# Patient Record
Sex: Male | Born: 1960 | Race: White | Hispanic: No | Smoking: Current every day smoker
Health system: Southern US, Community
[De-identification: ages and names within clinical notes are randomized; demographics above are authoritative.]

## PROBLEM LIST (undated history)

## (undated) DIAGNOSIS — G4733 Obstructive sleep apnea (adult) (pediatric): Secondary | ICD-10-CM

## (undated) DIAGNOSIS — M1990 Unspecified osteoarthritis, unspecified site: Secondary | ICD-10-CM

## (undated) DIAGNOSIS — K5730 Diverticulosis of large intestine without perforation or abscess without bleeding: Secondary | ICD-10-CM

## (undated) DIAGNOSIS — N200 Calculus of kidney: Secondary | ICD-10-CM

## (undated) DIAGNOSIS — D509 Iron deficiency anemia, unspecified: Secondary | ICD-10-CM

## (undated) DIAGNOSIS — H16002 Unspecified corneal ulcer, left eye: Secondary | ICD-10-CM

## (undated) DIAGNOSIS — F419 Anxiety disorder, unspecified: Secondary | ICD-10-CM

## (undated) DIAGNOSIS — F319 Bipolar disorder, unspecified: Secondary | ICD-10-CM

## (undated) DIAGNOSIS — M51369 Other intervertebral disc degeneration, lumbar region without mention of lumbar back pain or lower extremity pain: Secondary | ICD-10-CM

## (undated) DIAGNOSIS — K529 Noninfective gastroenteritis and colitis, unspecified: Secondary | ICD-10-CM

## (undated) DIAGNOSIS — D3501 Benign neoplasm of right adrenal gland: Secondary | ICD-10-CM

## (undated) DIAGNOSIS — F329 Major depressive disorder, single episode, unspecified: Secondary | ICD-10-CM

## (undated) DIAGNOSIS — R933 Abnormal findings on diagnostic imaging of other parts of digestive tract: Secondary | ICD-10-CM

## (undated) DIAGNOSIS — M2550 Pain in unspecified joint: Secondary | ICD-10-CM

## (undated) DIAGNOSIS — K219 Gastro-esophageal reflux disease without esophagitis: Secondary | ICD-10-CM

## (undated) DIAGNOSIS — L732 Hidradenitis suppurativa: Secondary | ICD-10-CM

## (undated) DIAGNOSIS — M5136 Other intervertebral disc degeneration, lumbar region: Secondary | ICD-10-CM

## (undated) DIAGNOSIS — I739 Peripheral vascular disease, unspecified: Secondary | ICD-10-CM

## (undated) DIAGNOSIS — I82409 Acute embolism and thrombosis of unspecified deep veins of unspecified lower extremity: Secondary | ICD-10-CM

## (undated) DIAGNOSIS — Z87898 Personal history of other specified conditions: Secondary | ICD-10-CM

## (undated) DIAGNOSIS — F32A Depression, unspecified: Secondary | ICD-10-CM

## (undated) DIAGNOSIS — J984 Other disorders of lung: Secondary | ICD-10-CM

## (undated) DIAGNOSIS — R350 Frequency of micturition: Secondary | ICD-10-CM

## (undated) DIAGNOSIS — R3915 Urgency of urination: Secondary | ICD-10-CM

## (undated) DIAGNOSIS — Z8042 Family history of malignant neoplasm of prostate: Secondary | ICD-10-CM

## (undated) DIAGNOSIS — M62838 Other muscle spasm: Secondary | ICD-10-CM

## (undated) DIAGNOSIS — J189 Pneumonia, unspecified organism: Secondary | ICD-10-CM

## (undated) DIAGNOSIS — N400 Enlarged prostate without lower urinary tract symptoms: Secondary | ICD-10-CM

## (undated) DIAGNOSIS — C801 Malignant (primary) neoplasm, unspecified: Secondary | ICD-10-CM

## (undated) HISTORY — DX: Abnormal findings on diagnostic imaging of other parts of digestive tract: R93.3

## (undated) HISTORY — DX: Bipolar disorder, unspecified: F31.9

## (undated) HISTORY — DX: Personal history of other specified conditions: Z87.898

## (undated) HISTORY — DX: Obstructive sleep apnea (adult) (pediatric): G47.33

## (undated) HISTORY — PX: ANKLE SURGERY: SHX546

## (undated) HISTORY — DX: Family history of malignant neoplasm of prostate: Z80.42

## (undated) HISTORY — DX: Depression, unspecified: F32.A

## (undated) HISTORY — DX: Unspecified corneal ulcer, left eye: H16.002

## (undated) HISTORY — DX: Other disorders of lung: J98.4

## (undated) HISTORY — PX: TONSILLECTOMY: SUR1361

## (undated) HISTORY — PX: ESOPHAGOGASTRODUODENOSCOPY: SHX1529

## (undated) HISTORY — PX: COLONOSCOPY: SHX174

## (undated) HISTORY — DX: Morbid (severe) obesity due to excess calories: E66.01

## (undated) HISTORY — PX: HYDRADENITIS EXCISION: SHX5243

## (undated) HISTORY — PX: JOINT REPLACEMENT: SHX530

## (undated) HISTORY — DX: Benign prostatic hyperplasia without lower urinary tract symptoms: N40.0

## (undated) HISTORY — DX: Noninfective gastroenteritis and colitis, unspecified: K52.9

## (undated) HISTORY — DX: Hidradenitis suppurativa: L73.2

## (undated) HISTORY — DX: Major depressive disorder, single episode, unspecified: F32.9

## (undated) HISTORY — DX: Anxiety disorder, unspecified: F41.9

## (undated) HISTORY — DX: Benign neoplasm of right adrenal gland: D35.01

## (undated) HISTORY — DX: Diverticulosis of large intestine without perforation or abscess without bleeding: K57.30

## (undated) SURGERY — VIDEO BRONCHOSCOPY WITHOUT FLUORO
Anesthesia: General

---
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
## 1986-06-02 HISTORY — PX: ANTRAL WINDOW: SHX5192

## 1995-06-03 HISTORY — PX: KNEE ARTHROSCOPY: SUR90

## 2001-06-02 HISTORY — PX: ROUX-EN-Y GASTRIC BYPASS: SHX1104

## 2002-06-02 HISTORY — PX: INGUINAL HERNIA REPAIR: SUR1180

## 2003-03-30 ENCOUNTER — Observation Stay (HOSPITAL_COMMUNITY): Admission: EM | Admit: 2003-03-30 | Discharge: 2003-03-31 | Payer: Self-pay | Admitting: Emergency Medicine

## 2004-07-22 ENCOUNTER — Emergency Department: Payer: Self-pay | Admitting: Internal Medicine

## 2005-05-30 ENCOUNTER — Emergency Department: Payer: Self-pay | Admitting: Emergency Medicine

## 2005-06-10 ENCOUNTER — Emergency Department: Payer: Self-pay | Admitting: Internal Medicine

## 2005-07-12 ENCOUNTER — Emergency Department (HOSPITAL_COMMUNITY): Admission: EM | Admit: 2005-07-12 | Discharge: 2005-07-12 | Payer: Self-pay | Admitting: Family Medicine

## 2006-03-09 ENCOUNTER — Emergency Department (HOSPITAL_COMMUNITY): Admission: EM | Admit: 2006-03-09 | Discharge: 2006-03-09 | Payer: Self-pay | Admitting: Emergency Medicine

## 2006-08-08 ENCOUNTER — Emergency Department (HOSPITAL_COMMUNITY): Admission: EM | Admit: 2006-08-08 | Discharge: 2006-08-08 | Payer: Self-pay | Admitting: Emergency Medicine

## 2007-08-10 ENCOUNTER — Ambulatory Visit: Payer: Self-pay | Admitting: Family Medicine

## 2008-07-24 ENCOUNTER — Emergency Department (HOSPITAL_COMMUNITY): Admission: EM | Admit: 2008-07-24 | Discharge: 2008-07-24 | Payer: Self-pay | Admitting: Emergency Medicine

## 2008-07-28 ENCOUNTER — Emergency Department (HOSPITAL_COMMUNITY): Admission: EM | Admit: 2008-07-28 | Discharge: 2008-07-28 | Payer: Self-pay | Admitting: Emergency Medicine

## 2008-07-28 ENCOUNTER — Ambulatory Visit: Payer: Self-pay | Admitting: Family Medicine

## 2008-09-26 ENCOUNTER — Emergency Department (HOSPITAL_COMMUNITY): Admission: EM | Admit: 2008-09-26 | Discharge: 2008-09-26 | Payer: Self-pay | Admitting: Emergency Medicine

## 2008-10-18 ENCOUNTER — Emergency Department (HOSPITAL_COMMUNITY): Admission: EM | Admit: 2008-10-18 | Discharge: 2008-10-18 | Payer: Self-pay | Admitting: Family Medicine

## 2010-09-11 LAB — POCT URINALYSIS DIP (DEVICE)
Bilirubin Urine: NEGATIVE
Hgb urine dipstick: NEGATIVE
Ketones, ur: NEGATIVE mg/dL
Protein, ur: NEGATIVE mg/dL
Specific Gravity, Urine: <=1.005 (ref 1.005–1.030)
pH: 6 (ref 5.0–8.0)

## 2010-10-18 ENCOUNTER — Emergency Department (HOSPITAL_COMMUNITY)
Admission: EM | Admit: 2010-10-18 | Discharge: 2010-10-18 | Disposition: A | Discharge status: Home / Self Care | Payer: Managed Care, Other (non HMO) | Attending: Emergency Medicine | Admitting: Emergency Medicine

## 2010-10-18 DIAGNOSIS — Y838 Other surgical procedures as the cause of abnormal reaction of the patient, or of later complication, without mention of misadventure at the time of the procedure: Secondary | ICD-10-CM | POA: Insufficient documentation

## 2010-10-18 DIAGNOSIS — IMO0002 Reserved for concepts with insufficient information to code with codable children: Secondary | ICD-10-CM | POA: Insufficient documentation

## 2010-10-18 LAB — CBC
HCT: 35.1 % — ABNORMAL LOW (ref 39.0–52.0)
Hemoglobin: 11.7 g/dL — ABNORMAL LOW (ref 13.0–17.0)
Hemoglobin: 10.9 g/dL — ABNORMAL LOW (ref 13.0–17.0)
MCH: 30.4 pg (ref 26.0–34.0)
MCHC: 33.3 g/dL (ref 30.0–36.0)
MCHC: 34.1 g/dL (ref 30.0–36.0)
MCV: 90.2 fL (ref 78.0–100.0)
MCV: 89.4 fL (ref 78.0–100.0)
Platelets: 231 10*3/uL (ref 150–400)
RBC: 3.58 MIL/uL — ABNORMAL LOW (ref 4.22–5.81)
RDW: 13.7 % (ref 11.5–15.5)

## 2010-10-18 LAB — DIFFERENTIAL
Basophils Absolute: 0 10*3/uL (ref 0.0–0.1)
Eosinophils Relative: 2 % (ref 0–5)
Lymphocytes Relative: 23 % (ref 12–46)
Lymphs Abs: 1.4 10*3/uL (ref 0.7–4.0)
Monocytes Absolute: 0.5 10*3/uL (ref 0.1–1.0)
Monocytes Relative: 7 % (ref 3–12)
Neutro Abs: 4.1 10*3/uL (ref 1.7–7.7)

## 2010-10-18 NOTE — H&P (Signed)
NAME:  [name], [name]                              ACCOUNT NO.:  [id number]   MEDICAL RECORD NO.:  [id number]                   PATIENT TYPE:  EMS   LOCATION:  ED                                   FACILITY:  [facility]   PHYSICIAN:  Angelia Mould. Derrell Lolling, M.D.             DATE OF BIRTH:  [birth year]   DATE OF ADMISSION:  03/30/2003  DATE OF DISCHARGE:                                HISTORY & PHYSICAL   CHIEF COMPLAINT:  Painful left groin mass.   HISTORY OF PRESENT ILLNESS:  This is a 50 year old white male who has  noticed left groin pain for three months.  He noted a small mass there but  thinks that this has gotten larger.  He has been able to eat and have bowel  movements.  He has not had any evidence of obstruction or nausea.  He was  recently treated for a urinary tract infection.  He saw Dr. Ihor Gully  yesterday in the office, who diagnosed a left groin hernia.  The patient was  scheduled to see Dr. Ginette Pitman in the office this morning.  Dr. Maryagnes Amos saw  the patient and felt that he had a tender incarcerated left inguinal hernia  that needed urgent repair and the patient was sent to [facility]  for me to perform the surgery.   The patient has no prior history of hernia or hernia surgery.  He has been  voiding okay.  He denies any pain in the scrotum or testes.   CURRENT MEDICATIONS:  Multivitamins and Cipro.   ALLERGIES:  SULFA.   PAST MEDICAL HISTORY:  1. Gastric bypass for obesity at [facility] in July of 2003.  He has lost about     200 pounds.  2. He has had hydradenitis surgery of the perineum.  3. He has had fracture above his left ankle and has a plate and a bone graft     there.  4. He has had sinus window surgery.  5. He has had knee arthroscopy.   FAMILY HISTORY:  Father living, age 58, has a pacemaker, is otherwise doing  very well.  Mother died following a long siege with Alzheimer's disease, due  to pneumonia.  One bother and one sister living and well.  No  family history  of cancer.  Myocardial infarction in the grandfather.   SOCIAL HISTORY:  The patient lives in [place], [place].  He is  married.  He has one step-son.  He smokes one pack of cigarettes per day,  drinks alcohol rarely.  He works at [employer] as a Designer, multimedia.   REVIEW OF SYMPTOMS:  All systems reviewed.  They are noncontributory except  as described above.   PHYSICAL EXAMINATION:  GENERAL APPEARANCE:  A pleasant, healthy-appearing  middle-aged man in mild distress.  VITAL SIGNS:  Temperature 96.4, pulse 58, respiratory rate 18, blood  pressure 125/83.  HEENT:  Eyes:  Sclerae are clear.  Extraocular movements intact.  Ears,  nose, mouth and throat:  Nose, lips, tongue and oropharynx without gross  lesions.  NECK:  Supple, nontender, no adenopathy. No jugular venous distension.  No  thyroid mass.  RESPIRATORY:  Lungs clear to auscultation, no chest wall tenderness.  CARDIOVASCULAR:  Heart regular rate and rhythm with no murmur.  Radial,  femoral, and posterior tibial pulses are palpable.  No peripheral edema.  ABDOMEN:  Scaphoid, soft, nontender.  Liver and spleen not enlarged.  Well-  healed trocar sites.  GENITOURINARY:  The patient has about a 5 cm painful nonreducible mass in  the left inguinal area.  This is fairly fixed and does not extend into the  scrotum.  It is tender. There is no overlying skin change or inflammation.  There is no evidence of mass on the right.  Penis, scrotum and testes feel  normal.  EXTREMITIES:  He moves all four extremities well without deformity or pain.  NEUROLOGIC:  No gross motor or sensory deficits.   ASSESSMENT:  1. Incarcerated left inguinal hernia. Urgent exploration is warranted to     rule out and/or prevent strangulation.  2. Bariatric surgery, gastric bypass with good result to date.   PLAN:  The patient will be taken to the operating room for left groin  exploration under general anesthesia  today.  I have discussed the  indications and details of the surgery with the patient and with his wife.  Risks and complications have been outlined, including but not limited to  bleeding, infection, injury to the intestinal tract necessitating resection,  nerve damage with chronic pain, injury to the testicle, recurrence of the  hernia, cardiac, pulmonary, and thromboembolic problems, and other  unforeseen problems.  He seems to understand these issues well.  At this  time, all of his questions are answered.  He would like to go ahead and have  this done as soon as possible.                                                Angelia Mould. Derrell Lolling, M.D.    HMI/MEDQ  D:  03/30/2003  T:  03/30/2003  Job:  [id number]   cc:   Donia Guiles, M.D.  [address] [place]  [place] [address]  Fax: [phone]

## 2010-10-18 NOTE — Op Note (Signed)
NAME:  KARSYN, ROCHIN                              ACCOUNT NO.:  [id number]   MEDICAL RECORD NO.:  [id number]                   PATIENT TYPE:  EMS   LOCATION:  ED                                   FACILITY:  [facility]   PHYSICIAN:  Angelia Mould. Derrell Lolling, M.D.             DATE OF BIRTH:  May 25, 1961   DATE OF PROCEDURE:  03/30/2003  DATE OF DISCHARGE:                                 OPERATIVE REPORT   PREOPERATIVE DIAGNOSIS:  Incarcerated left inguinal hernia.   POSTOPERATIVE DIAGNOSIS:  Incarcerated indirect left inguinal hernia.   OPERATION PERFORMED:  Repair of incarcerated left inguinal hernia with mesh  Armanda Heritage repair).   SURGEON:  Angelia Mould. Derrell Lolling, M.D.   OPERATIVE INDICATION:  This is a 50 year old white man who has a three-month  history of a painful small lump in his left groin.  Over the past 24-48  hours the lump had gotten larger and much more painful.  He has had minimal  nausea but really no vomiting, no change in his bowel habits, and no  abdominal pain.  On exam he has a 4-5 cm tender palpable mass in his left  inguinal area that is not reducible.  There is no overlying skin change.  His abdomen is soft and benign.  He is brought to the operating room  urgently for repair of his presumed incarcerated left inguinal hernia.   OPERATIVE FINDINGS:  The patient had an incarcerated indirect left inguinal  hernia.  The sigmoid colon was in the hernia sac.  When the patient was put  to sleep, the incarcerated hernia reduced for the most part.  There was no  bloody fluid, no foul odor, no signs of any exudate.  The patient had lost a  great deal of weight since his gastric bypass surgery one year ago, making  the tissues very redundant and lax.   OPERATIVE TECHNIQUE:  Following the induction of general endotracheal  anesthesia, the patient's abdomen and genitalia were prepped and draped in a  sterile fashion.  Marcaine 0.5% with epinephrine was used as a local  infiltration  anesthetic.  An oblique incision was made in the left groin  overlying the inguinal canal.  Dissection was carried down through the  subcutaneous tissue.  We identified the external oblique and then dissected  medially and laterally until we clearly identified the external inguinal  ring.  The external oblique aponeurosis was incised in the direction of its  fibers, opening up the external inguinal ring.  The external oblique was  then dissected away from the underlying tissues and self-retaining  retractors were placed.  The ilioinguinal nerve was isolated.  It was  intimately associated with the cord structures.  We dissected it off of the  cord structures all the way back to and above the level of the internal  ring.  I clamped the ilioinguinal nerve and divided it  and ligated the stump  of the ilioinguinal nerve with a 2-0 silk tie.   We mobilized and encircled the cord structures with a Penrose drain.  This  appeared to be a chronic indirect hernia with lots of scar tissue.  We  carefully dissected the indirect sac away from the cord structures and then  opened the indirect sac and inspected it.  We found some omentum and sigmoid  colon at the bottom of this sac.  We pulled this up and took all of the  adhesions down so that it would completely reduce.  The colon looked  perfectly healthy.  There was no sign of bruising or ischemia.  There was no  bloody fluid or other problems.  Once we took all the adhesions down, we  were able to reduce the sigmoid colon and the omentum out of the indirect  sac.  The indirect sac was directed all the way back to the internal ring  and the sac was closed with a pursestring suture of 2-0 silk.  The redundant  sac was excised.   The floor of the inguinal canal was reinforced and repaired with an onlay  graft of polypropylene mesh.  A 4 x 6 inch piece of mesh was brought to the  operative field and then trimmed at the corners to fit the anatomy.   The  mesh was sutured in place with running sutures of 2-0 Prolene.  The mesh was  sutured so as to generously overlap the fascia at the pubic tubercle, along  the inguinal ligament inferiorly, and along the internal oblique and  conjoined tendon superiorly.  I placed a few interrupted sutures of Prolene  as well medially and superiorly.  The mesh was incised laterally so as to  wrap around the cord structures at the internal ring.  The tails of the mesh  were overlapped laterally, and the suture line was completed.  A couple of  extra Prolene sutures were placed to tighten up the mesh around the cord.  This created a very secure repair both medial and lateral to the internal  ring but allowed an adequate opening for the cord structures.  The wounds  were irrigated with saline.  Hemostasis was excellent.  The external oblique  was closed with a running suture of 2-0 Vicryl, placing the cord structures  deep to the external oblique.  Scarpa's fascia was closed with a running  suture of 3-0 Vicryl and the skin closed with skin staples.  Clean bandages  were placed and the patient taken to the recovery room in stable condition.  Estimated blood loss was about 20 mL.  Complications:  None.  Sponge,  needle, and instrument counts were correct.                                               Angelia Mould. Derrell Lolling, M.D.    HMI/MEDQ  D:  03/30/2003  T:  03/30/2003  Job:  [id number]   cc:   Donia Guiles, M.D.  [address] [place]  [place] [address]  Fax: [phone]

## 2010-11-13 NOTE — Consult Note (Signed)
  [name], [name]                    ACCOUNT NO.:  [id number]  MEDICAL RECORD NO.:  [id number]           PATIENT TYPE:  E  LOCATION:  WLED                         FACILITY:  [facility]  PHYSICIAN:  Thornton Park. Daphine Deutscher, MD  DATE OF BIRTH:  June 02, 1961  DATE OF CONSULTATION:  10/18/2010 DATE OF DISCHARGE:                                CONSULTATION   CHIEF COMPLAINT:  Bleeding from medial thigh, hidradenitis, excision from earlier in the day in the [facility] office per Dr. Zachery Dakins.  [name] came to the ED with bleeding coming from his perineum.  He had saturated several dressings.  I was in the operating room, I came over thereafter and exposed the area and injected with some of 1% lidocaine with epinephrine.  I could see an arterial pump medially.  I put figure- of-eight suture in that and that controlled the pumping.  There was another little bleeder, I put 3-0 Vicryl in this and bleeding seem to abate.  We are going to check a CBC on him to see how much his hemoglobin is down, give him some fluids and probably let him go home after he has been shown to not bleed.  IMPRESSION:  Bleeding after office procedure whereby his hidradenitis was excised and wound left open.  PLAN:  Follow up with Dr. Zachery Dakins in the office.     Thornton Park Daphine Deutscher, MD     MBM/MEDQ  D:  10/18/2010  T:  10/18/2010  Job:  [id number]  Electronically Signed by Luretha Murphy MD on 11/13/2010 04:30:41 PM

## 2010-11-21 ENCOUNTER — Encounter (INDEPENDENT_AMBULATORY_CARE_PROVIDER_SITE_OTHER): Payer: Self-pay | Admitting: General Surgery

## 2010-11-21 DIAGNOSIS — N400 Enlarged prostate without lower urinary tract symptoms: Secondary | ICD-10-CM | POA: Insufficient documentation

## 2010-11-21 DIAGNOSIS — M1990 Unspecified osteoarthritis, unspecified site: Secondary | ICD-10-CM | POA: Insufficient documentation

## 2011-01-09 ENCOUNTER — Encounter (INDEPENDENT_AMBULATORY_CARE_PROVIDER_SITE_OTHER): Payer: Self-pay | Admitting: General Surgery

## 2011-01-10 ENCOUNTER — Encounter (INDEPENDENT_AMBULATORY_CARE_PROVIDER_SITE_OTHER): Payer: Self-pay | Admitting: General Surgery

## 2011-01-10 ENCOUNTER — Ambulatory Visit (INDEPENDENT_AMBULATORY_CARE_PROVIDER_SITE_OTHER): Payer: Managed Care, Other (non HMO) | Admitting: General Surgery

## 2011-01-10 VITALS — BP 118/74 | HR 100 | Temp 97.0°F | Ht 75.0 in | Wt 256.6 lb

## 2011-01-10 DIAGNOSIS — L732 Hidradenitis suppurativa: Secondary | ICD-10-CM

## 2011-01-10 NOTE — Progress Notes (Signed)
Subjective:     Patient ID: [name], male   DOB: [birth year], 50 y.o.   MRN: [id number]  HPIThe patient returns now proximally 2 months following excision of a chronic area of hidradenitis that had been operated on previously multiple occasions by Dr. Shon Hale. The area has healed in spite of him having immediate bleed and after the surgery which required an ER visit seen by Dr. Loraine Leriche the other areas of chronic low-grade R. improve but the area on his abdominal wall where his belt rub he would like to have excised the view of the areas in the groin and upper thigh I see no evidence of any active infection today   Review of Systems     Objective:   Physical ExamI would recommend that we plan on excising the other areas that he desires excise in the operating room with general anesthesia as the areas or large and I will not be able to get good stasis would've a minor office procedure the area of question measures approximately 6 x 4 cm and is not actively infected today and sure the area would be better if removed and see high at his belt certainly irritates the area he will return in approximate 6 weeks and we will set him of 4 days surgical procedure as an outpatient at [facility] we'll     Assessment:    Improved but chronic areas of recurrent hidradenitis we'll plan excision of involved area was electively in the future     Plan:     Return visit in approximately 6 weeks for preoperative assessment and schedule an

## 2011-01-10 NOTE — Patient Instructions (Signed)
See me 6 wk and we can schedule OR time for excisions of these areas

## 2011-02-20 ENCOUNTER — Encounter (INDEPENDENT_AMBULATORY_CARE_PROVIDER_SITE_OTHER): Payer: Self-pay | Admitting: General Surgery

## 2011-02-21 ENCOUNTER — Encounter (INDEPENDENT_AMBULATORY_CARE_PROVIDER_SITE_OTHER): Payer: Self-pay | Admitting: General Surgery

## 2011-02-21 ENCOUNTER — Other Ambulatory Visit (INDEPENDENT_AMBULATORY_CARE_PROVIDER_SITE_OTHER): Payer: Self-pay | Admitting: General Surgery

## 2011-02-21 ENCOUNTER — Ambulatory Visit (INDEPENDENT_AMBULATORY_CARE_PROVIDER_SITE_OTHER): Payer: Managed Care, Other (non HMO) | Admitting: General Surgery

## 2011-02-21 ENCOUNTER — Ambulatory Visit
Admission: RE | Admit: 2011-02-21 | Discharge: 2011-02-21 | Disposition: A | Discharge status: Home / Self Care | Payer: Managed Care, Other (non HMO) | Source: Ambulatory Visit | Attending: General Surgery | Admitting: General Surgery

## 2011-02-21 VITALS — BP 142/96 | HR 66 | Temp 97.3°F | Resp 16 | Ht 74.0 in | Wt 260.4 lb

## 2011-02-21 DIAGNOSIS — L732 Hidradenitis suppurativa: Secondary | ICD-10-CM

## 2011-02-21 DIAGNOSIS — Z01811 Encounter for preprocedural respiratory examination: Secondary | ICD-10-CM

## 2011-02-21 LAB — CBC
MCH: 26.8 pg (ref 26.0–34.0)
MCV: 82.9 fL (ref 78.0–100.0)
Platelets: 251 10*3/uL (ref 150–400)
RDW: 17.5 % — ABNORMAL HIGH (ref 11.5–15.5)

## 2011-02-21 LAB — DIFFERENTIAL
Basophils Relative: 0 % (ref 0–1)
Eosinophils Absolute: 0.2 10*3/uL (ref 0.0–0.7)
Eosinophils Relative: 4 % (ref 0–5)
Lymphs Abs: 1.3 10*3/uL (ref 0.7–4.0)
Monocytes Relative: 11 % (ref 3–12)
Neutrophils Relative %: 57 % (ref 43–77)

## 2011-02-21 LAB — BASIC METABOLIC PANEL
Calcium: 9 mg/dL (ref 8.4–10.5)
Creatinine, Ser: 0.77 mg/dL (ref 0.50–1.35)
GFR calc Af Amer: >60 mL/min (ref >60)
GFR calc non Af Amer: >60 mL/min (ref >60)
Sodium: 139 mEq/L (ref 135–145)

## 2011-02-21 NOTE — Progress Notes (Signed)
Subjective:     Patient ID: [name], male   DOB: 1960-06-27, 50 y.o.   MRN: [id number]  HPIIs to returns is now approximately 3 months since I excised a large area in the right. Anal left thigh area of chronic hidradenitis healed nicely but he had problems with postoperative bleed in. He has an area on the right lateral abdominal wall for his belt rubs its approximately 4" x 3" trapezoid area that he would like to have removed and I stated I would do it but I would need to do it in the operating room where general anesthesia. He used in agreement with this he works third shift and ligated done on Friday but it appears that probably don't have any other Friday surgery is available but we couldn't get the surgery schedule for Thursday. He denies any chronic current medical problems and doesn't remember when he had his last EKG and chest x-ray. The patient is married and his wife will need to be with him at the outpatient surgery since he will not be able to drive for the first 24 hour   Review of Systems Current Outpatient Prescriptions  Medication Sig Dispense Refill  . buPROPion (WELLBUTRIN SR) 200 MG 12 hr tablet Take 450 mg by mouth daily. Confirm medication type with patient.       . carbamazepine (EPITOL) 200 MG tablet Take 800 mg by mouth daily.        Marland Kitchen ibuprofen (ADVIL,MOTRIN) 800 MG tablet Take 800 mg by mouth 3 (three) times daily.        Marland Kitchen lamoTRIgine (LAMICTAL) 200 MG tablet Take 400 mg by mouth daily.        . temazepam (RESTORIL) 15 MG capsule 2 (two) times daily.       Marland Kitchen HYDROcodone-acetaminophen (VICODIN ES) 7.5-750 MG per tablet       . HYDROcodone-homatropine (HYCODAN) 5-1.5 MG/5ML syrup       . metaxalone (SKELAXIN) 800 MG tablet       . sulfamethoxazole-trimethoprim (BACTRIM DS) 800-160 MG per tablet        Past Surgical History  Procedure Date  . Hernia repair 2004  . Gastric bypass 2003  . Knee arthroscopy 1997   Allergies  Allergen Reactions  . Sulfa Antibiotics     Ask patient to clarify severity and reaction. Not indicated on history form dated 10/18/10.   Patient is a cigarette smoker and is not interested in trying to stop otherwise he doesn't have any active medical problems that he is aware of.Marland Kitchen He is allergic to sulfur and says Cipro works at best to leave a put him on Keflex or Cipro for approximately a week after surgery.     Objective:   Physical ExamBP 142/96  Pulse 66  Temp 97.3 F (36.3 C)  Resp 16  Ht 6\' 2"  (1.88 m)  Wt 260 lb 6 oz (118.105 kg)  BMI 33.43 kg/m2 Patient returns he looks good the area in the perirectal right thigh area of where I excised previously looks good the area on the abdominal wall radius had this chronic infection like to have removed we took a picture of enhancement 3 x 4" area. Hopefully I can go to get the area closed at the operative time to speed up the healing. He likely Mrs. little work as possible so we'll try to schedule tomorrow Thursday Saturday to have a time to recuperate and is only one therefore  HEENT negative lungs clear cardiac normal sounds  rhythm abdomen soft nontender no organomegaly no hernias noted did not do a rectal exam at the perianal groin area of hidradenitis is markedly improved the past and all     Assessment:       Chronic hidradenitis plan excision as an outpatient with general anesthesia sometime in the future at [facility]. Patient will need a chest x-ray and EKG minimal labs and we'll give him a prescription of Keflex which he'll start probably a couple of days prior to the surgery. Plan:

## 2011-02-21 NOTE — Patient Instructions (Signed)
Scheduled surgery and start the Keflex q.i.d. approximately 2 days prior to the surgery and we'll continue it after surgery for approximately one week

## 2011-02-24 ENCOUNTER — Telehealth (INDEPENDENT_AMBULATORY_CARE_PROVIDER_SITE_OTHER): Payer: Self-pay | Admitting: General Surgery

## 2011-02-24 NOTE — Telephone Encounter (Signed)
Patient scheduled for excision of hidradenitis on 02/27/11. Was informed to stay off ibuprofen prior to surgery. Patient takes Ibuprofen everyday for back pain, states tylenol does not help with this pain. Patient is asking for hydrocodone rx to take for back pain prior to surgery so that he can be off blood thinners. Please advise.

## 2011-02-25 ENCOUNTER — Telehealth (INDEPENDENT_AMBULATORY_CARE_PROVIDER_SITE_OTHER): Payer: Self-pay

## 2011-02-25 DIAGNOSIS — L732 Hidradenitis suppurativa: Secondary | ICD-10-CM

## 2011-02-25 MED ORDER — HYDROCODONE-ACETAMINOPHEN 5-325 MG PO TABS: 1.0000 | ORAL_TABLET | Freq: Four times a day (QID) | ORAL | Status: AC | PRN

## 2011-02-25 NOTE — Telephone Encounter (Signed)
Chart and note to Dr.Weatherly to review 02/24/2011

## 2011-02-25 NOTE — Telephone Encounter (Signed)
Patient scheduled for surgery on 02/27/11, he's currently taking ibuprofen but need's to hold this medication prior to surgery, Dr. Zachery Dakins has prescribed Vicodin 5/325mg , 1 po q 6hrs prn pain, #30, 0 refills.  Called in to [facility]

## 2011-02-27 ENCOUNTER — Ambulatory Visit (HOSPITAL_BASED_OUTPATIENT_CLINIC_OR_DEPARTMENT_OTHER)
Admission: RE | Admit: 2011-02-27 | Discharge: 2011-02-27 | Disposition: A | Discharge status: Home / Self Care | Payer: Managed Care, Other (non HMO) | Source: Ambulatory Visit | Attending: General Surgery | Admitting: General Surgery

## 2011-02-27 ENCOUNTER — Other Ambulatory Visit (INDEPENDENT_AMBULATORY_CARE_PROVIDER_SITE_OTHER): Payer: Self-pay | Admitting: General Surgery

## 2011-02-27 DIAGNOSIS — L732 Hidradenitis suppurativa: Secondary | ICD-10-CM

## 2011-02-27 DIAGNOSIS — L905 Scar conditions and fibrosis of skin: Secondary | ICD-10-CM | POA: Insufficient documentation

## 2011-02-27 DIAGNOSIS — Z9884 Bariatric surgery status: Secondary | ICD-10-CM | POA: Insufficient documentation

## 2011-02-27 DIAGNOSIS — Z79899 Other long term (current) drug therapy: Secondary | ICD-10-CM | POA: Insufficient documentation

## 2011-02-27 HISTORY — PX: UMBILICAL HIDRADENITIS EXCISION: SHX2599

## 2011-02-27 LAB — POCT HEMOGLOBIN-HEMACUE: Hemoglobin: 9.7 g/dL — ABNORMAL LOW (ref 13.0–17.0)

## 2011-03-07 ENCOUNTER — Encounter (INDEPENDENT_AMBULATORY_CARE_PROVIDER_SITE_OTHER): Payer: Self-pay | Admitting: General Surgery

## 2011-03-07 ENCOUNTER — Ambulatory Visit (INDEPENDENT_AMBULATORY_CARE_PROVIDER_SITE_OTHER): Payer: Managed Care, Other (non HMO) | Admitting: General Surgery

## 2011-03-07 VITALS — BP 148/90 | HR 64 | Temp 97.4°F | Resp 16 | Ht 74.0 in | Wt 262.0 lb

## 2011-03-07 DIAGNOSIS — L732 Hidradenitis suppurativa: Secondary | ICD-10-CM

## 2011-03-07 NOTE — Patient Instructions (Signed)
Tried to avoid direct pressure on the incision and keep Steri-Strips on for the next 2 weeks. Sutures to be removed in 10-14 days he may shower at this time

## 2011-03-07 NOTE — Progress Notes (Signed)
Subjective:     Patient ID: [name], male   DOB: [birth year], 50 y.o.   MRN: [id number]  HPIFull returns he is now 8 days following excision of this large area of chronic skin infections and at the right lateral pelvic line area consistent with chronic hidradenitis I did a primary closure were no drains he has been on Keflex and on examination today there is no evidence of any serum reaccumulation or inflammation of the skin he's got sutures plus Steri-Strips are replaced the Steri-Strips it would like to leave the sutures in a least additional we T. since Friday as the best time for his followup visits causing his work schedule and I'm not hear nitrites will plan on seeing him in 2 week urine he will keep the Steri-Strips on the skin lesions and trying to keep his belt "pants are not rubbing in the area   Review of Systems     Objective:   Physical Exam BP 148/90  Pulse 64  Temp(Src) 97.4 F (36.3 C) (Temporal)  Resp 16  Ht 6\' 2"  (1.88 m)  Wt 262 lb (118.842 kg)  BMI 33.64 kg/m2    Incision is healing nicely no erythema or wound problem and he still keepin protective cover dressing Assessment:         Plan:       Suture removed in 10-14 and keep Steri-Strips on the incision

## 2011-03-19 NOTE — Op Note (Signed)
NAMEHUSAM, HOHN NO.:  [id number]  MEDICAL RECORD NO.:  [id number]  LOCATION:                                 FACILITY:  PHYSICIAN:  Anselm Pancoast. Cailan Antonucci, M.D.DATE OF BIRTH:  1960/06/27  DATE OF PROCEDURE:  02/27/2011 DATE OF DISCHARGE:                              OPERATIVE REPORT   PREOPERATIVE DIAGNOSIS:  Chronic hidradenitis, right lateral abdominal wall.  OPERATION:  Excision and primary closure area of hidradenitis, right lateral abdominal wall.  ANESTHESIA:  General anesthesia.  HISTORY:  [name] is a 50 year old male who previously had been a patient of Dr. Jerelene [name] and then had problems with multiple areas of hidradenitis in the groin, inner thigh areas, area of the abdomen that is close to where his belt buckle would rub  and I first met him approximately 6-7 months ago when he had some areas in his groin that were recurrent infection and I recommended that we excise the area and I did this in the office.  He did have some areas of bleeding right afterwards.  The wound had been kind of packed and then he has had an area of the right lateral abdominal wall, an area about 3 x 5 inches in size that has been chronically I and D'd and it is not only painful, intermittently infected and he wanted to have that excised.  I recommended that, that be done in the operating room with general anesthesia because of the size and he is here for the planned procedure. He has not got any obvious active infection now and we will plan on excising the area and we will hopefully not have to place any drains postoperatively.  He is off work until Monday evening next week.  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  The patient was taken to the operative suite, induction of general anesthesia with LMA tube.  The abdomen was prepped with Betadine surgical solution, draped in a sterile manner.  I had marked the right side.  The time-out was  completed appropriately, he was given 2 g of Ancef preoperatively because of the size and then I elected to ellipse out this area and the area being excised was about 3 x 5 inches, but if you do the incision kind of obliquely, I thought we could get the area together without any excessive tension.  He got a little bit of panniculus because of his obesity, but there are not any significant areas of hidradenitis over on the left side.  The area was marked and then I used a 15 blade to kind of score the skin, so I could feel we are doing a mastectomy and then used skin hooks to kind of elevate the skin, but I was very cautious on trying to raise any significant flaps, but completely excised the area including the immediate underlying skin. This was done, several little areas not only required coagulation but a few little subcuticular arterial bleeders required suturing with 4-0 Vicryl and we then after the area was completely excised, marked it with a long stitch laterally.  I think this will all be benign, but  then elevated the full thickness kind of along the Scarpa fascia layer superiorly and inferiorly, so I could advance those areas together to kind of remove any excessive tension on the skin.  This was done with interrupted sutures of 4-0 Vicryl.  I then used a 4-0 Monocryl to do subcuticular closure and then put some interrupted even 2, 3s or 4-0 nylon sutures and then Benzoin and Steri-Strips on the skin.  The patient tolerated the procedure nicely.  The estimated blood loss was minimal and he will be released after a short stay in the recovery room.  I will keep him on Keflex for approximately a good week and will see him back in the office in the later part of next week.  He will continue on all his chronic medications plus the Keflex.     Anselm Pancoast. Zachery Dakins, M.D.     WJW/MEDQ  D:  02/27/2011  T:  02/28/2011  Job:  [id number]  Electronically Signed by Consuello Bossier M.D.  on 03/19/2011 08:51:54 AM

## 2011-03-20 ENCOUNTER — Encounter (INDEPENDENT_AMBULATORY_CARE_PROVIDER_SITE_OTHER): Payer: Self-pay | Admitting: General Surgery

## 2011-03-21 ENCOUNTER — Encounter (INDEPENDENT_AMBULATORY_CARE_PROVIDER_SITE_OTHER): Payer: Self-pay | Admitting: General Surgery

## 2011-03-21 ENCOUNTER — Ambulatory Visit (INDEPENDENT_AMBULATORY_CARE_PROVIDER_SITE_OTHER): Payer: Managed Care, Other (non HMO) | Admitting: General Surgery

## 2011-03-21 VITALS — BP 146/94 | HR 60 | Temp 97.2°F | Resp 20 | Ht 74.0 in | Wt 262.0 lb

## 2011-03-21 DIAGNOSIS — L732 Hidradenitis suppurativa: Secondary | ICD-10-CM

## 2011-03-21 NOTE — Patient Instructions (Signed)
The Steri-Strips on for at least one week and see [place] on a p.r.n. There is no restrictions in your one-year activity at work

## 2011-03-21 NOTE — Progress Notes (Signed)
Subjective:     Patient ID: [name], male   DOB: 12/02/1960, 50 y.o.   MRN: [id number]  HPIMr. [name] returned now approximately 5-6 weeks following excision of a large area head rhinitis on the right lateral abdominal wall the incision appears to have healed nicely I removed approximately 2/3 of the sutures previously and had Steri-Strips in place I removed the remaining sutures today the wound has no evidence of any infection chronic irritation area and he has been back to work on a limited basis. He states he still have a distal little bit of pain intermittently for which she's taken off the Vicodin otherwise he is doing fine he wants to return to work with no restrictions and I think that would be fine.  Review of Systems    no change Objective:   Physical Exam BP 146/94  Pulse 60  Temp 97.2 F (36.2 C)  Resp 20  Ht 6\' 2"  (1.88 m)  Wt 262 lb (118.842 kg)  BMI 33.64 kg/m2    List of 40 incision appears to be healed nicely and is still a few remaining sutures are removed and I placed Steri-Strips and gave him actually another packs or he can reapply him if they come off that for the next 7-10 days. He is given a return to work slipped with no restrictions he has been work a limited basis. He was also given a prescription of Vicodin with no refillsAssessment:    Satisfactory removal of a large area had gotten on his abdominal wall he does not have any active areas of chronic infection at this time in the perineal wall buttocks area. He will see me on a p.r.n. basis and he is aware that on retiring at the end of December    Plan:     See note above

## 2011-04-04 LAB — HM COLONOSCOPY

## 2011-04-14 ENCOUNTER — Telehealth (INDEPENDENT_AMBULATORY_CARE_PROVIDER_SITE_OTHER): Payer: Self-pay

## 2011-04-14 NOTE — Telephone Encounter (Signed)
Patient made appointment with front desk on the Nov 30 to drain a couple spots of hydradenitis. He said they are draining a little and seem to be getting infected. He was wanting to see if some antibiotics could be called in so he can have infection cleared out before his appt.

## 2011-04-15 ENCOUNTER — Telehealth (INDEPENDENT_AMBULATORY_CARE_PROVIDER_SITE_OTHER): Payer: Self-pay

## 2011-04-15 NOTE — Telephone Encounter (Signed)
2 previous sites draining on opposite surgery side.  Ok to call in Keflex 500 mg 1 po qid to [phone] per Dr. Zachery Dakins. Patient advised to call back if condition worsens before appointment date. Message left on [facility] voicemail- patient aware.

## 2011-05-02 ENCOUNTER — Encounter (INDEPENDENT_AMBULATORY_CARE_PROVIDER_SITE_OTHER): Payer: Managed Care, Other (non HMO) | Admitting: General Surgery

## 2011-05-05 ENCOUNTER — Ambulatory Visit (INDEPENDENT_AMBULATORY_CARE_PROVIDER_SITE_OTHER): Payer: Managed Care, Other (non HMO) | Admitting: Surgery

## 2011-05-09 ENCOUNTER — Ambulatory Visit (INDEPENDENT_AMBULATORY_CARE_PROVIDER_SITE_OTHER): Payer: Managed Care, Other (non HMO) | Admitting: Surgery

## 2011-05-09 ENCOUNTER — Encounter (INDEPENDENT_AMBULATORY_CARE_PROVIDER_SITE_OTHER): Payer: Self-pay | Admitting: Surgery

## 2011-05-09 VITALS — BP 148/82 | HR 70 | Temp 97.6°F | Resp 18 | Ht 75.0 in | Wt 267.5 lb

## 2011-05-09 DIAGNOSIS — L732 Hidradenitis suppurativa: Secondary | ICD-10-CM

## 2011-05-09 NOTE — Progress Notes (Signed)
[name] comes in today with hidradenitis of his proximal in her left thigh. He's got some firm areas are not draining he wanted it excised. I offered to do this under general at current a surgery as an outpatient. I felt like it would give him optimal opportunity for an excision and primary closure. He indicated Dr. Kym Groom and Dr. Nunzio Cory and always done this for him under local in the office and that's what he wanted done. He declines my services were therefore there is no charge.

## 2011-08-28 ENCOUNTER — Other Ambulatory Visit: Payer: Self-pay | Admitting: Family Medicine

## 2012-06-02 HISTORY — PX: FOOT SURGERY: SHX648

## 2012-09-29 DIAGNOSIS — M21629 Bunionette of unspecified foot: Secondary | ICD-10-CM | POA: Insufficient documentation

## 2012-09-29 DIAGNOSIS — M2040 Other hammer toe(s) (acquired), unspecified foot: Secondary | ICD-10-CM | POA: Insufficient documentation

## 2012-09-29 DIAGNOSIS — M2010 Hallux valgus (acquired), unspecified foot: Secondary | ICD-10-CM | POA: Insufficient documentation

## 2012-10-19 ENCOUNTER — Encounter: Payer: Self-pay | Admitting: Neurology

## 2012-10-28 ENCOUNTER — Encounter: Payer: Self-pay | Admitting: Neurology

## 2012-10-28 ENCOUNTER — Institutional Professional Consult (permissible substitution): Payer: Self-pay | Admitting: Neurology

## 2012-11-01 ENCOUNTER — Ambulatory Visit: Payer: Self-pay | Admitting: Neurology

## 2012-11-01 ENCOUNTER — Encounter: Payer: Self-pay | Admitting: Neurology

## 2012-11-01 ENCOUNTER — Ambulatory Visit (INDEPENDENT_AMBULATORY_CARE_PROVIDER_SITE_OTHER): Payer: Managed Care, Other (non HMO) | Admitting: Neurology

## 2012-11-01 VITALS — BP 119/80 | HR 70 | Temp 98.7°F | Ht 74.5 in | Wt 286.0 lb

## 2012-11-01 DIAGNOSIS — G4733 Obstructive sleep apnea (adult) (pediatric): Secondary | ICD-10-CM

## 2012-11-01 DIAGNOSIS — G4730 Sleep apnea, unspecified: Secondary | ICD-10-CM

## 2012-11-01 NOTE — Patient Instructions (Signed)
Please continue using the CPAP as currently done. Should you have any surgery or trauma to the upper airway, or weight cahnge  of 30 pounds or more , we would like to see you earlier. Otherwise revisit in one year. For weight loss please review the 2  and 5 diet, also known as intermittent fasting may find more information on the Internet or through a book written by Dr. Leland Johns.  Exercise to Lose Weight Exercise and a healthy diet may help you lose weight. Your doctor may suggest specific exercises. EXERCISE IDEAS AND TIPS  Choose low-cost things you enjoy doing, such as walking, bicycling, or exercising to workout videos.  Take stairs instead of the elevator.  Walk during your lunch break.  Park your car further away from work or school.  Go to a gym or an exercise class.  Start with 5 to 10 minutes of exercise each day. Build up to 30 minutes of exercise 4 to 6 days a week.  Wear shoes with good support and comfortable clothes.  Stretch before and after working out.  Work out until you breathe harder and your heart beats faster.  Drink extra water when you exercise.  Do not do so much that you hurt yourself, feel dizzy, or get very short of breath. Exercises that burn about 150 calories:  Running 1  miles in 15 minutes.  Playing volleyball for 45 to 60 minutes.  Washing and waxing a car for 45 to 60 minutes.  Playing touch football for 45 minutes.  Walking 1  miles in 35 minutes.  Pushing a stroller 1  miles in 30 minutes.  Playing basketball for 30 minutes.  Raking leaves for 30 minutes.  Bicycling 5 miles in 30 minutes.  Walking 2 miles in 30 minutes.  Dancing for 30 minutes.  Shoveling snow for 15 minutes.  Swimming laps for 20 minutes.  Walking up stairs for 15 minutes.  Bicycling 4 miles in 15 minutes.  Gardening for 30 to 45 minutes.  Jumping rope for 15 minutes.  Washing windows or floors for 45 to 60 minutes. Document Released:  06/21/2010 Document Revised: 08/11/2011 Document Reviewed: 06/21/2010 [facility] Patient Information 2014 [place], [place]. Sleep Apnea Sleep apnea is disorder that affects a person's sleep. A person with sleep apnea has abnormal pauses in their breathing when they sleep. It is hard for them to get a good sleep. This makes a person tired during the day. It also can lead to other physical problems. There are three types of sleep apnea. One type is when breathing stops for a short time because your airway is blocked (obstructive sleep apnea). Another type is when the brain sometimes fails to give the normal signal to breathe to the muscles that control your breathing (central sleep apnea). The third type is a combination of the other two types. HOME CARE  Do not sleep on your back. Try to sleep on your side.  Take all medicine as told by your doctor.  Avoid alcohol, calming medicines (sedatives), and depressant drugs.  Try to lose weight if you are overweight. Talk to your doctor about a healthy weight goal. Your doctor may have you use a device that helps to open your airway. It can help you get the air that you need. It is called a positive airway pressure (PAP) device. There are three types of PAP devices:  Continuous positive airway pressure (CPAP) device.  Nasal expiratory positive airway pressure (EPAP) device.  Bilevel positive airway pressure (  BPAP) device. MAKE SURE YOU:  Understand these instructions.  Will watch your condition.  Will get help right away if you are not doing well or get worse. Document Released: 02/26/2008 Document Revised: 05/05/2012 Document Reviewed: 09/20/2011 [facility] Patient Information 2014 [place], [place].

## 2012-11-01 NOTE — Progress Notes (Addendum)
Chief Complaint  Patient presents with  . Follow-up    CPAP, f/u, rm 10                        [facility]  Provider:  Dr Brookelin Felber Referring Provider: Lupita Raider, MD Primary Care Physician:  Alysia Penna, MD  Chief Complaint  Patient presents with  . Follow-up    CPAP, f/u, rm 10    HPI:  [name] is a 52 y.o. male here as a referral from Dr. Clelia Croft for  CPAP follow up.   Mr. Severns was evaluated in about adult split-night polysomnography on 3-5 2014, after he presented there is a history of obesity, depression hypoxemia, anxiety and benign prostate hyperplasia- nocturia.  He  carries a prior diagnosis of obstructive sleep apnea, which was established 2003 , when he was still  morbidly obese. Following the sleep study he underwent gastric bypass surgery with massive weight loss.  He had hoped ,  that his apnea may be cured but the patient presented with the complaints of again witnessed apneas, snoring, excessive daytime sleepiness and daytime sleep attacks alternating with difficulties to sleep at night.  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  He endorsed the Epworth sleepiness score 18 points out of 24 and the [place] inventory at 21 points. At the time of the sleep study his body mass index was 35.7. His primary care physician performed an overnight pulse oximetry first which returned indicating tachycardia bradycardia and prolonged oxygen desaturations. The patient, who has facial hair would like to try a nasal pillow but she has never had before and he likes to have a longer RAMP time. He indicated in his first visit with me on [phone] when he also endorsed a mood disorder for which he was treated with Tegretol. He remained on Lamictal Wellbutrin and Tegretol for depression treatment.  Sleep habits at 9:30 in the morning he initiates sleep, until about 4:30 PM. This is on days when he works his usual nightshift. He  estimates that his daytime sleep around 6-7 hours in duration. On weekends he transitioned to nighttime sleep and daytime wakefulness. He goes  to bed late but is up early in the morning.  Following following the diagnostic part of his sleep study he was diagnosed with a mild-to-moderate degree of apnea at an A H. I of 14.9, and RDI of 19.8.  There was a strong association with supine sleep position. About 45 minutes of the total sleep time had documented oxygen desaturations can he was then titrated to CPAP to a final pressure of 8 cm water with 3 cm EPR. the AHI was now 1.4.  Patient reported that he started with his new CPAP in April 2014, the in office download of his new CPAP on nasal pillows shows a period from 09/24/2012 to the  first of June 2014 with a residual AHI of 1.2, average daily usage of 4 hours and 18 minutes set at 8 cm water with 3 cm EPR . Again the patient is a shift worker and his usage pattern varies greatly from day to day.  100% compliance for 60  days.  Depending on his work requirements. He feels subjectively that he has been helped by using CPAP his wife no longer has mentioned to him about snoring, nor are there but witnessed apneas.  He may have one bathroom break less per day or night  depending on his or time, the Epworth sleepiness score today is 11 points down from 18 prior to CPAP initiation.   The patient's brother has a diagnosis of sleep apnea and has been using a CPAP. Is no other family history known.         Review of Systems: Out of a complete 14 system review, the patient complains of only the following symptoms, and all other reviewed systems are negative. Nocturia and daytime sleepiness .    History   Social History  . Marital Status: Married    Spouse Name: N/A    Number of Children: 1  . Years of Education: Assoc.   Occupational History  . Shift worker     Medical illustrator   Social History Main Topics  . Smoking status: Current Some Day  Smoker -- 0.25 packs/day  . Smokeless tobacco: Never Used     Comment: smokes one pack in three days  . Alcohol Use: No     Comment: quit drinking in 2009  . Drug Use: No  . Sexually Active: Not on file   Other Topics Concern  . Not on file   Social History Narrative   This  patient will need to undergo a shift work adjusted sleep study. SPLIT at AHI 10, get CO2 for the diagnostic part,  supine  sleep to be documented. [place] preferred.  Start at 5 cm water and  advance from there.  3% scoring.    He has witnessed apnea and  only mild snoring, sleep attacks reported.     Epworth 14 -16 and FSS 51.           Family History  Problem Relation Age of Onset  . Other Mother     Alzheimers  . Alzheimer's disease Mother   . Cancer Father     Prostate  . Heart disease Father     Past Medical History  Diagnosis Date  . BPD (bronchopulmonary dysplasia)   . BPH (benign prostatic hyperplasia)   . Bipolar affective   . Depression   . Hidradenitis   . Morbid obesity   . OSA (obstructive sleep apnea)   . Anxiety     Past Surgical History  Procedure Laterality Date  . Hernia repair  2004  . Gastric bypass  2003  . Knee arthroscopy  1997  . Umbilical hidradenitis excision  02/27/11  . Bypass graft  1976    PLATE AND BONE     Current Outpatient Prescriptions  Medication Sig Dispense Refill  . buPROPion (WELLBUTRIN SR) 150 MG 12 hr tablet Take 150 mg by mouth daily. Take 3 once daily      . carbamazepine (EPITOL) 200 MG tablet Take 1,600 mg by mouth daily. Take 4 in the morning, 4 in the evening      . clonazePAM (KLONOPIN) 0.5 MG tablet Ad lib.      Marland Kitchen ibuprofen (ADVIL,MOTRIN) 800 MG tablet Take 800 mg by mouth 2 (two) times daily.       Marland Kitchen lamoTRIgine (LAMICTAL) 200 MG tablet Take 200 mg by mouth daily. Take 2 in the morning and 2 in the evening      . lurasidone (LATUDA) 80 MG TABS Take by mouth daily.      . metaxalone (SKELAXIN) 800 MG tablet       . ALPRAZOLAM XR 2 MG 24  hr tablet Outside order       No current facility-administered medications for this visit.    Allergies  as of 11/01/2012 - Review Complete 11/01/2012  Allergen Reaction Noted  . Sulfa antibiotics  11/21/2010    Vitals: BP 119/80  Pulse 70  Temp(Src) 98.7 F (37.1 C) (Oral)  Ht 6' 2.5" (1.892 m)  Wt 286 lb (129.729 kg)  BMI 36.24 kg/m2 Last Weight:  Wt Readings from Last 1 Encounters:  11/01/12 286 lb (129.729 kg)   Last Height:   Ht Readings from Last 1 Encounters:  11/01/12 6' 2.5" (1.892 m)   Vision Screening:  Physical exam:  General: The patient is awake, alert and appears not in acute distress. The patient is well groomed. Head: Normocephalic, atraumatic. Neck is supple. Mallampati 1 , wide open , midline uvula.  neck circumference: 17 inches . No retrognathia . Can breath through either nasion. Uses chin strap prn.  Cardiovascular:  Regular rate and rhythm , without  murmurs or carotid bruit, and without distended neck veins. Respiratory: Lungs are clear to auscultation. Skin:  Without evidence of edema, or rash Trunk: BMI is elevated and patient  has normal posture.  Neurologic exam : The patient is awake and alert, oriented to place and time.  Memory subjective described as intact. There is a normal attention span & concentration ability. Speech is fluent without  dysarthria, dysphonia or aphasia. Mood and affect are appropriate.  Cranial nerves: Pupils are equal and briskly reactive to light. Funduscopic exam without  evidence of pallor or edema. Extraocular movements  in vertical and horizontal planes intact and without nystagmus. Visual fields by finger perimetry are intact. Hearing to finger rub intact.  Facial sensation intact to fine touch. Facial motor strength is symmetric and tongue and uvula move midline.  Motor exam:   Normal tone and normal muscle bulk and symmetric normal strength in all extremities.  Sensory:  Fine touch, pinprick and vibration were  tested in all extremities. Proprioception is tested in the upper extremities only. This was  normal.  Coordination: Rapid alternating movements in the fingers/hands is tested and normal. Finger-to-nose maneuver tested and normal without evidence of ataxia, dysmetria or tremor.  Gait and station: Patient walks without assistive device and is able and assisted stool climb up to the exam table. Strength within normal limits. Stance is stable and normal. Tandem gait is  unfragmented. Romberg testing is*normal.  Deep tendon reflexes: in the  upper and lower extremities are symmetric and intact. Assessment:  After physical and neurologic examination, review of laboratory studies, imaging, neurophysiology testing and pre-existing records, assessment will be reviewed on the problem list.  Plan:  Treatment plan and additional workup will be reviewed under Problem List.   And was status post gastric bypass surgery in 2003 has not maintained a body mass index of about 35 for the last 2-1/2 years. He does intend to lose further weight but is unsure how. His Epworth sleepiness score which at the sleep study point was aborted 18 points has reduced to 11 this fatigue severity score went from 40 to points 28 points or roll the patient feels better more alert able to multitask and concentrate. It may help with his weight loss goals as well. He is using a nasal pillow mask with good success and continues to use CPAP daily even with the shifting work requirements. Today's blood pressure was in normal range.  Plan is to continue the current CPAP he was for the next 12 month and then to have her revisit yearly compliance and therapeutic data review. The patient is asked to bring his  machine to all sleep clinic  appointments. Within residual AHI of 1.2 at the current setting of 8 cm water there are no changes necessary.. Educational material about OSA provided and the patient's initial visit because a split-night  polysomnography took place in March of this year. Note on  centricity -EMR.  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                  How likely are you to doze in the following situations: 0 = not likely, 1 = slight chance, 2 = moderate chance, 3 = high chance   Total = 11.

## 2012-12-06 ENCOUNTER — Encounter: Payer: Self-pay | Admitting: Neurology

## 2013-01-01 ENCOUNTER — Encounter: Payer: Self-pay | Admitting: Neurology

## 2013-02-24 ENCOUNTER — Encounter: Payer: Self-pay | Admitting: Podiatry

## 2013-02-24 DIAGNOSIS — M2010 Hallux valgus (acquired), unspecified foot: Secondary | ICD-10-CM

## 2013-02-24 DIAGNOSIS — M2040 Other hammer toe(s) (acquired), unspecified foot: Secondary | ICD-10-CM

## 2013-02-24 DIAGNOSIS — M21629 Bunionette of unspecified foot: Secondary | ICD-10-CM

## 2013-03-02 ENCOUNTER — Telehealth: Payer: Self-pay | Admitting: *Deleted

## 2013-03-02 NOTE — Telephone Encounter (Signed)
Pt called complained of foot pain at base R 2, 3 toes with pins. Not taking Oxycotin 2 q 4 to 6 hrs. I encouraged pt to take Oxcotin 2 as directed and Advil 2 qid. Pt asked if could prescibe stronger pain med. NKDA Pt has loosen ace wrap also.

## 2013-03-03 NOTE — Telephone Encounter (Signed)
Dr Charlsie Merles advised of below and advises to wait for further request.

## 2013-03-07 ENCOUNTER — Ambulatory Visit (INDEPENDENT_AMBULATORY_CARE_PROVIDER_SITE_OTHER): Payer: Managed Care, Other (non HMO)

## 2013-03-07 ENCOUNTER — Encounter: Payer: Self-pay | Admitting: Podiatry

## 2013-03-07 ENCOUNTER — Ambulatory Visit (INDEPENDENT_AMBULATORY_CARE_PROVIDER_SITE_OTHER): Payer: Managed Care, Other (non HMO) | Admitting: Podiatry

## 2013-03-07 VITALS — BP 116/75 | HR 63 | Resp 16 | Ht 75.0 in | Wt 280.0 lb

## 2013-03-07 DIAGNOSIS — Z9889 Other specified postprocedural states: Secondary | ICD-10-CM

## 2013-03-07 DIAGNOSIS — M2010 Hallux valgus (acquired), unspecified foot: Secondary | ICD-10-CM

## 2013-03-07 DIAGNOSIS — M2040 Other hammer toe(s) (acquired), unspecified foot: Secondary | ICD-10-CM

## 2013-03-07 DIAGNOSIS — R609 Edema, unspecified: Secondary | ICD-10-CM

## 2013-03-07 MED ORDER — OXYCODONE-ACETAMINOPHEN 10-325 MG PO TABS: 1.0000 | ORAL_TABLET | ORAL | Status: DC | PRN

## 2013-03-07 NOTE — Progress Notes (Signed)
Subjective:     Patient ID: [name], male   DOB: [birth year], 52 y.o.   MRN: [id number]  HPI patient states I am doing well. States he is still having moderate pain but has been nonweightbearing in keeping his foot elevated.   Review of Systems  All other systems reviewed and are negative.       Objective:   Physical Exam  Cardiovascular: Intact distal pulses.    edema still noted in the forefoot right. Pins intact second and third digits right foot. Neurological status intact right foot lower leg. Negative Homans sign noted. Incisions all stitches are intact and incisions are healing well. Moderate edema and swelling bruising consistent with 6 days after extensive foot surgery.     Assessment:     Healing well from extensive forefoot surgery right foot.    Plan:     H&P performed. Reviewed x-rays. Reapplied sterile dressing to the right foot and instructed on continued nonweightbearing boot usage with surgical shoe being dispensed to wear at the house. Instructed to call if any changes. If not we will see him back 2 weeks for stitch removal

## 2013-03-07 NOTE — Patient Instructions (Signed)
Continue elevation, and pain meds as needed

## 2013-03-21 ENCOUNTER — Ambulatory Visit (INDEPENDENT_AMBULATORY_CARE_PROVIDER_SITE_OTHER): Payer: Managed Care, Other (non HMO) | Admitting: Podiatry

## 2013-03-21 ENCOUNTER — Ambulatory Visit (INDEPENDENT_AMBULATORY_CARE_PROVIDER_SITE_OTHER): Payer: Managed Care, Other (non HMO)

## 2013-03-21 ENCOUNTER — Encounter: Payer: Self-pay | Admitting: Podiatry

## 2013-03-21 VITALS — BP 153/98 | HR 72 | Resp 18

## 2013-03-21 DIAGNOSIS — M2010 Hallux valgus (acquired), unspecified foot: Secondary | ICD-10-CM

## 2013-03-21 DIAGNOSIS — M79609 Pain in unspecified limb: Secondary | ICD-10-CM

## 2013-03-21 DIAGNOSIS — R609 Edema, unspecified: Secondary | ICD-10-CM

## 2013-03-21 DIAGNOSIS — M79671 Pain in right foot: Secondary | ICD-10-CM

## 2013-03-21 DIAGNOSIS — Z9889 Other specified postprocedural states: Secondary | ICD-10-CM

## 2013-03-21 MED ORDER — OXYCODONE-ACETAMINOPHEN 10-325 MG PO TABS: 1.0000 | ORAL_TABLET | ORAL | Status: DC | PRN

## 2013-03-21 NOTE — Progress Notes (Signed)
Subjective:     Patient ID: [name], male   DOB: February 01, 1961, 52 y.o.   MRN: [id number]  HPI patient presents walking on his foot in his boot for 3 weeks postoperative check of extensive foot surgery right   Review of Systemsdizziness this am resolved now     Objective:   Physical Exam  Nursing note and vitals reviewed. Constitutional: He appears well-developed.  Cardiovascular: Intact distal pulses.   Musculoskeletal: Normal range of motion.  Skin: Skin is warm.   patient does have quite a bit of edema in the right forefoot and I have quite a few concerns that he is weightbearing on his foot against my instructions. He is with his wife who admits that he has been weightbearing     Assessment:     Stress to the right forefoot secondary to premature ambulation. Incision sites are healing well with moderate edema due to the excessive activity and pins in place second and third toe    Plan:     Discussed with him the importance of complete immobilization and he states he will go back to complete immobilization at this time. Reviewed his x-rays and that there's been most likely elevation of the first metatarsal secondary to his activity levels but the motion of the joint is good and I am hoping will not have to go back in and re align the joint. Reapplied sterile dressing today and began encouraged on the importance of nonweightbearing completely. Reappoint 2 weeks for reevaluation

## 2013-03-21 NOTE — Patient Instructions (Signed)
Complete non wt. Bearing for next 2 weeks

## 2013-03-21 NOTE — Progress Notes (Signed)
°  Subjective:    Patient ID: [name], male    DOB: 1961-02-06, 51 y.o.   MRN: [id number]  HPI foot seems to be ok and hurts right much    Review of Systems  Constitutional: Negative.   HENT: Negative.   Eyes: Negative.   Respiratory: Negative.   Cardiovascular: Negative.   Gastrointestinal: Negative.   Endocrine: Negative.   Genitourinary: Negative.   Musculoskeletal: Positive for back pain.  Skin: Negative.   Allergic/Immunologic: Negative.   Neurological: Positive for dizziness.       Dizzy this am  Hematological: Negative.   Psychiatric/Behavioral: Negative.        Objective:   Physical Exam        Assessment & Plan:

## 2013-04-04 ENCOUNTER — Ambulatory Visit (INDEPENDENT_AMBULATORY_CARE_PROVIDER_SITE_OTHER): Payer: Managed Care, Other (non HMO)

## 2013-04-04 ENCOUNTER — Ambulatory Visit (INDEPENDENT_AMBULATORY_CARE_PROVIDER_SITE_OTHER): Payer: Managed Care, Other (non HMO) | Admitting: Podiatry

## 2013-04-04 ENCOUNTER — Encounter: Payer: Self-pay | Admitting: Podiatry

## 2013-04-04 VITALS — BP 139/68 | HR 65 | Resp 12

## 2013-04-04 DIAGNOSIS — Z9889 Other specified postprocedural states: Secondary | ICD-10-CM

## 2013-04-04 DIAGNOSIS — M2040 Other hammer toe(s) (acquired), unspecified foot: Secondary | ICD-10-CM

## 2013-04-04 DIAGNOSIS — M2010 Hallux valgus (acquired), unspecified foot: Secondary | ICD-10-CM

## 2013-04-04 DIAGNOSIS — R609 Edema, unspecified: Secondary | ICD-10-CM

## 2013-04-04 MED ORDER — OXYCODONE-ACETAMINOPHEN 10-325 MG PO TABS: 1.0000 | ORAL_TABLET | ORAL | Status: DC | PRN

## 2013-04-05 NOTE — Progress Notes (Signed)
Subjective:     Patient ID: [name], male   DOB: [birth year], 52 y.o.   MRN: [id number]  HPI patient states that he has done a good job the last 2 weeks of staying off his right foot and that seems to be feeling better and he needs to get his left foot fixed as soon as possible due to his work situation. Approximate 6 weeks after having extensive forefoot surgery right   Review of Systems  All other systems reviewed and are negative.       Objective:   Physical Exam  Constitutional: He is oriented to person, place, and time.  Cardiovascular: Intact distal pulses.   Musculoskeletal: Normal range of motion.  Neurological: He is oriented to person, place, and time.  Skin: Skin is warm.   patient's right foot appears to be improving the edema has reduced and the pins are in place in the second and third toes. Range of motion of the first MPJ is good with 30 of dorsiflexion and 20 of plantar flexion with no pain or crepitus within the joint surface. Left foot shows severe structural deformity no where near as bad as the right foot with a deviated hallux a rigidly contracted second toe and significant discomfort in the second metatarsal phalangeal joint. The patient does not have pathology in the lesser digits besides the second      Assessment:     Healing right foot that has responded well to a period of immobilization. Structural deviation of the left foot no where near as bad as the right foot with painful bunion second toe and inflamed second metatarsal phalangeal joint    Plan:     Reviewed x-rays of the right foot and discussed the beginning of weightbearing after removal of pins which was accomplished today. Advised on gradual increase over the next week and discuss correction of the left foot which we need to get done as soon as possible. Patient wants surgery understanding and will be a difficult first couple weeks as he transitions into walking on his right foot. Willing to accept  this risk and at this time signed consent form for aggressive [facility] bunionectomy digital fusion digit 2 and shortening osteotomy of the second metatarsal left. I explained we will not get complete correction of the bunion but it is impossible to do another nonweightbearing procedure on him and he needs to do this because of his work and very hopeful this will give him a comfortable result and allow him to return to shoe gear over time but he does understand that this will not be for complete correction of deformity. Signed consent form after extensive review understanding everything as outlined all complications and the fact that total recovery Does take 6 months to one year

## 2013-04-12 ENCOUNTER — Encounter: Payer: Self-pay | Admitting: Podiatry

## 2013-04-12 DIAGNOSIS — M2040 Other hammer toe(s) (acquired), unspecified foot: Secondary | ICD-10-CM

## 2013-04-12 DIAGNOSIS — M21549 Acquired clubfoot, unspecified foot: Secondary | ICD-10-CM

## 2013-04-12 DIAGNOSIS — M2010 Hallux valgus (acquired), unspecified foot: Secondary | ICD-10-CM

## 2013-04-15 ENCOUNTER — Telehealth: Payer: Self-pay | Admitting: *Deleted

## 2013-04-15 NOTE — Telephone Encounter (Signed)
Pt request date and time of appt with DR Regal.

## 2013-04-15 NOTE — Telephone Encounter (Signed)
LMOM OF WHEN APPOINTMENT IS SCHEDULED

## 2013-04-18 ENCOUNTER — Encounter: Payer: Self-pay | Admitting: Podiatry

## 2013-04-18 ENCOUNTER — Ambulatory Visit (INDEPENDENT_AMBULATORY_CARE_PROVIDER_SITE_OTHER): Payer: Managed Care, Other (non HMO) | Admitting: Podiatry

## 2013-04-18 ENCOUNTER — Ambulatory Visit (INDEPENDENT_AMBULATORY_CARE_PROVIDER_SITE_OTHER): Payer: Managed Care, Other (non HMO)

## 2013-04-18 VITALS — BP 140/91 | HR 91 | Resp 20 | Ht 75.0 in | Wt 280.0 lb

## 2013-04-18 DIAGNOSIS — M2040 Other hammer toe(s) (acquired), unspecified foot: Secondary | ICD-10-CM

## 2013-04-18 DIAGNOSIS — Z9889 Other specified postprocedural states: Secondary | ICD-10-CM

## 2013-04-18 DIAGNOSIS — M2010 Hallux valgus (acquired), unspecified foot: Secondary | ICD-10-CM

## 2013-04-18 DIAGNOSIS — R609 Edema, unspecified: Secondary | ICD-10-CM

## 2013-04-18 MED ORDER — OXYCODONE-ACETAMINOPHEN 10-325 MG PO TABS: 1.0000 | ORAL_TABLET | Freq: Four times a day (QID) | ORAL | Status: DC | PRN

## 2013-04-19 NOTE — Progress Notes (Signed)
Subjective:     Patient ID: [name], male   DOB: 1961-01-09, 52 y.o.   MRN: [id number]  HPI patient states IM doing a little bit better but a trough having both feet fixed. One week after correction of left foot an approximate 7 weeks after right foot  Review of Systems  All other systems reviewed and are negative.       Objective:   Physical Exam  Nursing note and vitals reviewed. Constitutional: He is oriented to person, place, and time.  Cardiovascular: Intact distal pulses.   Musculoskeletal: Normal range of motion.  Neurological: He is oriented to person, place, and time.  Skin: Skin is warm.   negative Homans sign was noted and neurovascular status intact bilateral incision sites left are healing well with moderate edema consistent with this time postop. At this time the right foot seems to be healing well with continued moderate edema but very good range of motion first MPJ and no plantar callus tissue noted    Assessment:     Patient appears to be progressing well extensive foot surgery bilateral    Plan:     Reviewed both feet and reviewed x-rays. Advised on continuation of reduced activity elevation and reapplied sterile dressing to the left foot with instructions on continued immobilization reappoint 2 weeks for suture removal and less patient needs to be seen earlier

## 2013-04-25 ENCOUNTER — Telehealth: Payer: Self-pay | Admitting: *Deleted

## 2013-04-25 MED ORDER — MEPERIDINE HCL 50 MG PO TABS: ORAL_TABLET | ORAL | Status: DC

## 2013-04-25 NOTE — Telephone Encounter (Addendum)
Pt complains of severe pain in right arch, can't walk.  Pt request pain medication other then Vicodin or Percocet. Pt called at 248pm requesting pain medication Percocet or Demerol.  Dr Charlsie Merles ordered Demerol 50 mg #30 one tablet q 4 to 6 hours prn  Foot pain and an appt this week.

## 2013-04-27 ENCOUNTER — Ambulatory Visit (INDEPENDENT_AMBULATORY_CARE_PROVIDER_SITE_OTHER): Payer: Managed Care, Other (non HMO)

## 2013-04-27 ENCOUNTER — Encounter: Payer: Self-pay | Admitting: Podiatry

## 2013-04-27 ENCOUNTER — Ambulatory Visit (INDEPENDENT_AMBULATORY_CARE_PROVIDER_SITE_OTHER): Payer: Managed Care, Other (non HMO) | Admitting: Podiatry

## 2013-04-27 VITALS — BP 147/90 | HR 81 | Resp 16

## 2013-04-27 DIAGNOSIS — Z9889 Other specified postprocedural states: Secondary | ICD-10-CM

## 2013-04-27 DIAGNOSIS — M7750 Other enthesopathy of unspecified foot: Secondary | ICD-10-CM

## 2013-04-27 DIAGNOSIS — M2010 Hallux valgus (acquired), unspecified foot: Secondary | ICD-10-CM

## 2013-04-27 DIAGNOSIS — R609 Edema, unspecified: Secondary | ICD-10-CM

## 2013-04-27 MED ORDER — OXYCODONE-ACETAMINOPHEN 10-325 MG PO TABS: 1.0000 | ORAL_TABLET | Freq: Three times a day (TID) | ORAL | Status: DC | PRN

## 2013-04-27 NOTE — Progress Notes (Signed)
Subjective:     Patient ID: [name], male   DOB: 05/29/1961, 52 y.o.   MRN: [id number]  HPI patient states that overall I'm doing fine but I am getting some sharp pains in my right foot. Doesn't seem to be where he operated but more towards might ankle. Overall continuing to improve   Review of Systems     Objective:   Physical Exam    neurovascular status intact with moderate edema still noted in the forefeet bilateral with well-healed incision sites both feet and stitches intact left foot Assessment:     Continued improvement in both feet with discomfort around the posterior tibial insertion right foot    Plan:     Probable tendinitis right with x-rays reviewed today. Discussed continued bone healing and moderate structural recurrence of the bunion deformity right but clinically still functioning well. Advised on ice therapy and continued reduced activity with considerations for careful steroid injection if symptoms do not improve over the next several weeks reappoint her recheck again in 3 weeks for pin removal left and less needed earlier and today stitch removal accomplished left with wound edges well coapted

## 2013-04-29 NOTE — Telephone Encounter (Signed)
We will see patient wednesday

## 2013-05-02 ENCOUNTER — Encounter: Payer: Managed Care, Other (non HMO) | Admitting: Podiatry

## 2013-05-09 ENCOUNTER — Telehealth: Payer: Self-pay | Admitting: *Deleted

## 2013-05-09 DIAGNOSIS — M7750 Other enthesopathy of unspecified foot: Secondary | ICD-10-CM

## 2013-05-09 DIAGNOSIS — R609 Edema, unspecified: Secondary | ICD-10-CM

## 2013-05-09 NOTE — Telephone Encounter (Signed)
Pt request pain medication.  I informed pt that Dr Charlsie Merles would need to advise me on his pain medication regimen.

## 2013-05-10 MED ORDER — OXYCODONE-ACETAMINOPHEN 10-325 MG PO TABS: 1.0000 | ORAL_TABLET | Freq: Three times a day (TID) | ORAL | Status: DC | PRN

## 2013-05-11 NOTE — Telephone Encounter (Signed)
Continue pain medicine due to extensive bilateral foot surgery

## 2013-05-19 ENCOUNTER — Ambulatory Visit (INDEPENDENT_AMBULATORY_CARE_PROVIDER_SITE_OTHER): Payer: Managed Care, Other (non HMO)

## 2013-05-19 ENCOUNTER — Ambulatory Visit (INDEPENDENT_AMBULATORY_CARE_PROVIDER_SITE_OTHER): Payer: Managed Care, Other (non HMO) | Admitting: Podiatry

## 2013-05-19 ENCOUNTER — Encounter: Payer: Self-pay | Admitting: Podiatry

## 2013-05-19 VITALS — BP 140/86 | HR 72 | Resp 12 | Ht 75.0 in | Wt 290.0 lb

## 2013-05-19 DIAGNOSIS — Z9889 Other specified postprocedural states: Secondary | ICD-10-CM

## 2013-05-19 DIAGNOSIS — M2040 Other hammer toe(s) (acquired), unspecified foot: Secondary | ICD-10-CM

## 2013-05-19 DIAGNOSIS — R609 Edema, unspecified: Secondary | ICD-10-CM

## 2013-05-19 DIAGNOSIS — M2010 Hallux valgus (acquired), unspecified foot: Secondary | ICD-10-CM

## 2013-05-19 MED ORDER — IBUPROFEN 600 MG PO TABS: 600.0000 mg | ORAL_TABLET | Freq: Three times a day (TID) | ORAL | Status: DC | PRN

## 2013-05-19 MED ORDER — OXYCODONE-ACETAMINOPHEN 10-325 MG PO TABS: 1.0000 | ORAL_TABLET | Freq: Three times a day (TID) | ORAL | Status: DC | PRN

## 2013-05-19 NOTE — Progress Notes (Signed)
Subjective:     Patient ID: [name], male   DOB: 08-14-1960, 52 y.o.   MRN: [id number]  HPI patient states that he is starting to walk better and is able to make it through most of the day without too much pain. States by the end of the day his feet still swell but he is able to wear a tennis shoe on his right foot and the left foot the pin is still in place  second toe. patient is approximately 10 weeks after correction right and 6 weeks left and has been somewhat noncompliant walking directly on his right foot after surgery even though he was supposed to be nonweightbearing   Review of Systems     Objective:   Physical Exam Neurovascular status intact with negative Homans sign noted. All incision sites are healed well with mild edema in the forefoot of both feet and mild elevation of the second toe right foot with pin in place second toe left with excellent alignment. Range of motion of the first MPJ both feet is good with approximate 35 of dorsi flexion 20 of plantarflexion with no crepitus noted and pain in the plantar foot is much better than it was prior to surgery. Patient has been quite active on his feet but has no excessive edema noted at the current time    Assessment:     Patient is progressing reasonably well given the extensive nature of his surgery but was quite noncompliant in the early stages and did have some stress on the osteotomies of both feet pin remains in place second toe left with good alignment    Plan:     X-rays reviewed with patient and pin removed from second toe left with sterile dressing applied discussed that there still is quite a bit of healing to occur with excessive secondary motion around the second metatarsal left but multiple signs that gradual healing will occur patient is walking relatively well and is encouraged to continue wearing supportive shoes and I did explain to him and his wife. that there still is quite a bit of bone healing to occur with  his feet. He will not be able to return to work in the next several weeks but I am hopeful in the next 4-6 weeks that he will be able to return to work and he will get me forms to extend his FMLA. The right foot there is been some elevation of the first metatarsal segment secondary to his early ambulation but the screws are in place and I do believe that gradually the first metatarsal cuneiform joint will fuse he will be seen back in 4 weeks earlier if necessary and is given prescription for Percocet for nighttime usage and Motrin to be taken through the day at this time

## 2013-05-23 ENCOUNTER — Telehealth: Payer: Self-pay | Admitting: *Deleted

## 2013-05-23 ENCOUNTER — Encounter: Payer: Self-pay | Admitting: *Deleted

## 2013-05-23 NOTE — Telephone Encounter (Signed)
Pt states DR Charlsie Merles released him to work with these restrictions:  Full hours, with 5 - 10 minutes/hour of seated duties to alleviate swelling and in athletic shoes with steel toe caps.  I composed the letter for the pt to pick up today, for work duties to begin 05/24/2013.

## 2013-05-30 ENCOUNTER — Encounter: Payer: Self-pay | Admitting: Podiatry

## 2013-05-30 ENCOUNTER — Ambulatory Visit (INDEPENDENT_AMBULATORY_CARE_PROVIDER_SITE_OTHER): Payer: Managed Care, Other (non HMO) | Admitting: Podiatry

## 2013-05-30 VITALS — BP 143/85 | HR 71 | Resp 19 | Ht 75.0 in | Wt 285.0 lb

## 2013-05-30 DIAGNOSIS — R609 Edema, unspecified: Secondary | ICD-10-CM

## 2013-05-30 DIAGNOSIS — M2010 Hallux valgus (acquired), unspecified foot: Secondary | ICD-10-CM

## 2013-05-30 DIAGNOSIS — M2040 Other hammer toe(s) (acquired), unspecified foot: Secondary | ICD-10-CM

## 2013-05-30 MED ORDER — OXYCODONE-ACETAMINOPHEN 10-325 MG PO TABS: 1.0000 | ORAL_TABLET | Freq: Three times a day (TID) | ORAL | Status: DC | PRN

## 2013-05-30 NOTE — Progress Notes (Signed)
Subjective:     Patient ID: [name], male   DOB: 03/12/1961, 52 y.o.   MRN: [id number]  HPI patient states that been doing pretty well and my swelling is going down him walking more distances but I wanted to get the second toe right checked as he gets sore in shoe gear   Review of Systems     Objective:   Physical Exam Neurovascular status unchanged with continued reduction in edema forefoot right over left secondary to extensive foot surgery and varicosities which did not allow for good blood flow return    Assessment:     Second toe right it's mildly lifted but more irritated from utilizing splint    Plan:     Explained the patient at this time applied silicone padding to the area which she stating makes it feel much better in shoe gear and patient will continue to work in elevation compression and I did write him for more pain medicine as he does get aching this as the day goes on and I want him to stay on a small dose of the Percocet. Reappoint 3 weeks to recheck and hopeful return to work

## 2013-05-30 NOTE — Progress Notes (Signed)
Pt states, "The 2nd toe on the right foot rides up and it rubs, he says there might be something he could do here in the office with it."  Pt states work has been able to comply with the restrictions given for him to go back to work.

## 2013-06-06 ENCOUNTER — Encounter: Payer: Managed Care, Other (non HMO) | Admitting: Podiatry

## 2013-06-13 NOTE — Progress Notes (Signed)
1) Austin bunionectomy with pin fixation left foot 2) Metatarsal osteotomy with screw fixation 2nd metatarsal left foot  3) Hammer toe repair with pin fixation 2nd toe left

## 2013-06-16 ENCOUNTER — Telehealth: Payer: Self-pay | Admitting: *Deleted

## 2013-06-16 DIAGNOSIS — R52 Pain, unspecified: Secondary | ICD-10-CM

## 2013-06-16 MED ORDER — OXYCODONE-ACETAMINOPHEN 10-325 MG PO TABS: 1.0000 | ORAL_TABLET | Freq: Three times a day (TID) | ORAL | Status: DC | PRN

## 2013-06-16 NOTE — Telephone Encounter (Addendum)
Patient called requesting a prescription refill for Percocet.  He said he would pick it up on tomorrow because he'll be off work.  Call him back at [phone] or [phone].  I called and informed the patient that Dr. Josephina Shih the refill he can come by to pick it up.

## 2013-06-22 ENCOUNTER — Ambulatory Visit (INDEPENDENT_AMBULATORY_CARE_PROVIDER_SITE_OTHER): Payer: Managed Care, Other (non HMO)

## 2013-06-22 ENCOUNTER — Ambulatory Visit (INDEPENDENT_AMBULATORY_CARE_PROVIDER_SITE_OTHER): Payer: Managed Care, Other (non HMO) | Admitting: Podiatry

## 2013-06-22 ENCOUNTER — Encounter: Payer: Self-pay | Admitting: Podiatry

## 2013-06-22 VITALS — BP 129/89 | HR 88 | Resp 16

## 2013-06-22 DIAGNOSIS — Z9889 Other specified postprocedural states: Secondary | ICD-10-CM

## 2013-06-22 DIAGNOSIS — M216X9 Other acquired deformities of unspecified foot: Secondary | ICD-10-CM

## 2013-06-22 DIAGNOSIS — M2040 Other hammer toe(s) (acquired), unspecified foot: Secondary | ICD-10-CM

## 2013-06-22 DIAGNOSIS — M2010 Hallux valgus (acquired), unspecified foot: Secondary | ICD-10-CM

## 2013-06-22 DIAGNOSIS — M779 Enthesopathy, unspecified: Secondary | ICD-10-CM

## 2013-06-22 DIAGNOSIS — R609 Edema, unspecified: Secondary | ICD-10-CM

## 2013-06-22 MED ORDER — OXYCODONE-ACETAMINOPHEN 10-325 MG PO TABS: 1.0000 | ORAL_TABLET | Freq: Three times a day (TID) | ORAL | Status: DC | PRN

## 2013-06-22 NOTE — Progress Notes (Signed)
Subjective:     Patient ID: [name], male   DOB: Aug 05, 1960, 53 y.o.   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 Patient states the pain is generalized and the swelling continues to reduce in both feet and his feet feel more comfortable in his boots than he did prior to surgery   Review of Systems     Objective:   Physical Exam Neurovascular status intact with continued lower extremity varicosities and edema with surgical sites which are healing reasonably well on both feet clinically with diminished edema and good range of motion of the first MPJ both feet with no crepitus or restriction noted second toes are moderately lifted but they're no longer hurting and plantar keratotic lesions have resolved from prior to surgery    Assessment:     Patient is doing reasonably well clinically and still has quite a bit of healing to occur x-ray wise but is able to work 5 days a week and is able to ambulate with discomfort which occurs at the end of this shift which is to be expected with the amount of surgery he had    Plan:     Reviewed x-rays with him and discussed that there is still a significant amount of bone healing to occur but since he is doing well clinically I do think we have a lecturer he of allowing consolidation of bone to occur as it has. I did explain that the x-rays show multiple areas were bone stress is occurring and there is secondary bone healing occurring and he is completely where this but again from a clinical perspective is satisfied with his progression. He does get discomfort at the end of the shift and we will continue to use Percocet on occasional basis to keep his discomfort down and I have also recommended long-term orthotics to reduce stress but it is too early to make them at this time. He'll be reevaluated in 6 weeks earlier if any issues should occur

## 2013-06-27 ENCOUNTER — Encounter: Payer: Managed Care, Other (non HMO) | Admitting: Podiatry

## 2013-08-04 ENCOUNTER — Ambulatory Visit (INDEPENDENT_AMBULATORY_CARE_PROVIDER_SITE_OTHER): Payer: Managed Care, Other (non HMO)

## 2013-08-04 ENCOUNTER — Ambulatory Visit (INDEPENDENT_AMBULATORY_CARE_PROVIDER_SITE_OTHER): Payer: Managed Care, Other (non HMO) | Admitting: Podiatry

## 2013-08-04 DIAGNOSIS — M2040 Other hammer toe(s) (acquired), unspecified foot: Secondary | ICD-10-CM

## 2013-08-04 DIAGNOSIS — Z9889 Other specified postprocedural states: Secondary | ICD-10-CM

## 2013-08-04 DIAGNOSIS — M2010 Hallux valgus (acquired), unspecified foot: Secondary | ICD-10-CM

## 2013-08-04 DIAGNOSIS — R609 Edema, unspecified: Secondary | ICD-10-CM

## 2013-08-04 MED ORDER — OXYCODONE-ACETAMINOPHEN 10-325 MG PO TABS: 1.0000 | ORAL_TABLET | Freq: Three times a day (TID) | ORAL | Status: DC | PRN

## 2013-08-04 NOTE — Progress Notes (Signed)
Subjective:     Patient ID: [name], male   DOB: June 15, 1960, 53 y.o.   MRN: [id number]  HPI patient continues to improve from foot surgery of both feet stating at the end of the day he can get sharp pains that he is now working full 12 hour days and gets through the days well but has pain in his feet at night but much less than previously and he is quite confident that he is getting better. States the improvement is already better than he was prior to surgery   Review of Systems     Objective:   Physical Exam Neurovascular status intact with continued diminishment of edema of the forefoot both feet with reasonable clinical structural alignment mild elevation of the second toe on the right foot with no redness of the joint itself and no keratotic tissue noted plantar on both feet. Range of motion of the first MPJ is approximate 35 bilateral with no crepitus the joint noted    Assessment:     Improving from foot surgery bilateral with patient been relatively noncompliant during the initial phase is causing some bone movement gradual consolidation continues to occur and clinically he is progressing well from extensive surgery    Plan:     X-rays reviewed with patient and discussed the gradual continued improvement and the fact that clinically he is doing very well with x-ray showing that there still is healing to go especially the right first metatarsocuneiform joint but clinically it does not hurt. Patient is satisfied with how things are and wants to continue working at his pace that he is now and I did go ahead and write him for Percocet 50 pills which she will take approximately 1 every 2 days in the evening for pain if he over does it on his feet. Reappoint 3 months or earlier if any issues should occur

## 2013-09-28 ENCOUNTER — Telehealth: Payer: Self-pay | Admitting: *Deleted

## 2013-09-28 MED ORDER — OXYCODONE-ACETAMINOPHEN 10-325 MG PO TABS: 1.0000 | ORAL_TABLET | Freq: Three times a day (TID) | ORAL | Status: DC | PRN

## 2013-09-28 NOTE — Telephone Encounter (Addendum)
Patient presents requesting a refill of Oxycodone.  Dr. Josephina Shih the refill, quantity of 40.  I made patient aware per Dr. Paulla Dolly that we are going to cut back on the pain medication.

## 2013-11-10 ENCOUNTER — Encounter: Payer: Self-pay | Admitting: Podiatry

## 2013-11-10 ENCOUNTER — Ambulatory Visit (INDEPENDENT_AMBULATORY_CARE_PROVIDER_SITE_OTHER): Payer: Managed Care, Other (non HMO)

## 2013-11-10 ENCOUNTER — Ambulatory Visit (INDEPENDENT_AMBULATORY_CARE_PROVIDER_SITE_OTHER): Payer: Managed Care, Other (non HMO) | Admitting: Podiatry

## 2013-11-10 VITALS — BP 138/74 | HR 80 | Resp 16

## 2013-11-10 DIAGNOSIS — M2010 Hallux valgus (acquired), unspecified foot: Secondary | ICD-10-CM

## 2013-11-10 DIAGNOSIS — M2040 Other hammer toe(s) (acquired), unspecified foot: Secondary | ICD-10-CM

## 2013-11-10 NOTE — Progress Notes (Signed)
Subjective:     Patient ID: [name], male   DOB: 1961/01/26, 53 y.o.   MRN: [id number]  HPI Pt states " Everything is progressing well" He denies pain from surgical areas. C/O pain in right foot/ callus on bottom of foot.   Review of Systems     Objective:   Physical Exam     Assessment:       Plan:

## 2013-11-10 NOTE — Progress Notes (Signed)
Subjective:     Patient ID: [name], male   DOB: 1961-02-04, 53 y.o.   MRN: [id number]  HPI patient states I'm doing much better. Working full time with minimal swelling and minimal discomfort   Review of Systems     Objective:   Physical Exam Neurovascular status intact with surgical sites on both feet that healed well with excellent range of motion first MPJ and good alignment noted    Assessment:     Doing well from extensive forefoot surgery of both feet    Plan:     Reviewed final x-rays and allow patient to return to all normal activities with consideration for orthotics in future

## 2013-12-08 ENCOUNTER — Ambulatory Visit: Payer: Self-pay

## 2013-12-08 ENCOUNTER — Ambulatory Visit (INDEPENDENT_AMBULATORY_CARE_PROVIDER_SITE_OTHER): Payer: Managed Care, Other (non HMO)

## 2013-12-08 VITALS — BP 162/86 | HR 86 | Resp 12

## 2013-12-08 DIAGNOSIS — L03039 Cellulitis of unspecified toe: Secondary | ICD-10-CM

## 2013-12-08 DIAGNOSIS — M2040 Other hammer toe(s) (acquired), unspecified foot: Secondary | ICD-10-CM

## 2013-12-08 DIAGNOSIS — S90426A Blister (nonthermal), unspecified lesser toe(s), initial encounter: Secondary | ICD-10-CM

## 2013-12-08 DIAGNOSIS — L02619 Cutaneous abscess of unspecified foot: Secondary | ICD-10-CM

## 2013-12-08 DIAGNOSIS — R52 Pain, unspecified: Secondary | ICD-10-CM

## 2013-12-08 DIAGNOSIS — S90829A Blister (nonthermal), unspecified foot, initial encounter: Secondary | ICD-10-CM

## 2013-12-08 DIAGNOSIS — L089 Local infection of the skin and subcutaneous tissue, unspecified: Secondary | ICD-10-CM

## 2013-12-08 MED ORDER — CEPHALEXIN 500 MG PO CAPS: 500.0000 mg | ORAL_CAPSULE | Freq: Three times a day (TID) | ORAL | Status: DC

## 2013-12-08 MED ORDER — OXYCODONE-ACETAMINOPHEN 10-325 MG PO TABS: 1.0000 | ORAL_TABLET | Freq: Three times a day (TID) | ORAL | Status: DC | PRN

## 2013-12-08 NOTE — Progress Notes (Signed)
   Subjective:    Patient ID: [name], male    DOB: Oct 05, 1960, 53 y.o.   MRN: [id number]  HPI  PT STATED RT FOOT 4TH TOE THINK IS BROKEN AND ITS BEEN HURTING 1 MONTH. THE TOE IS GETTING WORSE. THE TOE GET AGGRAVATED BY PRESSURE AND WEARING SHOES. TRIED TO PUT COTTON BETWEEN THE TOES AND IT MAKE WORSE.    Review of Systems  All other systems reviewed and are negative.      Objective:   Physical Exam 53 year old white male well-developed well-nourished returns to presents this time with painful lesion macerated keratotic lesion medial surface fourth digit right foot. Patient had multiple procedures done on both feet Kling hammertoe repairs 23 and 4 there appears to be rectus second third toe however the fourth toe is deviated laterally and unstable at the IP joint. As not have successful fusion of the fourth toe and has slight dislocation or subluxation with a flail type fourth digit. The proximal phalanx head is causing irritation against the adjacent third digit with resultant keratosis and blister which is macerated and inflamed at this time following debridement of macerated keratotic tissue blister there is a pink base down to dermal level. Vascular status otherwise intact pedal pulses palpable DP +2/4 for PT plus one over 4 bilateral. Epicritic sensations intact normal plantar response DTRs not listed there is localized edema and erythema the digits cellulitis no proximal extension cellulitis or lymph edema noted no lymphangitis noted patient has been afebrile x-rays reveal a subluxation at the IP joint fourth digit right foot also fractured screw fixation first metatarsal base is identified with a nonunion of the first making form fusion and dislocation subluxation second and third MTP area as well.       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 Patient has blister was also seen cellulitis or infection  medial surface fourth digit at this time the area is debrided of any necrotic and friable skin tree with Betadine and 80 bandage dressing also dispensed and tubercle padding to keep the toes separated a cushioned recommend cleansing with soap and water or saline and water daily applied dressings and reappointed in 2 weeks with either Dr. Jeris Penta or myself for possible consult may be candidate for redo hammertoe procedure with possibly screw placement for fusion we'll reevaluate I consent in 2-3 weeks in the interim patient is placed on a regimen of antibiotics cephalexin 500 mg 3 times a day x10 days. Also requested medication for pain and a refill of oxycodone and acetaminophen 5/325 is dispensed 30 tablets one every 8 hours.  Harriet Masson DPM

## 2014-02-21 ENCOUNTER — Other Ambulatory Visit: Payer: Self-pay | Admitting: Gastroenterology

## 2014-02-21 LAB — HM COLONOSCOPY

## 2014-03-06 ENCOUNTER — Encounter: Payer: Self-pay | Admitting: Podiatry

## 2014-03-06 ENCOUNTER — Ambulatory Visit (INDEPENDENT_AMBULATORY_CARE_PROVIDER_SITE_OTHER): Payer: Managed Care, Other (non HMO) | Admitting: Podiatry

## 2014-03-06 ENCOUNTER — Ambulatory Visit (INDEPENDENT_AMBULATORY_CARE_PROVIDER_SITE_OTHER): Payer: Managed Care, Other (non HMO)

## 2014-03-06 VITALS — BP 126/82 | HR 79 | Resp 16

## 2014-03-06 DIAGNOSIS — M79674 Pain in right toe(s): Secondary | ICD-10-CM

## 2014-03-06 DIAGNOSIS — M779 Enthesopathy, unspecified: Secondary | ICD-10-CM

## 2014-03-06 NOTE — Progress Notes (Signed)
Subjective:     Patient ID: [name], male   DOB: August 15, 1960, 53 y.o.   MRN: [id number]  HPI overall I'm doing pretty well but this fourth toe on my right foot is really bothering me. All other areas of them extremely well and my foot is much better than before surgery   Review of Systems     Objective:   Physical Exam Neurovascular status intact with clinical good alignment of the lesser digits with moderate deformity between the third and fourth toe right with a distal medial keratotic lesion on the fourth toe right it's very painful when pressed. 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 He wants as little amount as possible does he cannot. Work so I do think and exostectomy on the offending area with getting very good chance of correction without requiring more extensive procedure. Understands ultimately he may require more extensive procedure he wants to proceed this way but needs to wait for the holidays. Today I debrided the lesion and applied padding which made him feel much better

## 2014-03-15 ENCOUNTER — Telehealth: Payer: Self-pay | Admitting: *Deleted

## 2014-03-15 NOTE — Telephone Encounter (Signed)
I called the patient and told him Monday will be fine for the office surgery.  I asked if he could be here at 7:45am.  He stated, "I can't be here that early.  I don't get off work until 7am and that will be pushing it.  Can we do it a little later?"  I told him we can do it around lunch time because Dr. Paulla Dolly will be seeing patients.  I offered him a 11:30am arrival time for a 12N surgery.  He stated that is fine.  (procedure is a Exostectomy 4th right)

## 2014-03-15 NOTE — Telephone Encounter (Signed)
I see Dr. Paulla Dolly.  He and I discussed a procedure to do there in the office a couple of weeks ago here.  He told me to call back to schedule it. He said I would need four or five days to recoup.  I'm calling to make the appointment for Monday, this upcoming Monday.  I'll have several days afterwards to recoup.  If you would please give me a call back, I'd appreciate it.  Thank you very much.

## 2014-03-16 NOTE — Telephone Encounter (Signed)
I scheduled the office surgery on Dr. Mellody Drown schedule as well as for the procedure room. I also scheduled his first office surgery post-op visit for two weeks out with the nurse on Monday April 03, 2014 @ 9:30 am.

## 2014-03-20 ENCOUNTER — Encounter: Payer: Self-pay | Admitting: Podiatry

## 2014-03-20 ENCOUNTER — Ambulatory Visit (INDEPENDENT_AMBULATORY_CARE_PROVIDER_SITE_OTHER): Payer: Managed Care, Other (non HMO) | Admitting: Podiatry

## 2014-03-20 VITALS — BP 141/86 | HR 75 | Resp 16

## 2014-03-20 DIAGNOSIS — Q786 Multiple congenital exostoses: Secondary | ICD-10-CM

## 2014-03-20 DIAGNOSIS — M898X0 Other specified disorders of bone, multiple sites: Secondary | ICD-10-CM

## 2014-03-20 MED ORDER — HYDROCODONE-ACETAMINOPHEN 10-325 MG PO TABS
1.0000 | ORAL_TABLET | Freq: Three times a day (TID) | ORAL | Status: DC | PRN
Start: 2014-03-20 — End: 2015-06-08

## 2014-03-20 MED ORDER — OXYCODONE-ACETAMINOPHEN 10-325 MG PO TABS
1.0000 | ORAL_TABLET | Freq: Three times a day (TID) | ORAL | Status: DC | PRN
Start: 2014-03-20 — End: 2014-03-20

## 2014-03-20 NOTE — Progress Notes (Signed)
Subjective:     Patient ID: [name], male   DOB: 10-17-1960, 53 y.o.   MRN: [id number]  HPI patient presents with a painful fourth toe right foot for correction of bone spur   Review of Systems     Objective:   Physical Exam Neurovascular status intact with spur formation right fourth toe medial side with keratotic and painful    Assessment:     Exostosis right fourth toe    Plan:     Patient is brought to the OR and placed in the supine position. He is injected with 5 cc of Xylocaine Marcaine mixture and the right foot was then prepped and draped in standard anesthetic fashion. The foot was exsanguinated and the tourniquet was inflated to 250 mm of mercury. The area was prepped and utilizing a 15 blade I did a semi-elliptical incision around the offending keratotic lesion on the fourth toe. I took this down to bone exposed a spur on the bone and utilizing a rotating bur I removed the spur and toe toe I removed bone paste flushed the area and found it to be satisfactorily removed and sutured the skin with 5-0 nylon. Applied sterile dressing and released tourniquet with capillary fill noted to be immediate to all digits on the right foot. Wrote prescription for hydrocodone 03/324 and reappoint in 1 week

## 2014-03-20 NOTE — Patient Instructions (Signed)
After Surgery Instructions   1) If you are recuperating from surgery anywhere other than home, please be sure to leave us the number where you can be reached.  2) Go directly home and rest.  3) Keep the operated foot(feet) elevated six inches above the hip when sitting or lying down. This will help control swelling and pain.  4) Support the elevated foot and leg with pillows. DO NOT PLACE PILLOWS UNDER THE KNEE.  5) DO NOT REMOVE or get your bandages WET, unless you were given different instructions by your doctor to do so. This increases the risk of infection.  6) Wear your surgical shoe or surgical boot at all times when you are up on your feet.  7) A limited amount of pain and swelling may occur. The skin may take on a bruised appearance. DO NOT BE ALARMED, THIS IS NORMAL.  8) For slight pain and swelling, apply an ice pack directly over the bandages for 15 minutes only out of each hour of the day. Continue until seen in the office for your first post op visit. DON NOT     APPLY ANY FORM OF HEAT TO THE AREA.  9) Have prescriptions filled immediately and take as directed.  10) Drink lots of liquids, water and juice to stay hydrated.  11) CALL IMMEDIATELY IF:  *Bleeding continues until the following day of surgery  *Pain increases and/or does not respond to medication  *Bandages or cast appears to tight  *If your bandage gets wet  *Trip, fall or stump your surgical foot  *If your temperature goes above 101  *If you have ANY questions at all  YOU NOW CONTROL THE EFFORT OF YOUR RECOVERY. ADHERING TO THESE INSTRUCTIONS WILL OFFER YOU THE MOST COMPLETE RESULTS  

## 2014-03-27 ENCOUNTER — Ambulatory Visit (INDEPENDENT_AMBULATORY_CARE_PROVIDER_SITE_OTHER): Payer: Managed Care, Other (non HMO)

## 2014-03-27 ENCOUNTER — Encounter: Payer: Self-pay | Admitting: Podiatry

## 2014-03-27 ENCOUNTER — Ambulatory Visit (INDEPENDENT_AMBULATORY_CARE_PROVIDER_SITE_OTHER): Payer: Managed Care, Other (non HMO) | Admitting: Podiatry

## 2014-03-27 VITALS — BP 108/79 | HR 101 | Resp 16

## 2014-03-27 DIAGNOSIS — M898X9 Other specified disorders of bone, unspecified site: Secondary | ICD-10-CM

## 2014-03-27 MED ORDER — CEPHALEXIN 500 MG PO CAPS: 500.0000 mg | ORAL_CAPSULE | Freq: Three times a day (TID) | ORAL | Status: DC

## 2014-03-27 NOTE — Progress Notes (Signed)
Subjective:     Patient ID: [name], male   DOB: 11/21/1960, 53 y.o.   MRN: [id number]  HPI patient states that he has not been feeling that well and he just wanted to make sure that his toe was healing okay   Review of Systems     Objective:   Physical Exam  Neurovascular status intact with muscle strength adequate and the fourth toe right shows wound edges that are well coapted with no drainage or proximal edema erythema is noted    Assessment:     Well-healing surgical site right fourth toe with possibility for localized infective process versus systemic condition. Patient states he's had no increase in systemic temperature    Plan:     Reviewed that the toe looks good and applied sterile dressing today. At this time I did go ahead as a precautionary measure and placed him on cephalexin 500 mg 3 times a day and gave him strict instructions if he should see any change in his toe drainage redness swelling or increased pain he is to let us know immediately. Reappoint one week for stitch removal or earlier if any issues should occur

## 2014-04-03 ENCOUNTER — Ambulatory Visit: Payer: Self-pay

## 2014-04-03 ENCOUNTER — Ambulatory Visit (INDEPENDENT_AMBULATORY_CARE_PROVIDER_SITE_OTHER): Payer: Managed Care, Other (non HMO) | Admitting: Podiatry

## 2014-04-03 ENCOUNTER — Ambulatory Visit: Payer: Managed Care, Other (non HMO)

## 2014-04-03 VITALS — BP 112/78 | HR 74 | Resp 17

## 2014-04-03 DIAGNOSIS — Z9889 Other specified postprocedural states: Secondary | ICD-10-CM

## 2014-04-04 NOTE — Progress Notes (Signed)
Subjective:     Patient ID: [name], male   DOB: [birth year], 53 y.o.   MRN: [id number]  HPIpatient presents stating my toe feels good   Review of Systems     Objective:   Physical Exam Neurovascular status intact with stitches in place fourth toe right with wound edges coapted well and no drainage or erythema noted    Assessment:     Healing well from exostectomy fourth right    Plan:     Stitches removed and dressing applied and instructed on continued open toed shoes for the next couple weeks and then returning to saw shoe gear. Reappoint in 4 weeks earlier if any issues should occur

## 2014-04-16 NOTE — Progress Notes (Signed)
Dr Paulla Dolly performed a exostectomy of right 4th met on 03/20/14

## 2014-07-18 ENCOUNTER — Other Ambulatory Visit: Payer: Self-pay | Admitting: Internal Medicine

## 2014-07-18 DIAGNOSIS — Z139 Encounter for screening, unspecified: Secondary | ICD-10-CM

## 2014-08-28 ENCOUNTER — Other Ambulatory Visit: Payer: Self-pay | Admitting: Internal Medicine

## 2014-08-28 ENCOUNTER — Inpatient Hospital Stay: Admission: RE | Admit: 2014-08-28 | Payer: Managed Care, Other (non HMO) | Source: Ambulatory Visit

## 2014-08-28 DIAGNOSIS — E669 Obesity, unspecified: Secondary | ICD-10-CM

## 2014-09-04 ENCOUNTER — Inpatient Hospital Stay: Admission: RE | Admit: 2014-09-04 | Payer: Managed Care, Other (non HMO) | Source: Ambulatory Visit

## 2015-05-21 ENCOUNTER — Other Ambulatory Visit: Payer: Self-pay | Admitting: Orthopedic Surgery

## 2015-05-31 NOTE — Pre-Procedure Instructions (Signed)
JOHNIE FRANCZYK  05/31/2015      [id number] - [place], [place] - [address] [place] [address] Phone: [phone] Fax: [phone]  [facility] [address] [place] [id number] [place] [address] Phone: [phone] Fax: [phone]    Your procedure is scheduled on Monday, June 11, 2015  Report to [facility] Admitting at 8:45 A.M.  Call this number if you have problems the morning of surgery:  [phone]   Remember:  Do not eat food or drink liquids after midnight Sunday, June 10, 2015  Take these medicines the morning of surgery with A SIP OF WATER : if needed:HYDROcodone-acetaminophen ([place]) for pain, DEXILANT for stomach, Xanax if needed, Embeda   Stop taking Aspirin, vitamins, fish oil, and herbal medications. Do not take any NSAIDs ie: Ibuprofen, Advil, Naproxen or any medication containing Aspirin such as diclofenac (VOLTAREN) ; stop Tuesday, June 05, 2015   Do not wear jewelry, make-up or nail polish.  Do not wear lotions, powders, or perfumes.  You may not wear deodorant.  Do not shave 48 hours prior to surgery.  Men may shave face and neck.  Do not bring valuables to the hospital.  [facility] is not responsible for any belongings or valuables.  Contacts, dentures or bridgework may not be worn into surgery.  Leave your suitcase in the car.  After surgery it may be brought to your room.  For patients admitted to the hospital, discharge time will be determined by your treatment team.  Patients discharged the day of surgery will not be allowed to drive home.   Name and phone number of your driver:  Special instructions:  [place] - Preparing for Surgery  Before surgery, you can play an important role.  Because skin is not sterile, your skin needs to be as free of germs as possible.  You can reduce the number of germs on you skin by washing with CHG (chlorahexidine  gluconate) soap before surgery.  CHG is an antiseptic cleaner which kills germs and bonds with the skin to continue killing germs even after washing.  Please DO NOT use if you have an allergy to CHG or antibacterial soaps.  If your skin becomes reddened/irritated stop using the CHG and inform your nurse when you arrive at Short Stay.  Do not shave (including legs and underarms) for at least 48 hours prior to the first CHG shower.  You may shave your face.  Please follow these instructions carefully:   1.  Shower with CHG Soap the night before surgery and the morning of Surgery.  2.  If you choose to wash your hair, wash your hair first as usual with your normal shampoo.  3.  After you shampoo, rinse your hair and body thoroughly to remove the Shampoo.  4.  Use CHG as you would any other liquid soap.  You can apply chg directly  to the skin and wash gently with scrungie or a clean washcloth.  5.  Apply the CHG Soap to your body ONLY FROM THE NECK DOWN.  Do not use on open wounds or open sores.  Avoid contact with your eyes, ears, mouth and genitals (private parts).  Wash genitals (private parts) with your normal soap.  6.  Wash thoroughly, paying special attention to the area where your surgery will be performed.  7.  Thoroughly rinse your body with warm water from the neck down.  8.  DO NOT shower/wash with your normal soap after using and rinsing off the CHG Soap.  9.  Pat yourself dry with a clean towel.            10.  Wear clean pajamas.            11.  Place clean sheets on your bed the night of your first shower and do not sleep with pets.  Day of Surgery  Do not apply any lotions/deodorants the morning of surgery.  Please wear clean clothes to the hospital/surgery center.  Please read over the following fact sheets that you were given. Pain Booklet, Coughing and Deep Breathing, Blood Transfusion Information, Total Joint Packet, MRSA Information and Surgical Site Infection  Prevention

## 2015-06-01 ENCOUNTER — Other Ambulatory Visit: Payer: Self-pay | Admitting: Orthopedic Surgery

## 2015-06-01 ENCOUNTER — Encounter (HOSPITAL_COMMUNITY): Payer: Self-pay

## 2015-06-01 ENCOUNTER — Ambulatory Visit (HOSPITAL_COMMUNITY)
Admission: RE | Admit: 2015-06-01 | Discharge: 2015-06-01 | Disposition: A | Discharge status: Home / Self Care | Payer: BLUE CROSS/BLUE SHIELD | Source: Ambulatory Visit | Attending: Orthopedic Surgery | Admitting: Orthopedic Surgery

## 2015-06-01 ENCOUNTER — Encounter (HOSPITAL_COMMUNITY)
Admission: RE | Admit: 2015-06-01 | Discharge: 2015-06-01 | Disposition: A | Discharge status: Home / Self Care | Payer: BLUE CROSS/BLUE SHIELD | Source: Ambulatory Visit | Attending: Orthopedic Surgery | Admitting: Orthopedic Surgery

## 2015-06-01 ENCOUNTER — Other Ambulatory Visit (HOSPITAL_COMMUNITY): Payer: Managed Care, Other (non HMO)

## 2015-06-01 DIAGNOSIS — Z01818 Encounter for other preprocedural examination: Secondary | ICD-10-CM | POA: Insufficient documentation

## 2015-06-01 DIAGNOSIS — R05 Cough: Secondary | ICD-10-CM | POA: Diagnosis not present

## 2015-06-01 DIAGNOSIS — Z01810 Encounter for preprocedural cardiovascular examination: Secondary | ICD-10-CM | POA: Diagnosis not present

## 2015-06-01 DIAGNOSIS — Z01812 Encounter for preprocedural laboratory examination: Secondary | ICD-10-CM | POA: Diagnosis not present

## 2015-06-01 DIAGNOSIS — R918 Other nonspecific abnormal finding of lung field: Secondary | ICD-10-CM

## 2015-06-01 HISTORY — DX: Calculus of kidney: N20.0

## 2015-06-01 HISTORY — DX: Gastro-esophageal reflux disease without esophagitis: K21.9

## 2015-06-01 HISTORY — DX: Unspecified osteoarthritis, unspecified site: M19.90

## 2015-06-01 LAB — CBC WITH DIFFERENTIAL/PLATELET
Basophils Absolute: 0 10*3/uL (ref 0.0–0.1)
Basophils Relative: 0 %
EOS ABS: 0.1 10*3/uL (ref 0.0–0.7)
EOS PCT: 1 %
HCT: 36 % — ABNORMAL LOW (ref 39.0–52.0)
Hemoglobin: 11.6 g/dL — ABNORMAL LOW (ref 13.0–17.0)
LYMPHS ABS: 1 10*3/uL (ref 0.7–4.0)
Lymphocytes Relative: 8 %
MCH: 27.8 pg (ref 26.0–34.0)
MCHC: 32.2 g/dL (ref 30.0–36.0)
MCV: 86.3 fL (ref 78.0–100.0)
Monocytes Absolute: 0.4 10*3/uL (ref 0.1–1.0)
Monocytes Relative: 3 %
Neutro Abs: 11.5 10*3/uL — ABNORMAL HIGH (ref 1.7–7.7)
Neutrophils Relative %: 88 %
PLATELETS: 419 10*3/uL — AB (ref 150–400)
RBC: 4.17 MIL/uL — AB (ref 4.22–5.81)
RDW: 15.9 % — ABNORMAL HIGH (ref 11.5–15.5)
WBC: 13 10*3/uL — AB (ref 4.0–10.5)

## 2015-06-01 LAB — URINALYSIS, ROUTINE W REFLEX MICROSCOPIC
BILIRUBIN URINE: NEGATIVE
Glucose, UA: NEGATIVE mg/dL
Hgb urine dipstick: NEGATIVE
Ketones, ur: NEGATIVE mg/dL
LEUKOCYTES UA: NEGATIVE
NITRITE: NEGATIVE
PH: 6 (ref 5.0–8.0)
Protein, ur: NEGATIVE mg/dL
SPECIFIC GRAVITY, URINE: 1.008 (ref 1.005–1.030)

## 2015-06-01 LAB — SURGICAL PCR SCREEN
MRSA, PCR: NEGATIVE
STAPHYLOCOCCUS AUREUS: NEGATIVE

## 2015-06-01 LAB — PROTIME-INR
INR: 1.2 (ref 0.00–1.49)
PROTHROMBIN TIME: 15.3 s — AB (ref 11.6–15.2)

## 2015-06-01 LAB — BASIC METABOLIC PANEL
Anion gap: 9 (ref 5–15)
BUN: 9 mg/dL (ref 6–20)
CHLORIDE: 102 mmol/L (ref 101–111)
CO2: 27 mmol/L (ref 22–32)
CREATININE: 0.74 mg/dL (ref 0.61–1.24)
Calcium: 9.2 mg/dL (ref 8.9–10.3)
GFR calc Af Amer: >60 mL/min (ref >60)
Glucose, Bld: 135 mg/dL — ABNORMAL HIGH (ref 65–99)
Potassium: 4.5 mmol/L (ref 3.5–5.1)
SODIUM: 138 mmol/L (ref 135–145)

## 2015-06-01 LAB — TYPE AND SCREEN
ABO/RH(D): B POS
ANTIBODY SCREEN: NEGATIVE

## 2015-06-01 LAB — ABO/RH: ABO/RH(D): B POS

## 2015-06-01 LAB — APTT: APTT: 29 s (ref 24–37)

## 2015-06-01 NOTE — [place] Notes (Signed)
PCP is Dr Velna Hatchet Pt denies seeing a cardiologist. Denies ever having a card cath, stress test, or echo.

## 2015-06-03 DIAGNOSIS — J189 Pneumonia, unspecified organism: Secondary | ICD-10-CM

## 2015-06-03 HISTORY — DX: Pneumonia, unspecified organism: J18.9

## 2015-06-05 ENCOUNTER — Ambulatory Visit
Admission: RE | Admit: 2015-06-05 | Discharge: 2015-06-05 | Disposition: A | Discharge status: Home / Self Care | Payer: BLUE CROSS/BLUE SHIELD | Source: Ambulatory Visit | Attending: Orthopedic Surgery | Admitting: Orthopedic Surgery

## 2015-06-05 ENCOUNTER — Encounter (HOSPITAL_COMMUNITY): Payer: Self-pay | Admitting: Emergency Medicine

## 2015-06-05 DIAGNOSIS — R918 Other nonspecific abnormal finding of lung field: Secondary | ICD-10-CM

## 2015-06-05 MED ORDER — IOPAMIDOL (ISOVUE-300) INJECTION 61%
75.0000 mL | Freq: Once | INTRAVENOUS | Status: AC | PRN
Start: 2015-06-05 — End: 2015-06-05
  Administered 2015-06-05: 75 mL via INTRAVENOUS

## 2015-06-05 NOTE — [place] Notes (Signed)
Anesthesia Chart Review:  Pt is 55 year old male scheduled for L total knee arthroplasty on 06/11/2015 with Dr. Mayer Camel.   PMH includes:  OSA, anemia, bronchopulmonary dysplasia, bipolar affective disorder (pt denies), GERD. Former smoker. BMI 30.  Medications include: embeda, seroquel.   Preoperative labs reviewed.    CT chest 06/05/15:  1. Extensive multilobar consolidative process in the right mid lung, with an irregular central 5.8 x 4.1 cm focus of consolidation in the right middle/right upper lobes that appears to violate the minor fissure and demonstrates no internal air bronchograms. At the periphery of the consolidative process are small areas of cavitation and air bronchograms. Differential includes aggressive cavitary infection (consider atypical infectious agents such as tuberculosis, actinomycosis, mucormycosis or blastomycosis) or lung neoplasm (either bronchogenic carcinoma or pulmonary lymphoma). Pulmonology consultation is advised. 2. Nonspecific mild right hilar lymphadenopathy. 3. Mild pericardial fluid/thickening. 4. Incidental calcified 2.4 cm right thyroid lobe nodule, for which correlation with thyroid ultrasound is advised.  5. Right adrenal adenoma.  EKG 06/01/15: NSR.   I notified Juliann Pulse in Dr. Damita Dunnings office that pt will need to see pulmonology prior to surgery.   Willeen Cass, FNP-BC [facility]/Anesthesiology Phone: [phone] 06/05/2015 3:42 PM

## 2015-06-08 ENCOUNTER — Encounter: Payer: Self-pay | Admitting: Pulmonary Disease

## 2015-06-08 ENCOUNTER — Ambulatory Visit (INDEPENDENT_AMBULATORY_CARE_PROVIDER_SITE_OTHER): Payer: BLUE CROSS/BLUE SHIELD | Admitting: Pulmonary Disease

## 2015-06-08 VITALS — BP 148/90 | HR 106 | Temp 99.5°F | Ht 75.0 in | Wt 253.0 lb

## 2015-06-08 DIAGNOSIS — R918 Other nonspecific abnormal finding of lung field: Secondary | ICD-10-CM

## 2015-06-08 DIAGNOSIS — Z87891 Personal history of nicotine dependence: Secondary | ICD-10-CM | POA: Diagnosis not present

## 2015-06-08 DIAGNOSIS — G4733 Obstructive sleep apnea (adult) (pediatric): Secondary | ICD-10-CM

## 2015-06-08 DIAGNOSIS — G894 Chronic pain syndrome: Secondary | ICD-10-CM | POA: Diagnosis not present

## 2015-06-08 DIAGNOSIS — G4730 Sleep apnea, unspecified: Secondary | ICD-10-CM

## 2015-06-08 DIAGNOSIS — M179 Osteoarthritis of knee, unspecified: Secondary | ICD-10-CM

## 2015-06-08 DIAGNOSIS — M1712 Unilateral primary osteoarthritis, left knee: Secondary | ICD-10-CM

## 2015-06-08 DIAGNOSIS — F411 Generalized anxiety disorder: Secondary | ICD-10-CM | POA: Diagnosis not present
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
## 2015-06-08 NOTE — Patient Instructions (Signed)
[name], it was nice meeting you today...  We discussed your recent symptoms and the CXR/ CT scan abnormality...  The next step in the evaluation is to obtain a PET Scan...    We will set this up for you ASAP & call you as soon as we see the results...  The following step will likely be proceeding w/ a biopsy to get a tissue diagnosis...    This is needed so that we can get you the best poss therapy at this point in time...  Call for any questions or if we can be of service in any way.Marland KitchenMarland Kitchen

## 2015-06-11 ENCOUNTER — Inpatient Hospital Stay (HOSPITAL_COMMUNITY)
Admission: RE | Admit: 2015-06-11 | Payer: BLUE CROSS/BLUE SHIELD | Source: Ambulatory Visit | Admitting: Orthopedic Surgery

## 2015-06-11 ENCOUNTER — Ambulatory Visit (HOSPITAL_COMMUNITY)
Admission: RE | Admit: 2015-06-11 | Discharge: 2015-06-11 | Disposition: A | Discharge status: Home / Self Care | Payer: BLUE CROSS/BLUE SHIELD | Source: Ambulatory Visit | Attending: Pulmonary Disease | Admitting: Pulmonary Disease

## 2015-06-11 ENCOUNTER — Encounter (HOSPITAL_COMMUNITY): Admission: RE | Payer: Self-pay | Source: Ambulatory Visit

## 2015-06-11 DIAGNOSIS — Z87891 Personal history of nicotine dependence: Secondary | ICD-10-CM | POA: Insufficient documentation

## 2015-06-11 DIAGNOSIS — R918 Other nonspecific abnormal finding of lung field: Secondary | ICD-10-CM | POA: Diagnosis not present

## 2015-06-11 DIAGNOSIS — E279 Disorder of adrenal gland, unspecified: Secondary | ICD-10-CM | POA: Insufficient documentation

## 2015-06-11 DIAGNOSIS — E041 Nontoxic single thyroid nodule: Secondary | ICD-10-CM | POA: Insufficient documentation

## 2015-06-11 DIAGNOSIS — R590 Localized enlarged lymph nodes: Secondary | ICD-10-CM | POA: Diagnosis not present

## 2015-06-11 LAB — GLUCOSE, CAPILLARY: GLUCOSE-CAPILLARY: 80 mg/dL (ref 65–99)

## 2015-06-11 SURGERY — ARTHROPLASTY, KNEE, TOTAL
Anesthesia: Spinal | Laterality: Left

## 2015-06-11 MED ORDER — FLUDEOXYGLUCOSE F - 18 (FDG) INJECTION
12.6500 | Freq: Once | INTRAVENOUS | Status: AC | PRN
  Administered 2015-06-11: 12.65 via INTRAVENOUS

## 2015-06-12 ENCOUNTER — Telehealth: Payer: Self-pay

## 2015-06-12 NOTE — [place] Notes (Signed)
Quick Note:  Bronch scheduled for 06/15/15 at [facility] for 2pm. Pt is aware per the 06/12/15 phone note ______

## 2015-06-12 NOTE — Telephone Encounter (Signed)
Spoke with BQ on the phone, who states that pt needs a bronchoscopy per SN.  SN has already spoken with pt to make him aware that this needed to be scheduled.  Per BQ this needs to be scheduled Friday afternoon at [facility].   Called Tara at Respiratory, Charleen Kirks is scheduled at [facility] at 2:00.   lmtcb X1 on both pt's home and mobile # listed.  wcb to make aware.

## 2015-06-12 NOTE — Telephone Encounter (Signed)
Pt returned call Spoke with patient and discussed bronch date/time/etc Pt voiced his understanding and denied any questions/concerns at this time Nothing further needed; will sign off

## 2015-06-15 ENCOUNTER — Encounter (HOSPITAL_COMMUNITY)
Admission: RE | Disposition: A | Discharge status: Home / Self Care | Payer: Self-pay | Source: Ambulatory Visit | Attending: Pulmonary Disease

## 2015-06-15 ENCOUNTER — Ambulatory Visit (HOSPITAL_COMMUNITY)
Admission: RE | Admit: 2015-06-15 | Discharge: 2015-06-15 | Disposition: A | Discharge status: Home / Self Care | Payer: BLUE CROSS/BLUE SHIELD | Source: Ambulatory Visit | Attending: Pulmonary Disease | Admitting: Pulmonary Disease

## 2015-06-15 ENCOUNTER — Encounter (HOSPITAL_COMMUNITY): Payer: Self-pay | Admitting: Certified Registered"

## 2015-06-15 ENCOUNTER — Ambulatory Visit (HOSPITAL_COMMUNITY): Payer: BLUE CROSS/BLUE SHIELD | Admitting: Certified Registered"

## 2015-06-15 ENCOUNTER — Inpatient Hospital Stay (HOSPITAL_COMMUNITY)
Admission: RE | Admit: 2015-06-15 | Discharge: 2015-06-15 | Disposition: A | Discharge status: Home / Self Care | Payer: BLUE CROSS/BLUE SHIELD | Source: Ambulatory Visit

## 2015-06-15 ENCOUNTER — Telehealth: Payer: Self-pay | Admitting: Pulmonary Disease

## 2015-06-15 DIAGNOSIS — Z6831 Body mass index (BMI) 31.0-31.9, adult: Secondary | ICD-10-CM | POA: Diagnosis not present

## 2015-06-15 DIAGNOSIS — R590 Localized enlarged lymph nodes: Secondary | ICD-10-CM | POA: Insufficient documentation

## 2015-06-15 DIAGNOSIS — R918 Other nonspecific abnormal finding of lung field: Secondary | ICD-10-CM

## 2015-06-15 DIAGNOSIS — R222 Localized swelling, mass and lump, trunk: Secondary | ICD-10-CM | POA: Diagnosis present

## 2015-06-15 DIAGNOSIS — Z87891 Personal history of nicotine dependence: Secondary | ICD-10-CM | POA: Insufficient documentation

## 2015-06-15 DIAGNOSIS — R599 Enlarged lymph nodes, unspecified: Secondary | ICD-10-CM | POA: Diagnosis not present

## 2015-06-15 DIAGNOSIS — J8410 Pulmonary fibrosis, unspecified: Secondary | ICD-10-CM | POA: Insufficient documentation

## 2015-06-15 DIAGNOSIS — G4733 Obstructive sleep apnea (adult) (pediatric): Secondary | ICD-10-CM | POA: Diagnosis not present

## 2015-06-15 HISTORY — PX: ENDOBRONCHIAL ULTRASOUND: SHX5096

## 2015-06-15 HISTORY — PX: VIDEO BRONCHOSCOPY: SHX5072

## 2015-06-15 SURGERY — ENDOBRONCHIAL ULTRASOUND (EBUS)
Anesthesia: General

## 2015-06-15 SURGERY — BRONCHOSCOPY, WITH FLUOROSCOPY
Anesthesia: Moderate Sedation | Laterality: Bilateral

## 2015-06-15 MED ORDER — LACTATED RINGERS IV SOLN
INTRAVENOUS | Status: DC
  Administered 2015-06-15 (×2): via INTRAVENOUS

## 2015-06-15 MED ORDER — 0.9 % SODIUM CHLORIDE (POUR BTL) OPTIME
TOPICAL | Status: DC | PRN
  Administered 2015-06-15: 1000 mL

## 2015-06-15 MED ORDER — SUGAMMADEX SODIUM 500 MG/5ML IV SOLN
INTRAVENOUS | Status: DC | PRN
  Administered 2015-06-15: 200 mg via INTRAVENOUS

## 2015-06-15 MED ORDER — PROPOFOL 10 MG/ML IV BOLUS
INTRAVENOUS | Status: DC | PRN
  Administered 2015-06-15: 200 mg via INTRAVENOUS

## 2015-06-15 MED ORDER — MIDAZOLAM HCL 2 MG/2ML IJ SOLN
INTRAMUSCULAR | Status: AC
  Filled 2015-06-15: qty 2

## 2015-06-15 MED ORDER — PHENYLEPHRINE HCL 10 MG/ML IJ SOLN
INTRAMUSCULAR | Status: DC | PRN
  Administered 2015-06-15 (×2): 40 ug via INTRAVENOUS
  Administered 2015-06-15: 80 ug via INTRAVENOUS
  Administered 2015-06-15 (×2): 40 ug via INTRAVENOUS
  Administered 2015-06-15: 80 ug via INTRAVENOUS
  Administered 2015-06-15: 40 ug via INTRAVENOUS

## 2015-06-15 MED ORDER — HYDROMORPHONE HCL 1 MG/ML IJ SOLN
INTRAMUSCULAR | Status: AC
  Filled 2015-06-15: qty 1

## 2015-06-15 MED ORDER — SUFENTANIL CITRATE 50 MCG/ML IV SOLN
INTRAVENOUS | Status: DC | PRN
  Administered 2015-06-15: 20 ug via INTRAVENOUS

## 2015-06-15 MED ORDER — SUFENTANIL CITRATE 50 MCG/ML IV SOLN
INTRAVENOUS | Status: AC
  Filled 2015-06-15: qty 1

## 2015-06-15 MED ORDER — EPINEPHRINE HCL 1 MG/ML IJ SOLN
INTRAMUSCULAR | Status: AC
  Filled 2015-06-15: qty 1

## 2015-06-15 MED ORDER — CEFAZOLIN SODIUM-DEXTROSE 2-3 GM-% IV SOLR: 2.0000 g | INTRAVENOUS | Status: DC

## 2015-06-15 MED ORDER — HYDROMORPHONE HCL 1 MG/ML IJ SOLN
0.2500 mg | INTRAMUSCULAR | Status: DC | PRN
  Administered 2015-06-15: 0.5 mg via INTRAVENOUS

## 2015-06-15 MED ORDER — PHENYLEPHRINE 40 MCG/ML (10ML) SYRINGE FOR IV PUSH (FOR BLOOD PRESSURE SUPPORT)
PREFILLED_SYRINGE | INTRAVENOUS | Status: AC
  Filled 2015-06-15: qty 20

## 2015-06-15 MED ORDER — PROMETHAZINE HCL 25 MG/ML IJ SOLN: 6.2500 mg | INTRAMUSCULAR | Status: DC | PRN

## 2015-06-15 MED ORDER — LIDOCAINE HCL (CARDIAC) 20 MG/ML IV SOLN
INTRAVENOUS | Status: DC | PRN
  Administered 2015-06-15: 100 mg via INTRAVENOUS

## 2015-06-15 MED ORDER — SUGAMMADEX SODIUM 500 MG/5ML IV SOLN
INTRAVENOUS | Status: AC
  Filled 2015-06-15: qty 5

## 2015-06-15 MED ORDER — ROCURONIUM BROMIDE 50 MG/5ML IV SOLN
INTRAVENOUS | Status: AC
  Filled 2015-06-15: qty 1

## 2015-06-15 MED ORDER — SODIUM CHLORIDE 0.9 % IV SOLN
0.0125 ug/kg/min | INTRAVENOUS | Status: AC
  Administered 2015-06-15: .2 ug/kg/min via INTRAVENOUS
  Administered 2015-06-15: .1 ug/kg/min via INTRAVENOUS
  Filled 2015-06-15: qty 2000

## 2015-06-15 MED ORDER — MIDAZOLAM HCL 5 MG/5ML IJ SOLN
INTRAMUSCULAR | Status: DC | PRN
  Administered 2015-06-15: 2 mg via INTRAVENOUS

## 2015-06-15 MED ORDER — ONDANSETRON HCL 4 MG/2ML IJ SOLN
INTRAMUSCULAR | Status: DC | PRN
  Administered 2015-06-15: 4 mg via INTRAVENOUS

## 2015-06-15 MED ORDER — CHLORHEXIDINE GLUCONATE 4 % EX LIQD
60.0000 mL | Freq: Once | CUTANEOUS | Status: DC
  Filled 2015-06-15: qty 60

## 2015-06-15 MED ORDER — PROPOFOL 10 MG/ML IV BOLUS
INTRAVENOUS | Status: AC
  Filled 2015-06-15: qty 20

## 2015-06-15 MED ORDER — ROCURONIUM BROMIDE 100 MG/10ML IV SOLN
INTRAVENOUS | Status: DC | PRN
  Administered 2015-06-15 (×2): 20 mg via INTRAVENOUS
  Administered 2015-06-15: 50 mg via INTRAVENOUS
  Administered 2015-06-15: 10 mg via INTRAVENOUS

## 2015-06-15 MED ORDER — DEXTROSE-NACL 5-0.45 % IV SOLN: INTRAVENOUS | Status: DC

## 2015-06-15 MED ORDER — ONDANSETRON HCL 4 MG/2ML IJ SOLN
INTRAMUSCULAR | Status: AC
  Filled 2015-06-15: qty 2

## 2015-06-15 SURGICAL SUPPLY — 28 items
BALL CTTN LRG ABS STRL LF (GAUZE/BANDAGES/DRESSINGS)
BRUSH CYTOL CELLEBRITY 1.5X140 (MISCELLANEOUS) ×1 IMPLANT
CANISTER SUCTION 2500CC (MISCELLANEOUS) ×2 IMPLANT
CONT SPEC 4OZ CLIKSEAL STRL BL (MISCELLANEOUS) ×3 IMPLANT
COTTONBALL LRG STERILE PKG (GAUZE/BANDAGES/DRESSINGS) IMPLANT
COVER DOME SNAP 22 D (MISCELLANEOUS) ×2 IMPLANT
COVER TABLE BACK 60X90 (DRAPES) ×2 IMPLANT
FORCEPS BIOP RJ4 1.8 (CUTTING FORCEPS) IMPLANT
GAUZE SPONGE 4X4 12PLY STRL (GAUZE/BANDAGES/DRESSINGS) ×2 IMPLANT
GLOVE BIO SURGEON STRL SZ 6.5 (GLOVE) ×1 IMPLANT
GLOVE BIO SURGEON STRL SZ7.5 (GLOVE) ×4 IMPLANT
GOWN STRL REUS W/TWL LRG LVL3 (GOWN DISPOSABLE) ×2 IMPLANT
KIT CLEAN ENDO COMPLIANCE (KITS) ×5 IMPLANT
KIT ROOM TURNOVER OR (KITS) ×2 IMPLANT
MARKER SKIN DUAL TIP RULER LAB (MISCELLANEOUS) ×2 IMPLANT
NDL BIOPSY TRANSBRONCH 21G (NEEDLE) IMPLANT
NEEDLE 22X1 1/2 (OR ONLY) (NEEDLE) IMPLANT
NEEDLE BIOPSY TRANSBRONCH 21G (NEEDLE) IMPLANT
NEEDLE SONO TIP II EBUS (NEEDLE) ×2 IMPLANT
NS IRRIG 1000ML POUR BTL (IV SOLUTION) ×2 IMPLANT
OIL SILICONE PENTAX (PARTS (SERVICE/REPAIRS)) ×2 IMPLANT
PAD ARMBOARD 7.5X6 YLW CONV (MISCELLANEOUS) ×4 IMPLANT
SYR 20CC LL (SYRINGE) ×2 IMPLANT
SYR 20ML ECCENTRIC (SYRINGE) ×3 IMPLANT
SYR 5ML LUER SLIP (SYRINGE) ×2 IMPLANT
TOWEL OR 17X24 6PK STRL BLUE (TOWEL DISPOSABLE) ×2 IMPLANT
TRAP SPECIMEN MUCOUS 40CC (MISCELLANEOUS) ×2 IMPLANT
TUBE CONNECTING 20X1/4 (TUBING) ×3 IMPLANT

## 2015-06-15 NOTE — Transfer of Care (Signed)
Immediate Anesthesia Transfer of Care Note  Patient: [name]  Procedure(s) Performed: Procedure(s): ENDOBRONCHIAL ULTRASOUND (N/A) VIDEO BRONCHOSCOPY WITHOUT FLUORO (N/A)  Patient Location: PACU  Anesthesia Type:General  Level of Consciousness: awake, alert  and patient cooperative  Airway & Oxygen Therapy: Patient Spontanous Breathing and Patient connected to nasal cannula oxygen  Post-op Assessment: Report given to RN, Post -op Vital signs reviewed and stable and Patient moving all extremities X 4  Post vital signs: Reviewed and stable  Last Vitals:  Filed Vitals:   06/15/15 1329  BP: 157/95  Pulse: 87  Temp: 37.1 C  Resp: 20    Complications: No apparent anesthesia complications

## 2015-06-15 NOTE — Telephone Encounter (Signed)
Pt currently admitted for ebus.  Will close encounter.

## 2015-06-15 NOTE — Anesthesia Procedure Notes (Signed)
Procedure Name: Intubation Date/Time: 06/15/2015 2:40 PM Performed by: Melina Copa, Ardis Lawley R Pre-anesthesia Checklist: Patient identified, Emergency Drugs available, Suction available, Patient being monitored and Timeout performed Patient Re-evaluated:Patient Re-evaluated prior to inductionOxygen Delivery Method: Circle system utilized Preoxygenation: Pre-oxygenation with 100% oxygen Intubation Type: IV induction Ventilation: Mask ventilation without difficulty Laryngoscope Size: Mac and 4 Grade View: Grade I Tube type: Oral Tube size (mm): 9.5. Number of attempts: 2 (cords not open yet) Placement Confirmation: ETT inserted through vocal cords under direct vision,  positive ETCO2 and CO2 detector Secured at: 21 cm Tube secured with: Tape Dental Injury: Teeth and Oropharynx as per pre-operative assessment

## 2015-06-15 NOTE — H&P (Signed)
  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  He is here for bronchoscopy.  Past Medical History  Diagnosis Date  . BPH (benign prostatic hyperplasia)   . Depression   . Hidradenitis   . Morbid obesity ([place])   . OSA (obstructive sleep apnea)   . Anxiety   . Bipolar affective ([place])     no was miss dx  . Kidney stones   . GERD (gastroesophageal reflux disease)   . Arthritis   . Anemia      Family History  Problem Relation Age of Onset  . Other Mother     Alzheimers  . Alzheimer's disease Mother   . Cancer Father     Prostate  . Heart disease Father      Social History   Social History  . Marital Status: Married    Spouse Name: N/A  . Number of Children: 1  . Years of Education: Assoc.   Occupational History  . Shift worker     Building services engineer   Social History Main Topics  . Smoking status: Former Smoker -- 1.00 packs/day for 20 years    Types: Cigarettes    Quit date: 06/02/2010  . Smokeless tobacco: Never Used  . Alcohol Use: No     Comment: quit drinking in 2009  . Drug Use: No  . Sexual Activity: Not on file   Other Topics Concern  . Not on file   Social History Narrative   This  patient will need to undergo a shift work adjusted sleep study. SPLIT at [facility], get CO2 for the diagnostic part,  supine  sleep to be documented. [place] preferred.  Start at 5 cm water and  advance from there.  3% scoring.    He has witnessed apnea and  only mild snoring, sleep attacks reported.     Epworth 14 -16 and FSS 51.            Allergies  Allergen Reactions  . Sulfa Antibiotics Itching and Rash    Ask patient to clarify severity and reaction. Not indicated on history form dated 10/18/10.     @encmedstart @   There were no vitals filed for this visit. Gen: well appearing HENT: OP clear, TM's clear, neck supple PULM: CTA B, normal  percussion CV: RRR, no mgr, trace edema GI: BS+, soft, nontender Derm: no cyanosis or rash Psyche: normal mood and affect   CT images personally reviewed > RUL mass with mediastinal lymphadenopathy on PET  Impression/Plan; Lung mass with mediastinal lymphadenopathy> plan EBUS today with endobronchial biopsy  Roselie Awkward, MD [facility] Pager: [phone] Cell: [phone] After 3pm or if no response, call [phone]

## 2015-06-15 NOTE — Telephone Encounter (Signed)
Spoke with Baxter Flattery at [facility], states that another employee was notified that this pt's bronch is being changed to an Ebus today.  They have not received any written order for the change, and were wanting to verify that this is correct.  If so, the ebus is done in the OR (not where the bronch would be performed), and Baxter Flattery needs to arrange for appropriate staff.    Spoke with BQ, confirmed this has been changed from a bronch to an Ebus.    Called Tara at [facility] respiratory, aware that this has in fact been changed to an Ebus.  Baxter Flattery has asked me to contact pt to ensure that OR has contacted him.  If not, he can contact the OR.  lmtcb X1 for pt to make aware.

## 2015-06-15 NOTE — Anesthesia Preprocedure Evaluation (Signed)
Anesthesia Evaluation  Patient identified by MRN, date of birth, ID band Patient awake    Reviewed: Allergy & Precautions, NPO status , Patient's Chart, lab work & pertinent test results  Airway Mallampati: II  TM Distance: >3 FB Neck ROM: Full    Dental no notable dental hx.    Pulmonary sleep apnea , former smoker,  Lung mass   Pulmonary exam normal breath sounds clear to auscultation       Cardiovascular negative cardio ROS Normal cardiovascular exam Rhythm:Regular Rate:Normal     Neuro/Psych negative neurological ROS  negative psych ROS   GI/Hepatic negative GI ROS, Neg liver ROS,   Endo/Other  obesity  Renal/GU negative Renal ROS  negative genitourinary   Musculoskeletal negative musculoskeletal ROS (+)   Abdominal   Peds negative pediatric ROS (+)  Hematology   Anesthesia Other Findings   Reproductive/Obstetrics negative OB ROS                             Anesthesia Physical Anesthesia Plan  ASA: III  Anesthesia Plan: General   Post-op Pain Management:    Induction: Intravenous  Airway Management Planned: Oral ETT  Additional Equipment:   Intra-op Plan:   Post-operative Plan: Extubation in OR  Informed Consent: I have reviewed the patients History and Physical, chart, labs and discussed the procedure including the risks, benefits and alternatives for the proposed anesthesia with the patient or authorized representative who has indicated his/her understanding and acceptance.   Dental advisory given  Plan Discussed with: CRNA and Surgeon  Anesthesia Plan Comments:         Anesthesia Quick Evaluation

## 2015-06-15 NOTE — Anesthesia Postprocedure Evaluation (Signed)
Anesthesia Post Note  Patient: [name]  Procedure(s) Performed: Procedure(s) (LRB): ENDOBRONCHIAL ULTRASOUND (N/A) VIDEO BRONCHOSCOPY WITHOUT FLUORO (N/A)  Patient location during evaluation: PACU Anesthesia Type: General Level of consciousness: awake and alert Pain management: pain level controlled Vital Signs Assessment: post-procedure vital signs reviewed and stable Respiratory status: spontaneous breathing, nonlabored ventilation, respiratory function stable and patient connected to nasal cannula oxygen Cardiovascular status: blood pressure returned to baseline and stable Postop Assessment: no signs of nausea or vomiting Anesthetic complications: no    Last Vitals:  Filed Vitals:   06/15/15 1712 06/15/15 1713  BP:  125/79  Pulse: 80 84  Temp:    Resp: 7 20    Last Pain:  Filed Vitals:   06/15/15 1714  PainSc: 2                  Obera Stauch,W. EDMOND

## 2015-06-15 NOTE — Op Note (Signed)
Video Bronchoscopy with Endobronchial Ultrasound Procedure Note  Date of Operation: 06/15/2015  Pre-op Diagnosis: Lung mass, mediastinal lymph nodes enlargement  Post-op Diagnosis: Same  Surgeon: Roselie Awkward  Assistants: none  Anesthesia: General endotracheal anesthesia  Operation: Flexible video fiberoptic bronchoscopy with endobronchial ultrasound and biopsies.  Estimated Blood Loss: less than 50   Complications: none immediate  Indications and History: [name] is a 55 y.o. male with a smoking history and presented with recurrent pneumonia and was eventually found to have a large R sided lung mass.  The risks, benefits, complications, treatment options and expected outcomes were discussed with the patient.  The possibilities of pneumothorax, pneumonia, reaction to medication, pulmonary aspiration, perforation of a viscus, bleeding, failure to diagnose a condition and creating a complication requiring transfusion or operation were discussed with the patient who freely signed the consent.    Description of Procedure: The patient was examined in the preoperative area and history and data from the preprocedure consultation were reviewed. It was deemed appropriate to proceed.  The patient was taken to [facility], identified as [name] and the procedure verified as Flexible Video Fiberoptic Bronchoscopy.  A Time Out was held and the above information confirmed. After being taken to the operating room general anesthesia was initiated and the patient  was orally intubated. The video fiberoptic bronchoscope was introduced via the endotracheal tube and a general inspection was performed which showed infiltrated airway in the right middle lobe nearly occluding the airway . The standard scope was then withdrawn and the endobronchial ultrasound was used to identify and characterize the peritracheal, hilar and bronchial lymph nodes. Inspection showed enlarged #7 lymph node. Using real-time  ultrasound guidance Wang needle biopsies were take from Station 7 and 4R nodes and were sent for cytology.  Afterwards the standard bronchoscope was re-introduced into the airway and the brushings were performed in the right middle lobe, then a bal from the right middle lobe, then endobronchial biopsies from the right middle lobe.  Then a BAL was performed in the right upper lobe as well.  The patient tolerated the procedure well without apparent complications. There was minor bleeding which resolved spontaneously. The bronchoscope was withdrawn. Anesthesia was reversed and the patient was taken to the PACU for recovery.   Samples: 1. Wang needle biopsies from 7 node > 5 passes 2. Wang needle biopsies from 4R node > 3 Passes 3. Endobronchial brushings from the right middle lobe 4. BAL from the right middle lobe 5. Endobronchial biopsy from the right middle lobe > 8 biopsies 6. BAL from the right middle lobe  Plans:  The patient will be discharged from the PACU to home when recovered from anesthesia. We will review the cytology, pathology and microbiology results with the patient when they become available. Outpatient followup will be with Dr. Lenna Gilford.    Roselie Awkward, MD [facility] Pager: [phone] Cell: [phone] After 3pm or if no response, call [phone]

## 2015-06-18 ENCOUNTER — Encounter (HOSPITAL_COMMUNITY): Payer: Self-pay | Admitting: Pulmonary Disease

## 2015-06-18 ENCOUNTER — Telehealth: Payer: Self-pay | Admitting: Pulmonary Disease

## 2015-06-18 NOTE — Telephone Encounter (Signed)
Spoke with pt, requesting pathology results from ebus on Friday. BQ is the provider who performed the ebus on Friday. BQ please advise on results.  Thanks!

## 2015-06-18 NOTE — Telephone Encounter (Signed)
Patient called asking about results again.  I advised him it had only been 15 min since he called and the message was put back.  Advised he will be contacted with the results when they are available and I would put a note back and ask that he be called just as soon as they are available.

## 2015-06-19 NOTE — Telephone Encounter (Signed)
Spoke with pt, calling again about ebus results.   BQ is working nights at the hospital this week. SN please advise if you've seen any results on this, as you were the ordering provider.  Thanks!

## 2015-06-19 NOTE — Telephone Encounter (Signed)
Per SN- SN has already discussed results with pt, and called in abx for pt.  Pt coming in to see SN on 07/06/15 at 9:00.  SN ok with this visit, pt confirmed he can make this appt with SN.  With Chan's help appt has been scheduled.  Nothing further needed.

## 2015-06-19 NOTE — Telephone Encounter (Signed)
Pt calling again about results please advise.[name]

## 2015-06-27 ENCOUNTER — Institutional Professional Consult (permissible substitution): Payer: BLUE CROSS/BLUE SHIELD | Admitting: Pulmonary Disease

## 2015-07-06 ENCOUNTER — Ambulatory Visit (INDEPENDENT_AMBULATORY_CARE_PROVIDER_SITE_OTHER)
Admission: RE | Admit: 2015-07-06 | Discharge: 2015-07-06 | Disposition: A | Discharge status: Home / Self Care | Payer: BLUE CROSS/BLUE SHIELD | Source: Ambulatory Visit | Attending: Pulmonary Disease | Admitting: Pulmonary Disease

## 2015-07-06 ENCOUNTER — Ambulatory Visit (INDEPENDENT_AMBULATORY_CARE_PROVIDER_SITE_OTHER): Payer: BLUE CROSS/BLUE SHIELD | Admitting: Pulmonary Disease

## 2015-07-06 ENCOUNTER — Encounter: Payer: Self-pay | Admitting: Pulmonary Disease

## 2015-07-06 VITALS — BP 136/90 | HR 73 | Temp 98.1°F | Resp 16 | Ht 75.0 in | Wt 255.0 lb

## 2015-07-06 DIAGNOSIS — R918 Other nonspecific abnormal finding of lung field: Secondary | ICD-10-CM | POA: Insufficient documentation

## 2015-07-06 DIAGNOSIS — M1712 Unilateral primary osteoarthritis, left knee: Secondary | ICD-10-CM | POA: Diagnosis not present

## 2015-07-06 DIAGNOSIS — Z87891 Personal history of nicotine dependence: Secondary | ICD-10-CM

## 2015-07-06 DIAGNOSIS — G4730 Sleep apnea, unspecified: Secondary | ICD-10-CM

## 2015-07-06 DIAGNOSIS — G4733 Obstructive sleep apnea (adult) (pediatric): Secondary | ICD-10-CM | POA: Diagnosis not present
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
## 2015-07-06 NOTE — Patient Instructions (Signed)
Today we updated your med list in our EPIC system...  Your follow up CXR today is remarkably better w/ the RUL "mass" resolved...    There is some residual scarring in this area...  REC that you decrease the Prednisone to 1/2 tab every other day til gone...  Let's plan a return visit & CXR again in 3 months...  Call sooner if needed for questions...  OK to proceed w/ knee surgery from [place].[name] KitchenMarland [name]

## 2015-07-09 NOTE — Progress Notes (Signed)
Wow, great, thanks for letting me know

## 2015-07-11 ENCOUNTER — Other Ambulatory Visit: Payer: Self-pay | Admitting: Orthopedic Surgery

## 2015-07-16 ENCOUNTER — Telehealth: Payer: Self-pay | Admitting: Pulmonary Disease

## 2015-07-16 NOTE — Telephone Encounter (Signed)
Opened msg in error.  Closing encounter. °

## 2015-07-17 ENCOUNTER — Telehealth: Payer: Self-pay | Admitting: Pulmonary Disease

## 2015-07-17 ENCOUNTER — Encounter (HOSPITAL_COMMUNITY): Payer: Self-pay

## 2015-07-17 ENCOUNTER — Encounter (HOSPITAL_COMMUNITY)
Admission: RE | Admit: 2015-07-17 | Discharge: 2015-07-17 | Disposition: A | Discharge status: Home / Self Care | Payer: BLUE CROSS/BLUE SHIELD | Source: Ambulatory Visit | Attending: Orthopedic Surgery | Admitting: Orthopedic Surgery

## 2015-07-17 DIAGNOSIS — Z0183 Encounter for blood typing: Secondary | ICD-10-CM | POA: Diagnosis not present

## 2015-07-17 DIAGNOSIS — G4733 Obstructive sleep apnea (adult) (pediatric): Secondary | ICD-10-CM | POA: Insufficient documentation

## 2015-07-17 DIAGNOSIS — Z9884 Bariatric surgery status: Secondary | ICD-10-CM | POA: Insufficient documentation

## 2015-07-17 DIAGNOSIS — M1712 Unilateral primary osteoarthritis, left knee: Secondary | ICD-10-CM | POA: Insufficient documentation

## 2015-07-17 DIAGNOSIS — F319 Bipolar disorder, unspecified: Secondary | ICD-10-CM | POA: Diagnosis not present

## 2015-07-17 DIAGNOSIS — Z01812 Encounter for preprocedural laboratory examination: Secondary | ICD-10-CM | POA: Insufficient documentation

## 2015-07-17 DIAGNOSIS — K219 Gastro-esophageal reflux disease without esophagitis: Secondary | ICD-10-CM | POA: Insufficient documentation

## 2015-07-17 DIAGNOSIS — Z79899 Other long term (current) drug therapy: Secondary | ICD-10-CM | POA: Insufficient documentation

## 2015-07-17 DIAGNOSIS — Z01818 Encounter for other preprocedural examination: Secondary | ICD-10-CM | POA: Insufficient documentation

## 2015-07-17 DIAGNOSIS — Z87891 Personal history of nicotine dependence: Secondary | ICD-10-CM | POA: Diagnosis not present

## 2015-07-17 HISTORY — DX: Urgency of urination: R39.15

## 2015-07-17 HISTORY — DX: Pain in unspecified joint: M25.50

## 2015-07-17 HISTORY — DX: Frequency of micturition: R35.0

## 2015-07-17 HISTORY — DX: Pneumonia, unspecified organism: J18.9

## 2015-07-17 HISTORY — DX: Other muscle spasm: M62.838

## 2015-07-17 LAB — URINALYSIS, ROUTINE W REFLEX MICROSCOPIC
Bilirubin Urine: NEGATIVE
GLUCOSE, UA: NEGATIVE mg/dL
Hgb urine dipstick: NEGATIVE
KETONES UR: NEGATIVE mg/dL
LEUKOCYTES UA: NEGATIVE
Nitrite: NEGATIVE
PH: 5 (ref 5.0–8.0)
Protein, ur: NEGATIVE mg/dL
Specific Gravity, Urine: 1.007 (ref 1.005–1.030)

## 2015-07-17 LAB — CBC WITH DIFFERENTIAL/PLATELET
Basophils Absolute: 0 10*3/uL (ref 0.0–0.1)
Basophils Relative: 0 %
Eosinophils Absolute: 0.3 10*3/uL (ref 0.0–0.7)
Eosinophils Relative: 4 %
HEMATOCRIT: 37.8 % — AB (ref 39.0–52.0)
HEMOGLOBIN: 12.4 g/dL — AB (ref 13.0–17.0)
LYMPHS ABS: 1.7 10*3/uL (ref 0.7–4.0)
LYMPHS PCT: 22 %
MCH: 28.8 pg (ref 26.0–34.0)
MCHC: 32.8 g/dL (ref 30.0–36.0)
MCV: 87.9 fL (ref 78.0–100.0)
MONO ABS: 0.4 10*3/uL (ref 0.1–1.0)
MONOS PCT: 6 %
NEUTROS ABS: 5.2 10*3/uL (ref 1.7–7.7)
NEUTROS PCT: 68 %
Platelets: 289 10*3/uL (ref 150–400)
RBC: 4.3 MIL/uL (ref 4.22–5.81)
RDW: 17.7 % — ABNORMAL HIGH (ref 11.5–15.5)
WBC: 7.7 10*3/uL (ref 4.0–10.5)

## 2015-07-17 LAB — BASIC METABOLIC PANEL
ANION GAP: 12 (ref 5–15)
BUN: 6 mg/dL (ref 6–20)
CHLORIDE: 104 mmol/L (ref 101–111)
CO2: 25 mmol/L (ref 22–32)
Calcium: 9.6 mg/dL (ref 8.9–10.3)
Creatinine, Ser: 0.75 mg/dL (ref 0.61–1.24)
GFR calc non Af Amer: >60 mL/min (ref >60)
GLUCOSE: 143 mg/dL — AB (ref 65–99)
Potassium: 4.4 mmol/L (ref 3.5–5.1)
Sodium: 141 mmol/L (ref 135–145)

## 2015-07-17 LAB — APTT: aPTT: 29 seconds (ref 24–37)

## 2015-07-17 LAB — TYPE AND SCREEN
ABO/RH(D): B POS
ANTIBODY SCREEN: NEGATIVE

## 2015-07-17 LAB — PROTIME-INR
INR: 1.08 (ref 0.00–1.49)
PROTHROMBIN TIME: 14.2 s (ref 11.6–15.2)

## 2015-07-17 NOTE — Telephone Encounter (Signed)
Called [employer] and spoke with [employer]. She is not able to see any of Kristi's notes and she is gone for the day. Kristi VM is currently not in use and asked that we call back tomorrow to speak with Kristi. WCB

## 2015-07-17 NOTE — Pre-Procedure Instructions (Signed)
ZAHI CONTEH  07/17/2015      [id number] - [place], [place] - [address] [place]  [address] Phone: [phone] Fax: [phone]  [facility] [address] [name] [address] Phone: [phone] Fax: [phone]    Your procedure is scheduled on Fri, Feb 24 @ 7:30 AM  Report to [facility] Admitting at 5:30 AM  Call this number if you have problems the morning of surgery:  [phone]   Remember:  Do not eat food or drink liquids after midnight.  Take these medicines the morning of surgery with A SIP OF WATER Xanax(Alprazolam),Pain Pill(if needed),Baclofen(Lioresal),and Prednisone(Deltasone)               Stop taking your Diclofenac,Fish Oil,any Vitamins or Herbal Medications. No Goody's,BC's,Aleve,Ibuprofen,Motrin,or Advil.                 Do not wear jewelry.  Do not wear lotions, powders, or colognes.You may wear deoderant.             Men may shave their face and neck.  Do not bring valuables to the hospital.  [facility] is not responsible for any belongings or valuables.  Contacts, dentures or bridgework may not be worn into surgery.  Leave your suitcase in the car.  After surgery it may be brought to your room.  For patients admitted to the hospital, discharge time will be determined by your treatment team.  Patients discharged the day of surgery will not be allowed to drive home.    Special instructions:  [place] - Preparing for Surgery  Before surgery, you can play an important role.  Because skin is not sterile, your skin needs to be as free of germs as possible.  You can reduce the number of germs on you skin by washing with CHG (chlorahexidine gluconate) soap before surgery.  CHG is an antiseptic cleaner which kills germs and bonds with the skin to continue killing germs even after washing.  Please DO NOT use if you have an allergy to CHG or  antibacterial soaps.  If your skin becomes reddened/irritated stop using the CHG and inform your nurse when you arrive at Short Stay.  Do not shave (including legs and underarms) for at least 48 hours prior to the first CHG shower.  You may shave your face.  Please follow these instructions carefully:   1.  Shower with CHG Soap the night before surgery and the                                morning of Surgery.  2.  If you choose to wash your hair, wash your hair first as usual with your       normal shampoo.  3.  After you shampoo, rinse your hair and body thoroughly to remove the                      Shampoo.  4.  Use CHG as you would any other liquid soap.  You can apply chg directly       to the skin and wash gently with scrungie or a clean washcloth.  5.  Apply the CHG Soap to your body ONLY FROM THE NECK DOWN.        Do not use on open wounds or open sores.  Avoid contact with your eyes,       ears, mouth and genitals (private parts).  Wash genitals (private parts)       with your normal soap.  6.  Wash thoroughly, paying special attention to the area where your surgery        will be performed.  7.  Thoroughly rinse your body with warm water from the neck down.  8.  DO NOT shower/wash with your normal soap after using and rinsing off       the CHG Soap.  9.  Pat yourself dry with a clean towel.            10.  Wear clean pajamas.            11.  Place clean sheets on your bed the night of your first shower and do not        sleep with pets.  Day of Surgery  Do not apply any lotions/deoderants the morning of surgery.  Please wear clean clothes to the hospital/surgery center.    Please read over the following fact sheets that you were given. Pain Booklet, Coughing and Deep Breathing, Blood Transfusion Information, MRSA Information and Surgical Site Infection Prevention

## 2015-07-17 NOTE — Progress Notes (Signed)
Cardiologist denies  Dr.Nadel is Pulmonologist with last visit 06/05/15,bronch done and released   Echo denies  Stress test denies  Heart cath denies  EKG in epic from 06-01-15  CXR in epic from 07-06-15

## 2015-07-18 LAB — SURGICAL PCR SCREEN
MRSA, PCR: NEGATIVE
Staphylococcus aureus: NEGATIVE

## 2015-07-18 NOTE — Telephone Encounter (Signed)
LMTCB for [employer]

## 2015-07-18 NOTE — Progress Notes (Signed)
Anesthesia Chart Review:  Pt is a 55 year old male scheduled for L total knee arthroplasty on 07/27/2015 with Dr. Mayer Camel.   PMH includes: OSA, anemia, bipolar affective disorder, GERD. Former smoker. BMI 32. S/p gastric bypass 2003.   Medications include: iron, seroquel.   Preoperative labs reviewed.   Chest x-ray 07/06/15 reviewed. Resolution of mass-like opacity in the right lung with some minimal residual scarring remaining.  EKG 06/01/15: NSR.   Pt was originally scheduled for TKA on 06/11/15 but it was postponed when a large lung mass was identified on CXR. Pt underwent bronch with biopsy/washings- all path negative. Tx with augmentin and prednisone. Mass on CXR largely resolved, only minor scarring remains. Dr. Teressa Lower with pulmonology has cleared pt to proceed with TKA at this time.   If no changes, I anticipate pt can proceed with surgery as scheduled.   Willeen Cass, FNP-BC [facility]/Anesthesiology Phone: [phone] 07/18/2015 10:38 AM

## 2015-07-19 NOTE — Telephone Encounter (Signed)
I do not see any forms in "media" where health port has scanned anything in on pt I called [employer] to speak with Steffanie Dunn but was on hold x 10 min and never received anyone. WCB

## 2015-07-20 NOTE — Telephone Encounter (Signed)
atc Kristi, was put on hold for over 10 minutes.  Wcb.

## 2015-07-21 DIAGNOSIS — M1712 Unilateral primary osteoarthritis, left knee: Secondary | ICD-10-CM | POA: Diagnosis present

## 2015-07-26 MED ORDER — TRANEXAMIC ACID 1000 MG/10ML IV SOLN
1000.0000 mg | INTRAVENOUS | Status: AC
  Administered 2015-07-27: 1000 mg via INTRAVENOUS
  Filled 2015-07-26: qty 10

## 2015-07-26 MED ORDER — DEXTROSE-NACL 5-0.45 % IV SOLN: INTRAVENOUS | Status: DC

## 2015-07-26 MED ORDER — BUPIVACAINE LIPOSOME 1.3 % IJ SUSP
20.0000 mL | INTRAMUSCULAR | Status: AC
  Administered 2015-07-27: 20 mL
  Filled 2015-07-26: qty 20

## 2015-07-26 MED ORDER — CEFAZOLIN SODIUM-DEXTROSE 2-3 GM-% IV SOLR
2.0000 g | INTRAVENOUS | Status: AC
  Administered 2015-07-27: 2 g via INTRAVENOUS
  Filled 2015-07-26: qty 50

## 2015-07-26 MED ORDER — CHLORHEXIDINE GLUCONATE 4 % EX LIQD: 60.0000 mL | Freq: Once | CUTANEOUS | Status: DC

## 2015-07-26 NOTE — H&P (Signed)
TOTAL KNEE ADMISSION H&P  Patient is being admitted for left total knee arthroplasty.  Subjective:  Chief Complaint:left knee pain.  HPI: [name], 55 y.o. male, has a history of pain and functional disability in the left knee due to arthritis and has failed non-surgical conservative treatments for greater than 12 weeks to includeNSAID's and/or analgesics, flexibility and strengthening excercises, use of assistive devices, weight reduction as appropriate and activity modification.  Onset of symptoms was gradual, starting 2 years ago with gradually worsening course since that time. The patient noted no past surgery on the left knee(s).  Patient currently rates pain in the left knee(s) at 10 out of 10 with activity. Patient has night pain, worsening of pain with activity and weight bearing, pain that interferes with activities of daily living, pain with passive range of motion, crepitus and joint swelling.  Patient has evidence of subchondral sclerosis, periarticular osteophytes and joint space narrowing by imaging studies. There is no active infection.  Patient Active Problem List   Diagnosis Date Noted  . Primary osteoarthritis of left knee 07/21/2015  . Abnormal findings on diagnostic imaging of lung 07/06/2015  . Mediastinal lymphadenopathy   . Lung mass 06/08/2015  . Ex-cigarette smoker 06/08/2015  . Osteoarthritis of left knee 06/08/2015  . Chronic pain syndrome 06/08/2015  . Anxiety state 06/08/2015  . Sleep apnea with use of continuous positive airway pressure (CPAP) 11/01/2012  . 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 Tailor's bunion BILATERAL 09/29/2012  . 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 Hidradenitis of left thigh 05/09/2011  . BPH (benign prostatic hyperplasia) 11/21/2010  . Arthritis/joint pain 11/21/2010   Past Medical History  Diagnosis Date  . BPH (benign prostatic hyperplasia)   . Depression   . Hidradenitis   . Morbid obesity ([place])    . OSA (obstructive sleep apnea)   . Anxiety   . Kidney stones   . GERD (gastroesophageal reflux disease)   . Arthritis   . Pneumonia     put on Prednisone every other day  . Muscle spasm of both lower legs     takes Baclofen daily as needed;notices in hands as well  . Joint pain   . Back pain     DDD  . Urinary frequency   . Urinary urgency   . Anemia     takes Ferrous Sulfate every other day  . Bipolar affective ([place])     takes Seroquel nightly    Past Surgical History  Procedure Laterality Date  . Gastric bypass  2003  . Knee arthroscopy Bilateral 1997  . Umbilical hidradenitis excision  02/27/11  . Bypass graft  1976    PLATE AND BONE   . Tonsillectomy    . Foot surgery  2014    BIL  . Endobronchial ultrasound N/A 06/15/2015    Procedure: ENDOBRONCHIAL ULTRASOUND;  Surgeon: Juanito Doom, MD;  Location: [facility];  Service: Cardiopulmonary;  Laterality: N/A;  . Video bronchoscopy N/A 06/15/2015    Procedure: VIDEO BRONCHOSCOPY WITHOUT FLUORO;  Surgeon: Juanito Doom, MD;  Location: [facility];  Service: Cardiopulmonary;  Laterality: N/A;  . Nasal window    . Colonoscopy    . Esophagogastroduodenoscopy    . Hernia repair  2004    No prescriptions prior to admission   Allergies  Allergen Reactions  . Sulfa Antibiotics Itching and Rash    More severe reaction 3 years ago, caused pain.  Had taken it prior and  not as bad.    Social History  Substance Use Topics  . Smoking status: Former Smoker -- 1.00 packs/day for 20 years    Types: Cigarettes    Quit date: 06/02/2010  . Smokeless tobacco: Never Used  . Alcohol Use: 0.0 oz/week    0 Standard drinks or equivalent per week     Comment: rarely    Family History  Problem Relation Age of Onset  . Other Mother     Alzheimers  . Alzheimer's disease Mother   . Cancer Father     Prostate  . Heart disease Father      Review of Systems  Constitutional: Negative.   HENT: Negative.   Eyes: Negative.   Respiratory:  Negative.   Cardiovascular: Negative.   Gastrointestinal: Negative.   Genitourinary:       Enlarged prostate and kidney stones  Musculoskeletal: Positive for joint pain.  Skin: Negative.   Neurological: Negative.   Endo/Heme/Allergies: Negative.   Psychiatric/Behavioral: Positive for depression. The patient is nervous/anxious.     Objective:  Physical Exam  Constitutional: He is oriented to person, place, and time. He appears well-developed and well-nourished.  HENT:  Head: Normocephalic and atraumatic.  Eyes: Pupils are equal, round, and reactive to light.  Neck: Normal range of motion. Neck supple.  Respiratory: Effort normal.  Musculoskeletal: He exhibits tenderness.  Both knees have 5 flexion contractures and flex 125.  Tender along the medial joint lines, left greater than right.  Neurological: He is alert and oriented to person, place, and time.  Skin: Skin is warm and dry.  Psychiatric: He has a normal mood and affect. His behavior is normal. Judgment and thought content normal.    Vital signs in last 24 hours:    Labs:   Estimated body mass index is 31.87 kg/(m^2) as calculated from the following:   Height as of 07/06/15: 6\' 3"  (1.905 m).   Weight as of 07/06/15: 115.667 kg (255 lb).   Imaging Review Plain radiographs demonstrate  standing AP and Rosenberg x-rays of both knees continue to show moderate to severe arthritis that is symmetric with peripheral osteophytes and medial compartment sclerosis.  Assessment/Plan:  End stage arthritis, left knee   The patient history, physical examination, clinical judgment of the provider and imaging studies are consistent with end stage degenerative joint disease of the left knee(s) and total knee arthroplasty is deemed medically necessary. The treatment options including medical management, injection therapy arthroscopy and arthroplasty were discussed at length. The risks and benefits of total knee arthroplasty were  presented and reviewed. The risks due to aseptic loosening, infection, stiffness, patella tracking problems, thromboembolic complications and other imponderables were discussed. The patient acknowledged the explanation, agreed to proceed with the plan and consent was signed. Patient is being admitted for inpatient treatment for surgery, pain control, PT, OT, prophylactic antibiotics, VTE prophylaxis, progressive ambulation and ADL's and discharge planning. The patient is planning to be discharged home with home health services

## 2015-07-26 NOTE — Anesthesia Preprocedure Evaluation (Addendum)
Anesthesia Evaluation  Patient identified by MRN, date of birth, ID band Patient awake    Reviewed: Allergy & Precautions, NPO status , Patient's Chart, lab work & pertinent test results  History of Anesthesia Complications Negative for: history of anesthetic complications  Airway Mallampati: II  TM Distance: >3 FB Neck ROM: Full    Dental  (+) Teeth Intact, Dental Advisory Given   Pulmonary sleep apnea , pneumonia, resolved, former smoker,    breath sounds clear to auscultation       Cardiovascular negative cardio ROS   Rhythm:Regular Rate:Normal     Neuro/Psych PSYCHIATRIC DISORDERS Anxiety Depression Bipolar Disorder negative neurological ROS     GI/Hepatic Neg liver ROS, GERD  ,  Endo/Other  negative endocrine ROS  Renal/GU Renal disease  negative genitourinary   Musculoskeletal  (+) Arthritis , Osteoarthritis,    Abdominal   Peds negative pediatric ROS (+)  Hematology   Anesthesia Other Findings   Reproductive/Obstetrics negative OB ROS                          Lab Results  Component Value Date   WBC 7.7 07/17/2015   HGB 12.4* 07/17/2015   HCT 37.8* 07/17/2015   MCV 87.9 07/17/2015   PLT 289 07/17/2015   Lab Results  Component Value Date   INR 1.08 07/17/2015   INR 1.20 06/01/2015   05/2015 EKG: NSR  Anesthesia Physical Anesthesia Plan  ASA: III  Anesthesia Plan: Spinal   Post-op Pain Management:    Induction: Intravenous  Airway Management Planned: Natural Airway and Simple Face Mask  Additional Equipment:   Intra-op Plan:   Post-operative Plan:   Informed Consent: I have reviewed the patients History and Physical, chart, labs and discussed the procedure including the risks, benefits and alternatives for the proposed anesthesia with the patient or authorized representative who has indicated his/her understanding and acceptance.   Dental advisory  given  Plan Discussed with: CRNA  Anesthesia Plan Comments:         Anesthesia Quick Evaluation

## 2015-07-27 ENCOUNTER — Inpatient Hospital Stay (HOSPITAL_COMMUNITY): Payer: BLUE CROSS/BLUE SHIELD | Admitting: Anesthesiology

## 2015-07-27 ENCOUNTER — Encounter (HOSPITAL_COMMUNITY): Payer: Self-pay | Admitting: *Deleted

## 2015-07-27 ENCOUNTER — Inpatient Hospital Stay (HOSPITAL_COMMUNITY): Payer: BLUE CROSS/BLUE SHIELD | Admitting: Emergency Medicine

## 2015-07-27 ENCOUNTER — Encounter (HOSPITAL_COMMUNITY)
Admission: RE | Disposition: A | Discharge status: Home Health | Payer: Self-pay | Source: Ambulatory Visit | Attending: Orthopedic Surgery

## 2015-07-27 ENCOUNTER — Inpatient Hospital Stay (HOSPITAL_COMMUNITY)
Admission: RE | Admit: 2015-07-27 | Discharge: 2015-07-28 | DRG: 470 | Disposition: A | Discharge status: Home Health | Payer: BLUE CROSS/BLUE SHIELD | Source: Ambulatory Visit | Attending: Orthopedic Surgery | Admitting: Orthopedic Surgery

## 2015-07-27 DIAGNOSIS — F419 Anxiety disorder, unspecified: Secondary | ICD-10-CM | POA: Diagnosis present

## 2015-07-27 DIAGNOSIS — G4733 Obstructive sleep apnea (adult) (pediatric): Secondary | ICD-10-CM | POA: Diagnosis present

## 2015-07-27 DIAGNOSIS — Z9884 Bariatric surgery status: Secondary | ICD-10-CM

## 2015-07-27 DIAGNOSIS — G894 Chronic pain syndrome: Secondary | ICD-10-CM | POA: Diagnosis present

## 2015-07-27 DIAGNOSIS — M1712 Unilateral primary osteoarthritis, left knee: Principal | ICD-10-CM | POA: Diagnosis present

## 2015-07-27 DIAGNOSIS — Z87891 Personal history of nicotine dependence: Secondary | ICD-10-CM | POA: Diagnosis not present

## 2015-07-27 DIAGNOSIS — K219 Gastro-esophageal reflux disease without esophagitis: Secondary | ICD-10-CM | POA: Diagnosis present

## 2015-07-27 DIAGNOSIS — N400 Enlarged prostate without lower urinary tract symptoms: Secondary | ICD-10-CM | POA: Diagnosis present

## 2015-07-27 DIAGNOSIS — F319 Bipolar disorder, unspecified: Secondary | ICD-10-CM | POA: Diagnosis present

## 2015-07-27 DIAGNOSIS — Z79899 Other long term (current) drug therapy: Secondary | ICD-10-CM | POA: Diagnosis not present

## 2015-07-27 DIAGNOSIS — M25562 Pain in left knee: Secondary | ICD-10-CM | POA: Diagnosis present

## 2015-07-27 HISTORY — PX: TOTAL KNEE ARTHROPLASTY: SHX125

## 2015-07-27 SURGERY — ARTHROPLASTY, KNEE, TOTAL
Anesthesia: Spinal | Laterality: Left

## 2015-07-27 MED ORDER — OXYCODONE-ACETAMINOPHEN 5-325 MG PO TABS: 1.0000 | ORAL_TABLET | ORAL | Status: DC | PRN

## 2015-07-27 MED ORDER — DIPHENHYDRAMINE HCL 12.5 MG/5ML PO ELIX: 12.5000 mg | ORAL_SOLUTION | ORAL | Status: DC | PRN

## 2015-07-27 MED ORDER — METOCLOPRAMIDE HCL 5 MG/ML IJ SOLN: 5.0000 mg | Freq: Three times a day (TID) | INTRAMUSCULAR | Status: DC | PRN

## 2015-07-27 MED ORDER — METHOCARBAMOL 500 MG PO TABS: 500.0000 mg | ORAL_TABLET | Freq: Two times a day (BID) | ORAL | Status: DC

## 2015-07-27 MED ORDER — MEPERIDINE HCL 25 MG/ML IJ SOLN: 6.2500 mg | INTRAMUSCULAR | Status: DC | PRN

## 2015-07-27 MED ORDER — HYDROCODONE-ACETAMINOPHEN 7.5-325 MG/15ML PO SOLN
10.0000 mL | Freq: Three times a day (TID) | ORAL | Status: DC
  Administered 2015-07-27 – 2015-07-28 (×4): 10 mL via ORAL
  Filled 2015-07-27 (×4): qty 15

## 2015-07-27 MED ORDER — ACETAMINOPHEN 650 MG RE SUPP: 650.0000 mg | Freq: Four times a day (QID) | RECTAL | Status: DC | PRN

## 2015-07-27 MED ORDER — BUPIVACAINE HCL 0.5 % IJ SOLN
INTRAMUSCULAR | Status: DC | PRN
  Administered 2015-07-27: 20 mL

## 2015-07-27 MED ORDER — FENTANYL CITRATE (PF) 250 MCG/5ML IJ SOLN
INTRAMUSCULAR | Status: AC
  Filled 2015-07-27: qty 5

## 2015-07-27 MED ORDER — DEXAMETHASONE SODIUM PHOSPHATE 4 MG/ML IJ SOLN
INTRAMUSCULAR | Status: AC
  Filled 2015-07-27: qty 2

## 2015-07-27 MED ORDER — METHOCARBAMOL 1000 MG/10ML IJ SOLN
500.0000 mg | Freq: Four times a day (QID) | INTRAVENOUS | Status: DC | PRN
  Filled 2015-07-27: qty 5

## 2015-07-27 MED ORDER — METHOCARBAMOL 500 MG PO TABS
500.0000 mg | ORAL_TABLET | Freq: Four times a day (QID) | ORAL | Status: DC | PRN
  Administered 2015-07-28 (×3): 500 mg via ORAL
  Filled 2015-07-27 (×3): qty 1

## 2015-07-27 MED ORDER — BUPIVACAINE IN DEXTROSE 0.75-8.25 % IT SOLN
INTRATHECAL | Status: DC | PRN
  Administered 2015-07-27: 2 mL via INTRATHECAL

## 2015-07-27 MED ORDER — MIDAZOLAM HCL 5 MG/5ML IJ SOLN
INTRAMUSCULAR | Status: DC | PRN
  Administered 2015-07-27: 2 mg via INTRAVENOUS

## 2015-07-27 MED ORDER — HYDROMORPHONE HCL 1 MG/ML IJ SOLN
1.0000 mg | INTRAMUSCULAR | Status: DC | PRN
  Administered 2015-07-27 – 2015-07-28 (×8): 1 mg via INTRAVENOUS
  Filled 2015-07-27 (×8): qty 1

## 2015-07-27 MED ORDER — PROMETHAZINE HCL 25 MG/ML IJ SOLN: 6.2500 mg | INTRAMUSCULAR | Status: DC | PRN

## 2015-07-27 MED ORDER — FLEET ENEMA 7-19 GM/118ML RE ENEM: 1.0000 | ENEMA | Freq: Once | RECTAL | Status: DC | PRN

## 2015-07-27 MED ORDER — OXYCODONE HCL 5 MG PO TABS
5.0000 mg | ORAL_TABLET | ORAL | Status: DC | PRN
  Administered 2015-07-27 – 2015-07-28 (×5): 10 mg via ORAL
  Filled 2015-07-27 (×5): qty 2

## 2015-07-27 MED ORDER — PREDNISONE 10 MG PO TABS
10.0000 mg | ORAL_TABLET | ORAL | Status: DC
  Administered 2015-07-27: 10 mg via ORAL
  Filled 2015-07-27: qty 1

## 2015-07-27 MED ORDER — HYDROMORPHONE HCL 1 MG/ML IJ SOLN
INTRAMUSCULAR | Status: AC
  Filled 2015-07-27: qty 1

## 2015-07-27 MED ORDER — PROPOFOL 10 MG/ML IV BOLUS
INTRAVENOUS | Status: DC | PRN
  Administered 2015-07-27 (×4): 10 mg via INTRAVENOUS
  Administered 2015-07-27: 20 mg via INTRAVENOUS

## 2015-07-27 MED ORDER — FENTANYL CITRATE (PF) 100 MCG/2ML IJ SOLN
INTRAMUSCULAR | Status: DC | PRN
  Administered 2015-07-27: 25 ug via INTRAVENOUS
  Administered 2015-07-27 (×2): 50 ug via INTRAVENOUS
  Administered 2015-07-27: 25 ug via INTRAVENOUS
  Administered 2015-07-27: 50 ug via INTRAVENOUS
  Administered 2015-07-27 (×2): 25 ug via INTRAVENOUS

## 2015-07-27 MED ORDER — LIDOCAINE HCL (CARDIAC) 20 MG/ML IV SOLN
INTRAVENOUS | Status: DC | PRN
  Administered 2015-07-27: 30 mg via INTRATRACHEAL

## 2015-07-27 MED ORDER — PHENOL 1.4 % MT LIQD: 1.0000 | OROMUCOSAL | Status: DC | PRN

## 2015-07-27 MED ORDER — TRANEXAMIC ACID 1000 MG/10ML IV SOLN
2000.0000 mg | INTRAVENOUS | Status: DC
Start: 2015-07-27 — End: 2015-07-27
  Filled 2015-07-27: qty 20

## 2015-07-27 MED ORDER — LIDOCAINE HCL (CARDIAC) 20 MG/ML IV SOLN
INTRAVENOUS | Status: AC
  Filled 2015-07-27: qty 5

## 2015-07-27 MED ORDER — METOCLOPRAMIDE HCL 5 MG PO TABS: 5.0000 mg | ORAL_TABLET | Freq: Three times a day (TID) | ORAL | Status: DC | PRN

## 2015-07-27 MED ORDER — ALPRAZOLAM 1 MG PO TABS
1.0000 mg | ORAL_TABLET | Freq: Three times a day (TID) | ORAL | Status: DC | PRN
  Administered 2015-07-27: 1 mg via ORAL
  Filled 2015-07-27: qty 1
  Filled 2015-07-27: qty 2

## 2015-07-27 MED ORDER — PROPOFOL 500 MG/50ML IV EMUL
INTRAVENOUS | Status: DC | PRN
  Administered 2015-07-27: 75 ug/kg/min via INTRAVENOUS

## 2015-07-27 MED ORDER — BUPIVACAINE-EPINEPHRINE (PF) 0.5% -1:200000 IJ SOLN
INTRAMUSCULAR | Status: DC | PRN
  Administered 2015-07-27: 30 mL via PERINEURAL

## 2015-07-27 MED ORDER — ONDANSETRON HCL 4 MG PO TABS: 4.0000 mg | ORAL_TABLET | Freq: Four times a day (QID) | ORAL | Status: DC | PRN

## 2015-07-27 MED ORDER — ACETAMINOPHEN 325 MG PO TABS
650.0000 mg | ORAL_TABLET | Freq: Four times a day (QID) | ORAL | Status: DC | PRN
  Administered 2015-07-28: 650 mg via ORAL
  Filled 2015-07-27: qty 2

## 2015-07-27 MED ORDER — TRANEXAMIC ACID 1000 MG/10ML IV SOLN
1732.5000 mg | INTRAVENOUS | Status: DC | PRN
  Administered 2015-07-27: 1732.5 mg via INTRAVENOUS

## 2015-07-27 MED ORDER — SODIUM CHLORIDE 0.9 % IR SOLN
Status: DC | PRN
  Administered 2015-07-27: 1000 mL
  Administered 2015-07-27: 3000 mL

## 2015-07-27 MED ORDER — MENTHOL 3 MG MT LOZG: 1.0000 | LOZENGE | OROMUCOSAL | Status: DC | PRN

## 2015-07-27 MED ORDER — LORAZEPAM 2 MG/ML IJ SOLN
1.0000 mg | Freq: Once | INTRAMUSCULAR | Status: AC
  Administered 2015-07-27: 1 mg via INTRAVENOUS

## 2015-07-27 MED ORDER — ONDANSETRON HCL 4 MG/2ML IJ SOLN: 4.0000 mg | Freq: Four times a day (QID) | INTRAMUSCULAR | Status: DC | PRN

## 2015-07-27 MED ORDER — BUPIVACAINE HCL (PF) 0.5 % IJ SOLN
INTRAMUSCULAR | Status: AC
  Filled 2015-07-27: qty 30

## 2015-07-27 MED ORDER — KCL IN DEXTROSE-NACL 20-5-0.45 MEQ/L-%-% IV SOLN
INTRAVENOUS | Status: DC
  Administered 2015-07-27: 14:00:00 via INTRAVENOUS
  Filled 2015-07-27 (×2): qty 1000

## 2015-07-27 MED ORDER — TRANEXAMIC ACID 650 MG PO TABS
1300.0000 mg | ORAL_TABLET | Freq: Once | ORAL | Status: AC
  Administered 2015-07-27: 1300 mg via ORAL
  Filled 2015-07-27: qty 2

## 2015-07-27 MED ORDER — MIDAZOLAM HCL 2 MG/2ML IJ SOLN
INTRAMUSCULAR | Status: AC
  Filled 2015-07-27: qty 2

## 2015-07-27 MED ORDER — SODIUM CHLORIDE 0.9 % IJ SOLN
INTRAMUSCULAR | Status: DC | PRN
  Administered 2015-07-27: 20 mL via INTRAVENOUS

## 2015-07-27 MED ORDER — HYDROCODONE BITARTRATE ER 30 MG PO T24A: 1.0000 | EXTENDED_RELEASE_TABLET | Freq: Every day | ORAL | Status: DC

## 2015-07-27 MED ORDER — ONDANSETRON HCL 4 MG/2ML IJ SOLN
INTRAMUSCULAR | Status: AC
  Filled 2015-07-27: qty 2

## 2015-07-27 MED ORDER — SENNOSIDES-DOCUSATE SODIUM 8.6-50 MG PO TABS: 1.0000 | ORAL_TABLET | Freq: Every evening | ORAL | Status: DC | PRN

## 2015-07-27 MED ORDER — LACTATED RINGERS IV SOLN: INTRAVENOUS | Status: DC

## 2015-07-27 MED ORDER — ASPIRIN EC 325 MG PO TBEC: 325.0000 mg | DELAYED_RELEASE_TABLET | Freq: Two times a day (BID) | ORAL | Status: DC

## 2015-07-27 MED ORDER — ALUM & MAG HYDROXIDE-SIMETH 200-200-20 MG/5ML PO SUSP
30.0000 mL | ORAL | Status: DC | PRN
  Administered 2015-07-27: 30 mL via ORAL
  Filled 2015-07-27 (×2): qty 30

## 2015-07-27 MED ORDER — LACTATED RINGERS IV SOLN
INTRAVENOUS | Status: DC | PRN
  Administered 2015-07-27: 07:00:00 via INTRAVENOUS

## 2015-07-27 MED ORDER — DOCUSATE SODIUM 100 MG PO CAPS
100.0000 mg | ORAL_CAPSULE | Freq: Two times a day (BID) | ORAL | Status: DC
  Administered 2015-07-27 – 2015-07-28 (×2): 100 mg via ORAL
  Filled 2015-07-27 (×3): qty 1

## 2015-07-27 MED ORDER — PROPOFOL 10 MG/ML IV BOLUS
INTRAVENOUS | Status: AC
  Filled 2015-07-27: qty 20

## 2015-07-27 MED ORDER — QUETIAPINE FUMARATE 300 MG PO TABS
300.0000 mg | ORAL_TABLET | Freq: Every day | ORAL | Status: DC
  Administered 2015-07-27: 300 mg via ORAL
  Filled 2015-07-27 (×2): qty 2

## 2015-07-27 MED ORDER — HYDROMORPHONE HCL 1 MG/ML IJ SOLN
0.2500 mg | INTRAMUSCULAR | Status: DC | PRN
  Administered 2015-07-27 (×2): 0.5 mg via INTRAVENOUS

## 2015-07-27 MED ORDER — LORAZEPAM 2 MG/ML IJ SOLN
INTRAMUSCULAR | Status: AC
  Filled 2015-07-27: qty 1

## 2015-07-27 MED ORDER — ASPIRIN EC 325 MG PO TBEC
325.0000 mg | DELAYED_RELEASE_TABLET | Freq: Every day | ORAL | Status: DC
  Administered 2015-07-28: 325 mg via ORAL
  Filled 2015-07-27: qty 1

## 2015-07-27 MED ORDER — BISACODYL 5 MG PO TBEC: 5.0000 mg | DELAYED_RELEASE_TABLET | Freq: Every day | ORAL | Status: DC | PRN

## 2015-07-27 SURGICAL SUPPLY — 58 items
BANDAGE ESMARK 6X9 LF (GAUZE/BANDAGES/DRESSINGS) ×1 IMPLANT
BLADE SAG 18X100X1.27 (BLADE) ×2 IMPLANT
BLADE SAW SGTL 13X75X1.27 (BLADE) ×2 IMPLANT
BLADE SURG ROTATE 9660 (MISCELLANEOUS) IMPLANT
BNDG CMPR 9X6 STRL LF SNTH (GAUZE/BANDAGES/DRESSINGS) ×1
BNDG CMPR MED 10X6 ELC LF (GAUZE/BANDAGES/DRESSINGS) ×1
BNDG ELASTIC 6X10 VLCR STRL LF (GAUZE/BANDAGES/DRESSINGS) ×2 IMPLANT
BNDG ESMARK 6X9 LF (GAUZE/BANDAGES/DRESSINGS) ×2
BOWL SMART MIX CTS (DISPOSABLE) ×2 IMPLANT
CAPT KNEE TOTAL 3 ATTUNE ×1 IMPLANT
CEMENT HV SMART SET (Cement) ×4 IMPLANT
COVER SURGICAL LIGHT HANDLE (MISCELLANEOUS) ×2 IMPLANT
CUFF TOURNIQUET SINGLE 34IN LL (TOURNIQUET CUFF) ×2 IMPLANT
CUFF TOURNIQUET SINGLE 44IN (TOURNIQUET CUFF) IMPLANT
DRAPE EXTREMITY T 121X128X90 (DRAPE) ×2 IMPLANT
DRAPE U-SHAPE 47X51 STRL (DRAPES) ×2 IMPLANT
DRESSING AQUACEL AG SP 3.5X10 (GAUZE/BANDAGES/DRESSINGS) IMPLANT
DRSG AQUACEL AG ADV 3.5X10 (GAUZE/BANDAGES/DRESSINGS) ×2 IMPLANT
DRSG AQUACEL AG SP 3.5X10 (GAUZE/BANDAGES/DRESSINGS) ×2
DURAPREP 26ML APPLICATOR (WOUND CARE) ×4 IMPLANT
ELECT REM PT RETURN 9FT ADLT (ELECTROSURGICAL) ×2
ELECTRODE REM PT RTRN 9FT ADLT (ELECTROSURGICAL) ×1 IMPLANT
EVACUATOR 1/8 PVC DRAIN (DRAIN) IMPLANT
GLOVE BIO SURGEON STRL SZ7.5 (GLOVE) ×3 IMPLANT
GLOVE BIO SURGEON STRL SZ8.5 (GLOVE) ×2 IMPLANT
GLOVE BIOGEL PI IND STRL 8 (GLOVE) ×1 IMPLANT
GLOVE BIOGEL PI IND STRL 9 (GLOVE) ×1 IMPLANT
GLOVE BIOGEL PI INDICATOR 8 (GLOVE) ×2
GLOVE BIOGEL PI INDICATOR 9 (GLOVE) ×1
GOWN STRL REUS W/ TWL LRG LVL3 (GOWN DISPOSABLE) ×1 IMPLANT
GOWN STRL REUS W/ TWL XL LVL3 (GOWN DISPOSABLE) ×2 IMPLANT
GOWN STRL REUS W/TWL LRG LVL3 (GOWN DISPOSABLE) ×2
GOWN STRL REUS W/TWL XL LVL3 (GOWN DISPOSABLE) ×4
HANDPIECE INTERPULSE COAX TIP (DISPOSABLE) ×2
HOOD PEEL AWAY FACE SHEILD DIS (HOOD) ×5 IMPLANT
IV NS IRRIG 3000ML ARTHROMATIC (IV SOLUTION) ×1 IMPLANT
KIT BASIN OR (CUSTOM PROCEDURE TRAY) ×2 IMPLANT
KIT ROOM TURNOVER OR (KITS) ×2 IMPLANT
MANIFOLD NEPTUNE II (INSTRUMENTS) ×2 IMPLANT
NDL SPNL 18GX3.5 QUINCKE PK (NEEDLE) IMPLANT
NEEDLE SPNL 18GX3.5 QUINCKE PK (NEEDLE) IMPLANT
NS IRRIG 1000ML POUR BTL (IV SOLUTION) ×2 IMPLANT
PACK TOTAL JOINT (CUSTOM PROCEDURE TRAY) ×2 IMPLANT
PAD ARMBOARD 7.5X6 YLW CONV (MISCELLANEOUS) ×4 IMPLANT
SET HNDPC FAN SPRY TIP SCT (DISPOSABLE) ×1 IMPLANT
SUT VIC AB 0 CT1 27 (SUTURE) ×2
SUT VIC AB 0 CT1 27XBRD ANBCTR (SUTURE) ×1 IMPLANT
SUT VIC AB 1 CTX 36 (SUTURE) ×2
SUT VIC AB 1 CTX36XBRD ANBCTR (SUTURE) ×1 IMPLANT
SUT VIC AB 2-0 CT1 27 (SUTURE)
SUT VIC AB 2-0 CT1 TAPERPNT 27 (SUTURE) IMPLANT
SUT VIC AB 3-0 CT1 27 (SUTURE) ×2
SUT VIC AB 3-0 CT1 TAPERPNT 27 (SUTURE) ×1 IMPLANT
SYR 50ML LL SCALE MARK (SYRINGE) ×2 IMPLANT
TOWEL OR 17X24 6PK STRL BLUE (TOWEL DISPOSABLE) ×2 IMPLANT
TOWEL OR 17X26 10 PK STRL BLUE (TOWEL DISPOSABLE) ×2 IMPLANT
TRAY CATH 16FR W/PLASTIC CATH (SET/KITS/TRAYS/PACK) IMPLANT
WATER STERILE IRR 1000ML POUR (IV SOLUTION) ×6 IMPLANT

## 2015-07-27 NOTE — Discharge Instructions (Signed)

## 2015-07-27 NOTE — Op Note (Signed)
PATIENT ID:      [name]  MRN:     [id number] DOB/AGE:    1960-10-03 / 55 y.o.       OPERATIVE REPORT    DATE OF PROCEDURE:  07/27/2015       PREOPERATIVE DIAGNOSIS:   LEFT KNEE OSTEOARTHRITIS      Estimated body mass index is 31.87 kg/(m^2) as calculated from the following:   Height as of 07/17/15: 6\' 3"  (1.905 m).   Weight as of 07/06/15: 115.667 kg (255 lb).                                                        POSTOPERATIVE DIAGNOSIS:   LEFT KNEE OSTEOARTHRITIS                                                                      PROCEDURE:  Procedure(s): TOTAL KNEE ARTHROPLASTY Using DepuyAttune RP implants #8L Femur, #10Tibia, 5 mm Attune RP bearing, 41 Patella     SURGEON: Akelia Husted J    ASSISTANT:   [employer]   (Present and scrubbed throughout the case, critical for assistance with exposure, retraction, instrumentation, and closure.)         ANESTHESIA: Spinal, 20cc Exparel, 20cc 0.5% Marcaine  EBL: 400  FLUID REPLACEMENT: 1700 crystalloid  TOURNIQUET TIME: 65min  Drains: None  Tranexamic Acid: 1gm iv 2gm topical   COMPLICATIONS:  None         INDICATIONS FOR PROCEDURE: The patient has  LEFT KNEE OSTEOARTHRITIS, Val deformities, XR shows bone on bone arthritis, lateral subluxation of tibia. Patient has failed all conservative measures including anti-inflammatory medicines, narcotics, attempts at  exercise and weight loss, cortisone injections and viscosupplementation.  Risks and benefits of surgery have been discussed, questions answered.   DESCRIPTION OF PROCEDURE: The patient identified by armband, received  IV antibiotics, in the holding area at [name]. Patient taken to the operating room, appropriate anesthetic  monitors were attached, and Spinal anesthesia was  induced. Tourniquet  applied high to the operative thigh. Lateral post and foot positioner  applied to the table, the lower extremity was then prepped and draped  in usual sterile  fashion from the toes to the tourniquet. Time-out procedure was performed. We began the operation, with the knee flexed 120 degrees, by making the anterior midline incision starting at handbreadth above the patella going over the patella 1 cm medial to and 4 cm distal to the tibial tubercle. Small bleeders in the skin and the  subcutaneous tissue identified and cauterized. Transverse retinaculum was incised and reflected medially and a medial parapatellar arthrotomy was accomplished. the patella was everted and theprepatellar fat pad resected. The superficial medial collateral  ligament was then elevated from anterior to posterior along the proximal  flare of the tibia and anterior half of the menisci resected. The knee was hyperflexed exposing bone on bone arthritis. Peripheral and notch osteophytes as well as the cruciate ligaments were then resected. We continued to  work our way around posteriorly along the proximal tibia, and  externally  rotated the tibia subluxing it out from underneath the femur. A McHale  retractor was placed through the notch and a lateral Hohmann retractor  placed, and we then drilled through the proximal tibia in line with the  axis of the tibia followed by an intramedullary guide rod and 2-degree  posterior slope cutting guide. The tibial cutting guide, 3 degree posterior sloped, was pinned into place allowing resection of 3 mm of bone medially and 4 mm of bone laterally. Satisfied with the tibial resection, we then  entered the distal femur 2 mm anterior to the PCL origin with the  intramedullary guide rod and applied the distal femoral cutting guide  set at 9 mm, with 5 degrees of valgus. This was pinned along the  epicondylar axis. At this point, the distal femoral cut was accomplished without difficulty. We then sized for a #8L femoral component and pinned the guide in 0 degrees of external rotation. The chamfer cutting guide was pinned into place. The anterior,  posterior, and chamfer cuts were accomplished without difficulty followed by  the Attune RP box cutting guide and the box cut. We also removed posterior osteophytes from the posterior femoral condyles. At this  time, the knee was brought into full extension. We checked our  extension and flexion gaps and found them symmetric for a 6 mm bearing. Distracting in extension with a lamina spreader, the posterior horns of the menisci were removed, and Exparel, diluted to 60 cc, with 20cc NS, and 20cc 0.5% Marcaine,was injected into the capsule and synovium of the knee. The posterior patella cut was accomplished with the 9.5 mm Attune cutting guide, sized for a 31mm dome, and the fixation pegs drilled.The knee  was then once again hyperflexed exposing the proximal tibia. We sized for a # 10 tibial base plate, applied the smokestack and the conical reamer followed by the the Delta fin keel punch. We then hammered into place the Attune RP trial femoral component, drilled the lugs, inserted a  6 mm trial bearing, trial patellar button, and took the knee through range of motion from 0-130 degrees. No thumb pressure was required for patellar Tracking. At this point, the limb was wrapped with an Esmarch bandage and the tourniquet inflated to 350 mmHg. All trial components were removed, mating surfaces irrigated with pulse lavage, and dried with suction and sponges. A double batch of DePuy HV cement with 1500 mg of Zinacef was mixed and applied to all bony metallic mating surfaces except for the posterior condyles of the femur itself. In order, we  hammered into place the tibial tray and removed excess cement, the femoral component and removed excess cement. The final Attune RP bearing  was inserted, and the knee brought to full extension with compression.  The patellar button was clamped into place, and excess cement  removed. While the cement cured the wound was irrigated out with normal saline solution pulse lavage.  Ligament stability and patellar tracking were checked and found to be excellent. The parapatellar arthrotomy was closed with  running #1 Vicryl suture. The subcutaneous tissue with 0 and 2-0 undyed  Vicryl suture, and the skin with running 3-0 SQ vicryl. A dressing of Xeroform,  4 x 4, dressing sponges, Webril, and Ace wrap applied. The patient  awakened, and taken to recovery room without difficulty.   Nima Bamburg J 07/27/2015, 9:14 AM

## 2015-07-27 NOTE — Evaluation (Signed)
Physical Therapy Evaluation Patient Details Name: [name] MRN: [id number] DOB: [birth year] Today's Date: 07/27/2015   History of Present Illness  55 yo admitted for L TKA. PMHx: bipolar, lung mass, OA, anxiety, chronic pain, DDD  Clinical Impression  Pt moving exceptionally well for POD#0. Pt with decreased strength, ROM, transfers and gait who will benefit from acute therapy to maximize mobility, function and independence for return home. Pt educated for knee precautions, CPM use, bone foam (in use end of session), HEP with handout, transfers and gait.     Follow Up Recommendations Home health PT    Equipment Recommendations  None recommended by PT    Recommendations for Other Services OT consult     Precautions / Restrictions Precautions Precautions: Knee Restrictions Weight Bearing Restrictions: Yes LLE Weight Bearing: Weight bearing as tolerated      Mobility  Bed Mobility Overal bed mobility: Modified Independent             General bed mobility comments: pt using bil UE to move LLE  Transfers Overall transfer level: Needs assistance   Transfers: Sit to/from Stand Sit to Stand: Min guard         General transfer comment: cues for hand placement , sequence and safety  Ambulation/Gait Ambulation/Gait assistance: Min guard Ambulation Distance (Feet): 75 Feet Assistive device: Rolling walker (2 wheeled) Gait Pattern/deviations: Step-through pattern;Decreased stride length   Gait velocity interpretation: Below normal speed for age/gender General Gait Details: cues for posture, sequence, safety and increased dorsiflexion LLE  Stairs            Wheelchair Mobility    Modified Rankin (Stroke Patients Only)       Balance                                             Pertinent Vitals/Pain Pain Assessment: 0-10 Pain Score: 5  Pain Location: left knee Pain Descriptors / Indicators: Aching Pain Intervention(s): Limited  activity within patient's tolerance;Monitored during session;Premedicated before session;Repositioned    Home Living Family/patient expects to be discharged to:: Private residence Living Arrangements: Spouse/significant other Available Help at Discharge: Family;Available 24 hours/day Type of Home: House Home Access: Stairs to enter   [employer] of Steps: 1 Home Layout: One level Home Equipment: Walker - 2 wheels;Bedside commode;Cane - single point;Shower seat      Prior Function Level of Independence: Independent               Hand Dominance        Extremity/Trunk Assessment   Upper Extremity Assessment: Overall WFL for tasks assessed           Lower Extremity Assessment: LLE deficits/detail   LLE Deficits / Details: decreased ROM and strength as expected post op  Cervical / Trunk Assessment: Normal  Communication   Communication: No difficulties  Cognition Arousal/Alertness: Awake/alert Behavior During Therapy: WFL for tasks assessed/performed Overall Cognitive Status: Within Functional Limits for tasks assessed                      General Comments      Exercises Total Joint Exercises Heel Slides: AAROM;Left;5 reps;Supine Straight Leg Raises: AAROM;Left;5 reps;Supine      Assessment/Plan    PT Assessment Patient needs continued PT services  PT Diagnosis Difficulty walking;Acute pain   PT Problem List Decreased strength;Decreased range of  motion;Decreased activity tolerance;Decreased mobility;Pain;Decreased knowledge of use of DME  PT Treatment Interventions DME instruction;Gait training;Stair training;Functional mobility training;Therapeutic activities;Therapeutic exercise;Patient/family education   PT Goals (Current goals can be found in the Care Plan section) Acute Rehab PT Goals 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 PT Goal Formulation: With patient Time For Goal Achievement: 08/03/15 Potential to Achieve  Goals: Good    Frequency 7X/week   Barriers to discharge        Co-evaluation               End of Session Equipment Utilized During Treatment: Gait belt Activity Tolerance: Patient tolerated treatment well Patient left: in chair;with call bell/phone within reach;with chair alarm set;with nursing/sitter in room Nurse Communication: Mobility status         Time: 1354-1420 PT Time Calculation (min) (ACUTE ONLY): 26 min   Charges:   PT Evaluation $PT Eval Moderate Complexity: 1 Procedure PT Treatments $Gait Training: 8-22 mins   PT G CodesMelford Aase 07/27/2015, 2:26 PM Elwyn Reach, [place]

## 2015-07-27 NOTE — Anesthesia Postprocedure Evaluation (Signed)
Anesthesia Post Note  Patient: [name]  Procedure(s) Performed: Procedure(s) (LRB): TOTAL KNEE ARTHROPLASTY (Left)  Patient location during evaluation: PACU Anesthesia Type: Spinal Level of consciousness: oriented and awake and alert Pain management: pain level controlled Vital Signs Assessment: post-procedure vital signs reviewed and stable Respiratory status: spontaneous breathing, respiratory function stable and patient connected to nasal cannula oxygen Cardiovascular status: blood pressure returned to baseline and stable Postop Assessment: no headache, no backache and spinal receding Anesthetic complications: no    Last Vitals:  Filed Vitals:   07/27/15 1030 07/27/15 1045  BP: 119/84 128/82  Pulse: 66 61  Temp:    Resp: 13 11    Last Pain:  Filed Vitals:   07/27/15 1059  PainSc: [place] Osei Anger

## 2015-07-27 NOTE — Transfer of Care (Signed)
Immediate Anesthesia Transfer of Care Note  Patient: [name]  Procedure(s) Performed: Procedure(s): TOTAL KNEE ARTHROPLASTY (Left)  Patient Location: PACU  Anesthesia Type:General  Level of Consciousness: awake, alert , oriented and patient cooperative  Airway & Oxygen Therapy: Patient Spontanous Breathing and Patient connected to nasal cannula oxygen  Post-op Assessment: Report given to RN and Post -op Vital signs reviewed and stable  Post vital signs: Reviewed and stable  Last Vitals:  Filed Vitals:   07/27/15 0703 07/27/15 0704  BP:    Pulse: 66 71  Temp:    Resp: 12 11    Complications: No apparent anesthesia complications

## 2015-07-27 NOTE — Progress Notes (Signed)
Utilization review completed. Petar Mucci, RN, [name]. 

## 2015-07-27 NOTE — Anesthesia Procedure Notes (Addendum)
Anesthesia Regional Block:  Adductor canal block  Pre-Anesthetic Checklist: ,, timeout performed, Correct Patient, Correct Site, Correct Laterality, Correct Procedure, Correct Position, site marked, Risks and benefits discussed,  Surgical consent,  Pre-op evaluation,  At surgeon's request and post-op pain management  Laterality: Left  Prep: chloraprep       Needles:  Injection technique: Single-shot  Needle Type: Echogenic Needle     Needle Length: 9cm 9 cm Needle Gauge: 21 and 21 G    Additional Needles:  Procedures: ultrasound guided (picture in chart) Adductor canal block Narrative:  Start time: 07/27/2015 7:10 AM End time: 07/27/2015 7:13 AM Injection made incrementally with aspirations every 5 mL.  Performed by: Personally  Anesthesiologist: Suella Broad D  Additional Notes: No immediate complications noted.    Spinal Patient location during procedure: OR Start time: 07/27/2015 7:30 AM End time: 07/27/2015 7:33 AM Staffing Anesthesiologist: Suella Broad D Performed by: anesthesiologist  Preanesthetic Checklist Completed: patient identified, site marked, surgical consent, pre-op evaluation, timeout performed, IV checked, risks and benefits discussed and monitors and equipment checked Spinal Block Patient position: sitting Prep: Betadine Patient monitoring: heart rate, continuous pulse ox, blood pressure and cardiac monitor Approach: midline Location: L4-5 Injection technique: single-shot Needle Needle type: Whitacre and Introducer  Needle gauge: 24 G Needle length: 9 cm Additional Notes Negative paresthesia. Negative blood return. Positive free-flowing CSF. Expiration date of kit checked and confirmed. Patient tolerated procedure well, without complications.

## 2015-07-27 NOTE — Progress Notes (Signed)
Orthopedic Tech Progress Note Patient Details:  [name] Jul 24, 1960 [id number]  CPM Left Knee CPM Left Knee: On Left Knee Flexion (Degrees): 40 Left Knee Extension (Degrees): 0 Additional Comments: trapeze bar patient helper Viewed order from doctor's order list  Parson, [place] 07/27/2015, 10:49 AM

## 2015-07-27 NOTE — Care Management Note (Signed)
Case Management Note  Patient Details  Name: LESEAN KENIMER MRN: [id number] Date of Birth: 09-18-1960  Subjective/Objective:   55 yr old male s/p left total knee arthroplasty.                 Action/Plan: Case manager spoke with patient and his wife concerning home health and DME needs at discharge. Patient states he is not sure who he was setup with for home health. CM has placed call to Christa See with [employer] because patient says he thinks they are the company. CM will continue to monitor. Patient states he has a rolling walker, 3in1 , shower chair, cane and CPM.    Expected Discharge Date:                  Expected Discharge Plan:  [place]  In-House Referral:     Discharge planning Services  CM Consult  Post Acute Care Choice:    Choice offered to:  Patient, Spouse  DME Arranged:   NA DME Agency:     HH Arranged:  PT HH Agency:     Status of Service:  In process, will continue to follow  Medicare Important Message Given:    Date Medicare IM Given:    Medicare IM give by:    Date Additional Medicare IM Given:    Additional Medicare Important Message give by:     If discussed at [place] of Stay Meetings, dates discussed:    Additional Comments:  Ninfa Meeker, RN 07/27/2015, 4:27 PM

## 2015-07-27 NOTE — Interval H&P Note (Signed)
History and Physical Interval Note:  07/27/2015 7:14 AM  [name]  has presented today for surgery, with the diagnosis of LEFT KNEE OSTEOARTHRITIS  The various methods of treatment have been discussed with the patient and family. After consideration of risks, benefits and other options for treatment, the patient has consented to  Procedure(s): TOTAL KNEE ARTHROPLASTY (Left) as a surgical intervention .  The patient's history has been reviewed, patient examined, no change in status, stable for surgery.  I have reviewed the patient's chart and labs.  Questions were answered to the patient's satisfaction.     Kerin Salen

## 2015-07-27 NOTE — OR Nursing (Signed)
Pt found to have >850 ml in bladder, urinary catheter placed and 1200 ml Uop out within 2 min.  Catheter clamped from release of 1200 until 1300. 300 ml observed in output after release.

## 2015-07-28 LAB — BASIC METABOLIC PANEL
ANION GAP: 6 (ref 5–15)
BUN: 9 mg/dL (ref 6–20)
CALCIUM: 8.6 mg/dL — AB (ref 8.9–10.3)
CO2: 28 mmol/L (ref 22–32)
Chloride: 104 mmol/L (ref 101–111)
Creatinine, Ser: 0.74 mg/dL (ref 0.61–1.24)
Glucose, Bld: 130 mg/dL — ABNORMAL HIGH (ref 65–99)
POTASSIUM: 4.1 mmol/L (ref 3.5–5.1)
Sodium: 138 mmol/L (ref 135–145)

## 2015-07-28 LAB — CBC
HCT: 34 % — ABNORMAL LOW (ref 39.0–52.0)
HEMOGLOBIN: 10.8 g/dL — AB (ref 13.0–17.0)
MCH: 27.6 pg (ref 26.0–34.0)
MCHC: 31.8 g/dL (ref 30.0–36.0)
MCV: 87 fL (ref 78.0–100.0)
Platelets: 250 10*3/uL (ref 150–400)
RBC: 3.91 MIL/uL — ABNORMAL LOW (ref 4.22–5.81)
RDW: 16.5 % — AB (ref 11.5–15.5)
WBC: 8.7 10*3/uL (ref 4.0–10.5)

## 2015-07-28 MED ORDER — OXYCODONE HCL 5 MG PO TABS
5.0000 mg | ORAL_TABLET | ORAL | Status: DC | PRN
  Administered 2015-07-28 (×3): 15 mg via ORAL
  Filled 2015-07-28 (×3): qty 3

## 2015-07-28 MED ORDER — MORPHINE SULFATE ER 15 MG PO TBCR
15.0000 mg | EXTENDED_RELEASE_TABLET | Freq: Two times a day (BID) | ORAL | Status: DC
  Administered 2015-07-28: 15 mg via ORAL
  Filled 2015-07-28: qty 1

## 2015-07-28 NOTE — Progress Notes (Addendum)
Subjective: 2-24: L TKA Sitting in chair, observed to be in pain with face grimacing and some writhing (I was in hallway, patient did not see me) Tol PO, attempting not to have any more IV pain med PT difficult due to pain   Objective: Vital signs in last 24 hours: Temp:  [97.7 F (36.5 C)-98.5 F (36.9 C)] 98.4 F (36.9 C) (02/25 0544) Pulse Rate:  [61-101] 81 (02/25 0544) Resp:  [9-21] 18 (02/25 0544) BP: (115-149)/(73-107) 134/82 mmHg (02/25 0544) SpO2:  [95 %-100 %] 98 % (02/25 0544)  Intake/Output from previous day: 02/24 0701 - 02/25 0700 In: 2962.9 [P.O.:580; I.V.:2272.9; IV Piggyback:110] Out: 8020 [Urine:7800; Blood:220] Intake/Output this shift: Total I/O In: -  Out: 300 [Urine:300]   Recent Labs  07/28/15 0339  HGB 10.8*    Recent Labs  07/28/15 0339  WBC 8.7  RBC 3.91*  HCT 34.0*  PLT 250    Recent Labs  07/28/15 0339  NA 138  K 4.1  CL 104  CO2 28  BUN 9  CREATININE 0.74  GLUCOSE 130*  CALCIUM 8.6*   No results for input(s): LABPT, INR in the last 72 hours.  Dressing clean externally, flex/ext toes/ankle, intact LT sens P/D/1Web  Assessment/Plan: VTE: ASA and SCD Normally takes Hysingla at home as part of pain regimen, in addition to fill-in norco. Will add MS Contin today as a replacement, with standing Hycet as well and PRN Oxycodone to try to approximate home regimen with some added Oxycodone for breakthru PT D/C home once clears PT   Torina Ey A. 07/28/2015, 10:01 AM

## 2015-07-28 NOTE — Progress Notes (Signed)
Patient and wife provided with prescriptions and discharge instructions.  No questions at time of discharge.  IV removed.  Escorted via wheelchair to vehicle.  Disposition is to home.

## 2015-07-28 NOTE — Progress Notes (Signed)
Physical Therapy Progress Note Pt with continued progression with gait, transfers and able to complete HEP this session. Pt educated for CPM, bone foam and HEp with all questions answered for safe D/C. Pt safe for return home.    07/28/15 1227  PT Visit Information  Last PT Received On 07/28/15  Assistance Needed +1  History of Present Illness 55 yo admitted for L TKA. PMHx: bipolar, lung mass, OA, anxiety, chronic pain, DDD  PT Time Calculation  PT Start Time (ACUTE ONLY) 1149  PT Stop Time (ACUTE ONLY) 1220  PT Time Calculation (min) (ACUTE ONLY) 31 min  Precautions  Precautions Knee  Restrictions  LLE Weight Bearing WBAT  Pain Assessment  Pain Score 6  Pain Location left knee  Pain Descriptors / Indicators Aching;Burning  Pain Intervention(s) Limited activity within patient's tolerance;Monitored during session;Premedicated before session;Repositioned;Ice applied  Cognition  Arousal/Alertness Awake/alert  Behavior During Therapy WFL for tasks assessed/performed  Overall Cognitive Status Within Functional Limits for tasks assessed  Bed Mobility  Overal bed mobility Modified Independent  Transfers  Overall transfer level Needs assistance  Sit to Stand Supervision  General transfer comment cues for hand placement   Ambulation/Gait  Ambulation/Gait assistance Supervision  Ambulation Distance (Feet) 150 Feet  Assistive device Rolling walker (2 wheeled)  Gait Pattern/deviations Step-to pattern;Decreased stance time - left;Decreased dorsiflexion - left  General Gait Details cues for posture, sequence, safety and increased dorsiflexion LLE  Gait velocity interpretation Below normal speed for age/gender  Exercises  Exercises Total Joint  Total Joint Exercises  Heel Slides AAROM;Left;10 reps;Supine  Hip ABduction/ADduction AAROM;Left;10 reps;Supine  Straight Leg Raises AROM;Left;10 reps;Supine  Short Arc Quad AROM;Left;10 reps;Seated  Goniometric ROM 8-60  PT - End of Session   Activity Tolerance Patient tolerated treatment well  Patient left in bed;with call bell/phone within reach  Nurse Communication Mobility status  PT - Assessment/Plan  PT Plan Current plan remains appropriate  Follow Up Recommendations Home health PT  PT Goal Progression  Progress towards PT goals Progressing toward goals  PT General Charges  $$ ACUTE PT VISIT 1 Procedure  PT Treatments  $Gait Training 8-22 mins  $Therapeutic Exercise 8-22 mins  Elwyn Reach, [place]

## 2015-07-28 NOTE — Progress Notes (Signed)
Physical Therapy Treatment Patient Details Name: [name] MRN: [id number] DOB: 1961-02-19 Today's Date: 2015/08/27    History of Present Illness 55 yo admitted for L TKA. PMHx: bipolar, lung mass, OA, anxiety, chronic pain, DDD    PT Comments    Pt with limited activity tolerance due to pain this Am. Able to perform gait and stairs with HEP unable to perform flexion. Educated for precautions, increase in function and HEP. Will continue to follow.   Follow Up Recommendations  Home health PT     Equipment Recommendations       Recommendations for Other Services       Precautions / Restrictions Precautions Precautions: Knee Restrictions LLE Weight Bearing: Weight bearing as tolerated    Mobility  Bed Mobility Overal bed mobility: Needs Assistance Bed Mobility: Supine to Sit     Supine to sit: Min assist     General bed mobility comments: min assist of LLE today due to pain  Transfers       Sit to Stand: Supervision         General transfer comment: cues for hand placement   Ambulation/Gait Ambulation/Gait assistance: Min guard Ambulation Distance (Feet): 150 Feet Assistive device: Rolling walker (2 wheeled) Gait Pattern/deviations: Step-through pattern;Decreased stride length;Decreased dorsiflexion - left   Gait velocity interpretation: Below normal speed for age/gender General Gait Details: cues for posture, sequence, safety and increased dorsiflexion LLE   Stairs Stairs: Yes Stairs assistance: Supervision Stair Management: Backwards;With walker Number of Stairs: 1 General stair comments: cues for sequence with pt able to return demonstrate  Wheelchair Mobility    Modified Rankin (Stroke Patients Only)       Balance                                    Cognition Arousal/Alertness: Awake/alert Behavior During Therapy: WFL for tasks assessed/performed Overall Cognitive Status: Within Functional Limits for tasks assessed                       Exercises Total Joint Exercises Quad Sets: AROM;Left;10 reps;Supine Hip ABduction/ADduction: AAROM;Left;10 reps;Supine    General Comments        Pertinent Vitals/Pain Pain Score: 7  Pain Location: left knee Pain Descriptors / Indicators: Aching Pain Intervention(s): Limited activity within patient's tolerance;Monitored during session;Repositioned;RN gave pain meds during session    Home Living                      Prior Function            PT Goals (current goals can now be found in the care plan section) Progress towards PT goals: Progressing toward goals    Frequency       PT Plan Current plan remains appropriate    Co-evaluation             End of Session Equipment Utilized During Treatment: Gait belt Activity Tolerance: Patient tolerated treatment well Patient left: in chair;with call bell/phone within reach     Time: [id number] PT Time Calculation (min) (ACUTE ONLY): 28 min  Charges:  $Gait Training: 8-22 mins $Therapeutic Exercise: 8-22 mins                    G Codes:      Melford Aase Aug 27, 2015, 9:31 AM Elwyn Reach, [place]

## 2015-07-28 NOTE — Progress Notes (Signed)
Occupational Therapy Evaluation Patient Details Name: [name] MRN: [id number] DOB: 1961/01/01 Today's Date: 07/28/2015    History of Present Illness 55 yo admitted for L TKA. PMHx: bipolar, lung mass, OA, anxiety, chronic pain, DDD   Clinical Impression   Completed education regarding compensatory techniques for ADL and functional mobility with use of DME and AE. Pt safe to D/C home. OT signing off.     Follow Up Recommendations  No OT follow up;Supervision/Assistance - 24 hour    Equipment Recommendations  None recommended by OT    Recommendations for Other Services       Precautions / Restrictions Precautions Precautions: Knee Restrictions LLE Weight Bearing: Weight bearing as tolerated      Mobility Bed Mobility Overal bed mobility: Modified Independent                Transfers Overall transfer level: Needs assistance     Sit to Stand: Supervision         General transfer comment: cues for hand placement     Balance                                            ADL Overall ADL's : Needs assistance/impaired                                     Functional mobility during ADLs: Supervision/safety General ADL Comments: educated pt on compensatory techniques for ADL. Pt states he has a Secondary school teacher at home. Educated pt on use of reacher to assist with LB dressing. Educated pt on safe walk in shower techniques. Pt verbalized understanding.     Vision     Perception     Praxis      Pertinent Vitals/Pain Pain Assessment: 0-10 Pain Score: 6  Pain Location: L knee Pain Descriptors / Indicators: Throbbing;Squeezing;Jabbing Pain Intervention(s): Limited activity within patient's tolerance;Ice applied     Hand Dominance     Extremity/Trunk Assessment Upper Extremity Assessment Upper Extremity Assessment: Overall WFL for tasks assessed   Lower Extremity Assessment Lower Extremity Assessment: Defer to PT  evaluation   Cervical / Trunk Assessment Cervical / Trunk Assessment: Normal   Communication Communication Communication: No difficulties   Cognition Arousal/Alertness: Awake/alert Behavior During Therapy: WFL for tasks assessed/performed Overall Cognitive Status: Within Functional Limits for tasks assessed                     General Comments       Exercises Exercises: Total Joint     Shoulder Instructions      Home Living Family/patient expects to be discharged to:: Private residence Living Arrangements: Spouse/significant other Available Help at Discharge: Family;Available 24 hours/day Type of Home: House Home Access: Stairs to enter [employer] of Steps: 1   Home Layout: One level     Bathroom Shower/Tub: Occupational psychologist: Standard     Home Equipment: Environmental consultant - 2 wheels;Bedside commode;Cane - single point;Shower seat          Prior Functioning/Environment Level of Independence: Independent             OT Diagnosis: Generalized weakness;Acute pain   OT Problem List: Decreased strength;Decreased range of motion;Decreased knowledge of use of DME or AE;Pain   OT Treatment/Interventions:  OT Goals(Current goals can be found in the care plan section) Acute Rehab OT Goals Patient Stated Goal: to not be in pain OT Goal Formulation: All assessment and education complete, DC therapy  OT Frequency:     Barriers to D/C:            Co-evaluation              End of Session CPM Left Knee CPM Left Knee: Off Nurse Communication: Mobility status  Activity Tolerance: Patient limited by pain Patient left: in bed;with call bell/phone within reach   Time: 1335-1346 OT Time Calculation (min): 11 min Charges:  OT General Charges $OT Visit: 1 Procedure OT Evaluation $OT Eval Low Complexity: 1 Procedure G-Codes:    [name],[name] 19-Aug-2015, 3:10 PM   [facility], OTR/L  [phone] 2015-08-19

## 2015-07-28 NOTE — Progress Notes (Signed)
Patient states that his pain is a 10/10. Upon entering the room to give 15mg  of oxycodone, patient states "My pain was a 6/10 30 minutes ago when I called". This nurse Probation officer stated "I just received a call about ten minutes ago that you were in pain". Patient states "well, he said he told you and these pills just aren't going to cut it".  This nurse Probation officer stated "Ok, I will bring you some dilaudid". I went to the nurse station and asked to see the patient call log. Room 29 called for pain medicine at 1444. Oxycodone was given at 1453. This Economist administered 1mg  dilaudid via IV. Patient states "you cut back on that didn't you?'  Nurse writer states "no, that was 1mg  of dilaudid".  Patient states "Is that what it has been? I didn't feel it go in". This nurse writed replied "yes".  Patient currently resting in bed. Ice pack on knee. Nursing will continue to monitor.

## 2015-07-28 NOTE — Progress Notes (Signed)
Orthopedic Tech Progress Note Patient Details:  [name] 01/22/1961 [id number]  CPM Left Knee CPM Left Knee: Off Left Knee Flexion (Degrees): 40 Left Knee Extension (Degrees): 10 Additional Comments: trapeze bar patient helper   [name] 07/28/2015, 3:00 PM

## 2015-07-28 NOTE — Progress Notes (Signed)
Received call from Unit RN and pt decided on [facility]. [place] Go for [facility] for scheduled dc today, will arrange [place] with agency on 07/29/2015. Jonnie Finner RN CCM Case Mgmt phone [phone]

## 2015-07-29 NOTE — Care Management Note (Signed)
Case Management Note  Patient Details  Name: [name] MRN: [id number] Date of Birth: 27-May-1961  Subjective/Objective:       L TKA             Action/Plan: NCM spoke to [place] rep and they did accept referral for [facility]. Please see previous NCM notes.   Expected Discharge Date:  07/28/2015               Expected Discharge Plan:  [place]  In-House Referral:     Discharge planning Services  CM Consult  Post Acute Care Choice:  Home Health Choice offered to:  Patient, Spouse  DME Arranged:  N/A DME Agency:  NA  HH Arranged:  PT HH Agency:  [place]  Status of Service:  Completed, signed off  Medicare Important Message Given:    Date Medicare IM Given:    Medicare IM give by:    Date Additional Medicare IM Given:    Additional Medicare Important Message give by:     If discussed at [place] of Stay Meetings, dates discussed:    Additional Comments:  Erenest Rasher, RN 07/29/2015, 1:32 PM

## 2015-07-30 ENCOUNTER — Encounter (HOSPITAL_COMMUNITY): Payer: Self-pay | Admitting: Orthopedic Surgery

## 2015-08-03 NOTE — Discharge Summary (Signed)
Patient ID: [name] MRN: [id number] DOB/AGE: 1961-01-07 55 y.o.  Admit date: 07/27/2015 Discharge date: 08/03/2015  Admission Diagnoses:  Principal Problem:   Primary osteoarthritis of left knee Active Problems:   Degenerative arthritis of left knee   Discharge Diagnoses:  Same  Past Medical History  Diagnosis Date  . BPH (benign prostatic hyperplasia)   . Depression   . Hidradenitis   . Morbid obesity ([place])   . OSA (obstructive sleep apnea)   . Anxiety   . Kidney stones   . GERD (gastroesophageal reflux disease)   . Arthritis   . Pneumonia     put on Prednisone every other day  . Muscle spasm of both lower legs     takes Baclofen daily as needed;notices in hands as well  . Joint pain   . Back pain     DDD  . Urinary frequency   . Urinary urgency   . Anemia     takes Ferrous Sulfate every other day  . Bipolar affective ([place])     takes Seroquel nightly    Surgeries: Procedure(s): TOTAL KNEE ARTHROPLASTY on 07/27/2015   Consultants:    Discharged Condition: Improved  Hospital Course: [name] is an 55 y.o. male who was admitted 07/27/2015 for operative treatment ofPrimary osteoarthritis of left knee. Patient has severe unremitting pain that affects sleep, daily activities, and work/hobbies. After pre-op clearance the patient was taken to the operating room on 07/27/2015 and underwent  Procedure(s): TOTAL KNEE ARTHROPLASTY.    Patient was given perioperative antibiotics:  Anti-infectives    Start     Dose/Rate Route Frequency Ordered Stop   07/27/15 0600  ceFAZolin (ANCEF) IVPB 2 g/50 mL premix     2 g 100 mL/hr over 30 Minutes Intravenous To [facility] 07/26/15 1048 07/27/15 [id number]       Patient was given sequential compression devices, early ambulation, and chemoprophylaxis to prevent DVT.  Patient benefited maximally from hospital stay and there were no complications.    Recent vital signs: No data found.    Recent laboratory studies: No  results for input(s): WBC, HGB, HCT, PLT, NA, K, CL, CO2, BUN, CREATININE, GLUCOSE, INR, CALCIUM in the last 72 hours.  Invalid input(s): PT, 2   Discharge Medications:     Medication List    STOP taking these medications        baclofen 10 MG tablet  Commonly known as:  LIORESAL     diclofenac 50 MG EC tablet  Commonly known as:  VOLTAREN      TAKE these medications        ALPRAZolam 1 MG tablet  Commonly known as:  XANAX  Take 1 mg by mouth 3 (three) times daily as needed for anxiety.     aspirin EC 325 MG tablet  Take 1 tablet (325 mg total) by mouth 2 (two) times daily.     calcium citrate 950 MG tablet  Commonly known as:  CALCITRATE - dosed in mg elemental calcium  Take 200 mg of elemental calcium by mouth daily.     ferrous sulfate 325 (65 FE) MG tablet  Take 325 mg by mouth every other day.     Fish Oil 1000 MG Caps  Take 2,000 mg by mouth daily.     HYDROcodone-acetaminophen 7.5-325 MG tablet  Commonly known as:  NORCO  Take 1 tablet by mouth every 6 (six) hours as needed for severe pain.     HYSINGLA ER 30 MG  T24a  Generic drug:  HYDROcodone Bitartrate ER  Take 1 tablet by mouth daily.     methocarbamol 500 MG tablet  Commonly known as:  ROBAXIN  Take 1 tablet (500 mg total) by mouth 2 (two) times daily with a meal.     multivitamin tablet  Take 1 tablet by mouth daily.     oxyCODONE-acetaminophen 5-325 MG tablet  Commonly known as:  ROXICET  Take 1 tablet by mouth every 4 (four) hours as needed.     predniSONE 20 MG tablet  Commonly known as:  DELTASONE  Take 10 mg by mouth every other day.     QUEtiapine 300 MG tablet  Commonly known as:  SEROQUEL  Take 300-600 mg by mouth at bedtime.        Diagnostic Studies: Dg Chest 2 View  07/06/2015  CLINICAL DATA:  Right lung mass. Negative bronchoscopic cytology and biopsy. EXAM: CHEST - 2 VIEW COMPARISON:  Previous chest x-ray on 06/01/2015 as well CT on 06/05/2015 and PET scan on 06/11/2015.  FINDINGS: The right upper lobe mass-like opacity has essentially resolved with minimal residual scarring remaining. No new mass lesions identified. No evidence of edema, pneumothorax or pleural fluid. Stable chronic lung disease. The heart size and mediastinal contours are normal. IMPRESSION: Resolution of mass-like opacity in the right lung with some minimal residual scarring remaining. Electronically Signed   By: Aletta Edouard M.D.   On: 07/06/2015 10:04    Disposition: 06-Home-Health Care Svc        Follow-up Information    Follow up with Kerin Salen, MD In 2 weeks.   Specialty:  Orthopedic Surgery   Contact information:   [place] [address] [phone]       Follow [facility] with [facility].   Why:  Home Health Physical Therapy   Contact information:   [place] [address] [phone]        Signed: Hardin Negus, Mubarak Bevens R 08/03/2015, 1:06 PM

## 2015-08-07 ENCOUNTER — Telehealth: Payer: Self-pay | Admitting: Pulmonary Disease

## 2015-08-07 NOTE — Telephone Encounter (Signed)
Forms have been completed and sent to [facility] in Medical records. Nothing further needed.

## 2015-08-07 NOTE — Telephone Encounter (Signed)
Called and spoke with the pt. Pt is calling to check on status of Short term disability forms. I discussed this with SN. SN requested the call be transferred to his office. Call was transferred. Nothing further needed. Will route message to [facility] for follow up.

## 2015-09-04 ENCOUNTER — Other Ambulatory Visit: Payer: Self-pay | Admitting: Orthopedic Surgery

## 2015-09-06 NOTE — Pre-Procedure Instructions (Addendum)
    DERRYN APA  09/06/2015      [id number] - [place], [place] - [address] [place] [address] Phone: [phone] Fax: [phone]  [facility] [address] [place] [id number] [place] [address] Phone: [phone] Fax: [phone]    Your procedure is scheduled on Wednesday, April 19.   Report to [facility] Admitting at 12:45 P.M.                 Your surgery or procedure is scheduled for  2:45 PM   Call this number if you have problems the morning of surgery:[phone]                 For any other questions, please call [phone], Monday - Friday 8 AM - 4 PM.   Remember:  Do not eat food or drink liquids after midnight Tuesday, April 18.   Take these medicines the morning of surgery with A SIP OF WATER : May take Acetaminophen if needed.                 Wednesday, April 12, STOP taking Vitamins, Diclofenac, Fish Oil.  Do not take any Aspirin, Aspirin Products, Ibuprofen (Advil), Naproxen (Aleve).     Do not wear jewelry,  Do not wear lotions, powders, or perfumes.     Men may shave face and neck.    Do not bring valuables to the hospital   [facility] is not responsible for any belongings or valuables.  Contacts, dentures or bridgework may not be worn into surgery.  Leave your suitcase in the car.  After surgery it may be brought to your room.  For patients admitted to the hospital, discharge time will be determined by your treatment team.  Patients discharged the day of surgery will not be allowed to drive home.    Special instructions:  Review  [place] - Preparing For Surgery.  Please read over the following fact sheets that you were given. Pain Booklet, Coughing and Deep Breathing, Blood Transfusion Information, MRSA Information and Surgical Site Infection Prevention

## 2015-09-07 ENCOUNTER — Encounter (HOSPITAL_COMMUNITY)
Admission: RE | Admit: 2015-09-07 | Discharge: 2015-09-07 | Disposition: A | Discharge status: Home / Self Care | Payer: BLUE CROSS/BLUE SHIELD | Source: Ambulatory Visit | Attending: Orthopedic Surgery | Admitting: Orthopedic Surgery

## 2015-09-07 ENCOUNTER — Encounter (HOSPITAL_COMMUNITY): Payer: Self-pay

## 2015-09-07 DIAGNOSIS — Z01812 Encounter for preprocedural laboratory examination: Secondary | ICD-10-CM | POA: Insufficient documentation

## 2015-09-07 DIAGNOSIS — M1711 Unilateral primary osteoarthritis, right knee: Secondary | ICD-10-CM | POA: Diagnosis not present

## 2015-09-07 DIAGNOSIS — Z0183 Encounter for blood typing: Secondary | ICD-10-CM | POA: Diagnosis not present

## 2015-09-07 LAB — BASIC METABOLIC PANEL
ANION GAP: 11 (ref 5–15)
BUN: 13 mg/dL (ref 6–20)
CALCIUM: 9.4 mg/dL (ref 8.9–10.3)
CO2: 23 mmol/L (ref 22–32)
CREATININE: 0.84 mg/dL (ref 0.61–1.24)
Chloride: 108 mmol/L (ref 101–111)
Glucose, Bld: 95 mg/dL (ref 65–99)
Potassium: 3.8 mmol/L (ref 3.5–5.1)
Sodium: 142 mmol/L (ref 135–145)

## 2015-09-07 LAB — CBC WITH DIFFERENTIAL/PLATELET
BASOS ABS: 0 10*3/uL (ref 0.0–0.1)
BASOS PCT: 0 %
EOS ABS: 0.2 10*3/uL (ref 0.0–0.7)
Eosinophils Relative: 3 %
HEMATOCRIT: 40.1 % (ref 39.0–52.0)
HEMOGLOBIN: 12.4 g/dL — AB (ref 13.0–17.0)
Lymphocytes Relative: 22 %
Lymphs Abs: 1.2 10*3/uL (ref 0.7–4.0)
MCH: 27 pg (ref 26.0–34.0)
MCHC: 30.9 g/dL (ref 30.0–36.0)
MCV: 87.4 fL (ref 78.0–100.0)
MONO ABS: 0.3 10*3/uL (ref 0.1–1.0)
MONOS PCT: 5 %
NEUTROS ABS: 4.1 10*3/uL (ref 1.7–7.7)
NEUTROS PCT: 70 %
Platelets: 327 10*3/uL (ref 150–400)
RBC: 4.59 MIL/uL (ref 4.22–5.81)
RDW: 15.5 % (ref 11.5–15.5)
WBC: 5.8 10*3/uL (ref 4.0–10.5)

## 2015-09-07 LAB — URINALYSIS, ROUTINE W REFLEX MICROSCOPIC
Bilirubin Urine: NEGATIVE
GLUCOSE, UA: 250 mg/dL — AB
HGB URINE DIPSTICK: NEGATIVE
Ketones, ur: NEGATIVE mg/dL
LEUKOCYTES UA: NEGATIVE
Nitrite: NEGATIVE
PH: 5.5 (ref 5.0–8.0)
Protein, ur: NEGATIVE mg/dL
SPECIFIC GRAVITY, URINE: 1.018 (ref 1.005–1.030)

## 2015-09-07 LAB — TYPE AND SCREEN
ABO/RH(D): B POS
Antibody Screen: NEGATIVE

## 2015-09-07 LAB — SURGICAL PCR SCREEN
MRSA, PCR: NEGATIVE
STAPHYLOCOCCUS AUREUS: NEGATIVE

## 2015-09-07 LAB — PROTIME-INR
INR: 1.04 (ref 0.00–1.49)
PROTHROMBIN TIME: 13.8 s (ref 11.6–15.2)

## 2015-09-07 LAB — APTT: aPTT: 28 seconds (ref 24–37)

## 2015-09-18 MED ORDER — DEXTROSE 5 % IV SOLN
3.0000 g | INTRAVENOUS | Status: DC
  Filled 2015-09-18: qty 3000

## 2015-09-18 MED ORDER — DEXTROSE-NACL 5-0.45 % IV SOLN: INTRAVENOUS | Status: DC

## 2015-09-18 MED ORDER — CHLORHEXIDINE GLUCONATE 4 % EX LIQD: 60.0000 mL | Freq: Once | CUTANEOUS | Status: DC

## 2015-09-18 NOTE — H&P (Signed)
TOTAL KNEE ADMISSION H&P  Patient is being admitted for right total knee arthroplasty.  Subjective:  Chief Complaint:right knee pain.  HPI: [name], 55 y.o. male, has a history of pain and functional disability in the right knee due to arthritis and has failed non-surgical conservative treatments for greater than 12 weeks to includeNSAID's and/or analgesics, corticosteriod injections, flexibility and strengthening excercises, use of assistive devices, weight reduction as appropriate and activity modification.  Onset of symptoms was gradual, starting 2 years ago with gradually worsening course since that time. The patient noted no past surgery on the right knee(s).  Patient currently rates pain in the right knee(s) at 10 out of 10 with activity. Patient has night pain, worsening of pain with activity and weight bearing, pain that interferes with activities of daily living, pain with passive range of motion, crepitus and joint swelling.  Patient has evidence of subchondral sclerosis, periarticular osteophytes and joint space narrowing by imaging studies.   There is no active infection.  Patient Active Problem List   Diagnosis Date Noted  . Degenerative arthritis of left knee 07/27/2015  . Primary osteoarthritis of left knee 07/21/2015  . Abnormal findings on diagnostic imaging of lung 07/06/2015  . Mediastinal lymphadenopathy   . Lung mass 06/08/2015  . Ex-cigarette smoker 06/08/2015  . Osteoarthritis of left knee 06/08/2015  . Chronic pain syndrome 06/08/2015  . Anxiety state 06/08/2015  . Sleep apnea with use of continuous positive airway pressure (CPAP) 11/01/2012  . 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 Tailor's bunion BILATERAL 09/29/2012  . 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 Hidradenitis of left thigh 05/09/2011  . BPH (benign prostatic hyperplasia) 11/21/2010  . Arthritis/joint pain 11/21/2010   Past Medical History  Diagnosis Date  . BPH (benign  prostatic hyperplasia)   . Depression   . Hidradenitis   . Morbid obesity ([place])   . Anxiety   . Kidney stones   . GERD (gastroesophageal reflux disease)   . Arthritis   . Pneumonia     put on Prednisone every other day  . Muscle spasm of both lower legs     takes Baclofen daily as needed;notices in hands as well  . Joint pain   . Back pain     DDD  . Urinary frequency   . Urinary urgency   . Anemia     takes Ferrous Sulfate every other day  . Bipolar affective ([place])     takes Seroquel nightly  . OSA (obstructive sleep apnea)     does not use CPAP    Past Surgical History  Procedure Laterality Date  . Gastric bypass  2003  . Knee arthroscopy Bilateral 1997  . Umbilical hidradenitis excision  02/27/11  . Bypass graft  1976    PLATE AND BONE   . Tonsillectomy    . Foot surgery  2014    BIL  . Endobronchial ultrasound N/A 06/15/2015    Procedure: ENDOBRONCHIAL ULTRASOUND;  Surgeon: Juanito Doom, MD;  Location: [place];  Service: Cardiopulmonary;  Laterality: N/A;  . Video bronchoscopy N/A 06/15/2015    Procedure: VIDEO BRONCHOSCOPY WITHOUT FLUORO;  Surgeon: Juanito Doom, MD;  Location: [place];  Service: Cardiopulmonary;  Laterality: N/A;  . Nasal window    . Colonoscopy    . Esophagogastroduodenoscopy    . Hernia repair  2004  . Total knee arthroplasty Left 07/27/2015    Procedure: TOTAL KNEE ARTHROPLASTY;  Surgeon: Frederik Pear, MD;  Location: [place];  Service: Orthopedics;  Laterality: Left;  . Joint replacement      No prescriptions prior to admission   Allergies  Allergen Reactions  . Sulfa Antibiotics Itching and Rash    More severe reaction 3 years ago, caused pain.  Had taken it prior and not as bad.    Social History  Substance Use Topics  . Smoking status: Former Smoker -- 1.00 packs/day for 20 years    Types: Cigarettes    Quit date: 06/02/2010  . Smokeless tobacco: Never Used  . Alcohol Use: 0.0 oz/week    0 Standard drinks or equivalent per week      Comment: rarely    Family History  Problem Relation Age of Onset  . Other Mother     Alzheimers  . Alzheimer's disease Mother   . Cancer Father     Prostate  . Heart disease Father      Review of Systems  Constitutional: Negative.   HENT: Negative.   Eyes: Negative.   Respiratory: Negative.   Cardiovascular: Negative.   Gastrointestinal: Negative.   Genitourinary: Negative.   Musculoskeletal: Positive for joint pain.  Skin: Negative.   Neurological: Negative.   Endo/Heme/Allergies: Negative.   Psychiatric/Behavioral: Positive for depression. The patient is nervous/anxious.     Objective:  Physical Exam  Constitutional: He is oriented to person, place, and time. He appears well-developed and well-nourished.  HENT:  Head: Normocephalic and atraumatic.  Eyes: Pupils are equal, round, and reactive to light.  Neck: Normal range of motion. Neck supple.  Cardiovascular: Intact distal pulses.   Respiratory: Effort normal.  Musculoskeletal: He exhibits tenderness.  Patient's left knee shows appropriate healing.  The incision shows no signs of drainage.  No erythema or warmth.  No effusion.  He has a range from 0-125.  His calves are soft and nontender.  TracE edema bilaterally in his lower legs.  He is neurovascularly intact distally.  Patient's right knee has a range from 5 to 125.  Continued tenderness over the medial joint line.  No instability.  Neurological: He is alert and oriented to person, place, and time.  Skin: Skin is warm and dry.  Psychiatric: He has a normal mood and affect. His behavior is normal. Judgment and thought content normal.    Vital signs in last 24 hours:    Labs:   Estimated body mass index is 31.82 kg/(m^2) as calculated from the following:   Height as of 07/17/15: 6\' 3"  (1.905 m).   Weight as of 07/17/15: 115.486 kg (254 lb 9.6 oz).   Imaging Review Plain radiographs demonstrate standing AP and Rosenberg x-rays of both knees continue to  show moderate to severe arthritis that is symmetric with peripheral osteophytes and medial compartment sclerosis.   Assessment/Plan:  End stage arthritis, right knee   The patient history, physical examination, clinical judgment of the provider and imaging studies are consistent with end stage degenerative joint disease of the right knee(s) and total knee arthroplasty is deemed medically necessary. The treatment options including medical management, injection therapy arthroscopy and arthroplasty were discussed at length. The risks and benefits of total knee arthroplasty were presented and reviewed. The risks due to aseptic loosening, infection, stiffness, patella tracking problems, thromboembolic complications and other imponderables were discussed. The patient acknowledged the explanation, agreed to proceed with the plan and consent was signed. Patient is being admitted for inpatient treatment for surgery, pain control, PT, OT, prophylactic antibiotics, VTE prophylaxis, progressive ambulation and ADL's  and discharge planning. The patient is planning to be discharged home with home health services

## 2015-09-19 ENCOUNTER — Inpatient Hospital Stay (HOSPITAL_COMMUNITY)
Admission: RE | Admit: 2015-09-19 | Discharge: 2015-09-20 | DRG: 470 | Disposition: A | Discharge status: Home Health | Payer: BLUE CROSS/BLUE SHIELD | Source: Ambulatory Visit | Attending: Orthopedic Surgery | Admitting: Orthopedic Surgery

## 2015-09-19 ENCOUNTER — Encounter (HOSPITAL_COMMUNITY)
Admission: RE | Disposition: A | Discharge status: Home Health | Payer: Self-pay | Source: Ambulatory Visit | Attending: Orthopedic Surgery

## 2015-09-19 ENCOUNTER — Inpatient Hospital Stay (HOSPITAL_COMMUNITY): Payer: BLUE CROSS/BLUE SHIELD | Admitting: Anesthesiology

## 2015-09-19 ENCOUNTER — Encounter (HOSPITAL_COMMUNITY): Payer: Self-pay | Admitting: *Deleted

## 2015-09-19 DIAGNOSIS — Z96652 Presence of left artificial knee joint: Secondary | ICD-10-CM | POA: Diagnosis present

## 2015-09-19 DIAGNOSIS — G4733 Obstructive sleep apnea (adult) (pediatric): Secondary | ICD-10-CM | POA: Diagnosis present

## 2015-09-19 DIAGNOSIS — M1711 Unilateral primary osteoarthritis, right knee: Secondary | ICD-10-CM | POA: Diagnosis present

## 2015-09-19 DIAGNOSIS — G894 Chronic pain syndrome: Secondary | ICD-10-CM | POA: Diagnosis present

## 2015-09-19 DIAGNOSIS — D62 Acute posthemorrhagic anemia: Secondary | ICD-10-CM | POA: Diagnosis not present

## 2015-09-19 DIAGNOSIS — Z87891 Personal history of nicotine dependence: Secondary | ICD-10-CM

## 2015-09-19 DIAGNOSIS — Z9884 Bariatric surgery status: Secondary | ICD-10-CM

## 2015-09-19 DIAGNOSIS — Z8042 Family history of malignant neoplasm of prostate: Secondary | ICD-10-CM

## 2015-09-19 DIAGNOSIS — F319 Bipolar disorder, unspecified: Secondary | ICD-10-CM | POA: Diagnosis present

## 2015-09-19 HISTORY — DX: Iron deficiency anemia, unspecified: D50.9

## 2015-09-19 HISTORY — PX: TOTAL KNEE ARTHROPLASTY: SHX125

## 2015-09-19 HISTORY — DX: Other intervertebral disc degeneration, lumbar region without mention of lumbar back pain or lower extremity pain: M51.369

## 2015-09-19 HISTORY — DX: Other intervertebral disc degeneration, lumbar region: M51.36

## 2015-09-19 SURGERY — ARTHROPLASTY, KNEE, TOTAL
Anesthesia: Monitor Anesthesia Care | Site: Knee | Laterality: Right

## 2015-09-19 MED ORDER — HYDROMORPHONE HCL 1 MG/ML IJ SOLN
0.2500 mg | INTRAMUSCULAR | Status: DC | PRN
  Administered 2015-09-19 (×2): 0.5 mg via INTRAVENOUS

## 2015-09-19 MED ORDER — ACETAMINOPHEN 325 MG PO TABS
650.0000 mg | ORAL_TABLET | Freq: Four times a day (QID) | ORAL | Status: DC | PRN
  Administered 2015-09-19 – 2015-09-20 (×4): 650 mg via ORAL
  Filled 2015-09-19 (×4): qty 2

## 2015-09-19 MED ORDER — BUPIVACAINE HCL (PF) 0.5 % IJ SOLN
INTRAMUSCULAR | Status: DC | PRN
  Administered 2015-09-19: 3 mL via INTRATHECAL

## 2015-09-19 MED ORDER — AMOXICILLIN-POT CLAVULANATE 875-125 MG PO TABS
1.0000 | ORAL_TABLET | Freq: Two times a day (BID) | ORAL | Status: DC
  Administered 2015-09-19 – 2015-09-20 (×2): 1 via ORAL
  Filled 2015-09-19 (×2): qty 1

## 2015-09-19 MED ORDER — ACETAMINOPHEN 650 MG RE SUPP: 650.0000 mg | Freq: Four times a day (QID) | RECTAL | Status: DC | PRN

## 2015-09-19 MED ORDER — ALPRAZOLAM 0.5 MG PO TABS
1.0000 mg | ORAL_TABLET | Freq: Three times a day (TID) | ORAL | Status: DC | PRN
  Administered 2015-09-19 – 2015-09-20 (×3): 1 mg via ORAL
  Filled 2015-09-19 (×2): qty 2

## 2015-09-19 MED ORDER — ASPIRIN EC 325 MG PO TBEC
325.0000 mg | DELAYED_RELEASE_TABLET | Freq: Every day | ORAL | Status: DC
Start: 2015-09-20 — End: 2015-09-20
  Administered 2015-09-20: 325 mg via ORAL
  Filled 2015-09-19: qty 1

## 2015-09-19 MED ORDER — METHOCARBAMOL 500 MG PO TABS
500.0000 mg | ORAL_TABLET | Freq: Four times a day (QID) | ORAL | Status: DC | PRN
  Administered 2015-09-19 – 2015-09-20 (×5): 500 mg via ORAL
  Filled 2015-09-19 (×5): qty 1

## 2015-09-19 MED ORDER — BUPIVACAINE HCL 0.5 % IJ SOLN
INTRAMUSCULAR | Status: DC | PRN
  Administered 2015-09-19: 20 mL

## 2015-09-19 MED ORDER — LIDOCAINE HCL (CARDIAC) 20 MG/ML IV SOLN
INTRAVENOUS | Status: AC
  Filled 2015-09-19: qty 5

## 2015-09-19 MED ORDER — PHENOL 1.4 % MT LIQD: 1.0000 | OROMUCOSAL | Status: DC | PRN

## 2015-09-19 MED ORDER — BUPIVACAINE LIPOSOME 1.3 % IJ SUSP
20.0000 mL | Freq: Once | INTRAMUSCULAR | Status: AC
  Administered 2015-09-19: 20 mL
  Filled 2015-09-19: qty 20

## 2015-09-19 MED ORDER — MIDAZOLAM HCL 5 MG/5ML IJ SOLN
INTRAMUSCULAR | Status: DC | PRN
  Administered 2015-09-19: 2 mg via INTRAVENOUS

## 2015-09-19 MED ORDER — MIDAZOLAM HCL 2 MG/2ML IJ SOLN
INTRAMUSCULAR | Status: AC
  Filled 2015-09-19: qty 2

## 2015-09-19 MED ORDER — METHOCARBAMOL 500 MG PO TABS
ORAL_TABLET | ORAL | Status: AC
  Filled 2015-09-19: qty 1

## 2015-09-19 MED ORDER — ALPRAZOLAM 0.25 MG PO TABS
ORAL_TABLET | ORAL | Status: AC
  Filled 2015-09-19: qty 4

## 2015-09-19 MED ORDER — OXYCODONE HCL 5 MG PO TABS
5.0000 mg | ORAL_TABLET | Freq: Once | ORAL | Status: AC | PRN
  Administered 2015-09-19: 5 mg via ORAL

## 2015-09-19 MED ORDER — SENNOSIDES-DOCUSATE SODIUM 8.6-50 MG PO TABS: 1.0000 | ORAL_TABLET | Freq: Every evening | ORAL | Status: DC | PRN

## 2015-09-19 MED ORDER — ASPIRIN EC 325 MG PO TBEC: 325.0000 mg | DELAYED_RELEASE_TABLET | Freq: Two times a day (BID) | ORAL | Status: DC

## 2015-09-19 MED ORDER — TRANEXAMIC ACID 1000 MG/10ML IV SOLN
2000.0000 mg | Freq: Once | INTRAVENOUS | Status: AC
  Administered 2015-09-19: 2000 mg via TOPICAL
  Filled 2015-09-19: qty 20

## 2015-09-19 MED ORDER — FENTANYL CITRATE (PF) 250 MCG/5ML IJ SOLN
INTRAMUSCULAR | Status: AC
  Filled 2015-09-19: qty 5

## 2015-09-19 MED ORDER — FENTANYL CITRATE (PF) 100 MCG/2ML IJ SOLN
INTRAMUSCULAR | Status: DC | PRN
  Administered 2015-09-19 (×2): 50 ug via INTRAVENOUS
  Administered 2015-09-19: 100 ug via INTRAVENOUS
  Administered 2015-09-19: 50 ug via INTRAVENOUS

## 2015-09-19 MED ORDER — OXYCODONE-ACETAMINOPHEN 5-325 MG PO TABS: 1.0000 | ORAL_TABLET | ORAL | Status: DC | PRN

## 2015-09-19 MED ORDER — MENTHOL 3 MG MT LOZG: 1.0000 | LOZENGE | OROMUCOSAL | Status: DC | PRN

## 2015-09-19 MED ORDER — ONDANSETRON HCL 4 MG PO TABS: 4.0000 mg | ORAL_TABLET | Freq: Four times a day (QID) | ORAL | Status: DC | PRN

## 2015-09-19 MED ORDER — FLUTICASONE PROPIONATE 50 MCG/ACT NA SUSP
2.0000 | Freq: Every day | NASAL | Status: DC
  Administered 2015-09-20: 2 via NASAL
  Filled 2015-09-19: qty 16

## 2015-09-19 MED ORDER — BUPIVACAINE HCL (PF) 0.5 % IJ SOLN
INTRAMUSCULAR | Status: AC
Start: 2015-09-19 — End: 2015-09-19
  Filled 2015-09-19: qty 20

## 2015-09-19 MED ORDER — FLEET ENEMA 7-19 GM/118ML RE ENEM: 1.0000 | ENEMA | Freq: Once | RECTAL | Status: DC | PRN

## 2015-09-19 MED ORDER — KCL IN DEXTROSE-NACL 20-5-0.45 MEQ/L-%-% IV SOLN
INTRAVENOUS | Status: DC
  Administered 2015-09-20: 05:00:00 via INTRAVENOUS
  Filled 2015-09-19: qty 1000

## 2015-09-19 MED ORDER — PROPOFOL 500 MG/50ML IV EMUL
INTRAVENOUS | Status: DC | PRN
  Administered 2015-09-19: 125 ug/kg/min via INTRAVENOUS

## 2015-09-19 MED ORDER — OXYCODONE HCL 5 MG PO TABS
5.0000 mg | ORAL_TABLET | ORAL | Status: DC | PRN
  Administered 2015-09-19 – 2015-09-20 (×7): 10 mg via ORAL
  Filled 2015-09-19 (×7): qty 2

## 2015-09-19 MED ORDER — FENTANYL CITRATE (PF) 100 MCG/2ML IJ SOLN
INTRAMUSCULAR | Status: AC
Start: 2015-09-19 — End: 2015-09-20
  Filled 2015-09-19: qty 2

## 2015-09-19 MED ORDER — BACLOFEN 10 MG PO TABS
10.0000 mg | ORAL_TABLET | Freq: Three times a day (TID) | ORAL | Status: DC
  Administered 2015-09-19 – 2015-09-20 (×3): 10 mg via ORAL
  Filled 2015-09-19 (×3): qty 1

## 2015-09-19 MED ORDER — HYDROMORPHONE HCL 1 MG/ML IJ SOLN
1.0000 mg | INTRAMUSCULAR | Status: DC | PRN
  Administered 2015-09-19 – 2015-09-20 (×10): 1 mg via INTRAVENOUS
  Filled 2015-09-19 (×10): qty 1

## 2015-09-19 MED ORDER — SODIUM CHLORIDE 0.9 % IR SOLN
Status: DC | PRN
  Administered 2015-09-19: 1000 mL

## 2015-09-19 MED ORDER — LACTATED RINGERS IV SOLN
INTRAVENOUS | Status: DC
  Administered 2015-09-19 (×3): via INTRAVENOUS

## 2015-09-19 MED ORDER — HYDROMORPHONE HCL 1 MG/ML IJ SOLN
INTRAMUSCULAR | Status: AC
  Filled 2015-09-19: qty 1

## 2015-09-19 MED ORDER — CEFUROXIME SODIUM 1.5 G IJ SOLR
INTRAMUSCULAR | Status: DC | PRN
  Administered 2015-09-19: 1.5 g

## 2015-09-19 MED ORDER — ALUM & MAG HYDROXIDE-SIMETH 200-200-20 MG/5ML PO SUSP
30.0000 mL | ORAL | Status: DC | PRN
  Administered 2015-09-20: 30 mL via ORAL
  Filled 2015-09-19: qty 30

## 2015-09-19 MED ORDER — METHOCARBAMOL 1000 MG/10ML IJ SOLN
500.0000 mg | Freq: Four times a day (QID) | INTRAVENOUS | Status: DC | PRN
  Filled 2015-09-19: qty 5

## 2015-09-19 MED ORDER — DOCUSATE SODIUM 100 MG PO CAPS
100.0000 mg | ORAL_CAPSULE | Freq: Two times a day (BID) | ORAL | Status: DC
  Administered 2015-09-19 – 2015-09-20 (×2): 100 mg via ORAL
  Filled 2015-09-19 (×2): qty 1

## 2015-09-19 MED ORDER — PROPOFOL 10 MG/ML IV BOLUS
INTRAVENOUS | Status: AC
  Filled 2015-09-19: qty 20

## 2015-09-19 MED ORDER — PROPOFOL 10 MG/ML IV BOLUS
INTRAVENOUS | Status: DC | PRN
  Administered 2015-09-19: 30 mg via INTRAVENOUS

## 2015-09-19 MED ORDER — METOCLOPRAMIDE HCL 5 MG PO TABS: 5.0000 mg | ORAL_TABLET | Freq: Three times a day (TID) | ORAL | Status: DC | PRN

## 2015-09-19 MED ORDER — DIPHENHYDRAMINE HCL 12.5 MG/5ML PO ELIX: 12.5000 mg | ORAL_SOLUTION | ORAL | Status: DC | PRN

## 2015-09-19 MED ORDER — ONDANSETRON HCL 4 MG/2ML IJ SOLN: 4.0000 mg | Freq: Once | INTRAMUSCULAR | Status: DC | PRN

## 2015-09-19 MED ORDER — FENTANYL CITRATE (PF) 100 MCG/2ML IJ SOLN
100.0000 ug | Freq: Once | INTRAMUSCULAR | Status: AC
  Administered 2015-09-19: 100 ug via INTRAVENOUS
  Filled 2015-09-19: qty 2

## 2015-09-19 MED ORDER — ONDANSETRON HCL 4 MG/2ML IJ SOLN: 4.0000 mg | Freq: Four times a day (QID) | INTRAMUSCULAR | Status: DC | PRN

## 2015-09-19 MED ORDER — OXYCODONE HCL 5 MG PO TABS
ORAL_TABLET | ORAL | Status: AC
  Filled 2015-09-19: qty 1

## 2015-09-19 MED ORDER — CEFUROXIME SODIUM 1.5 G IJ SOLR
INTRAMUSCULAR | Status: AC
  Filled 2015-09-19: qty 1.5

## 2015-09-19 MED ORDER — OXYCODONE HCL 5 MG/5ML PO SOLN: 5.0000 mg | Freq: Once | ORAL | Status: AC | PRN

## 2015-09-19 MED ORDER — TRANEXAMIC ACID 1000 MG/10ML IV SOLN
1000.0000 mg | INTRAVENOUS | Status: AC
  Administered 2015-09-19: 1000 mg via INTRAVENOUS
  Filled 2015-09-19: qty 10

## 2015-09-19 MED ORDER — MIDAZOLAM HCL 2 MG/2ML IJ SOLN
2.0000 mg | Freq: Once | INTRAMUSCULAR | Status: AC
  Administered 2015-09-19: 2 mg via INTRAVENOUS
  Filled 2015-09-19: qty 2

## 2015-09-19 MED ORDER — QUETIAPINE FUMARATE 300 MG PO TABS
300.0000 mg | ORAL_TABLET | Freq: Every day | ORAL | Status: DC
  Administered 2015-09-19: 600 mg via ORAL
  Filled 2015-09-19 (×2): qty 2

## 2015-09-19 MED ORDER — METOCLOPRAMIDE HCL 5 MG/ML IJ SOLN: 5.0000 mg | Freq: Three times a day (TID) | INTRAMUSCULAR | Status: DC | PRN

## 2015-09-19 MED ORDER — BISACODYL 5 MG PO TBEC
5.0000 mg | DELAYED_RELEASE_TABLET | Freq: Every day | ORAL | Status: DC | PRN
  Filled 2015-09-19: qty 1

## 2015-09-19 SURGICAL SUPPLY — 58 items
BANDAGE ACE 6X5 VEL STRL LF (GAUZE/BANDAGES/DRESSINGS) ×2 IMPLANT
BANDAGE ESMARK 6X9 LF (GAUZE/BANDAGES/DRESSINGS) ×1 IMPLANT
BLADE AVERAGE 25MMX9MM (BLADE) ×1
BLADE AVERAGE 25X9 (BLADE) ×1 IMPLANT
BLADE SAG 18X100X1.27 (BLADE) ×3 IMPLANT
BLADE SAW SGTL 13X75X1.27 (BLADE) ×3 IMPLANT
BLADE SURG ROTATE 9660 (MISCELLANEOUS) IMPLANT
BNDG CMPR 9X6 STRL LF SNTH (GAUZE/BANDAGES/DRESSINGS) ×1
BNDG CMPR MED 10X6 ELC LF (GAUZE/BANDAGES/DRESSINGS) ×1
BNDG ELASTIC 6X10 VLCR STRL LF (GAUZE/BANDAGES/DRESSINGS) ×3 IMPLANT
BNDG ESMARK 6X9 LF (GAUZE/BANDAGES/DRESSINGS) ×3
BOWL SMART MIX CTS (DISPOSABLE) ×3 IMPLANT
CAPT KNEE TOTAL 3 ATTUNE ×2 IMPLANT
CEMENT HV SMART SET (Cement) ×6 IMPLANT
COVER SURGICAL LIGHT HANDLE (MISCELLANEOUS) ×3 IMPLANT
CUFF TOURNIQUET SINGLE 34IN LL (TOURNIQUET CUFF) ×3 IMPLANT
CUFF TOURNIQUET SINGLE 44IN (TOURNIQUET CUFF) IMPLANT
DRAPE EXTREMITY T 121X128X90 (DRAPE) ×3 IMPLANT
DRAPE U-SHAPE 47X51 STRL (DRAPES) ×3 IMPLANT
DRSG AQUACEL AG ADV 3.5X10 (GAUZE/BANDAGES/DRESSINGS) ×3 IMPLANT
DURAPREP 26ML APPLICATOR (WOUND CARE) ×6 IMPLANT
ELECT REM PT RETURN 9FT ADLT (ELECTROSURGICAL) ×3
ELECTRODE REM PT RTRN 9FT ADLT (ELECTROSURGICAL) ×1 IMPLANT
EVACUATOR 1/8 PVC DRAIN (DRAIN) IMPLANT
GLOVE BIO SURGEON STRL SZ7.5 (GLOVE) ×3 IMPLANT
GLOVE BIO SURGEON STRL SZ8.5 (GLOVE) ×3 IMPLANT
GLOVE BIOGEL PI IND STRL 8 (GLOVE) ×1 IMPLANT
GLOVE BIOGEL PI IND STRL 9 (GLOVE) ×1 IMPLANT
GLOVE BIOGEL PI INDICATOR 8 (GLOVE) ×2
GLOVE BIOGEL PI INDICATOR 9 (GLOVE) ×2
GOWN STRL REUS W/ TWL LRG LVL3 (GOWN DISPOSABLE) ×1 IMPLANT
GOWN STRL REUS W/ TWL XL LVL3 (GOWN DISPOSABLE) ×2 IMPLANT
GOWN STRL REUS W/TWL LRG LVL3 (GOWN DISPOSABLE) ×3
GOWN STRL REUS W/TWL XL LVL3 (GOWN DISPOSABLE) ×6
HANDPIECE INTERPULSE COAX TIP (DISPOSABLE) ×3
HOOD PEEL AWAY FACE SHEILD DIS (HOOD) ×6 IMPLANT
KIT BASIN OR (CUSTOM PROCEDURE TRAY) ×3 IMPLANT
KIT ROOM TURNOVER OR (KITS) ×3 IMPLANT
MANIFOLD NEPTUNE II (INSTRUMENTS) ×3 IMPLANT
NDL SPNL 18GX3.5 QUINCKE PK (NEEDLE) IMPLANT
NEEDLE SPNL 18GX3.5 QUINCKE PK (NEEDLE) IMPLANT
NS IRRIG 1000ML POUR BTL (IV SOLUTION) ×3 IMPLANT
PACK TOTAL JOINT (CUSTOM PROCEDURE TRAY) ×3 IMPLANT
PAD ARMBOARD 7.5X6 YLW CONV (MISCELLANEOUS) ×6 IMPLANT
SET HNDPC FAN SPRY TIP SCT (DISPOSABLE) ×1 IMPLANT
SUT VIC AB 0 CT1 27 (SUTURE) ×3
SUT VIC AB 0 CT1 27XBRD ANBCTR (SUTURE) ×1 IMPLANT
SUT VIC AB 1 CTX 36 (SUTURE) ×3
SUT VIC AB 1 CTX36XBRD ANBCTR (SUTURE) ×1 IMPLANT
SUT VIC AB 2-0 CT1 27 (SUTURE)
SUT VIC AB 2-0 CT1 TAPERPNT 27 (SUTURE) IMPLANT
SUT VIC AB 3-0 CT1 27 (SUTURE) ×3
SUT VIC AB 3-0 CT1 TAPERPNT 27 (SUTURE) ×1 IMPLANT
SYR 50ML LL SCALE MARK (SYRINGE) ×3 IMPLANT
TOWEL OR 17X24 6PK STRL BLUE (TOWEL DISPOSABLE) ×3 IMPLANT
TOWEL OR 17X26 10 PK STRL BLUE (TOWEL DISPOSABLE) ×3 IMPLANT
TRAY CATH 16FR W/PLASTIC CATH (SET/KITS/TRAYS/PACK) IMPLANT
WATER STERILE IRR 1000ML POUR (IV SOLUTION) ×9 IMPLANT

## 2015-09-19 NOTE — Anesthesia Preprocedure Evaluation (Signed)
Anesthesia Evaluation  Patient identified by MRN, date of birth, ID band Patient awake    Reviewed: Allergy & Precautions, NPO status , Patient's Chart, lab work & pertinent test results  Airway Mallampati: II  TM Distance: >3 FB Neck ROM: Full    Dental  (+) Teeth Intact, Dental Advisory Given   Pulmonary former smoker,    breath sounds clear to auscultation       Cardiovascular  Rhythm:Regular Rate:Normal     Neuro/Psych    GI/Hepatic   Endo/Other    Renal/GU      Musculoskeletal   Abdominal (+) + obese,   Peds  Hematology   Anesthesia Other Findings   Reproductive/Obstetrics                             Anesthesia Physical Anesthesia Plan  ASA: III  Anesthesia Plan: MAC and Spinal   Post-op Pain Management:    Induction: Intravenous  Airway Management Planned: Simple Face Mask and Natural Airway  Additional Equipment:   Intra-op Plan:   Post-operative Plan:   Informed Consent: I have reviewed the patients History and Physical, chart, labs and discussed the procedure including the risks, benefits and alternatives for the proposed anesthesia with the patient or authorized representative who has indicated his/her understanding and acceptance.   Dental advisory given  Plan Discussed with: CRNA and Anesthesiologist  Anesthesia Plan Comments:         Anesthesia Quick Evaluation

## 2015-09-19 NOTE — Anesthesia Procedure Notes (Addendum)
Anesthesia Regional Block:  Adductor canal block  Pre-Anesthetic Checklist: ,, timeout performed, Correct Patient, Correct Site, Correct Laterality, Correct Procedure, Correct Position, site marked, Risks and benefits discussed,  Surgical consent,  Pre-op evaluation,  At surgeon's request and post-op pain management  Laterality: Right  Prep: chloraprep       Needles:  Injection technique: Single-shot     Needle Length: 9cm 9 cm Needle Gauge: 21 and 21 G    Additional Needles: Adductor canal block Narrative:  Start time: 09/19/2015 1:30 PM End time: 09/19/2015 1:35 PM Injection made incrementally with aspirations every 5 mL.  Performed by: Personally   Additional Notes: 30 cc 0.5% Bupivacaine injected easily   Procedure Name: MAC Date/Time: 09/19/2015 3:15 PM Performed by: Trixie Deis A Pre-anesthesia Checklist: Patient identified, Emergency Drugs available, Suction available, Patient being monitored and Timeout performed Oxygen Delivery Method: Nasal cannula Placement Confirmation: positive ETCO2 Dental Injury: Teeth and Oropharynx as per pre-operative assessment     Spinal Patient location during procedure: OR Start time: 09/19/2015 3:14 PM End time: 09/19/2015 3:15 PM Staffing Anesthesiologist: Catalina Gravel Performed by: anesthesiologist  Preanesthetic Checklist Completed: patient identified, surgical consent, pre-op evaluation, timeout performed, IV checked, risks and benefits discussed and monitors and equipment checked Spinal Block Patient position: sitting Prep: site prepped and draped and DuraPrep Patient monitoring: continuous pulse ox and blood pressure Approach: midline Location: L3-4 Needle Needle type: Pencan  Needle gauge: 24 G Assessment Sensory level: T6 Additional Notes Functioning IV was confirmed and monitors were applied. Sterile prep and drape, including hand hygiene, mask and sterile gloves were used. The patient was positioned  and the spine was prepped. The skin was anesthetized with lidocaine.  Free flow of clear CSF was obtained prior to injecting local anesthetic into the CSF.  The spinal needle aspirated freely following injection.  The needle was carefully withdrawn.  The patient tolerated the procedure well. Consent was obtained prior to procedure with all questions answered and concerns addressed. Risks including but not limited to bleeding, infection, nerve damage, paralysis, failed block, inadequate analgesia, allergic reaction, high spinal, itching and headache were discussed and the patient wished to proceed.   [name], MD

## 2015-09-19 NOTE — Interval H&P Note (Signed)
History and Physical Interval Note:  09/19/2015 12:36 PM  [name]  has presented today for surgery, with the diagnosis of RIGHT KNEE OSTEOARTHRITIS  The various methods of treatment have been discussed with the patient and family. After consideration of risks, benefits and other options for treatment, the patient has consented to  Procedure(s): TOTAL KNEE ARTHROPLASTY (Right) as a surgical intervention .  The patient's history has been reviewed, patient examined, no change in status, stable for surgery.  I have reviewed the patient's chart and labs.  Questions were answered to the patient's satisfaction.     Kerin Salen

## 2015-09-19 NOTE — Transfer of Care (Signed)
Immediate Anesthesia Transfer of Care Note  Patient: [name]  Procedure(s) Performed: Procedure(s): TOTAL KNEE ARTHROPLASTY (Right)  Patient Location: PACU  Anesthesia Type:MAC  Level of Consciousness: awake, alert  and oriented  Airway & Oxygen Therapy: Patient Spontanous Breathing  Post-op Assessment: Report given to RN, Post -op Vital signs reviewed and stable and Patient moving all extremities  Post vital signs: Reviewed and stable  Last Vitals:  Filed Vitals:   09/19/15 1402 09/19/15 1405  BP: 132/83 135/75  Pulse: 94 93  Temp:    Resp: 16 15    Complications: No apparent anesthesia complications

## 2015-09-19 NOTE — Progress Notes (Signed)
Orthopedic Tech Progress Note Patient Details:  [name] 1960/09/05 [id number] Applied CPM to RLE.  Applied OHF with trapeze to pt.'s bed.  Left Bone Foam with pt.'s nurse. CPM Right Knee CPM Right Knee: On Right Knee Flexion (Degrees): 40 Right Knee Extension (Degrees): 10   Darrol Poke 09/19/2015, 5:59 PM

## 2015-09-19 NOTE — Discharge Instructions (Signed)

## 2015-09-19 NOTE — Op Note (Signed)
PATIENT ID:      [name]  MRN:     [id number] DOB/AGE:    1961-04-28 / 55 y.o.       OPERATIVE REPORT    DATE OF PROCEDURE:  09/19/2015       PREOPERATIVE DIAGNOSIS:   RIGHT KNEE OSTEOARTHRITIS      Estimated body mass index is 33.75 kg/(m^2) as calculated from the following:   Height as of this encounter: 6\' 3"  (1.905 m).   Weight as of this encounter: 122.471 kg (270 lb).                                                        POSTOPERATIVE DIAGNOSIS:   RIGHT KNEE OSTEOARTHRITIS                                                                      PROCEDURE:  Procedure(s): TOTAL KNEE ARTHROPLASTY Using DepuyAttune RP implants #8R Femur, #10Tibia, 6 mm Attune RP bearing, 41 Patella     SURGEON: Caymen Dubray J    ASSISTANT:   Eric K[employer]   (Present and scrubbed throughout the case, critical for assistance with exposure, retraction, instrumentation, and closure.)         ANESTHESIA: Spinal, 20cc Exparel, 20cc 0.5% Marcaine  EBL: 300  FLUID REPLACEMENT: 1500 crystalloid  TOURNIQUET TIME: 21min  Drains: None  Tranexamic Acid: 1gm iv, 2gm topical   COMPLICATIONS:  None         INDICATIONS FOR PROCEDURE: The patient has  RIGHT KNEE OSTEOARTHRITIS, Var deformities, XR shows bone on bone arthritis, lateral subluxation of tibia. Patient has failed all conservative measures including anti-inflammatory medicines, narcotics, attempts at  exercise and weight loss, cortisone injections and viscosupplementation.  Risks and benefits of surgery have been discussed, questions answered.   DESCRIPTION OF PROCEDURE: The patient identified by armband, received  IV antibiotics, in the holding area at [facility]. Patient taken to the operating room, appropriate anesthetic  monitors were attached, and Spinal anesthesia was  induced. Tourniquet  applied high to the operative thigh. Lateral post and foot positioner  applied to the table, the lower extremity was then prepped and draped  in  usual sterile fashion from the toes to the tourniquet. Time-out procedure was performed. We began the operation, with the knee flexed 120 degrees, by making the anterior midline incision starting at handbreadth above the patella going over the patella 1 cm medial to and 4 cm distal to the tibial tubercle. Small bleeders in the skin and the  subcutaneous tissue identified and cauterized. Transverse retinaculum was incised and reflected medially and a medial parapatellar arthrotomy was accomplished. the patella was everted and theprepatellar fat pad resected. The superficial medial collateral  ligament was then elevated from anterior to posterior along the proximal  flare of the tibia and anterior half of the menisci resected. The knee was hyperflexed exposing bone on bone arthritis. Peripheral and notch osteophytes as well as the cruciate ligaments were then resected. We continued to  work our way around posteriorly along the proximal  tibia, and externally  rotated the tibia subluxing it out from underneath the femur. A McHale  retractor was placed through the notch and a lateral Hohmann retractor  placed, and we then drilled through the proximal tibia in line with the  axis of the tibia followed by an intramedullary guide rod and 2-degree  posterior slope cutting guide. The tibial cutting guide, 3 degree posterior sloped, was pinned into place allowing resection of 3 mm of bone medially and 11 mm of bone laterally. Satisfied with the tibial resection, we then  entered the distal femur 2 mm anterior to the PCL origin with the  intramedullary guide rod and applied the distal femoral cutting guide  set at 9 mm, with 5 degrees of valgus. This was pinned along the  epicondylar axis. At this point, the distal femoral cut was accomplished without difficulty. We then sized for a #8R femoral component and pinned the guide in 3 degrees of external rotation. The chamfer cutting guide was pinned into place. The  anterior, posterior, and chamfer cuts were accomplished without difficulty followed by  the Attune RP box cutting guide and the box cut. We also removed posterior osteophytes from the posterior femoral condyles. At this  time, the knee was brought into full extension. We checked our  extension and flexion gaps and found them symmetric for a 5 mm bearing. Distracting in extension with a lamina spreader, the posterior horns of the menisci were removed, and Exparel, diluted to 60 cc, with 20cc NS, and 20cc 0.5% Marcaine,was injected into the capsule and synovium of the knee. The posterior patella cut was accomplished with the 9.5 mm Attune cutting guide, sized for a 80mm dome, and the fixation pegs drilled.The knee  was then once again hyperflexed exposing the proximal tibia. We sized for a # 10 tibial base plate, applied the smokestack and the conical reamer followed by the the Delta fin keel punch. We then hammered into place the Attune RP trial femoral component, drilled the lugs, inserted a  6 mm trial bearing, trial patellar button, and took the knee through range of motion from 0-130 degrees. No thumb pressure was required for patellar Tracking. At this point, the limb was wrapped with an Esmarch bandage and the tourniquet inflated to 350 mmHg. All trial components were removed, mating surfaces irrigated with pulse lavage, and dried with suction and sponges. A double batch of DePuy HV cement with 1500 mg of Zinacef was mixed and applied to all bony metallic mating surfaces except for the posterior condyles of the femur itself. In order, we  hammered into place the tibial tray and removed excess cement, the femoral component and removed excess cement. The final Attune RP bearing  was inserted, and the knee brought to full extension with compression.  The patellar button was clamped into place, and excess cement  removed. While the cement cured the wound was irrigated out with normal saline solution pulse  lavage. Ligament stability and patellar tracking were checked and found to be excellent. The parapatellar arthrotomy was closed with  running #1 Vicryl suture. The subcutaneous tissue with 0 and 2-0 undyed  Vicryl suture, and the skin with running 3-0 SQ vicryl. A dressing of Xeroform,  4 x 4, dressing sponges, Webril, and Ace wrap applied. The patient  awakened, and taken to recovery room without difficulty.   Frederik Pear J 09/19/2015, 4:44 PM

## 2015-09-19 NOTE — Progress Notes (Signed)
1840: Pt arrived from PACU to room via bed with IV intact and transfusing. Pt A&O x4; right knee incision compression dsg remains clean, dry and intact; RLE in CPM; pt due to void per report; pt oriented to the unit and room; fall/ safety precaution and prevention education completed with pt. scd's on; pt in bed with call light within reach and family at bedside. Will report off to oncoming RN. Delia Heady RN

## 2015-09-19 NOTE — Anesthesia Postprocedure Evaluation (Signed)
Anesthesia Post Note  Patient: [name]  Procedure(s) Performed: Procedure(s) (LRB): TOTAL KNEE ARTHROPLASTY (Right)  Patient location during evaluation: PACU Anesthesia Type: Spinal and Regional Level of consciousness: awake, awake and alert, oriented and patient cooperative Pain management: pain level controlled Vital Signs Assessment: post-procedure vital signs reviewed and stable Respiratory status: spontaneous breathing and respiratory function stable Cardiovascular status: blood pressure returned to baseline and stable Anesthetic complications: no    Last Vitals:  Filed Vitals:   09/19/15 1815 09/19/15 1843  BP:  132/91  Pulse: 73 68  Temp: 36.9 C 37.2 C  Resp: 19 18    Last Pain:  Filed Vitals:   09/19/15 1844  PainSc: 2                  Siah Steely EDWARD

## 2015-09-20 ENCOUNTER — Encounter (HOSPITAL_COMMUNITY): Payer: Self-pay | Admitting: Orthopedic Surgery

## 2015-09-20 LAB — CBC
HCT: 33.4 % — ABNORMAL LOW (ref 39.0–52.0)
Hemoglobin: 10.4 g/dL — ABNORMAL LOW (ref 13.0–17.0)
MCH: 26.8 pg (ref 26.0–34.0)
MCHC: 31.1 g/dL (ref 30.0–36.0)
MCV: 86.1 fL (ref 78.0–100.0)
PLATELETS: 223 10*3/uL (ref 150–400)
RBC: 3.88 MIL/uL — ABNORMAL LOW (ref 4.22–5.81)
RDW: 15.5 % (ref 11.5–15.5)
WBC: 7.6 10*3/uL (ref 4.0–10.5)

## 2015-09-20 LAB — BASIC METABOLIC PANEL
ANION GAP: 10 (ref 5–15)
BUN: 15 mg/dL (ref 6–20)
CALCIUM: 8.6 mg/dL — AB (ref 8.9–10.3)
CO2: 27 mmol/L (ref 22–32)
CREATININE: 0.83 mg/dL (ref 0.61–1.24)
Chloride: 103 mmol/L (ref 101–111)
Glucose, Bld: 125 mg/dL — ABNORMAL HIGH (ref 65–99)
Potassium: 4.1 mmol/L (ref 3.5–5.1)
SODIUM: 140 mmol/L (ref 135–145)

## 2015-09-20 LAB — GLUCOSE, CAPILLARY: GLUCOSE-CAPILLARY: 212 mg/dL — AB (ref 65–99)

## 2015-09-20 NOTE — Progress Notes (Signed)
Pt taken out of CPM per pt request. Bone foam applied per MD order. RN returned to room an hour later to find patients operative foot out of bone foam. When RN asked pt why he took the bone foam off pt stated "last time I had a my other knee done the physical therapist said to only do 10-15 min increments, and to not torture myself with this device". RN informed patient of MD's orders for pt to be in bone foam when pt not in CPM and potential consequences if pt were not to follow MD order. RN also informed patient that she would administer pain medication when medication is due in order to aid in maintaining comfort while in bone foam. Pt stated that he understood and still insisted to follow the recommendations of the physical therapist from the previous admission. Nursing will continue to monitor.

## 2015-09-20 NOTE — Progress Notes (Signed)
Discharge instructions Rxs and follow up appts explained and provided to patient and family verbalized understanding. Patient left floor via wheelchair accompanied by staff no c/o pain or shortness of breath at d/c. [facility] set up for patient.  Nabor Thomann, Tivis Ringer, RN

## 2015-09-20 NOTE — Progress Notes (Signed)
Occupational Therapy Evaluation Patient Details Name: [name] MRN: [id number] DOB: January 19, 1961 Today's Date: 09/20/2015    History of Present Illness 55 y.o. male with h/o L TKA Feb 2017, bipolar, lung mass OA, anxiety, chronic pain, DDD admitted for R TKA.    Clinical Impression   Completed all education regarding compensatory techniques for ADL and functional mobility for ADL. Pt demonstrated understanding. Pt safe to D/C home when medically stable. OT signing off.  Follow Up Recommendations  No OT follow up;Supervision - Intermittent    Equipment Recommendations  None recommended by OT    Recommendations for Other Services       Precautions / Restrictions Precautions Precautions: Knee Restrictions RLE Weight Bearing: Weight bearing as tolerated      Mobility Bed Mobility               General bed mobility comments: OOB in chair  Transfers Overall transfer level: Modified independent                    Balance                                            ADL Overall ADL's : Needs assistance/impaired                                     Functional mobility during ADLs: Rolling walker;Modified independent General ADL Comments: Educated pt on shower transfer technique and use of his reacher to assist for LB dressing. REviewed home safety and reducing risk of falls. Educated on use of zero knee for terminal knee extension. Educated on use of ice for pain and edema control.      Vision     Perception     Praxis      Pertinent Vitals/Pain Pain Assessment: 0-10 Pain Score: 5  Pain Location: R knee Pain Descriptors / Indicators: Aching;Sore Pain Intervention(s): Limited activity within patient's tolerance;Repositioned;Relaxation;Ice applied     Hand Dominance     Extremity/Trunk Assessment Upper Extremity Assessment Upper Extremity Assessment: Overall WFL for tasks assessed   Lower Extremity  Assessment Lower Extremity Assessment: RLE deficits/detail RLE Deficits / Details: s/p TKA   Cervical / Trunk Assessment Cervical / Trunk Assessment: Normal   Communication Communication Communication: No difficulties   Cognition Arousal/Alertness: Awake/alert Behavior During Therapy: WFL for tasks assessed/performed Overall Cognitive Status: Within Functional Limits for tasks assessed                     General Comments       Exercises       Shoulder Instructions      Home Living Family/patient expects to be discharged to:: Private residence Living Arrangements: Spouse/significant other Available Help at Discharge: Family;Available 24 hours/day Type of Home: House Home Access: Stairs to enter [employer] of Steps: 1   Home Layout: One level     Bathroom Shower/Tub: Occupational psychologist: Standard Bathroom Accessibility: Yes How Accessible: Accessible via walker Home Equipment: [place] - 2 wheels;Bedside commode;Cane - single point;[place], OTR/L  [phone] 4/20/2017Prior Functioning/Environment Level of Independence: Independent             OT Diagnosis: Generalized weakness;Acute pain  OT Problem List: Decreased strength;Decreased range of motion;Decreased activity tolerance;Pain;Decreased knowledge of use of DME or AE   OT Treatment/Interventions:      OT Goals(Current goals can be found in the care plan section) Acute Rehab OT Goals 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

## 2015-09-20 NOTE — Progress Notes (Signed)
Patient ID: [name], male   DOB: Mar 28, 1961, 55 y.o.   MRN: [id number] PATIENT ID: [name]  MRN: [id number]  DOB/AGE:  10-11-1960 / 55 y.o.  1 Day Post-Op Procedure(s) (LRB): TOTAL KNEE ARTHROPLASTY (Right)    PROGRESS NOTE Subjective: Patient is alert, oriented, no Nausea, no Vomiting, yes passing gas. Taking PO well. Denies SOB, Chest or Calf Pain. Using Incentive Spirometer, PAS in place. Ambulate WBAT, CPM 0-40 Patient reports pain as 3/10 .    Objective: Vital signs in last 24 hours: Filed Vitals:   09/19/15 1815 09/19/15 1843 09/19/15 2144 09/20/15 0325  BP:  132/91  125/89  Pulse: 73 68 78 91  Temp: 98.4 F (36.9 C) 98.9 F (37.2 C) 98.6 F (37 C) 97.5 F (36.4 C)  TempSrc:   Oral Oral  Resp: 19 18 18 18   Height:      Weight:      SpO2: 97% 98%  97%      Intake/Output from previous day: I/O last 3 completed shifts: In: 1500 [P.O.:200; I.V.:1300] Out: 475 [Urine:375; Blood:100]   Intake/Output this shift: Total I/O In: 187.5 [I.V.:187.5] Out: 300 [Urine:300]   LABORATORY DATA:  Recent Labs  09/20/15 0529 09/20/15 0649  WBC 7.6  --   HGB 10.4*  --   HCT 33.4*  --   PLT 223  --   NA 140  --   K 4.1  --   CL 103  --   CO2 27  --   BUN 15  --   CREATININE 0.83  --   GLUCOSE 125*  --   GLUCAP  --  212*  CALCIUM 8.6*  --     Examination: Neurologically intact ABD soft Neurovascular intact Sensation intact distally Intact pulses distally Dorsiflexion/Plantar flexion intact Incision: dressing C/D/I No cellulitis present Compartment soft}  Assessment:   1 Day Post-Op Procedure(s) (LRB): TOTAL KNEE ARTHROPLASTY (Right) ADDITIONAL DIAGNOSIS: Expected Acute Blood Loss Anemia, Sleep Apnea  Plan: PT/OT WBAT, CPM 5/hrs day until ROM 0-90 degrees, then D/C CPM DVT Prophylaxis:  SCDx72hrs, ASA 325 mg BID x 2 weeks DISCHARGE PLAN: Home, today if passes PT DISCHARGE NEEDS: HHPT, CPM, Walker and 3-in-1 comode seat     Deneene Tarver J 09/20/2015,  8:55 AM

## 2015-09-20 NOTE — Evaluation (Signed)
Physical Therapy Evaluation Patient Details Name: MATTHIEU LOFTUS MRN: [id number] DOB: 1961-03-04 Today's Date: 09/20/2015   History of Present Illness  55 y.o. male with h/o L TKA Feb 2017, bipolar, lung mass OA, anxiety, chronic pain, DDD admitted for R TKA.   Clinical Impression  Pt ambulated 300' with RW independently, performed stair training without assistance, reviewed TKA exercise program for which he demonstrated good understanding. From PT standpoint he is ready to DC home.     Follow Up Recommendations Home health PT    Equipment Recommendations  None recommended by PT    Recommendations for Other Services       Precautions / Restrictions Precautions Precautions: Knee Restrictions Weight Bearing Restrictions: Yes RLE Weight Bearing: Weight bearing as tolerated      Mobility  Bed Mobility Overal bed mobility: Modified Independent             General bed mobility comments: HOB up 30*  Transfers Overall transfer level: Modified independent Equipment used: Rolling walker (2 wheeled)             General transfer comment: good hand placement, no physical assist needed  Ambulation/Gait Ambulation/Gait assistance: Modified independent (Device/Increase time) Ambulation Distance (Feet): 300 Feet Assistive device: Rolling walker (2 wheeled) Gait Pattern/deviations: Step-through pattern   Gait velocity interpretation: Below normal speed for age/gender General Gait Details: good heel strike pattern with RLE, steady without loss of balance  Stairs Stairs: Yes Stairs assistance: Supervision Stair Management: No rails;With walker;Backwards Number of Stairs: 1 General stair comments: 1 step x 2 trials, no physical assist, supervision for safety, good sequencing  Wheelchair Mobility    Modified Rankin (Stroke Patients Only)       Balance Overall balance assessment: Modified Independent                                            Pertinent Vitals/Pain Pain Assessment: 0-10 Pain Score: 5  Pain Location: R knee Pain Descriptors / Indicators: Sore Pain Intervention(s): Premedicated before session;Monitored during session;Limited activity within patient's tolerance;Ice applied    Home Living Family/patient expects to be discharged to:: Private residence Living Arrangements: Spouse/significant other Available Help at Discharge: Family;Available 24 hours/day Type of Home: House Home Access: Stairs to enter   [employer] of Steps: 1 Home Layout: One level Home Equipment: Walker - 2 wheels;Bedside commode;Cane - single point;Shower seat      Prior Function Level of Independence: Independent               Hand Dominance        Extremity/Trunk Assessment   Upper Extremity Assessment: Overall WFL for tasks assessed           Lower Extremity Assessment: RLE deficits/detail RLE Deficits / Details: knee AAROM 5-70*, knee ext -3/5, SLR -3/5, sensation intact    Cervical / Trunk Assessment: Normal  Communication   Communication: No difficulties  Cognition Arousal/Alertness: Awake/alert Behavior During Therapy: WFL for tasks assessed/performed Overall Cognitive Status: Within Functional Limits for tasks assessed                      General Comments      Exercises Total Joint Exercises Ankle Circles/Pumps: AROM;Both;10 reps;Supine Quad Sets: AROM;Both;5 reps;Supine Short Arc Quad: AROM;Right;10 reps;Supine Hip ABduction/ADduction: AAROM;Right;10 reps;Supine Straight Leg Raises: AAROM;Right;10 reps;Supine Long Arc Quad: AAROM;Right;10 reps;Seated Knee Flexion: AROM;Right;Seated;10  reps;AAROM (with assistance into flexion with LLE) Goniometric ROM: 5-70* AAROM R knee      Assessment/Plan    PT Assessment All further PT needs can be met in the next venue of care  PT Diagnosis Acute pain   PT Problem List Decreased strength;Decreased range of motion;Decreased  activity tolerance;Pain;Decreased mobility  PT Treatment Interventions     PT Goals (Current goals can be found in the Care Plan section) Acute Rehab PT Goals 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, hospital PT Goal Formulation: All assessment and education complete, DC therapy Potential to Achieve Goals: Good    Frequency     Barriers to discharge        Co-evaluation               End of Session Equipment Utilized During Treatment: Gait belt Activity Tolerance: Patient tolerated treatment well Patient left: in chair;with call bell/phone within reach Nurse Communication: Mobility status         Time: 6840-3353 PT Time Calculation (min) (ACUTE ONLY): 31 min   Charges:   PT Evaluation $PT Eval Low Complexity: 1 Procedure PT Treatments $Gait Training: 8-22 mins   PT G Codes:        Philomena Doheny 09/20/2015, 11:21 AM [phone]

## 2015-09-20 NOTE — Care Management Note (Signed)
Case Management Note  Patient Details  Name: HUDIE PICKEN MRN: [id number] Date of Birth: [birth year]  Subjective/Objective:            S/p right total knee arthroplasty        Action/Plan: Set up with [facility] for HHPT and [place] by MD office. Spoke with patient, no change in discharge plan. Patient stated that his wife will be assisting after discharge. [employer] delivered CPM to home and patient already had rolling walker and 3N1.     Expected Discharge Date:                  Expected Discharge Plan:  [place]  In-House Referral:  NA  Discharge planning Services  CM Consult  Post Acute Care Choice:  Home Health, Durable Medical Equipment Choice offered to:  Patient  DME Arranged:  CPM DME Agency:  [employer]  HH Arranged:  PT, OT HH Agency:  [facility]  Status of Service:  Completed, signed off  Medicare Important Message Given:    Date Medicare IM Given:    Medicare IM give by:    Date Additional Medicare IM Given:    Additional Medicare Important Message give by:     If discussed at [place] of Stay Meetings, dates discussed:    Additional Comments:  Nila Nephew, RN 09/20/2015, 12:06 PM

## 2015-09-20 NOTE — Progress Notes (Signed)
Orthopedic Tech Progress Note Patient Details:  [name] 1960-09-24 [id number]  Patient ID: [name], male   DOB: 10/26/1960, 55 y.o.   MRN: [id number] Pt already in cpm  Karolee Stamps 09/20/2015, 6:02 AM

## 2015-09-20 NOTE — Progress Notes (Signed)
Utilization review completed.  

## 2015-09-20 NOTE — Discharge Summary (Signed)
Patient ID: [name] MRN: [id number] DOB/AGE: 56/09/1960 56 y.o.  Admit date: 09/19/2015 Discharge date: 09/20/2015  Admission Diagnoses:  Principal Problem:   Primary osteoarthritis of right knee Active Problems:   Arthritis of right knee   Discharge Diagnoses:  Same  Past Medical History  Diagnosis Date  . BPH (benign prostatic hyperplasia)   . Depression   . Hidradenitis   . Morbid obesity ([place])   . Anxiety   . GERD (gastroesophageal reflux disease)   . Muscle spasm of both lower legs     takes Baclofen daily as needed;notices in hands as well  . Joint pain   . DDD (degenerative disc disease), lumbar   . Urinary frequency   . Urinary urgency   . Bipolar affective ([place])     takes Seroquel nightly  . Pneumonia 06/2015    put on Prednisone every other day  . OSA (obstructive sleep apnea)     does not use CPAP; "mostly cured w/gastric bypass; borderline result in 2013 sleep study" (09/19/2015)  . Iron deficiency anemia   . Arthritis     "knees, some in my ankles; back" (09/19/2015)  . Kidney stones     "passed"    Surgeries: Procedure(s): TOTAL KNEE ARTHROPLASTY on 09/19/2015   Consultants:    Discharged Condition: Improved  Hospital Course: [name] is an 55 y.o. male who was admitted 09/19/2015 for operative treatment ofPrimary osteoarthritis of right knee. Patient has severe unremitting pain that affects sleep, daily activities, and work/hobbies. After pre-op clearance the patient was taken to the operating room on 09/19/2015 and underwent  Procedure(s): TOTAL KNEE ARTHROPLASTY.    Patient was given perioperative antibiotics: Anti-infectives    Start     Dose/Rate Route Frequency Ordered Stop   09/19/15 2200  amoxicillin-clavulanate (AUGMENTIN) 875-125 MG per tablet 1 tablet     1 tablet Oral 2 times daily 09/19/15 1900 09/24/15 2159   09/19/15 1623  cefUROXime (ZINACEF) injection  Status:  Discontinued       As needed 09/19/15 1623 09/19/15 1710   09/19/15  0600  ceFAZolin (ANCEF) 3 g in dextrose 5 % 50 mL IVPB  Status:  Discontinued     3 g 130 mL/hr over 30 Minutes Intravenous To [facility] 09/18/15 1247 09/19/15 1824       Patient was given sequential compression devices, early ambulation, and chemoprophylaxis to prevent DVT.  Patient benefited maximally from hospital stay and there were no complications.    Recent vital signs: Patient Vitals for the past 24 hrs:  BP Temp Temp src Pulse Resp SpO2  09/20/15 1436 (!) 156/91 mmHg 98.8 F (37.1 C) Oral 90 18 100 %  09/20/15 0325 125/89 mmHg 97.5 F (36.4 C) Oral 91 18 97 %  09/19/15 2144 - 98.6 F (37 C) Oral 78 18 -  09/19/15 1843 (!) 132/91 mmHg 98.9 F (37.2 C) - 68 18 98 %  09/19/15 1815 - 98.4 F (36.9 C) - 73 19 97 %  09/19/15 1800 (!) 124/92 mmHg - - 66 13 92 %  09/19/15 1745 120/84 mmHg - - 70 14 96 %  09/19/15 1730 133/79 mmHg - - 68 14 99 %  09/19/15 1715 119/77 mmHg 98.3 F (36.8 C) - 78 14 98 %     Recent laboratory studies:  Recent Labs  09/20/15 0529  WBC 7.6  HGB 10.4*  HCT 33.4*  PLT 223  NA 140  K 4.1  CL 103  CO2 27  BUN 15  CREATININE 0.83  GLUCOSE 125*  CALCIUM 8.6*     Discharge Medications:     Medication List    TAKE these medications        acetaminophen 500 MG tablet  Commonly known as:  TYLENOL  Take 1,000 mg by mouth every 8 (eight) hours as needed for mild pain or moderate pain.     ALPRAZolam 1 MG tablet  Commonly known as:  XANAX  Take 1 mg by mouth 3 (three) times daily as needed for anxiety.     amoxicillin-clavulanate 875-125 MG tablet  Commonly known as:  AUGMENTIN  Take 1 tablet by mouth 2 (two) times daily.     aspirin EC 325 MG tablet  Take 1 tablet (325 mg total) by mouth 2 (two) times daily.     baclofen 10 MG tablet  Commonly known as:  LIORESAL  Take 10 mg by mouth 3 (three) times daily.     calcium citrate 950 MG tablet  Commonly known as:  CALCITRATE - dosed in mg elemental calcium  Take 2  tablets by mouth daily.     diclofenac 75 MG EC tablet  Commonly known as:  VOLTAREN  Take 75 mg by mouth 2 (two) times daily.     ferrous sulfate 325 (65 FE) MG tablet  Take 325 mg by mouth every other day.     Fish Oil 1000 MG Caps  Take 2,000 mg by mouth daily.     fluticasone 50 MCG/ACT nasal spray  Commonly known as:  FLONASE  Place 2 sprays into both nostrils daily.     multivitamin tablet  Take 1 tablet by mouth daily.     oxyCODONE-acetaminophen 5-325 MG tablet  Commonly known as:  ROXICET  Take 1 tablet by mouth every 4 (four) hours as needed.     QUEtiapine 300 MG tablet  Commonly known as:  SEROQUEL  Take 300-600 mg by mouth at bedtime.        Diagnostic Studies: No results found.  Disposition: 06-Home-Health Care Svc      Discharge Instructions    CPM    Complete by:  As directed   Continuous passive motion machine (CPM):      Use the CPM from 0 to 60  for 5 hours per day.      You may increase by 10 degrees per day.  You may break it up into 2 or 3 sessions per day.      Use CPM for 2 weeks or until you are told to stop.     Call MD / Call 911    Complete by:  As directed   If you experience chest pain or shortness of breath, CALL 911 and be transported to the hospital emergency room.  If you develope a fever above 101 F, pus (white drainage) or increased drainage or redness at the wound, or calf pain, call your surgeon's office.     Constipation Prevention    Complete by:  As directed   Drink plenty of fluids.  Prune juice may be helpful.  You may use a stool softener, such as Colace (over the counter) 100 mg twice a day.  Use MiraLax (over the counter) for constipation as needed.     Diet - low sodium heart healthy    Complete by:  As directed      Driving restrictions    Complete by:  As directed   No driving for 2 weeks  Increase activity slowly as tolerated    Complete by:  As directed      Patient may shower    Complete by:  As directed    You may shower without a dressing once there is no drainage.  Do not wash over the wound.  If drainage remains, cover wound with plastic wrap and then shower.           Follow-up Information    Follow up with Kerin Salen, MD In 2 weeks.   Specialty:  Orthopedic Surgery   Contact information:   [place] [address] [phone]       Follow up with [facility].   Why:  They will contact you to schedule home therapy visits.   Contact information:   [address] [place] [address] [phone]        Signed: Hardin Negus, Wanda Rideout R 09/20/2015, 4:49 PM

## 2015-12-25 ENCOUNTER — Encounter (HOSPITAL_BASED_OUTPATIENT_CLINIC_OR_DEPARTMENT_OTHER): Payer: Self-pay | Admitting: *Deleted

## 2015-12-25 ENCOUNTER — Emergency Department (HOSPITAL_BASED_OUTPATIENT_CLINIC_OR_DEPARTMENT_OTHER): Payer: BLUE CROSS/BLUE SHIELD

## 2015-12-25 ENCOUNTER — Inpatient Hospital Stay (HOSPITAL_BASED_OUTPATIENT_CLINIC_OR_DEPARTMENT_OTHER)
Admission: EM | Admit: 2015-12-25 | Discharge: 2015-12-28 | DRG: 603 | Disposition: A | Discharge status: Home / Self Care | Payer: BLUE CROSS/BLUE SHIELD | Attending: Internal Medicine | Admitting: Internal Medicine

## 2015-12-25 DIAGNOSIS — Z7951 Long term (current) use of inhaled steroids: Secondary | ICD-10-CM

## 2015-12-25 DIAGNOSIS — L03115 Cellulitis of right lower limb: Secondary | ICD-10-CM | POA: Diagnosis not present

## 2015-12-25 DIAGNOSIS — G4733 Obstructive sleep apnea (adult) (pediatric): Secondary | ICD-10-CM | POA: Diagnosis present

## 2015-12-25 DIAGNOSIS — L03031 Cellulitis of right toe: Secondary | ICD-10-CM | POA: Diagnosis not present

## 2015-12-25 DIAGNOSIS — B958 Unspecified staphylococcus as the cause of diseases classified elsewhere: Secondary | ICD-10-CM | POA: Diagnosis present

## 2015-12-25 DIAGNOSIS — K219 Gastro-esophageal reflux disease without esophagitis: Secondary | ICD-10-CM | POA: Diagnosis present

## 2015-12-25 DIAGNOSIS — Z87891 Personal history of nicotine dependence: Secondary | ICD-10-CM

## 2015-12-25 DIAGNOSIS — R739 Hyperglycemia, unspecified: Secondary | ICD-10-CM | POA: Diagnosis not present

## 2015-12-25 DIAGNOSIS — M2040 Other hammer toe(s) (acquired), unspecified foot: Secondary | ICD-10-CM | POA: Diagnosis present

## 2015-12-25 DIAGNOSIS — L97519 Non-pressure chronic ulcer of other part of right foot with unspecified severity: Secondary | ICD-10-CM | POA: Diagnosis present

## 2015-12-25 DIAGNOSIS — Z6835 Body mass index (BMI) 35.0-35.9, adult: Secondary | ICD-10-CM

## 2015-12-25 DIAGNOSIS — F419 Anxiety disorder, unspecified: Secondary | ICD-10-CM | POA: Diagnosis present

## 2015-12-25 DIAGNOSIS — L0390 Cellulitis, unspecified: Secondary | ICD-10-CM | POA: Diagnosis present

## 2015-12-25 DIAGNOSIS — Z7982 Long term (current) use of aspirin: Secondary | ICD-10-CM

## 2015-12-25 DIAGNOSIS — F319 Bipolar disorder, unspecified: Secondary | ICD-10-CM | POA: Diagnosis present

## 2015-12-25 DIAGNOSIS — M21961 Unspecified acquired deformity of right lower leg: Secondary | ICD-10-CM | POA: Diagnosis present

## 2015-12-25 DIAGNOSIS — F411 Generalized anxiety disorder: Secondary | ICD-10-CM | POA: Diagnosis not present

## 2015-12-25 DIAGNOSIS — N401 Enlarged prostate with lower urinary tract symptoms: Secondary | ICD-10-CM | POA: Diagnosis present

## 2015-12-25 DIAGNOSIS — R509 Fever, unspecified: Secondary | ICD-10-CM | POA: Diagnosis not present

## 2015-12-25 LAB — BASIC METABOLIC PANEL
Anion gap: 7 (ref 5–15)
BUN: 11 mg/dL (ref 6–20)
CHLORIDE: 105 mmol/L (ref 101–111)
CO2: 22 mmol/L (ref 22–32)
CREATININE: 0.78 mg/dL (ref 0.61–1.24)
Calcium: 8.1 mg/dL — ABNORMAL LOW (ref 8.9–10.3)
Glucose, Bld: 152 mg/dL — ABNORMAL HIGH (ref 65–99)
POTASSIUM: 3.6 mmol/L (ref 3.5–5.1)
SODIUM: 134 mmol/L — AB (ref 135–145)

## 2015-12-25 LAB — CBC WITH DIFFERENTIAL/PLATELET
Basophils Absolute: 0 10*3/uL (ref 0.0–0.1)
Basophils Relative: 0 %
EOS ABS: 0 10*3/uL (ref 0.0–0.7)
Eosinophils Relative: 1 %
HCT: 36.7 % — ABNORMAL LOW (ref 39.0–52.0)
HEMOGLOBIN: 12.1 g/dL — AB (ref 13.0–17.0)
Lymphocytes Relative: 13 %
Lymphs Abs: 0.5 10*3/uL — ABNORMAL LOW (ref 0.7–4.0)
MCH: 28.5 pg (ref 26.0–34.0)
MCHC: 33 g/dL (ref 30.0–36.0)
MCV: 86.4 fL (ref 78.0–100.0)
MONO ABS: 0.2 10*3/uL (ref 0.1–1.0)
Monocytes Relative: 6 %
NEUTROS PCT: 80 %
Neutro Abs: 2.8 10*3/uL (ref 1.7–7.7)
PLATELETS: 199 10*3/uL (ref 150–400)
RBC: 4.25 MIL/uL (ref 4.22–5.81)
RDW: 21.3 % — ABNORMAL HIGH (ref 11.5–15.5)
WBC: 3.5 10*3/uL — AB (ref 4.0–10.5)

## 2015-12-25 LAB — I-STAT CG4 LACTIC ACID, ED: LACTIC ACID, VENOUS: 0.63 mmol/L (ref 0.5–1.9)

## 2015-12-25 NOTE — ED Notes (Signed)
Back from xray, alert, NAD, calm.  ?

## 2015-12-25 NOTE — ED Triage Notes (Addendum)
Here for fever, seen by [facility] yesterday, started on doxycycline, concerned for recent tick bite, possible PNA and toe infection (denies h/o DM). Reports fever, chills, productive cough, body aches, joint aches, and toe redness, pus, swelling after nail avulsed, (denies: urinary sx, nv, bleeding), rates general pain as 5/10. Tylenol 1000mg  taken at 2015. Pt "was notified that chest xray was negative, but looked like fluid on lung". Tick was noted 4-6 weeks ago on head, no obvious rash noted.
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
## 2015-12-25 NOTE — ED Notes (Signed)
Patient transported to X-ray 

## 2015-12-26 ENCOUNTER — Telehealth: Payer: Self-pay | Admitting: *Deleted

## 2015-12-26 DIAGNOSIS — L03115 Cellulitis of right lower limb: Secondary | ICD-10-CM

## 2015-12-26 LAB — SEDIMENTATION RATE: Sed Rate: 9 mm/hr (ref 0–16)

## 2015-12-26 MED ORDER — DICLOFENAC SODIUM 75 MG PO TBEC
75.0000 mg | DELAYED_RELEASE_TABLET | Freq: Two times a day (BID) | ORAL | Status: DC | PRN
Start: 2015-12-26 — End: 2015-12-28
  Administered 2015-12-27: 75 mg via ORAL
  Filled 2015-12-26 (×2): qty 1

## 2015-12-26 MED ORDER — DICLOFENAC SODIUM 75 MG PO TBEC
75.0000 mg | DELAYED_RELEASE_TABLET | Freq: Two times a day (BID) | ORAL | Status: DC
  Administered 2015-12-26 (×2): 75 mg via ORAL
  Filled 2015-12-26 (×3): qty 1

## 2015-12-26 MED ORDER — ENOXAPARIN SODIUM 60 MG/0.6ML ~~LOC~~ SOLN
60.0000 mg | SUBCUTANEOUS | Status: DC
  Administered 2015-12-26 – 2015-12-28 (×3): 60 mg via SUBCUTANEOUS
  Filled 2015-12-26 (×3): qty 0.6

## 2015-12-26 MED ORDER — DULOXETINE HCL 60 MG PO CPEP
60.0000 mg | ORAL_CAPSULE | Freq: Every day | ORAL | Status: DC
  Administered 2015-12-26 – 2015-12-27 (×2): 60 mg via ORAL
  Filled 2015-12-26 (×2): qty 1

## 2015-12-26 MED ORDER — MUPIROCIN CALCIUM 2 % EX CREA
TOPICAL_CREAM | Freq: Every day | CUTANEOUS | Status: DC
  Administered 2015-12-26 – 2015-12-27 (×2): via TOPICAL
  Filled 2015-12-26: qty 15

## 2015-12-26 MED ORDER — VANCOMYCIN HCL 10 G IV SOLR
2000.0000 mg | INTRAVENOUS | Status: AC
  Administered 2015-12-26: 2000 mg via INTRAVENOUS
  Filled 2015-12-26: qty 2000

## 2015-12-26 MED ORDER — BACLOFEN 10 MG PO TABS
10.0000 mg | ORAL_TABLET | Freq: Three times a day (TID) | ORAL | Status: DC
  Administered 2015-12-26 – 2015-12-28 (×7): 10 mg via ORAL
  Filled 2015-12-26 (×9): qty 1

## 2015-12-26 MED ORDER — QUETIAPINE FUMARATE 300 MG PO TABS
300.0000 mg | ORAL_TABLET | Freq: Every day | ORAL | Status: DC
  Administered 2015-12-26 – 2015-12-27 (×2): 600 mg via ORAL
  Filled 2015-12-26 (×3): qty 2

## 2015-12-26 MED ORDER — ALPRAZOLAM 0.5 MG PO TABS
1.0000 mg | ORAL_TABLET | Freq: Three times a day (TID) | ORAL | Status: DC | PRN
  Administered 2015-12-26: 1 mg via ORAL
  Filled 2015-12-26: qty 2

## 2015-12-26 MED ORDER — VANCOMYCIN HCL 10 G IV SOLR
1250.0000 mg | Freq: Two times a day (BID) | INTRAVENOUS | Status: DC
  Administered 2015-12-26 – 2015-12-28 (×4): 1250 mg via INTRAVENOUS
  Filled 2015-12-26 (×5): qty 1250

## 2015-12-26 MED ORDER — ACETAMINOPHEN 500 MG PO TABS
1000.0000 mg | ORAL_TABLET | Freq: Three times a day (TID) | ORAL | Status: DC | PRN
  Administered 2015-12-26 – 2015-12-27 (×3): 1000 mg via ORAL
  Filled 2015-12-26 (×3): qty 2

## 2015-12-26 MED ORDER — METHOCARBAMOL 500 MG PO TABS
500.0000 mg | ORAL_TABLET | Freq: Two times a day (BID) | ORAL | Status: DC | PRN
  Administered 2015-12-27: 500 mg via ORAL
  Filled 2015-12-26: qty 1

## 2015-12-26 MED ORDER — ASPIRIN EC 325 MG PO TBEC
325.0000 mg | DELAYED_RELEASE_TABLET | Freq: Two times a day (BID) | ORAL | Status: DC
  Administered 2015-12-26: 325 mg via ORAL
  Filled 2015-12-26 (×2): qty 1

## 2015-12-26 NOTE — Consult Note (Signed)
Mr. [name] was admitted yesterday for right second toe cellulitis. He is tried on vancomycin. He states that last weekend he started to notice a blister to the tip of his right second toe and subsequently became more painful. He went to the [facility] emergency department yesterday no surrounding cellulitis. X-rays were taken which did not reveal osteomy as others broken hardware across the first metatarsal cuneiform joint. He states that he was doing well from the foot surgery was having no issues to his foot until one week ago. Due to the cellulitis was admitted to the hospital. He states that since starting on vancomycin and his pain has decreased as well as he has not had any fevers or chills. He feels that he has been doing better since admission. No other complaints at this time.  Vitals:   12/26/15 0811 12/26/15 1111  BP: 117/77 (!) 119/54  Pulse: 75 79  Resp: 18 18  Temp: 98.3 F (36.8 C) 98.1 F (36.7 C)   General: AAO x3, NAD  Dermatological:Distal aspect of the second toe with edema as well as erythema to the level he MPJ. There is mild edema to the right foot with a mild increase in warmth but there is no erythema or ascending synovitis on the foot. At the distal aspect of the right second toe is a bulla with what appears to be some drainage underneath. Upon debridement there is a superficial wound and the toenail was removed There is no wound that probed to bone there is no undermining or tunneling. There is no fluctuance or crepitus. There is no malodor. There are no other open lesions or pre-ulcer lesions. The incisions and the prior surgery well healed.  Vascular: Dorsalis Pedis artery and Posterior Tibial artery pedal pulses are 2/4 bilateral with immedate capillary fill time. Pedal hair growth present.  There is no pain with calf compression, swelling, warmth, erythema.   Neruologic: Somewhat decreased sensation.  Musculoskeletal: Flexion contracture of the right second toe at the  level of the DIPJ.  Gait: Unassisted, Nonantalgic.   CBC    Component Value Date/Time   WBC 3.5 (L) 12/25/2015 2200   RBC 4.25 12/25/2015 2200   HGB 12.1 (L) 12/25/2015 2200   HCT 36.7 (L) 12/25/2015 2200   PLT 199 12/25/2015 2200   MCV 86.4 12/25/2015 2200   MCH 28.5 12/25/2015 2200   MCHC 33.0 12/25/2015 2200   RDW 21.3 (H) 12/25/2015 2200   LYMPHSABS 0.5 (L) 12/25/2015 2200   MONOABS 0.2 12/25/2015 2200   EOSABS 0.0 12/25/2015 2200   BASOSABS 0.0 12/25/2015 2200     BMP Latest Ref Rng & Units 12/25/2015 09/20/2015 09/07/2015  Glucose 65 - 99 mg/dL 152(H) 125(H) 95  BUN 6 - 20 mg/dL 11 15 13   Creatinine 0.61 - 1.24 mg/dL 0.78 0.83 0.84  Sodium 135 - 145 mmol/L 134(L) 140 142  Potassium 3.5 - 5.1 mmol/L 3.6 4.1 3.8  Chloride 101 - 111 mmol/L 105 103 108  CO2 22 - 32 mmol/L 22 27 23   Calcium 8.9 - 10.3 mg/dL 8.1(L) 8.6(L) 9.4     Assessment: Right second toe cellulitis, ulceration  Plan: -Treatment options discussed including all alternatives, risks, and complications -Etiology of symptoms were discussed -Chart and x-rays are reviewed. -I do not believe that the broken hardware and the foot is the cause of the infection or any problems to the second toe. -Second toe had a bulla as well as a wound distal aspect which was debrided today.  Upon debridement of the nail did come off. There is a superficial granular wound on the dorsal aspect of the right second toe. There is no deep probing. Smaller seroserous drainage was expressed and this was sent for culture. Continue daily dressing changes for now. -Continue antibiotics, vancomycin. Follow wound culture. -Recommend surgical shoe -Discussed the possible surgical intervention however we will await response of antibiotics. -We will continue to follow patient while admitted. Any questions please feel free to call us. Thank you for the consult.  Celesta Gentile, [place] Office: [phone] Cell:  [phone]

## 2015-12-26 NOTE — Progress Notes (Signed)
This is a no charge note   Transfer from [facility] per Dr. Jeneen Rinks.  55 year old man with past medical history of GERD, depression, anxiety, OSA, obesity, BPH, bipolar disorder, congenital right foot abnormalities, who presents with fever, right second toe infection.  Patient developed infection in the second toe after nail avulsed a week ago. He also had possible tick bite on head 4 to 6 weeks ago. He developed fever, cough, body aches. He was seen in [facility] and started with doxycycline yesterday without improvement. X-ray of right foot has no bony fracture or signs of osteomyelitis.  WBC 3.5, temperature 99.8, mild tachycardia, electrolytes renal function okay negative chest x-ray, lactate 0.63, hemodynamically stable. Pt is accepted to tele bed for obs.  Ivor Costa, MD  [facility] Pager [phone]  If 7PM-7AM, please contact night-coverage [email or web address] Password [facility] 12/26/2015, 12:24 AM

## 2015-12-26 NOTE — Progress Notes (Addendum)
Progress Note    [name]  EXM:[id number] DOB: [birth year]  DOA: 12/25/2015 PCP: Velna Hatchet, MD    Brief Narrative:   [name] is an 55 y.o. 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 Imaging was negative for osteomyelitis but did show hardware failure. He also reported a tick bite approximately 4-6 weeks ago for which he sought medical attention and received a dose of Rocephin and was placed on doxycycline.  Assessment/Plan:   Principal Problem:   Cellulitis of second toe of right foot Concern for hardware failure noted on plain films, but no evidence of osteomyelitis.  Evaluated by Juana Di­az RN.  Spoke with the patient's foot surgeon, Dr. Ila Mcgill, who will have one of his partners come and see him.  Continue Vancomycin.  F/U blood cultures. Check ESR.    Family Communication/Anticipated D/C date and plan/Code Status   DVT prophylaxis: Lovenox ordered. Code Status: Full Code.  Family Communication: No family at the bedside. Disposition Plan: Home when stable.   Medical Consultants:    Podiatry   Procedures:    Anti-Infectives:   Vancomycin 12/26/15--->  Subjective:    [name] denies any pain unless his toe is manipulated. No other specific complaints. No fevers.  Objective:    Vitals:   12/25/15 2330 12/25/15 2349 12/26/15 0034 12/26/15 0244  BP: 112/70  121/82 105/66  Pulse:   93 95  Resp: _0 Temp:    98.6 F (37 C)  TempSrc:    Oral  SpO2:   100% 97%  Weight:    125.5 kg (276 lb 11.2 oz)  Height:    _1  (1.905 m)    Intake/Output Summary (Last 24 hours) at 12/26/15 0811 Last data filed at 12/26/15 0500  Gross per 24 hour  Intake              740 ml  Output              300 ml  Net              440 ml   Filed Weights   12/25/15 2132 12/26/15 0244  Weight: 127 kg (280 lb) 125.5 kg (276 lb 11.2 oz)    Exam: General exam: Appears calm and comfortable.  Respiratory system: Clear to auscultation. Respiratory effort normal. Cardiovascular system: S1 & S2 heard, RRR. No JVD,  rubs, gallops or clicks. No murmurs. Gastrointestinal system: Abdomen is nondistended, soft and nontender. No organomegaly or masses felt. Normal bowel sounds heard. Central nervous system: Alert and oriented. No focal neurological deficits. Extremities: LLE >> RLE edema (chronic). Skin: No rashes, lesions or ulcers other than as pictured below. Psychiatry: Judgement and insight appear normal. Mood & affect appropriate.     Data Reviewed:   I have personally reviewed following labs and imaging studies:  Labs: Basic Metabolic Panel:  Recent Labs Lab 12/25/15 2200  NA 134*  K 3.6  CL 105  CO2 22  GLUCOSE 152*  BUN 11  CREATININE 0.78  CALCIUM 8.1*   GFR Estimated Creatinine Clearance: 147.1 mL/min (by C-G formula based on SCr of 0.8 mg/dL). Liver Function Tests: No results for input(s): AST, ALT, ALKPHOS, BILITOT, PROT, ALBUMIN in the last 168 hours. No  results for input(s): LIPASE, AMYLASE in the last 168 hours. No results for input(s): AMMONIA in the last 168 hours. Coagulation profile No results for input(s): INR, PROTIME in the last 168 hours.  CBC:  Recent Labs Lab 12/25/15 2200  WBC 3.5*  NEUTROABS 2.8  HGB 12.1*  HCT 36.7*  MCV 86.4  PLT 199   Sepsis Labs:  Recent Labs Lab 12/25/15 2200 12/25/15 2213  WBC 3.5*  --   LATICACIDVEN  --  0.63   Urine analysis:    Component Value Date/Time   COLORURINE YELLOW 09/07/2015 0843   APPEARANCEUR CLEAR 09/07/2015 0843   LABSPEC 1.018 09/07/2015 0843   PHURINE 5.5 09/07/2015 0843   GLUCOSEU 250 (A) 09/07/2015 0843   HGBUR NEGATIVE 09/07/2015 0843   BILIRUBINUR NEGATIVE 09/07/2015 0843   [name] 09/07/2015 0843   PROTEINUR NEGATIVE  09/07/2015 0843   UROBILINOGEN 0.2 09/26/2008 2042   NITRITE NEGATIVE 09/07/2015 0843   LEUKOCYTESUR NEGATIVE 09/07/2015 0843   Microbiology No results found for this or any previous visit (from the past 240 hour(s)).  Radiology: Dg Chest 2 View  Result Date: 12/25/2015 CLINICAL DATA:  55 year old male with fever and cough EXAM: CHEST  2 VIEW COMPARISON:  Chest radiograph dated 07/06/2015 FINDINGS: Two views of the chest demonstrate mild emphysematous changes of the lungs with linear and platelike atelectasis/ scarring in the right mid lung field. There is no focal consolidation, pleural effusion, or pneumothorax. The cardiac silhouette is within normal limits. No acute osseous pathology identified. IMPRESSION: No active cardiopulmonary disease. Electronically Signed   By: Anner Crete M.D.   On: 12/25/2015 22:30  Dg Foot Complete Right  Result Date: 12/25/2015 CLINICAL DATA:  Right second toe redness and swelling. Fever and chills. EXAM: RIGHT FOOT COMPLETE - 3+ VIEW COMPARISON:  None. FINDINGS: No definite fracture or dislocation. No discrete areas of osteolysis with special attention paid the second digit though note, evaluation is somewhat degraded secondary to hammertoe deformity. There is mild apparent soft tissue swelling about the forefoot. No discrete areas of subcutaneous emphysema. No radiopaque foreign body. Post cancellous screw fixation of the second and fifth metatarsal heads. Stable sequela of lag screw fixation of the first TMT joint with unchanged fracture of 1 of the cancellous screws. Persistent lucency surrounds the screws compatible with loosening. Cerclage wire again noted about the first metatarsal head. Advanced degenerative change of multiple midfoot articulations, similar to prior examinations. IMPRESSION: 1. No fracture or radiographic evidence of osteomyelitis with special attention paid to the second digit. 2. Otherwise, stable left change of the foot including  hardware failure involving the fixation of the first TMT joint as detailed above. Electronically Signed   By: Sandi Mariscal M.D.   On: 12/25/2015 22:34   Medications:   . aspirin EC  325 mg Oral BID  . baclofen  10 mg Oral TID  . diclofenac  75 mg Oral BID  . enoxaparin (LOVENOX) injection  60 mg Subcutaneous Q24H  . QUEtiapine  300-600 mg Oral QHS  . vancomycin  1,250 mg Intravenous Q12H   Continuous Infusions:   Time spent: 25 minutes.   LOS: 1 day   Julen Rubert  [facility] Pager [phone]. If unable to reach me by pager, please call my cell phone at [phone].  *Please refer to [email or web address], password [email or web address] to get updated schedule on who will round on this patient, as hospitalists switch teams weekly. If 7PM-7AM, please contact night-coverage at [email or web address], password [email or web address] for any overnight needs.  12/26/2015, 8:11 AM

## 2015-12-26 NOTE — Telephone Encounter (Addendum)
Dr. Rockne Menghini states pt had HAV surgery on right foot and has presented to [facility] with cellulitis of right 2nd toe, fever and systemic symptom, radiograph shows no osteomyelitis, but there is hardware failure of right 1st MTPJ.  Dr. Rockne Menghini request inpt consultation, pt is in [facility] Room 13.  DOS 04/12/2013 for B/L bunionectomy. 01/04/2016-Pt states while working today the scabbing scrubbed off in his boot.  Pt states he fills the padding is causing the scab to come off. Pt's employer won't allow him to work until the area has a sturdy scab. Pt is taking Clindamycin and covering the right 2nd toe with neosporin then using buttress pad to lift tip. Dr. Daylene Katayama cleaned pt's right 2nd toe, debrided area and dressed with Iodosorb and recommended pt be seen 01/14/2016 and out of work until 01/14/2016 evaluation.

## 2015-12-26 NOTE — Consult Note (Addendum)
[place] Nurse wound consult note Reason for Consult: Consult requested for toe wound.  Pt states it became swollen recently. Wound type: Right second toe with full thickness involvement; generalized edema and fluctuance to area 3X2cm Dark purple-red, no odor, small amt yellow drainage. Dressing procedure/placement/frequency: Pt could benefit from ortho consult, related to previous hardware inside this location.  X-ray reads: "There is mild apparent soft tissue swelling about the forefoot. No discrete areas of subcutaneous emphysema. Post cancellous screw fixation of the second and fifth metatarsal heads. Persistent lucency surrounds the screws compatible with loosening."  Plan: Bactroban to provide antimicrobial benefits. Pt is already on systemic antibiotic coverage. Discussed plan of care with patient. Please re-consult if further assistance is needed.  Thank-you,  Julien Girt MSN, [place], [place], [place], [place]
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
## 2015-12-26 NOTE — Progress Notes (Signed)
Pharmacy Antibiotic Note  [name] is a 55 y.o. male admitted on 12/25/2015 with R second toe infection and fever.  Pharmacy has been consulted for Vancomycin dosing.  Plan: Vancomycin 2gm IV now then 1250mg  IV q12h Will f/u micro data, renal function, and pt's clinical condition Vanc trough at C[facility] in obese pt   Height: 6\' 3"  (190.5 cm) Weight: 276 lb 11.2 oz (125.5 kg) IBW/kg (Calculated) : 84.5  Temp (24hrs), Avg:99.2 F (37.3 C), Min:98.6 F (37 C), Max:99.8 F (37.7 C)   Recent Labs Lab 12/25/15 2200 12/25/15 2213  WBC 3.5*  --   CREATININE 0.78  --   LATICACIDVEN  --  0.63    Estimated Creatinine Clearance: 147.1 mL/min (by C-G formula based on SCr of 0.8 mg/dL).    Allergies  Allergen Reactions  . Sulfa Antibiotics Itching and Rash    More severe reaction 3 years ago, caused pain.  Had taken it prior and not as bad.    Antimicrobials this admission: 7/26 Vanc >>   Dose adjustments this admission: n/a  Microbiology results: 7/25 BCx:   Thank you for allowing pharmacy to be a part of this patient's care.  Sherlon Handing, PharmD, BCPS Clinical pharmacist, pager [phone] 12/26/2015 3:04 AM

## 2015-12-27 DIAGNOSIS — L0390 Cellulitis, unspecified: Secondary | ICD-10-CM | POA: Diagnosis present

## 2015-12-27 DIAGNOSIS — R739 Hyperglycemia, unspecified: Secondary | ICD-10-CM

## 2015-12-27 DIAGNOSIS — F411 Generalized anxiety disorder: Secondary | ICD-10-CM

## 2015-12-27 DIAGNOSIS — L03031 Cellulitis of right toe: Principal | ICD-10-CM

## 2015-12-27 LAB — B. BURGDORFI ANTIBODIES

## 2015-12-27 NOTE — Progress Notes (Addendum)
PROGRESS NOTE  [name]  [id number] DOB: 18-Jan-1961 DOA: 12/25/2015 PCP: Velna Hatchet, MD  Brief Narrative:   [name] is a 55 y.o. male with medical history significant of HAV deformity of the R foot.  Patient presents to the ED with c/o fever and chills.  Symptoms onset yesterday.  Patient initially seen in UC but forgot to mention pain and possible infection of the R second toe which he noticed 2-3 days ago.  Instead he mentioned a tick bite some 4-6 weeks ago, got a shot of rocephin and doxycyline for Tm of 103 at that time.  After getting home his wife brought up concern for his toe and made him go to the ED.   Assessment & Plan:   Principal Problem:   Cellulitis of second toe of right foot  Cellulitis of R 2nd toe s/p bedside debridement by podiatry on 7/26, improving                       Empiric vanc                       Cultures:  Rare gram positive cocci                       Podiatry consult appreciated                       No evidence on plain film x ray of sub q air or bone involvement.                       check HIV and A1c  Tick bite - 4-6 weeks ago                       Well out of window for RMSF, will go ahead and get Lyme Ab but holding off on doxycycline for the moment since his fever and systemic symptoms seem more likely related to the toe.  Anxiety, stable, continue duloxetine  DVT prophylaxis:  lovenox Code Status:  full Family Communication:  Patient alone Disposition Plan:  Possibly home tomorrow if toe continues to improve   Consultants:   Podiatry, Celesta Gentile  Procedures:  Bedside debridement of 2nd toe right foot with toenail removal on 7/26  Antimicrobials:   Vancomycin 7/26    Subjective: Toe much less red and swollen today.  Continues to feel fatigued and have fevers and chills.  Had diarrhea yesterday but resolved since.  Objective: Vitals:   12/26/15 2039 12/27/15 0009 12/27/15 0530 12/27/15 1147  BP: (!) 126/59  123/77 123/88 116/68  Pulse: 87 96 82 77  Resp: 18 20 20 18   Temp: 98.6 F (37 C) 97.5 F (36.4 C) 98.5 F (36.9 C) 98.6 F (37 C)  TempSrc: Oral Oral Oral Oral  SpO2: 97% 95% 100% 98%  Weight:   125.3 kg (276 lb 4.8 oz)   Height:        Intake/Output Summary (Last 24 hours) at 12/27/15 1436 Last data filed at 12/27/15 1256  Gross per 24 hour  Intake              800 ml  Output             3575 ml  Net            -2775 ml   [employer]  12/25/15 2132 12/26/15 0244 12/27/15 0530  Weight: 127 kg (280 lb) 125.5 kg (276 lb 11.2 oz) 125.3 kg (276 lb 4.8 oz)    Examination:  General exam:  Adult male.  No acute distress.  HEENT:  NCAT, MMM Respiratory system: Clear to auscultation bilaterally Cardiovascular system: Regular rate and rhythm, normal S1/S2. No murmurs, rubs, gallops or clicks.  Warm extremities Gastrointestinal system: Normal active bowel sounds, soft, nondistended, nontender. MSK:  Normal tone and bulk.  Send toe of right foot swollen and erythematous.  Toe nail has been removed and no obvious residual purulence.  Dorsum of foot is swollen.  warm to touch.   Neuro:  Grossly intact    Data Reviewed: I have personally reviewed following labs and imaging studies  CBC:  Recent Labs Lab 12/25/15 2200  WBC 3.5*  NEUTROABS 2.8  HGB 12.1*  HCT 36.7*  MCV 86.4  PLT 123XX123   Basic Metabolic Panel:  Recent Labs Lab 12/25/15 2200  NA 134*  K 3.6  CL 105  CO2 22  GLUCOSE 152*  BUN 11  CREATININE 0.78  CALCIUM 8.1*   GFR: Estimated Creatinine Clearance: 147 mL/min (by C-G formula based on SCr of 0.8 mg/dL). Liver Function Tests: No results for input(s): AST, ALT, ALKPHOS, BILITOT, PROT, ALBUMIN in the last 168 hours. No results for input(s): LIPASE, AMYLASE in the last 168 hours. No results for input(s): AMMONIA in the last 168 hours. Coagulation Profile: No results for input(s): INR, PROTIME in the last 168 hours. Cardiac Enzymes: No results for  input(s): CKTOTAL, CKMB, CKMBINDEX, TROPONINI in the last 168 hours. BNP (last 3 results) No results for input(s): PROBNP in the last 8760 hours. HbA1C: No results for input(s): HGBA1C in the last 72 hours. CBG: No results for input(s): GLUCAP in the last 168 hours. Lipid Profile: No results for input(s): CHOL, HDL, LDLCALC, TRIG, CHOLHDL, LDLDIRECT in the last 72 hours. Thyroid Function Tests: No results for input(s): TSH, T4TOTAL, FREET4, T3FREE, THYROIDAB in the last 72 hours. Anemia Panel: No results for input(s): VITAMINB12, FOLATE, FERRITIN, TIBC, IRON, RETICCTPCT in the last 72 hours. Urine analysis:    Component Value Date/Time   COLORURINE YELLOW 09/07/2015 0843   APPEARANCEUR CLEAR 09/07/2015 0843   LABSPEC 1.018 09/07/2015 0843   PHURINE 5.5 09/07/2015 0843   GLUCOSEU 250 (A) 09/07/2015 0843   HGBUR NEGATIVE 09/07/2015 0843   BILIRUBINUR NEGATIVE 09/07/2015 0843   KETONESUR NEGATIVE 09/07/2015 0843   PROTEINUR NEGATIVE 09/07/2015 0843   UROBILINOGEN 0.2 09/26/2008 2042   NITRITE NEGATIVE 09/07/2015 0843   LEUKOCYTESUR NEGATIVE 09/07/2015 0843   Sepsis Labs: @LABRCNTIP (procalcitonin:4,lacticidven:4)  ) Recent Results (from the past 240 hour(s))  Aerobic Culture (superficial specimen)     Status: None (Preliminary result)   Collection Time: 12/26/15  7:33 PM  Result Value Ref Range Status   Specimen Description WOUND RIGHT TOE  Final   Special Requests NONE  Final   Gram Stain   Final    FEW WBC PRESENT, PREDOMINANTLY PMN RARE GRAM POSITIVE COCCI IN PAIRS    Culture TOO YOUNG TO READ  Final   Report Status PENDING  Incomplete      Radiology Studies: Dg Chest 2 View  Result Date: 12/25/2015 CLINICAL DATA:  55 year old male with fever and cough EXAM: CHEST  2 VIEW COMPARISON:  Chest radiograph dated 07/06/2015 FINDINGS: Two views of the chest demonstrate mild emphysematous changes of the lungs with linear and platelike atelectasis/ scarring in the right mid  lung  field. There is no focal consolidation, pleural effusion, or pneumothorax. The cardiac silhouette is within normal limits. No acute osseous pathology identified. IMPRESSION: No active cardiopulmonary disease. Electronically Signed   By: Anner Crete M.D.   On: 12/25/2015 22:30  Dg Foot Complete Right  Result Date: 12/25/2015 CLINICAL DATA:  Right second toe redness and swelling. Fever and chills. EXAM: RIGHT FOOT COMPLETE - 3+ VIEW COMPARISON:  None. FINDINGS: No definite fracture or dislocation. No discrete areas of osteolysis with special attention paid the second digit though note, evaluation is somewhat degraded secondary to hammertoe deformity. There is mild apparent soft tissue swelling about the forefoot. No discrete areas of subcutaneous emphysema. No radiopaque foreign body. Post cancellous screw fixation of the second and fifth metatarsal heads. Stable sequela of lag screw fixation of the first TMT joint with unchanged fracture of 1 of the cancellous screws. Persistent lucency surrounds the screws compatible with loosening. Cerclage wire again noted about the first metatarsal head. Advanced degenerative change of multiple midfoot articulations, similar to prior examinations. IMPRESSION: 1. No fracture or radiographic evidence of osteomyelitis with special attention paid to the second digit. 2. Otherwise, stable left change of the foot including hardware failure involving the fixation of the first TMT joint as detailed above. Electronically Signed   By: Sandi Mariscal M.D.   On: 12/25/2015 22:34    Scheduled Meds: . baclofen  10 mg Oral TID  . DULoxetine  60 mg Oral Daily  . enoxaparin (LOVENOX) injection  60 mg Subcutaneous Q24H  . mupirocin cream   Topical Daily  . QUEtiapine  300-600 mg Oral QHS  . vancomycin  1,250 mg Intravenous Q12H   Continuous Infusions:    LOS: 1 day    Time spent: 30 min    Janece Canterbury, MD [facility] Pager [phone]  If 7PM-7AM,  please contact night-coverage [email or web address] Password [facility] 12/27/2015, 2:36 PM

## 2015-12-27 NOTE — Progress Notes (Signed)
Ortho tech called to confirm order for shoe.  Order acknowledgement received.  Patient educated and verbalized understanding.

## 2015-12-27 NOTE — Progress Notes (Signed)
Podiatry Progress Note  Subjective: [name] is a [name] patient seen bedside for follow up evaluation of Right 2nd toe ulceration. Patient was admitted on 12/25/2015 for cellulitis. Patient was started on antibiotics and had toe debrided bedside with improvement in symptoms. Nursing has been assisting with dressing the toe Patient denies current nausea/vomitting/fever/chills/night sweats or any other symptoms.  Patient Active Problem List   Diagnosis Date Noted  . Cellulitis of second toe of right foot 12/25/2015  . Primary osteoarthritis of right knee 09/19/2015  . Arthritis of right knee 09/19/2015  . Degenerative arthritis of left knee 07/27/2015  . Primary osteoarthritis of left knee 07/21/2015  . Abnormal findings on diagnostic imaging of lung 07/06/2015  . Mediastinal lymphadenopathy   . Lung mass 06/08/2015  . Ex-cigarette smoker 06/08/2015  . Osteoarthritis of left knee 06/08/2015  . Chronic pain syndrome 06/08/2015  . Anxiety state 06/08/2015  . Sleep apnea with use of continuous positive airway pressure (CPAP) 11/01/2012  . 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 Tailor's bunion BILATERAL 09/29/2012  . 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 Hidradenitis of left thigh 05/09/2011  . BPH (benign prostatic hyperplasia) 11/21/2010  . Arthritis/joint pain 11/21/2010     Current Facility-Administered Medications:  .  acetaminophen (TYLENOL) tablet 1,000 mg, 1,000 mg, Oral, Q8H PRN, Etta Quill, DO, 1,000 mg at 12/27/15 0600 .  ALPRAZolam Duanne Moron) tablet 1 mg, 1 mg, Oral, TID PRN, Etta Quill, DO, 1 mg at 12/26/15 0404 .  baclofen (LIORESAL) tablet 10 mg, 10 mg, Oral, TID, Etta Quill, DO, 10 mg at 12/27/15 1729 .  diclofenac (VOLTAREN) EC tablet 75 mg, 75 mg, Oral, BID PRN, Venetia Maxon Rama, MD, 75 mg at 12/27/15 0600 .  DULoxetine (CYMBALTA) DR capsule 60 mg, 60 mg, Oral, Daily, Venetia Maxon Rama, MD, 60 mg at 12/26/15 2100 .   enoxaparin (LOVENOX) injection 60 mg, 60 mg, Subcutaneous, Q24H, Jared M Gardner, DO, 60 mg at 12/27/15 0747 .  methocarbamol (ROBAXIN) tablet 500 mg, 500 mg, Oral, BID PRN, Venetia Maxon Rama, MD .  mupirocin cream (BACTROBAN) 2 %, , Topical, Daily, Christina P Rama, MD .  QUEtiapine (SEROQUEL) tablet 300-600 mg, 300-600 mg, Oral, QHS, Jared M Gardner, DO, 600 mg at 12/26/15 2100 .  vancomycin (VANCOCIN) 1,250 mg in sodium chloride 0.9 % 250 mL IVPB, 1,250 mg, Intravenous, Q12H, Franky Macho, RPH, 1,250 mg at 12/27/15 1728  Allergies  Allergen Reactions  . Sulfa Antibiotics Itching and Rash    More severe reaction 3 years ago, caused pain.  Had taken it prior and not as bad.    Objective: Today's Vitals   12/27/15 0555 12/27/15 0700 12/27/15 0748 12/27/15 1147  BP:    116/68  Pulse:    77  Resp:    18  Temp:    98.6 F (37 C)  TempSrc:    Oral  SpO2:    98%  Weight:      Height:      PainSc: 5  2  0-No pain     General: Well developed, nourished, in no acute distress, alert and oriented x3   Dermatological: Skin is warm, dry and supple bilateral. To Right 2nd toe there is a superficial ulceration encompassing the nail bed with surrounding friable skin from blistering with active serous drainage expressed. Focal digital erythema to level of digit that does not extend onto foot with focal edema. Healed surgical scars.  No other open lesions.   Vascular: Dorsalis Pedis artery and Posterior Tibial artery pedal pulses are 2/4 bilateral with immedate capillary fill time. Pedal hair growth present.   Neruologic: Grossly intact via light touch bilateral.   Musculoskeletal:Hammertoe boney pedal deformities bilateral. No pain with palpation to ulcerated area. No pain with calf compression. Muscular strength 5/5 in all groups tested bilateral.  Results for orders placed or performed during the hospital encounter of 12/25/15 (from the past 24 hour(s))  Sedimentation rate     Status: None    Collection Time: 12/26/15  7:12 PM  Result Value Ref Range   Sed Rate 9 0 - 16 mm/hr  Aerobic Culture (superficial specimen)     Status: None (Preliminary result)   Collection Time: 12/26/15  7:33 PM  Result Value Ref Range   Specimen Description WOUND RIGHT TOE    Special Requests NONE    Gram Stain      FEW WBC PRESENT, PREDOMINANTLY PMN RARE GRAM POSITIVE COCCI IN PAIRS    Culture TOO YOUNG TO READ    Report Status PENDING    Assessment and Plan: 55 y.o. male patient with right 2nd toe ulceration and focal cellulitis, s/p bedside debridement with wound culture  -Patient seen and evaluated bedside -Chart reviewed -Dressing change performed; applied betadine and dry dressing to right 2nd toe and advised patient to do the same at home at discharge -Rx Post op shoe to use to prevent further skin breakdown and worsening of ulceration to toe, right foot -Weight-bear to tolerance -Pending final wound culture results -Continue IV Vancomycin antibiotics until culture results are available and then patient can be discharged home on PO antibiotics x 14 days base on sensitivities  -No acute surgical intervention warranted at this time -Patient to call office for follow up to be seen within 1 week after discharge. Advised patient that if he is discharged on tomorrow and the wound is still draining over the weekend then he will need to refrain from going to work on Monday since he can not wear post op shoe to work. He will have to refrain from work until ulceration improves and the area can tolerate a closed-toed shoe. Patient expressed understanding.  Landis Martins, [place] [phone] office  [phone] cell

## 2015-12-27 NOTE — Progress Notes (Signed)
Orthopedic Tech Progress Note Patient Details:  [name] Jul 26, 1960 [id number]  Ortho Devices Type of Ortho Device: Postop shoe/boot Ortho Device/Splint Interventions: Application   [name] 12/27/2015, 9:06 PM

## 2015-12-28 DIAGNOSIS — L03115 Cellulitis of right lower limb: Secondary | ICD-10-CM

## 2015-12-28 LAB — BASIC METABOLIC PANEL
Anion gap: 5 (ref 5–15)
BUN: 6 mg/dL (ref 6–20)
CALCIUM: 8.7 mg/dL — AB (ref 8.9–10.3)
CO2: 29 mmol/L (ref 22–32)
CREATININE: 0.64 mg/dL (ref 0.61–1.24)
Chloride: 108 mmol/L (ref 101–111)
GFR calc non Af Amer: >60 mL/min (ref >60)
Glucose, Bld: 97 mg/dL (ref 65–99)
Potassium: 4.2 mmol/L (ref 3.5–5.1)
Sodium: 142 mmol/L (ref 135–145)

## 2015-12-28 LAB — HIV ANTIBODY (ROUTINE TESTING W REFLEX): HIV Screen 4th Generation wRfx: NONREACTIVE

## 2015-12-28 MED ORDER — CLINDAMYCIN HCL 300 MG PO CAPS: 300.0000 mg | ORAL_CAPSULE | Freq: Four times a day (QID) | ORAL | 0 refills | Status: DC

## 2015-12-28 MED ORDER — SACCHAROMYCES BOULARDII 250 MG PO CAPS
250.0000 mg | ORAL_CAPSULE | Freq: Two times a day (BID) | ORAL | Status: DC
  Administered 2015-12-28: 250 mg via ORAL
  Filled 2015-12-28: qty 1

## 2015-12-28 MED ORDER — POVIDONE-IODINE 10 % EX SWAB
1.0000 | Freq: Two times a day (BID) | CUTANEOUS | 0 refills | Status: DC
Start: 2015-12-28 — End: 2016-12-25

## 2015-12-28 MED ORDER — SACCHAROMYCES BOULARDII 250 MG PO CAPS: 250.0000 mg | ORAL_CAPSULE | Freq: Two times a day (BID) | ORAL | 3 refills | Status: DC

## 2015-12-28 MED ORDER — CLINDAMYCIN HCL 300 MG PO CAPS
300.0000 mg | ORAL_CAPSULE | Freq: Four times a day (QID) | ORAL | Status: DC
  Administered 2015-12-28: 300 mg via ORAL
  Filled 2015-12-28: qty 1

## 2015-12-28 NOTE — Discharge Instructions (Signed)
Fingernail or Toenail Removal, Care After Refer to this sheet in the next few weeks. These instructions provide you with information about caring for yourself after your procedure. Your health care provider may also give you more specific instructions. Your treatment has been planned according to current medical practices, but problems sometimes occur. Call your health care provider if you have any problems or questions after your procedure. WHAT TO EXPECT AFTER THE PROCEDURE After your procedure, it is common to have:  Redness.  Swelling. HOME CARE INSTRUCTIONS  If you have a splint on your finger:  Wear it as directed by your health care provider. Remove it only as directed by your health care provider.  Loosen the splint if your fingers become numb and tingle, or if they turn cold and blue.  If you were given a surgical shoe, wear it as directed by your health care provider.  Take medicines only as directed by your health care provider.  Elevate your hand or foot as much of the time as possible. This helps with pain and swelling.  If you are recovering from fingernail removal, keep your hand raised above the level of your heart.  If you are recovering from toenail removal, lie on a bed or a couch with your leg propped up on pillows, or sit in a reclining chair with the footrest up.  Follow instructions from your health care provider about bandage (dressing) changes and removal:  Change your dressing 24 hours after your procedure or as directed by your health care provider.  Soak your hand or foot in warm, soapy water for 10-20 minutes or as directed by your health care provider. Do this 3 times per day or as directed by your health care provider. This reduces pain and swelling.  After you soak your hand or foot, apply a clean, dry dressing.  Keep your dressing clean and dry. Change your dressing whenever it gets wet or dirty.  Keep all follow-up visits as directed by your  health care provider. This is important. SEEK MEDICAL CARE IF:  You have increased redness or pain at your nail area.  You have increased fluid, blood, or pus coming from your nail area.  There is a bad smell coming from the dressing.  You have a fever.  Your swelling gets worse, or you have swelling that spreads from your finger to your hand or from your toe to your foot.  You have worsening redness that spreads from your finger to your hand or from your toe up to your foot.  Your finger or toe looks blue or black.   This information is not intended to replace advice given to you by your health care provider. Make sure you discuss any questions you have with your health care provider.   Document Released: 06/09/2014 Document Reviewed: 06/09/2014 Elsevier Interactive Patient Education 2016 [employer]   Cellulitis Cellulitis is an infection of the skin and the tissue beneath it. The infected area is usually red and tender. Cellulitis occurs most often in the arms and lower legs.  CAUSES  Cellulitis is caused by bacteria that enter the skin through cracks or cuts in the skin. The most common types of bacteria that cause cellulitis are staphylococci and streptococci. SIGNS AND SYMPTOMS   Redness and warmth.  Swelling.  Tenderness or pain.  Fever. DIAGNOSIS  Your health care provider can usually determine what is wrong based on a physical exam. Blood tests may also be done. TREATMENT  Treatment usually  involves taking an antibiotic medicine. HOME CARE INSTRUCTIONS   Take your antibiotic medicine as directed by your health care provider. Finish the antibiotic even if you start to feel better.  Keep the infected arm or leg elevated to reduce swelling.  Apply a warm cloth to the affected area up to 4 times per day to relieve pain.  Take medicines only as directed by your health care provider.  Keep all follow-up visits as directed by your health care provider. SEEK  MEDICAL CARE IF:   You notice red streaks coming from the infected area.  Your red area gets larger or turns dark in color.  Your bone or joint underneath the infected area becomes painful after the skin has healed.  Your infection returns in the same area or another area.  You notice a swollen bump in the infected area.  You develop new symptoms.  You have a fever. SEEK IMMEDIATE MEDICAL CARE IF:   You feel very sleepy.  You develop vomiting or diarrhea.  You have a general ill feeling (malaise) with muscle aches and pains.   This information is not intended to replace advice given to you by your health care provider. Make sure you discuss any questions you have with your health care provider.   Document Released: 02/26/2005 Document Revised: 02/07/2015 Document Reviewed: 08/04/2011 Elsevier Interactive Patient Education [employer].

## 2015-12-28 NOTE — Discharge Summary (Addendum)
Physician Discharge Summary  [name] [id number] DOB: April 16, 1961 DOA: 12/25/2015  PCP: Velna Hatchet, MD  Admit date: 12/25/2015 Discharge date: 12/28/2015  Admitted From: home  Disposition:  home  Recommendations for Outpatient Follow-up:  1. Follow up with Podiatry in 1 week  2. Clindamycin for total of 14-days 3. Please follow up on the following pending results:  Culture from toe swab  Home Health:  none  Equipment/Devices: post-op shoe   Discharge Condition:  Stable, improved CODE STATUS:  full  Diet recommendation:  Healthy heart   Brief/Interim Summary:  [name] a 55 y.o.malewith medical history significant of HAV deformity of the R foot. Patient presents to the ED with c/o fever and chills. Symptoms onset yesterday. Patient initially seen in UC but forgot to mention pain and possible infection of the R second toe which he noticed 2-3 days ago. Instead he mentioned a tick bite some 4-6 weeks ago, got a shot of rocephin and doxycyline for Tm of 103 at that time. After getting home his wife brought up concern for his toe and made him go to the ED.   Discharge Diagnoses:  Principal Problem:   Cellulitis of second toe of right foot Active Problems:   Cellulitis  Cellulitis with ulceration and swelling of the second toe of the right foot.  XR did not demonstrate any bone involvement or subcutaneous air.  He was started on vancomycin and seen by podiatry who performed a bedside debridement and removal of the toenail.  Culture obtained at the time grew coag negative staph.  His swelling and pain improved.  Antibiotic sensitivities are pending at the time of discharge and he was given a prescription for empiric clindamycin to continue for two weeks with close follow up with podiatry.  HIV and hemoglobin A1c are pending.  ABI were performed a month prior to admission and were normal.  Likely, the source of his infection was an abnormal/ingroin toenail.  He may use  betadine to clean the toe twice daily with dry dressing changes and should use a post-op shoe when ambulating.  If he continues to have discharge from his foot, he may not return to work.   Tick bite, unrelated.  Lyme ab was negative.  Outside of window for RMSF.    Anxiety, stable, continued duloxetine and seroquel  Consultants:   Podiatry, Celesta Gentile  Procedures:  Bedside debridement of 2nd toe right foot with toenail removal on 7/26  Antimicrobials:   Vancomycin 7/26    Discharge Instructions  Discharge Instructions    Call MD for:  difficulty breathing, headache or visual disturbances    Complete by:  As directed   Call MD for:  extreme fatigue    Complete by:  As directed   Call MD for:  hives    Complete by:  As directed   Call MD for:  persistant dizziness or light-headedness    Complete by:  As directed   Call MD for:  persistant nausea and vomiting    Complete by:  As directed   Call MD for:  redness, tenderness, or signs of infection (pain, swelling, redness, odor or green/yellow discharge around incision site)    Complete by:  As directed   Call MD for:  severe uncontrolled pain    Complete by:  As directed   Call MD for:  temperature >100.4    Complete by:  As directed   Diet - low sodium heart healthy    Complete by:  As directed   Discharge instructions    Complete by:  As directed   Please take clindamycin four times per day through 01/08/2016 to continue to treat any residual infection in your toe.  This medication can cause infectious diarrhea anytime in the next three months.  Please take probiotic such as florastor for the next three months to reduce your change of infectious diarrhea.  Infectious diarrhea is OBVIOUS:  Copious watery diarrhea oftentimes with stool incontinence and fevers.  This is different than the soft frequent stools that people often get when they take oral antibiotics.   Please use betadine swabs to clean your toe before reapplying a dry  dressing.  This will dry out your toe, but may make your dressing stick your wound.  You may use vaseline to lubricate your dressing slightly to prevent sticking if needed.  Follow up with podiatry early next week.  Wear your shoe when walking and do NOT return to work until you no longer have discharge from your toe.   Increase activity slowly    Complete by:  As directed       Medication List    TAKE these medications   acetaminophen 500 MG tablet Commonly known as:  TYLENOL Take 1,000 mg by mouth every 8 (eight) hours as needed for mild pain or moderate pain.   ALPRAZolam 1 MG tablet Commonly known as:  XANAX Take 1 mg by mouth 3 (three) times daily as needed for anxiety.   baclofen 10 MG tablet Commonly known as:  LIORESAL Take 10 mg by mouth 3 (three) times daily.   calcium citrate 950 MG tablet Commonly known as:  CALCITRATE - dosed in mg elemental calcium Take 2 tablets by mouth daily.   clindamycin 300 MG capsule Commonly known as:  CLEOCIN Take 1 capsule (300 mg total) by mouth every 6 (six) hours.   diclofenac 50 MG EC tablet Commonly known as:  VOLTAREN Take 50 mg by mouth 3 (three) times daily as needed for moderate pain.   diclofenac 75 MG EC tablet Commonly known as:  VOLTAREN Take 75 mg by mouth 2 (two) times daily as needed for moderate pain.   Diclofenac Sodium 1.5 % Soln Apply 1 application topically as needed.   DULoxetine 60 MG capsule Commonly known as:  CYMBALTA Take 60 mg by mouth every morning.   ferrous sulfate 325 (65 FE) MG tablet Take 325 mg by mouth daily with breakfast.   Fish Oil 1000 MG Caps Take 2,000 mg by mouth daily.   fluticasone 50 MCG/ACT nasal spray Commonly known as:  FLONASE Place 2 sprays into both nostrils daily as needed for allergies.   methocarbamol 500 MG tablet Commonly known as:  ROBAXIN Take 500 mg by mouth 2 (two) times daily.   multivitamin tablet Take 1 tablet by mouth daily.   povidone-iodine 10 %  swab Apply 1 application topically 2 (two) times daily.   QUEtiapine 300 MG tablet Commonly known as:  SEROQUEL Take 600 mg by mouth at bedtime.   saccharomyces boulardii 250 MG capsule Commonly known as:  FLORASTOR Take 1 capsule (250 mg total) by mouth 2 (two) times daily.      Follow-up Information    WAGONER, MATTHEW, DPM Follow up in 1 week(s).   Specialty:  Podiatry Contact information: [place] [address] [phone]          Allergies  Allergen Reactions  . Sulfa Antibiotics Itching and Rash  More severe reaction 3 years ago, caused pain.  Had taken it prior and not as bad.    Consultations: Podiatry   Procedures/Studies: Dg Chest 2 View  Result Date: 12/25/2015 CLINICAL DATA:  55 year old male with fever and cough EXAM: CHEST  2 VIEW COMPARISON:  Chest radiograph dated 07/06/2015 FINDINGS: Two views of the chest demonstrate mild emphysematous changes of the lungs with linear and platelike atelectasis/ scarring in the right mid lung field. There is no focal consolidation, pleural effusion, or pneumothorax. The cardiac silhouette is within normal limits. No acute osseous pathology identified. IMPRESSION: No active cardiopulmonary disease. Electronically Signed   By: Anner Crete M.D.   On: 12/25/2015 22:30  Dg Foot Complete Right  Result Date: 12/25/2015 CLINICAL DATA:  Right second toe redness and swelling. Fever and chills. EXAM: RIGHT FOOT COMPLETE - 3+ VIEW COMPARISON:  None. FINDINGS: No definite fracture or dislocation. No discrete areas of osteolysis with special attention paid the second digit though note, evaluation is somewhat degraded secondary to hammertoe deformity. There is mild apparent soft tissue swelling about the forefoot. No discrete areas of subcutaneous emphysema. No radiopaque foreign body. Post cancellous screw fixation of the second and fifth metatarsal heads. Stable sequela of lag screw fixation of the first  TMT joint with unchanged fracture of 1 of the cancellous screws. Persistent lucency surrounds the screws compatible with loosening. Cerclage wire again noted about the first metatarsal head. Advanced degenerative change of multiple midfoot articulations, similar to prior examinations. IMPRESSION: 1. No fracture or radiographic evidence of osteomyelitis with special attention paid to the second digit. 2. Otherwise, stable left change of the foot including hardware failure involving the fixation of the first TMT joint as detailed above. Electronically Signed   By: Sandi Mariscal M.D.   On: 12/25/2015 22:34    Subjective: Marked improvement in the swelling and pain of the right foot and second toe.  Ongoing discharge.    Discharge Exam: Vitals:   12/28/15 0618 12/28/15 1125  BP: 120/84 (!) 143/86  Pulse: 70 85  Resp: 18 18  Temp: 97.8 F (36.6 C) 98.6 F (37 C)   Vitals:   12/27/15 2055 12/28/15 0042 12/28/15 0618 12/28/15 1125  BP: 117/66 100/63 120/84 (!) 143/86  Pulse: 81 79 70 85  Resp: 18 20 18 18   Temp: 98.5 F (36.9 C) 97.8 F (36.6 C) 97.8 F (36.6 C) 98.6 F (37 C)  TempSrc: Oral Oral Oral Oral  SpO2: 98% 96% 99% 98%  Weight:   127.1 kg (280 lb 4.8 oz)   Height:        General exam:  Adult male.  No acute distress.  HEENT:  NCAT, MMM Respiratory system: Clear to auscultation bilaterally Cardiovascular system: Regular rate and rhythm, normal S1/S2. No murmurs, rubs, gallops or clicks.  Warm extremities Gastrointestinal system: Normal active bowel sounds, soft, nondistended, nontender. MSK:  Normal tone and bulk.  Send toe of right foot swollen and erythematous.  Toe nail has been removed.  Dorsum of foot is less swollen, wrinkled with areas of mild duskiness that are not well demarcated.  Warm to touch.  PIP joint dorsal surface with 23mm area of duskiness. Foul odor today.   Neuro:  Grossly intact    The results of significant diagnostics from this hospitalization  (including imaging, microbiology, ancillary and laboratory) are listed below for reference.     Microbiology: Recent Results (from the past 240 hour(s))  Culture, blood (single)     Status:  None (Preliminary result)   Collection Time: 12/25/15 10:00 PM  Result Value Ref Range Status   Specimen Description BLOOD LEFT WRIST  Final   Special Requests BOTTLES DRAWN AEROBIC AND ANAEROBIC [place]  Final   Culture   Final    NO GROWTH 1 DAY Performed at [facility]    Report Status PENDING  Incomplete  Aerobic Culture (superficial specimen)     Status: None (Preliminary result)   Collection Time: 12/26/15  7:33 PM  Result Value Ref Range Status   Specimen Description WOUND RIGHT TOE  Final   Special Requests NONE  Final   Gram Stain   Final    FEW WBC PRESENT, PREDOMINANTLY PMN RARE GRAM POSITIVE COCCI IN PAIRS    Culture RARE STAPHYLOCOCCUS SPECIES (COAGULASE NEGATIVE)  Final   Report Status PENDING  Incomplete     Labs: BNP (last 3 results) No results for input(s): BNP in the last 8760 hours. Basic Metabolic Panel:  Recent Labs Lab 12/25/15 2200 12/28/15 0548  NA 134* 142  K 3.6 4.2  CL 105 108  CO2 22 29  GLUCOSE 152* 97  BUN 11 6  CREATININE 0.78 0.64  CALCIUM 8.1* 8.7*   Liver Function Tests: No results for input(s): AST, ALT, ALKPHOS, BILITOT, PROT, ALBUMIN in the last 168 hours. No results for input(s): LIPASE, AMYLASE in the last 168 hours. No results for input(s): AMMONIA in the last 168 hours. CBC:  Recent Labs Lab 12/25/15 2200  WBC 3.5*  NEUTROABS 2.8  HGB 12.1*  HCT 36.7*  MCV 86.4  PLT 199   Cardiac Enzymes: No results for input(s): CKTOTAL, CKMB, CKMBINDEX, TROPONINI in the last 168 hours. BNP: Invalid input(s): POCBNP CBG: No results for input(s): GLUCAP in the last 168 hours. D-Dimer No results for input(s): DDIMER in the last 72 hours. Hgb A1c No results for input(s): HGBA1C in the last 72 hours. Lipid Profile No results  for input(s): CHOL, HDL, LDLCALC, TRIG, CHOLHDL, LDLDIRECT in the last 72 hours. Thyroid function studies No results for input(s): TSH, T4TOTAL, T3FREE, THYROIDAB in the last 72 hours.  Invalid input(s): FREET3 Anemia work up No results for input(s): VITAMINB12, FOLATE, FERRITIN, TIBC, IRON, RETICCTPCT in the last 72 hours. Urinalysis    Component Value Date/Time   COLORURINE YELLOW 09/07/2015 0843   APPEARANCEUR CLEAR 09/07/2015 0843   LABSPEC 1.018 09/07/2015 0843   PHURINE 5.5 09/07/2015 0843   GLUCOSEU 250 (A) 09/07/2015 0843   HGBUR NEGATIVE 09/07/2015 0843   BILIRUBINUR NEGATIVE 09/07/2015 0843   KETONESUR NEGATIVE 09/07/2015 0843   PROTEINUR NEGATIVE 09/07/2015 0843   UROBILINOGEN 0.2 09/26/2008 2042   NITRITE NEGATIVE 09/07/2015 0843   LEUKOCYTESUR NEGATIVE 09/07/2015 0843   Sepsis Labs Invalid input(s): PROCALCITONIN,  WBC,  LACTICIDVEN   Time coordinating discharge: Over 30 minutes  SIGNED:   Janece Canterbury, MD  [facility] 12/28/2015, 1:26 PM Pager   If 7PM-7AM, please contact night-coverage [email or web address] Password [email or web address]

## 2015-12-28 NOTE — Progress Notes (Signed)
Progress Note  Subjective: [name] was admitted for right 2nd toe cellulitis. He was on vancomycin and the cellulitis has improved greatly. He states "I am ready to go home and want to get out of here". He denies any systemic complaints such as fevers, chills, nausea, vomiting. He has no new concerns.   Objective:  Vitals:   12/28/15 0618 12/28/15 1125  BP: 120/84 (!) 143/86  Pulse: 70 85  Resp: 18 18  Temp: 97.8 F (36.6 C) 98.6 F (37 C)    AAO x3, NAD DP/PT pulses palpable The right second toe with continued edema, but the erythema has greatly improved. The distal portion of the toe with some epidermolysis present. There is a small pinpoint almost ulcer on the dorsal aspect of the toe at the PIPJ measuring about 0.2 x 0.2 cm. It does not probe to bone, although close. There is no fluctuance or crepitance. There is some slight discoloration to the toe, but this has improved since I last saw him. There is decreased edema to the dorsal foot and there is no warmth today and no erythema or ascending cellulitis. Hammertoe contracture present.  No pain with calf compression, swelling, warmth, erythema.   Assessment: Resolving cellulitis of 2nd to with dorsal PIPJ ulcer.   Plan: -Treatment options discussed including all alternatives, risks, and complications -Discussed with him staying in the hospital vs going home with PO abx. At this time the infection appears to be resolving and no systemic signs. He wants to go home. I believe that it is OK for him to go home today with 2 weeks of PO abx. He has a follow up already schedule for me Monday morning. Will follow up the cultures. I discussed with him that there is still potential for the infection to get into the bone and he could ultimately end up with an amputation of the toe. He understands this. I discussed with him daily dressing changes. If he goes home today and has any worsening signs to return to the ER and/or call the office. He  verbalized understanding.   I will see him Monday morning. Thank you for the consult.   Celesta Gentile, DPM Cell: [phone] Office: [phone]

## 2015-12-29 LAB — AEROBIC CULTURE W GRAM STAIN (SUPERFICIAL SPECIMEN)

## 2015-12-29 LAB — AEROBIC CULTURE  (SUPERFICIAL SPECIMEN)

## 2015-12-29 LAB — HEMOGLOBIN A1C
Hgb A1c MFr Bld: 5.6 % (ref 4.8–5.6)
MEAN PLASMA GLUCOSE: 114 mg/dL

## 2015-12-31 ENCOUNTER — Encounter: Payer: Self-pay | Admitting: Podiatry

## 2015-12-31 ENCOUNTER — Ambulatory Visit (INDEPENDENT_AMBULATORY_CARE_PROVIDER_SITE_OTHER): Payer: BLUE CROSS/BLUE SHIELD | Admitting: Podiatry

## 2015-12-31 VITALS — BP 160/96 | HR 75 | Temp 97.3°F | Resp 18

## 2015-12-31 DIAGNOSIS — L03031 Cellulitis of right toe: Secondary | ICD-10-CM | POA: Diagnosis not present

## 2015-12-31 DIAGNOSIS — L02611 Cutaneous abscess of right foot: Secondary | ICD-10-CM

## 2015-12-31 DIAGNOSIS — L97511 Non-pressure chronic ulcer of other part of right foot limited to breakdown of skin: Secondary | ICD-10-CM | POA: Diagnosis not present

## 2015-12-31 LAB — CULTURE, BLOOD (SINGLE): CULTURE: NO GROWTH

## 2015-12-31 NOTE — Patient Instructions (Signed)
Clean the toe with antibacterial soap daily. If it is draining continue with betadine dressing daily. If it is dry use a small amount of neosporin or antibiotic ointment. Monitor for any signs/symptoms of infection. Call the office immediately if any occur or go directly to the emergency room. Call with any questions/concerns.  Do not go back to work until the wound has healed.

## 2016-01-01 NOTE — Progress Notes (Signed)
Subjective: 55 year old male presents the office today for follow-up evaluation after being discharged from the hospital. He is admitted for right second toe cellulitis. He is continued on clindamycin and she was discharged in the hospital with. He has been doing iodine dressing changes to the toe daily. He has not noticed any drainage or pus. There is still some faint swelling present but there is no increase in warmth or any redness. He is continuing in the surgical shoe. Denies any systemic complaints such as fevers, chills, nausea, vomiting. No acute changes since last appointment, and no other complaints at this time.   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 There is a superficial wound present the dorsal aspect of the DIPJ. There is no probing to bone, undermining and time. This wound appears to be healing compared to what it was on Friday in the hospital. Superficial granular wound present on the medial aspect of the second toe. There is again no probing, undermining or tunneling. There is faint edema to the toe without any significant erythema or increase in warmth. They'll warmth in that edema they had to the dorsal of the foot has resolved. There is no ascending cellulitis there is no fluctuance or crepitus. There is no malodor.  No other open lesions or pre-ulcerative lesions identified at this time. There is no pain with calf compression, swelling, warmth, erythema.   Assessment: Resolving cellulitis right second toe ulceration  Plan: -Patient encouraged to call the office with any questions, concerns, change in symptoms.  -Continue clindamycin. Wound culture results were discussed with the patient.  -At this time the wounds appear to be almost healed and dried. Wouldn't change in about ointment dressing. There is been drainage to return to iodine dressing changes. -Continue surgical shoe.  -I  recommended him to stay at work until the wounds completely healed. He has a back into a steel toed shoe and I recommended against this as of the wounds completely healed.  -Monitor for signs or symptoms of worsening infection to the ER should any occur or call office.  -Follow up in 1 week or sooner if any issues are to arise. Call any questions or concerns meantime.   Celesta Gentile, DPM

## 2016-01-07 ENCOUNTER — Ambulatory Visit: Payer: BLUE CROSS/BLUE SHIELD | Admitting: Podiatry

## 2016-01-14 ENCOUNTER — Ambulatory Visit (INDEPENDENT_AMBULATORY_CARE_PROVIDER_SITE_OTHER): Payer: BLUE CROSS/BLUE SHIELD | Admitting: Podiatry

## 2016-01-14 ENCOUNTER — Encounter: Payer: Self-pay | Admitting: Podiatry

## 2016-01-14 DIAGNOSIS — L02611 Cutaneous abscess of right foot: Secondary | ICD-10-CM

## 2016-01-14 DIAGNOSIS — L03031 Cellulitis of right toe: Secondary | ICD-10-CM

## 2016-01-14 DIAGNOSIS — L97511 Non-pressure chronic ulcer of other part of right foot limited to breakdown of skin: Secondary | ICD-10-CM

## 2016-01-14 NOTE — Progress Notes (Signed)
Subjective: 55 year old male presents the office today for follow-up evaluation of right 2nd digit cellulitis. He has continued betadine over the toe daily and covering with gauze. He tried going back to work but the wound was quite not healed and was wearing a pad and it rubbed and caused a blister. Denies any swelling, redness or pain. Denies any systemic complaints such as fevers, chills, nausea, vomiting. No acute changes since last appointment, and no other complaints at this time.   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 At this time is a hyperkeratotic lesion on the dorsal as well as the distal aspect of the toe. Upon debridement there is no underlying ulceration, drainage or other signs of infection. There is no edema, erythema to the toe or increase in warmth. The wound appears to be healed and the infection appears to be resolved. No other open lesions or pre-ulcerative lesions identified at this time. There is no pain with calf compression, swelling, warmth, erythema.   Assessment: Resolving cellulitis right second toe ulceration  Plan: -Patient encouraged to call the office with any questions, concerns, change in symptoms.  -At this time the infection appears to be resolved. Hyperkeratotic lesions were debrided no underlying ulceration this time. At this time I recommended him to try going back into a regular shoe as well as wearing offloading pads or wrapping the toe with gauze to help prevent any further friction or skin breakdown. He is scheduled to return back to work next Monday however I recommended to try when his work shoes this week before going back to work. This any irritation of his shoes or any problems to call the office. Monitor daily for any skin breakdown. Monitor for any signs or symptoms of recurrent infection and directed to call the office immediately should any occur or go to the  emergency room. -States is very difficult for him to come for follow-up evaluation when she goes back to work. I encouraged to call any questions concerns or any change.  Celesta Gentile, DPM

## 2016-05-19 ENCOUNTER — Encounter: Payer: Self-pay | Admitting: Podiatry

## 2016-05-19 ENCOUNTER — Ambulatory Visit (INDEPENDENT_AMBULATORY_CARE_PROVIDER_SITE_OTHER): Payer: BLUE CROSS/BLUE SHIELD | Admitting: Podiatry

## 2016-05-19 DIAGNOSIS — M2041 Other hammer toe(s) (acquired), right foot: Secondary | ICD-10-CM

## 2016-05-19 DIAGNOSIS — L84 Corns and callosities: Secondary | ICD-10-CM | POA: Diagnosis not present

## 2016-05-19 NOTE — Progress Notes (Signed)
Subjective: 55 year old male presents the office today for concerns of thick callus to the end of the right second toe which is becoming painful. Previously we did discuss with hemiarthroplasty the DIPJ. He presents today wanting to go ahead and schedule surgery in order to help take pressure off the tip of the toe to help with pain help prevent any reoccurring ulceration or infection. He denies any drainage coming from the area and denies any swelling or redness or any pus. Denies any systemic complaints such as fevers, chills, nausea, vomiting. No acute changes since last appointment, and no other complaints at this time.   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 The distal aspect of the toes of thick hyperkeratotic lesion there is tenderness palpation of this area. Upon debrided with there is no underlying ulceration, drainage or any signs of infection around the area is pre-ulcerative. The scar from the prior surgeries well healed. There is no other areas of tenderness or any other pre-ulcer lesions identified today. 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  -Hyperkeratotic tissue is debrided without complications or bleeding. No signs of infection today. -I discussed with him both conservative and surgical treatment options. This time he wishes to proceed with surgery in order to help take pressure off the area. Discussed with him a DIPJ arthroplasty of the right second toe. I discussed with him the surgery as well as the postoperative course. Discussed that he'll stitches in place about 2-3 weeks and he'll be in a surgical shoe. He states he hasn't difficulty taking time off  work but he would like to go ahead and do this next week so he'll have time off of work in order to recover. I discussed the risks of the surgery including infection, amputation toe, need for further surgery as well as other risks on the surgical consent. He wishes to go ahead and proceed with this surgery in the office next Friday morning. -The incision placement as well as the postoperative course was discussed with the patient. I discussed risks of the surgery which include, but not limited to, infection, bleeding, pain, swelling, need for further surgery, delayed or nonhealing, painful or ugly scar, numbness or sensation changes, over/under correction, recurrence, transfer lesions, further deformity, hardware failure, DVT/PE, loss of toe/foot. Patient understands these risks and wishes to proceed with surgery. The surgical consent was reviewed with the patient all 3 pages were signed. No promises or guarantees were given to the outcome of the procedure. All questions were answered to the best of my ability. Before the surgery the patient was encouraged to call the office if there is any further questions.  -Patient encouraged to call the office with any questions, concerns, change in symptoms.   Celesta Gentile, DPM

## 2016-05-30 ENCOUNTER — Encounter: Payer: Self-pay | Admitting: Podiatry

## 2016-05-30 ENCOUNTER — Other Ambulatory Visit: Payer: Self-pay | Admitting: Podiatry

## 2016-05-30 ENCOUNTER — Ambulatory Visit (INDEPENDENT_AMBULATORY_CARE_PROVIDER_SITE_OTHER): Payer: BLUE CROSS/BLUE SHIELD

## 2016-05-30 ENCOUNTER — Ambulatory Visit (INDEPENDENT_AMBULATORY_CARE_PROVIDER_SITE_OTHER): Payer: BLUE CROSS/BLUE SHIELD | Admitting: Podiatry

## 2016-05-30 VITALS — BP 161/102 | HR 66 | Temp 97.5°F | Resp 16

## 2016-05-30 DIAGNOSIS — Z9889 Other specified postprocedural states: Principal | ICD-10-CM

## 2016-05-30 DIAGNOSIS — L97511 Non-pressure chronic ulcer of other part of right foot limited to breakdown of skin: Secondary | ICD-10-CM

## 2016-05-30 DIAGNOSIS — M2041 Other hammer toe(s) (acquired), right foot: Secondary | ICD-10-CM

## 2016-05-30 DIAGNOSIS — Z8739 Personal history of other diseases of the musculoskeletal system and connective tissue: Secondary | ICD-10-CM

## 2016-05-30 MED ORDER — HYDROCODONE-ACETAMINOPHEN 5-325 MG PO TABS: 1.0000 | ORAL_TABLET | Freq: Four times a day (QID) | ORAL | 0 refills | Status: DC | PRN

## 2016-05-30 MED ORDER — CEPHALEXIN 500 MG PO CAPS: 500.0000 mg | ORAL_CAPSULE | Freq: Three times a day (TID) | ORAL | 2 refills | Status: DC

## 2016-05-30 NOTE — Progress Notes (Signed)
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 However later he started developing mallet toe contracture at the DIPJ of the second toe. He developed a callus in this area which should results and cellulitis for which she was admitted to the hospital. He continues to get a thick pre-ulcerative lesion to the distal portion the toe which has unfortunately been causing reoccurring ulcerations. Evaluation today there is a hyperkeratotic lesion. I debrided the area prior to the procedure was a small superficial granular wound present. There is no erythema or increase in warmth to the area appears normal sterile saline was drainage was expressed. Because of this discussed with him in hopes to help prevent an amputation of the toe or further infection the DIPJ arthroplasty to help eliminate some of the pressure. Discussed with him this may not alleviate all the problems but hopefully will help minimize. All alternatives, risks, complications were discussed with the patient detail. No promises or guarantees were given as to the outcome of the procedure and all questions were answered to best of my ability.   Procedure in detail: In the treatment room the surgical site was verified by myself and nursing staff. Under sterile conditions a mixture of lidocaine plain as well as Marcaine plain was infiltrated in digital block fashion of the right second toe. Hyperkeratotic tissue is debrided to the distal portion  of the toe. He was then brought back in the surgical suite. A well-padded ankle tourniquet was applied. The right lower extremity was then scrubbed, prepped, draped in the normal sterile fashion. The foot was then exsanguinated and the tourniquet was inflated to 250 mmHg.   2 semielliptical incisions were planned along the dorsal DIPJ of the right second toe. Incision was made with a 15 with scalpel to the epidermis the dermis removing a wedge of tissue. Extensor tendon was then transected at the DIPJ followed by the medial lateral collateral ligaments exposing the head of the middle phalanx. The middle phalanx head was then excised with a bone cutting forcep. All rough edges were smoothed with a rasp. Incision was copious irrigated with sterile saline and hemostasis was achieved. Incision was then closed with 5-0 nylon. Betadine was pain over the incision followed by Xeroform and a dry sterile dressing. The tourniquet was released and there sounded been immediate capillary refill time to all the digits.  This procedure x-rays were obtained which revealed DIPJ arthroplasty. There was no evidence of acute fracture.   Follow-up in 1 week.   [name], DPM

## 2016-06-03 NOTE — Addendum Note (Signed)
Addended by: Lolita Rieger on: 06/03/2016 08:36 AM   Modules accepted: Orders

## 2016-06-05 LAB — PATHOLOGY

## 2016-06-06 ENCOUNTER — Ambulatory Visit (INDEPENDENT_AMBULATORY_CARE_PROVIDER_SITE_OTHER): Payer: BLUE CROSS/BLUE SHIELD

## 2016-06-06 ENCOUNTER — Telehealth: Payer: Self-pay | Admitting: *Deleted

## 2016-06-06 ENCOUNTER — Encounter: Payer: Self-pay | Admitting: Podiatry

## 2016-06-06 ENCOUNTER — Ambulatory Visit (INDEPENDENT_AMBULATORY_CARE_PROVIDER_SITE_OTHER): Payer: Self-pay | Admitting: Podiatry

## 2016-06-06 VITALS — BP 142/86 | HR 88 | Temp 99.1°F

## 2016-06-06 DIAGNOSIS — M2041 Other hammer toe(s) (acquired), right foot: Secondary | ICD-10-CM | POA: Diagnosis not present

## 2016-06-06 DIAGNOSIS — Z9889 Other specified postprocedural states: Secondary | ICD-10-CM

## 2016-06-06 NOTE — Telephone Encounter (Signed)
Dr. Jacqualyn Posey states pt has 2 refill of the cephalexin please instruct pt to get one refill and complete. Left message on pt's mobile phone. Left message on pt's home phone.

## 2016-06-06 NOTE — Progress Notes (Signed)
Subjective: [name] is a 56 y.o. is seen today in office s/p right 2nd DIPJ arthroplasty preformed on 05/30/16. They state their pain is minimal. He has remained in the surgical shoe. He's been taking Keflex. He's been icing and elevating as well. Denies any systemic complaints such as fevers, chills, nausea, vomiting. No calf pain, chest pain, shortness of breath.   Objective: General: No acute distress, AAOx3  DP/PT pulses palpable 2/4, CRT < 3 sec to all digits.  Protective sensation intact. Motor function intact.  Right foot: Incision is well coapted without any evidence of dehiscence and sutures are intact. There is no surrounding erythema, ascending cellulitis, fluctuance, crepitus, malodor, drainage/purulence. There is mild edema around the surgical site. There is no pain along the surgical site. The wound to the distal last of the toe appears to be healed. There is a small amount of hyperkeratotic tissue after debridement there is no drainage or pus expressed today. No other areas of tenderness to bilateral lower extremities.  No other open lesions or pre-ulcerative lesions.  No pain with calf compression, swelling, warmth, erythema.   Assessment and Plan:  Status post right 2nd DIPJ arthroplasty, doing well with no complications   -Treatment options discussed including all alternatives, risks, and complications -X-rays were obtained and reviewed with the patient. Status post DIPJ arthroplasty of the second toe. No evidence of acute fracture otherwise. -Recommended continued with antibiotic ointment and dressing to the incision. He was returned to work. Incision appears to be healing well. He can return to work as tolerated. He wears a very stiff shoe at work. Try to limit activities was possible. If he returns to workers any problems the incision or any signs of infection to hold off on going back to work. -He is going on Keflex as prophylaxis given the wound was toe. He does have feet  temperature 0.1 for which she is concerned about. We will continue antibiotics for another week. Recommended him to get his refill. -Ice/elevation -Pain medication as needed. -Monitor for any clinical signs or symptoms of infection and DVT/PE and directed to call the office immediately should any occur or go to the ER. -Follow-up in 1 week for suture removal or sooner if any problems arise. In the meantime, encouraged to call the office with any questions, concerns, change in symptoms.   *Pathology results were discussed the patient which revealed benign bone. Negative for acute inflammation and osteomyelitis. Negative for malignancy.   Celesta Gentile, DPM

## 2016-06-09 ENCOUNTER — Ambulatory Visit: Payer: BLUE CROSS/BLUE SHIELD | Admitting: Podiatry

## 2016-06-13 ENCOUNTER — Encounter: Payer: BLUE CROSS/BLUE SHIELD | Admitting: Podiatry

## 2016-06-13 ENCOUNTER — Ambulatory Visit: Payer: BLUE CROSS/BLUE SHIELD

## 2016-06-13 ENCOUNTER — Ambulatory Visit (INDEPENDENT_AMBULATORY_CARE_PROVIDER_SITE_OTHER): Payer: BLUE CROSS/BLUE SHIELD | Admitting: Podiatry

## 2016-06-13 VITALS — Temp 99.6°F

## 2016-06-13 DIAGNOSIS — Z9889 Other specified postprocedural states: Secondary | ICD-10-CM

## 2016-06-13 DIAGNOSIS — M2041 Other hammer toe(s) (acquired), right foot: Secondary | ICD-10-CM

## 2016-06-19 NOTE — Progress Notes (Signed)
Subjective: [name] is a 56 y.o. is seen today in office s/p right 2nd DIPJ arthroplasty preformed on 05/30/16. He has not had any pain since last appointment and he is very appreciative of the surgery. He presents today for suture removal.  Denies any systemic complaints such as fevers, chills, nausea, vomiting. No calf pain, chest pain, shortness of breath.   Objective: General: No acute distress, AAOx3  DP/PT pulses palpable 2/4, CRT < 3 sec to all digits.  Protective sensation intact. Motor function intact.  Right foot: Incision is well coapted without any evidence of dehiscence and sutures are intact. There is no surrounding erythema, ascending cellulitis, fluctuance, crepitus, malodor, drainage/purulence. There is mild edema around the surgical site although this has improved some. There is no pain along the surgical site. The wound to the distal last of the toe appears to be healed. There is minimal amount of hyperkeratotic tissue after debridement there is no drainage or pus expressed today. No other areas of tenderness to bilateral lower extremities.  No other open lesions or pre-ulcerative lesions.  No pain with calf compression, swelling, warmth, erythema.   Assessment and Plan:  Status post right 2nd DIPJ arthroplasty, doing well with no complications   -Treatment options discussed including all alternatives, risks, and complications -Sutures removed today. Antibiotic ointment and a bandage was applied. Continue with this as well. -He's been wearing regular shoe and continue with this however monitor closely for any skin breakdown. -Finish course of antibiotics. There are no clinical signs of infection today however. -Follow-up as scheduled or sooner if needed. Call any questions or concerns meantime.  Celesta Gentile, DPM

## 2016-06-30 NOTE — Progress Notes (Signed)
DOS 12.29.2017 (office surgery) Right 2nd digit hammertoe repair to help prevent ulcer and callus from worsening.

## 2016-07-11 ENCOUNTER — Ambulatory Visit: Payer: BLUE CROSS/BLUE SHIELD | Admitting: Podiatry

## 2016-07-14 ENCOUNTER — Ambulatory Visit (INDEPENDENT_AMBULATORY_CARE_PROVIDER_SITE_OTHER): Payer: Self-pay | Admitting: Podiatry

## 2016-07-14 ENCOUNTER — Ambulatory Visit (INDEPENDENT_AMBULATORY_CARE_PROVIDER_SITE_OTHER): Payer: BLUE CROSS/BLUE SHIELD

## 2016-07-14 DIAGNOSIS — M2041 Other hammer toe(s) (acquired), right foot: Secondary | ICD-10-CM

## 2016-07-14 DIAGNOSIS — Z9889 Other specified postprocedural states: Secondary | ICD-10-CM

## 2016-07-16 NOTE — Progress Notes (Signed)
Subjective: [name] is a 56 y.o. is seen today in office s/p right 2nd DIPJ arthroplasty preformed on 05/30/16. He states that the callus/wound has resolved. He wish the toe would sit a little straighter however and he is asking if there is anything that can be done to help brace it.  Denies any systemic complaints such as fevers, chills, nausea, vomiting. No calf pain, chest pain, shortness of breath.   Objective: General: No acute distress, AAOx3  DP/PT pulses palpable 2/4, CRT < 3 sec to all digits.  Protective sensation intact. Motor function intact.  Right foot: Incision is well coapted without any evidence of dehiscence and a scar has formed. There does appear to be mild irritation along the dorsal DIPJ. There is no ulcer or skin break down to the distal portion of the toe. There has been chronic edema to the toe, even before the surgery and it appears unchanged. No ascending cellulitis. No drainage. The toe does sit in a contracted position still at the DIPJ.  No other open lesions or pre-ulcerative lesions.  No pain with calf compression, swelling, warmth, erythema.   Assessment and Plan:  Status post right 2nd DIPJ arthroplasty,   -Treatment options discussed including all alternatives, risks, and complications -X-rays were obtained and reviewed with the patient. No evidence of acute osteomyelitis. Contracture of the DIPJ.  -I dispensed various offloading pads to help hold the 2nd toe in a rectus position.  -Monitor for any skin breakdown or reoccurrence  -Follow-up as scheduled or sooner if needed. Call any questions or concerns meantime.  Celesta Gentile, DPM

## 2016-09-03 ENCOUNTER — Ambulatory Visit (INDEPENDENT_AMBULATORY_CARE_PROVIDER_SITE_OTHER): Payer: BLUE CROSS/BLUE SHIELD | Admitting: Physician Assistant

## 2016-09-03 VITALS — BP 106/71 | HR 82 | Wt 265.0 lb

## 2016-09-03 DIAGNOSIS — E66811 Obesity, class 1: Secondary | ICD-10-CM

## 2016-09-03 DIAGNOSIS — Z8042 Family history of malignant neoplasm of prostate: Secondary | ICD-10-CM

## 2016-09-03 DIAGNOSIS — R972 Elevated prostate specific antigen [PSA]: Secondary | ICD-10-CM

## 2016-09-03 DIAGNOSIS — N401 Enlarged prostate with lower urinary tract symptoms: Secondary | ICD-10-CM

## 2016-09-03 DIAGNOSIS — R351 Nocturia: Secondary | ICD-10-CM

## 2016-09-03 DIAGNOSIS — E6609 Other obesity due to excess calories: Secondary | ICD-10-CM

## 2016-09-03 DIAGNOSIS — Z0000 Encounter for general adult medical examination without abnormal findings: Secondary | ICD-10-CM

## 2016-09-03 DIAGNOSIS — Z6833 Body mass index (BMI) 33.0-33.9, adult: Secondary | ICD-10-CM

## 2016-09-03 DIAGNOSIS — R600 Localized edema: Secondary | ICD-10-CM | POA: Insufficient documentation

## 2016-09-03 MED ORDER — TAMSULOSIN HCL 0.4 MG PO CAPS: 0.4000 mg | ORAL_CAPSULE | Freq: Every day | ORAL | 6 refills | Status: DC

## 2016-09-03 NOTE — Patient Instructions (Signed)
I have also ordered fasting labs. The lab is a walk-in open M-F 8a-5p (closed 12:30-1:30p). Nothing to eat or drink after midnight or at least 8 hours before your blood draw. You can have water and your medications.    Health Maintenance, Male A healthy lifestyle and preventive care is important for your health and wellness. Ask your health care provider about what schedule of regular examinations is right for you. What should I know about weight and diet?  Eat a Healthy Diet  Eat plenty of vegetables, fruits, whole grains, low-fat dairy products, and lean protein.  Do not eat a lot of foods high in solid fats, added sugars, or salt. Maintain a Healthy Weight  Regular exercise can help you achieve or maintain a healthy weight. You should:  Do at least 150 minutes of exercise each week. The exercise should increase your heart rate and make you sweat (moderate-intensity exercise).  Do strength-training exercises at least twice a week. Watch Your Levels of Cholesterol and Blood Lipids  Have your blood tested for lipids and cholesterol every 5 years starting at 57 years of age. If you are at high risk for heart disease, you should start having your blood tested when you are 57 years old. You may need to have your cholesterol levels checked more often if:  Your lipid or cholesterol levels are high.  You are older than 57 years of age.  You are at high risk for heart disease. What should I know about cancer screening? Many types of cancers can be detected early and may often be prevented. Lung Cancer  You should be screened every year for lung cancer if:  You are a current smoker who has smoked for at least 30 years.  You are a former smoker who has quit within the past 15 years.  Talk to your health care provider about your screening options, when you should start screening, and how often you should be screened. Colorectal Cancer  Routine colorectal cancer screening usually begins  at 57 years of age and should be repeated every 5-10 years until you are 57 years old. You may need to be screened more often if early forms of precancerous polyps or small growths are found. Your health care provider may recommend screening at an earlier age if you have risk factors for colon cancer.  Your health care provider may recommend using home test kits to check for hidden blood in the stool.  A small camera at the end of a tube can be used to examine your colon (sigmoidoscopy or colonoscopy). This checks for the earliest forms of colorectal cancer. Prostate and Testicular Cancer  Depending on your age and overall health, your health care provider may do certain tests to screen for prostate and testicular cancer.  Talk to your health care provider about any symptoms or concerns you have about testicular or prostate cancer. Skin Cancer  Check your skin from head to toe regularly.  Tell your health care provider about any new moles or changes in moles, especially if:  There is a change in a mole's size, shape, or color.  You have a mole that is larger than a pencil eraser.  Always use sunscreen. Apply sunscreen liberally and repeat throughout the day.  Protect yourself by wearing long sleeves, pants, a wide-brimmed hat, and sunglasses when outside. What should I know about heart disease, diabetes, and high blood pressure?  If you are 45-73 years of age, have your blood pressure checked every  3-5 years. If you are 66 years of age or older, have your blood pressure checked every year. You should have your blood pressure measured twice-once when you are at a hospital or clinic, and once when you are not at a hospital or clinic. Record the average of the two measurements. To check your blood pressure when you are not at a hospital or clinic, you can use:  An automated blood pressure machine at a pharmacy.  A home blood pressure monitor.  Talk to your health care provider about your  target blood pressure.  If you are between 28-32 years old, ask your health care provider if you should take aspirin to prevent heart disease.  Have regular diabetes screenings by checking your fasting blood sugar level.  If you are at a normal weight and have a low risk for diabetes, have this test once every three years after the age of 66.  If you are overweight and have a high risk for diabetes, consider being tested at a younger age or more often.  A one-time screening for abdominal aortic aneurysm (AAA) by ultrasound is recommended for men aged 19-75 years who are current or former smokers. What should I know about preventing infection? Hepatitis B  If you have a higher risk for hepatitis B, you should be screened for this virus. Talk with your health care provider to find out if you are at risk for hepatitis B infection. Hepatitis C  Blood testing is recommended for:  Everyone born from [birth year] through 1966.  Anyone with known risk factors for hepatitis C. Sexually Transmitted Diseases (STDs)  You should be screened each year for STDs including gonorrhea and chlamydia if:  You are sexually active and are younger than 57 years of age.  You are older than 57 years of age and your health care provider tells you that you are at risk for this type of infection.  Your sexual activity has changed since you were last screened and you are at an increased risk for chlamydia or gonorrhea. Ask your health care provider if you are at risk.  Talk with your health care provider about whether you are at high risk of being infected with HIV. Your health care provider may recommend a prescription medicine to help prevent HIV infection. What else can I do?  Schedule regular health, dental, and eye exams.  Stay current with your vaccines (immunizations).  Do not use any tobacco products, such as cigarettes, chewing tobacco, and e-cigarettes. If you need help quitting, ask your health care  provider.  Limit alcohol intake to no more than 2 drinks per day. One drink equals 12 ounces of beer, 5 ounces of wine, or 1 ounces of hard liquor.  Do not use street drugs.  Do not share needles.  Ask your health care provider for help if you need support or information about quitting drugs.  Tell your health care provider if you often feel depressed.  Tell your health care provider if you have ever been abused or do not feel safe at home. This information is not intended to replace advice given to you by your health care provider. Make sure you discuss any questions you have with your health care provider. Document Released: 11/15/2007 Document Revised: 01/16/2016 Document Reviewed: 02/20/2015 Elsevier Interactive Patient Education  2017 [employer].
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
## 2016-09-07 ENCOUNTER — Encounter: Payer: Self-pay | Admitting: Physician Assistant

## 2016-09-07 DIAGNOSIS — Z6833 Body mass index (BMI) 33.0-33.9, adult: Secondary | ICD-10-CM

## 2016-09-07 DIAGNOSIS — Z86018 Personal history of other benign neoplasm: Secondary | ICD-10-CM | POA: Insufficient documentation

## 2016-09-07 DIAGNOSIS — Z8709 Personal history of other diseases of the respiratory system: Secondary | ICD-10-CM | POA: Insufficient documentation

## 2016-09-07 DIAGNOSIS — E6609 Other obesity due to excess calories: Secondary | ICD-10-CM | POA: Insufficient documentation

## 2016-10-25 ENCOUNTER — Emergency Department (HOSPITAL_BASED_OUTPATIENT_CLINIC_OR_DEPARTMENT_OTHER)
Admission: EM | Admit: 2016-10-25 | Discharge: 2016-10-25 | Disposition: A | Discharge status: Home / Self Care | Payer: BLUE CROSS/BLUE SHIELD | Attending: Emergency Medicine | Admitting: Emergency Medicine

## 2016-10-25 ENCOUNTER — Encounter (HOSPITAL_BASED_OUTPATIENT_CLINIC_OR_DEPARTMENT_OTHER): Payer: Self-pay | Admitting: Emergency Medicine

## 2016-10-25 DIAGNOSIS — M25572 Pain in left ankle and joints of left foot: Secondary | ICD-10-CM | POA: Diagnosis present

## 2016-10-25 DIAGNOSIS — Z79899 Other long term (current) drug therapy: Secondary | ICD-10-CM | POA: Insufficient documentation

## 2016-10-25 DIAGNOSIS — F17210 Nicotine dependence, cigarettes, uncomplicated: Secondary | ICD-10-CM | POA: Insufficient documentation

## 2016-10-25 DIAGNOSIS — L247 Irritant contact dermatitis due to plants, except food: Secondary | ICD-10-CM | POA: Diagnosis not present

## 2016-10-25 DIAGNOSIS — L03116 Cellulitis of left lower limb: Secondary | ICD-10-CM | POA: Diagnosis not present

## 2016-10-25 DIAGNOSIS — Z96653 Presence of artificial knee joint, bilateral: Secondary | ICD-10-CM | POA: Diagnosis not present

## 2016-10-25 DIAGNOSIS — L03119 Cellulitis of unspecified part of limb: Secondary | ICD-10-CM

## 2016-10-25 MED ORDER — CEPHALEXIN 500 MG PO CAPS: 500.0000 mg | ORAL_CAPSULE | Freq: Two times a day (BID) | ORAL | 0 refills | Status: DC
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
## 2016-10-25 NOTE — ED Triage Notes (Signed)
Redness, pain and swelling to L ankle since Tuesday. Sent from [facility].

## 2016-10-25 NOTE — Discharge Instructions (Signed)
You can place hydrocortisone on the area of rash twice a day and use Benadryl every 6 hours as needed

## 2016-11-07 ENCOUNTER — Encounter: Payer: Self-pay | Admitting: Family Medicine

## 2016-11-07 ENCOUNTER — Ambulatory Visit (INDEPENDENT_AMBULATORY_CARE_PROVIDER_SITE_OTHER): Payer: BLUE CROSS/BLUE SHIELD | Admitting: Family Medicine

## 2016-11-07 VITALS — BP 152/98 | HR 96 | Temp 98.3°F | Wt 262.0 lb

## 2016-11-07 DIAGNOSIS — L97921 Non-pressure chronic ulcer of unspecified part of left lower leg limited to breakdown of skin: Secondary | ICD-10-CM

## 2016-11-07 LAB — CBC
HCT: 41.9 % (ref 38.5–50.0)
Hemoglobin: 14 g/dL (ref 13.2–17.1)
MCH: 31.3 pg (ref 27.0–33.0)
MCHC: 33.4 g/dL (ref 32.0–36.0)
MCV: 93.7 fL (ref 80.0–100.0)
MPV: 9.2 fL (ref 7.5–12.5)
PLATELETS: 274 10*3/uL (ref 140–400)
RBC: 4.47 MIL/uL (ref 4.20–5.80)
RDW: 14.2 % (ref 11.0–15.0)
WBC: 7 10*3/uL (ref 3.8–10.8)

## 2016-11-07 MED ORDER — DOXYCYCLINE HYCLATE 100 MG PO TABS: 100.0000 mg | ORAL_TABLET | Freq: Two times a day (BID) | ORAL | 0 refills | Status: DC
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
## 2016-11-07 NOTE — Patient Instructions (Addendum)
Thank you for coming in today. Start wet to dry dressing until you see Granulation Tissue.  Once you start seeing good granulation tissue you can switch to ointment. Vasolin is just fine.  Start Doxycycline and continue Keflex.   Recheck as needed if not better.

## 2016-11-08 LAB — COMPLETE METABOLIC PANEL WITH GFR
ALBUMIN: 4 g/dL (ref 3.6–5.1)
ALK PHOS: 82 U/L (ref 40–115)
ALT: 25 U/L (ref 9–46)
AST: 24 U/L (ref 10–35)
BILIRUBIN TOTAL: 0.4 mg/dL (ref 0.2–1.2)
BUN: 10 mg/dL (ref 7–25)
CO2: 25 mmol/L (ref 20–31)
CREATININE: 0.73 mg/dL (ref 0.70–1.33)
Calcium: 9.1 mg/dL (ref 8.6–10.3)
Chloride: 110 mmol/L (ref 98–110)
GFR, Est African American: >89 mL/min (ref >=60)
GFR, Est Non African American: >89 mL/min (ref >=60)
Glucose, Bld: 102 mg/dL — ABNORMAL HIGH (ref 65–99)
Potassium: 4.2 mmol/L (ref 3.5–5.3)
SODIUM: 141 mmol/L (ref 135–146)
TOTAL PROTEIN: 6.5 g/dL (ref 6.1–8.1)

## 2016-11-08 LAB — LIPID PANEL W/REFLEX DIRECT LDL
CHOLESTEROL: 189 mg/dL (ref <200)
HDL: 85 mg/dL (ref >40)
LDL-Cholesterol: 88 mg/dL
Non-HDL Cholesterol (Calc): 104 mg/dL (ref <130)
Total CHOL/HDL Ratio: 2.2 Ratio (ref <5.0)
Triglycerides: 71 mg/dL (ref <150)

## 2016-11-08 LAB — URINALYSIS
BILIRUBIN URINE: NEGATIVE
GLUCOSE, UA: NEGATIVE
Hgb urine dipstick: NEGATIVE
KETONES UR: NEGATIVE
Leukocytes, UA: NEGATIVE
Nitrite: NEGATIVE
PH: 6.5 (ref 5.0–8.0)
Protein, ur: NEGATIVE
Specific Gravity, Urine: 1.01 (ref 1.001–1.035)

## 2016-11-10 LAB — PSA, TOTAL AND FREE
PSA, % FREE: 20 % — AB (ref >25)
PSA, FREE: 0.2 ng/mL
PSA, Total: 1 ng/mL (ref <=4.0)

## 2016-11-11 ENCOUNTER — Encounter: Payer: Self-pay | Admitting: Physician Assistant

## 2016-11-11 DIAGNOSIS — Z8042 Family history of malignant neoplasm of prostate: Secondary | ICD-10-CM

## 2016-11-11 DIAGNOSIS — R972 Elevated prostate specific antigen [PSA]: Secondary | ICD-10-CM | POA: Insufficient documentation

## 2016-11-11 HISTORY — DX: Family history of malignant neoplasm of prostate: Z80.42

## 2016-11-11 NOTE — Progress Notes (Signed)
Hi Ermon,  Your labs look good - normal kidney function - cholesterol in a healthy range - normal blood counts - no evidence of diabetes   One of your prostate tests was abnormal. This can represent an increased risk of prostate cancer. Given your family history, I am going to refer you to a urologist.

## 2016-11-11 NOTE — Addendum Note (Signed)
Addended by: Nelson Chimes E on: 11/11/2016 01:15 PM   Modules accepted: Orders

## 2016-11-19 ENCOUNTER — Encounter: Payer: Self-pay | Admitting: Physician Assistant

## 2016-11-19 DIAGNOSIS — K529 Noninfective gastroenteritis and colitis, unspecified: Secondary | ICD-10-CM

## 2016-11-19 DIAGNOSIS — K5730 Diverticulosis of large intestine without perforation or abscess without bleeding: Secondary | ICD-10-CM | POA: Insufficient documentation

## 2016-11-19 HISTORY — DX: Noninfective gastroenteritis and colitis, unspecified: K52.9

## 2016-11-19 HISTORY — DX: Diverticulosis of large intestine without perforation or abscess without bleeding: K57.30

## 2016-11-21 ENCOUNTER — Encounter: Payer: Self-pay | Admitting: Physician Assistant

## 2016-12-04 ENCOUNTER — Ambulatory Visit (INDEPENDENT_AMBULATORY_CARE_PROVIDER_SITE_OTHER): Payer: BLUE CROSS/BLUE SHIELD | Admitting: Family Medicine

## 2016-12-04 VITALS — BP 133/76 | HR 91 | Temp 98.6°F | Wt 257.0 lb

## 2016-12-04 DIAGNOSIS — M25572 Pain in left ankle and joints of left foot: Secondary | ICD-10-CM

## 2016-12-04 DIAGNOSIS — G8929 Other chronic pain: Secondary | ICD-10-CM | POA: Diagnosis not present

## 2016-12-04 DIAGNOSIS — L97311 Non-pressure chronic ulcer of right ankle limited to breakdown of skin: Secondary | ICD-10-CM

## 2016-12-04 MED ORDER — TRAMADOL HCL 50 MG PO TABS: 50.0000 mg | ORAL_TABLET | Freq: Four times a day (QID) | ORAL | 0 refills | Status: DC | PRN

## 2016-12-04 MED ORDER — GABAPENTIN 300 MG PO CAPS: ORAL_CAPSULE | ORAL | 3 refills | Status: DC

## 2016-12-04 NOTE — Patient Instructions (Signed)
Thank you for coming in today. You should hear from wound care soon.  We may consider hyperbaric oxygen.   Take gabapentin at night for pain.  Use tramadol sparingly.  Recheck with Charley in 2-4 weeks.

## 2016-12-05 DIAGNOSIS — L97309 Non-pressure chronic ulcer of unspecified ankle with unspecified severity: Secondary | ICD-10-CM | POA: Insufficient documentation
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
## 2016-12-22 ENCOUNTER — Telehealth: Payer: Self-pay | Admitting: Physician Assistant

## 2016-12-22 ENCOUNTER — Other Ambulatory Visit: Payer: Self-pay | Admitting: Family Medicine

## 2016-12-22 DIAGNOSIS — L97311 Non-pressure chronic ulcer of right ankle limited to breakdown of skin: Secondary | ICD-10-CM

## 2016-12-22 NOTE — Telephone Encounter (Signed)
Dr C: Pt called today to follow up on Wound Care referral but there is no indication that a referral has been put.  Thank you.

## 2016-12-25 ENCOUNTER — Ambulatory Visit (INDEPENDENT_AMBULATORY_CARE_PROVIDER_SITE_OTHER): Payer: BLUE CROSS/BLUE SHIELD | Admitting: Sports Medicine

## 2016-12-25 ENCOUNTER — Encounter: Payer: Self-pay | Admitting: Physician Assistant

## 2016-12-25 VITALS — BP 107/72 | HR 82 | Temp 98.4°F | Ht 75.5 in | Wt 253.6 lb

## 2016-12-25 DIAGNOSIS — F17210 Nicotine dependence, cigarettes, uncomplicated: Secondary | ICD-10-CM

## 2016-12-25 DIAGNOSIS — L97921 Non-pressure chronic ulcer of unspecified part of left lower leg limited to breakdown of skin: Secondary | ICD-10-CM | POA: Diagnosis not present

## 2016-12-25 MED ORDER — DUODERM CGF DRESSING EX MISC: 1.0000 | Freq: Every day | CUTANEOUS | 1 refills | Status: DC

## 2016-12-25 NOTE — Assessment & Plan Note (Signed)
There are typically several reasons that keeping him from healing, one is inadequate vascular supply, #2 is infection, #3 is constant trauma, #4 is superimposed squamous cell carcinoma. 1. We will work on having him stop smoking. 2. No signs of bacterial infection, he has been on Keflex and doxycycline without any improvement. 3. He admits to constant abrasion in his work boots, duoderm will allow some protection against abrasion. 4. If insufficient improvement after a month he would be a candidate for biopsy.

## 2016-12-25 NOTE — Progress Notes (Signed)
  Subjective:    CC: Skin ulcer  HPI: For sometime now this pleasant 56 year old male has had a nonhealing ulcer on the inside of his left ankle. He is a smoker, nondiabetic. It's minimally painful. Kathrynn Speed PA-C asked me to see this patient with her.  No constitutional symptoms.  Past medical history:  Negative.  See flowsheet/record as well for more information.  Surgical history: Negative.  See flowsheet/record as well for more information.  Family history: Negative.  See flowsheet/record as well for more information.  Social history: Negative.  See flowsheet/record as well for more information.  Allergies, and medications have been entered into the medical record, reviewed, and no changes needed.   Review of Systems: No fevers, chills, night sweats, weight loss, chest pain, or shortness of breath.   Objective:    General: Well Developed, well nourished, and in no acute distress.  Neuro: Alert and oriented x3, extra-ocular muscles intact, sensation grossly intact.  HEENT: Normocephalic, atraumatic, pupils equal round reactive to light, neck supple, no masses, no lymphadenopathy, thyroid nonpalpable.  Skin: Warm and dry, no rashes. Cardiac: Regular rate and rhythm, no murmurs rubs or gallops, no lower extremity edema.  Respiratory: Clear to auscultation bilaterally. Not using accessory muscles, speaking in full sentences. Left ankle: There is a 3-4 cm ulcer that looks to go down to the dermis but not to the subcutaneous tissues, no signs of bacterial superinfection.  This was cleaned and debrided, Duoderm was applied.  Impression and Recommendations:    Nonhealing ulcer of left lower extremity limited to breakdown of skin ([place]) There are typically several reasons that keeping him from healing, one is inadequate vascular supply, #2 is infection, #3 is constant trauma, #4 is superimposed squamous cell carcinoma. 1. We will work on having him stop smoking. 2. No signs of  bacterial infection, he has been on Keflex and doxycycline without any improvement. 3. He admits to constant abrasion in his work boots, duoderm will allow some protection against abrasion. 4. If insufficient improvement after a month he would be a candidate for biopsy.  I spent 25 minutes with this patient, greater than 50% was face-to-face time counseling regarding the above diagnoses
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
## 2016-12-25 NOTE — Patient Instructions (Signed)
Clean with peroxide gently Cover with DuoDerm dressing Return in 1 week for a nurse visit wound check   How to [place] dressing is a material that is placed in and over wounds. A dressing helps your wound to heal by protecting it from bacteria, further injury, and becoming too dry or too wet. What are the risks? The adhesive tape that is used with a dressing may make your skin sore or irritated or cause a rash. These are the most common problems. However, more serious problems can develop, such as:  Bleeding.  Infection.  How to change your dressing How often you change your dressing will depend on your wound. Change the dressing as often as told by your health care provider. Preparing to Change Your Dressing  Take a shower before you do the first dressing change of the day. If your health care provider does not want your wound to get wet and your dressing is not waterproof, you may need to apply plastic leak-proof sealing wrap to your dressing for protection.  If needed, take pain medicine 30 minutes before the dressing change as prescribed by your health care provider.  Set up a clean station for wound care. You will need: ? A disposable garbage bag that is open and ready to use. ? Hand sanitizer. ? Wound cleanser or salt-water solution (saline) as told by your health care provider. ? New dressing material or bandages. Make sure to open the dressing package so the dressing remains on the inside of the package. You may also need the following in your clean station:  A box of vinyl gloves.  Tape.  Skin protectant. This may be a wipe, film, or spray.  Clean or germ-free (sterile) scissors.  A cotton-tipped applicator.  Removing Your Old Dressing  Wash your hands with soap and water. Dry your hands with a clean towel. If soap and water are not available, use hand sanitizer.  If you are using gloves, put the gloves on before you remove the dressing.  Gently  remove any adhesive or tape by pulling it off in the direction of your hair growth. Only touch the outside edges of the dressing.  Take off the dressing. If the dressing sticks to your skin, use a sterile salt-water solution to wet the dressing. This helps it to come off more easily.  Remove any gauze or packing in your wound.  Throw the old dressing supplies into the ready garbage bag.  Remove each glove by grabbing the cuff with the opposite hand and turning the glove inside out. Place the gloves in the trash immediately.  Wash your hands with soap and water. Dry your hands with a clean towel. If soap and water are not available, use hand sanitizer. Cleaning Your Wound  Follow instructions from your health care provider about how to clean your wound. This may include using a saline or recommended wound cleanser.  Do not use over-the-counter medicated or antiseptic creams, sprays, liquids, or dressings unless told to do so by your health care provider.  Use a clean gauze pad to clean the area thoroughly with the recommended saline solution or wound cleanser.  Throw the gauze pad into the garbage bag.  Wash your hands with soap and water. Dry your hands with a clean towel. If soap and water are not available, use hand sanitizer. Applying the Dressing  If your health care provider recommended a skin protectant, apply it to the skin around the wound.  Cover  the wound with the recommended dressing, such as a nonstick gauze or bandage. Make sure to touch only the outside edges of the dressing. Do not touch the inside of the dressing.  Secure the dressing so all sides stay in place. You may do this with the attached medical adhesive, roll gauze, or tape. If you use tape, do not wrap the tape all the way around your arm or leg.  Take off your gloves. Put them in the plastic bag with the old dressing. Tie the bag shut and throw it away.  Wash your hands with soap and water. Dry your hands  with a clean towel. If soap and water are not available, use hand sanitizer. Contact a health care provider if:   You have new pain.  You develop irritation, a rash, or itching around the wound or dressing.  Changing your dressing causes pain or a lot of bleeding. Get help right away if:  You have severe pain.  You have signs of infection, such as: ? More redness, swelling, or pain. ? More fluid or blood. ? Warmth. ? Pus or a bad smell. ? Red streaks leading from wound. ? A fever. This information is not intended to replace advice given to you by your health care provider. Make sure you discuss any questions you have with your health care provider. Document Released: 06/26/2004 Document Revised: 10/17/2015 Document Reviewed: 02/22/2015 Elsevier Interactive Patient Education  [employer].

## 2017-01-02 ENCOUNTER — Encounter: Payer: Self-pay | Admitting: Physician Assistant

## 2017-01-24 ENCOUNTER — Encounter: Payer: Self-pay | Admitting: Emergency Medicine

## 2017-01-24 ENCOUNTER — Emergency Department (INDEPENDENT_AMBULATORY_CARE_PROVIDER_SITE_OTHER): Payer: Worker's Compensation

## 2017-01-24 ENCOUNTER — Emergency Department (INDEPENDENT_AMBULATORY_CARE_PROVIDER_SITE_OTHER)
Admission: EM | Admit: 2017-01-24 | Discharge: 2017-01-24 | Disposition: A | Discharge status: Home / Self Care | Payer: Worker's Compensation | Source: Home / Self Care | Attending: Family Medicine | Admitting: Family Medicine

## 2017-01-24 DIAGNOSIS — Z572 Occupational exposure to dust: Secondary | ICD-10-CM

## 2017-01-24 DIAGNOSIS — Z8701 Personal history of pneumonia (recurrent): Secondary | ICD-10-CM | POA: Diagnosis not present
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
## 2017-01-24 NOTE — Discharge Instructions (Signed)
Continue albuterol inhaler 2 to 3 times daily. Take plain guaifenesin (1200mg  extended release tabs such as Mucinex) twice daily, with plenty of water, for cough and congestion.

## 2017-04-14 ENCOUNTER — Encounter (HOSPITAL_BASED_OUTPATIENT_CLINIC_OR_DEPARTMENT_OTHER): Payer: Self-pay

## 2017-04-14 ENCOUNTER — Other Ambulatory Visit: Payer: Self-pay

## 2017-04-14 ENCOUNTER — Emergency Department (HOSPITAL_BASED_OUTPATIENT_CLINIC_OR_DEPARTMENT_OTHER)
Admission: EM | Admit: 2017-04-14 | Discharge: 2017-04-14 | Disposition: A | Discharge status: Home / Self Care | Payer: No Typology Code available for payment source | Attending: Physician Assistant | Admitting: Physician Assistant

## 2017-04-14 DIAGNOSIS — Y999 Unspecified external cause status: Secondary | ICD-10-CM | POA: Insufficient documentation

## 2017-04-14 DIAGNOSIS — Z87891 Personal history of nicotine dependence: Secondary | ICD-10-CM | POA: Insufficient documentation

## 2017-04-14 DIAGNOSIS — Y929 Unspecified place or not applicable: Secondary | ICD-10-CM | POA: Insufficient documentation

## 2017-04-14 DIAGNOSIS — Z79899 Other long term (current) drug therapy: Secondary | ICD-10-CM | POA: Insufficient documentation

## 2017-04-14 DIAGNOSIS — Y939 Activity, unspecified: Secondary | ICD-10-CM | POA: Insufficient documentation

## 2017-04-14 DIAGNOSIS — M542 Cervicalgia: Secondary | ICD-10-CM

## 2017-04-14 DIAGNOSIS — Z96653 Presence of artificial knee joint, bilateral: Secondary | ICD-10-CM | POA: Insufficient documentation

## 2017-04-14 MED ORDER — IBUPROFEN 200 MG PO TABS
600.0000 mg | ORAL_TABLET | Freq: Once | ORAL | Status: AC
  Administered 2017-04-14: 600 mg via ORAL
  Filled 2017-04-14: qty 1

## 2017-04-14 MED ORDER — CYCLOBENZAPRINE HCL 10 MG PO TABS: 10.0000 mg | ORAL_TABLET | Freq: Two times a day (BID) | ORAL | 0 refills | Status: DC | PRN
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
## 2017-04-14 NOTE — Discharge Instructions (Signed)
The pain your experiencing is likely due to muscle strain, you may take Ibuprofen and Flexeril as needed for pain management. Do not combine with any pain reliever other than tylenol. The muscle soreness should improve over the next week. Follow up with your family doctor in the next week for a recheck if you are still having symptoms. Return to ED if pain is worsening, you develop weakness or numbness of extremities, or new or concerning symptoms develop.

## 2017-05-28 DIAGNOSIS — J189 Pneumonia, unspecified organism: Secondary | ICD-10-CM | POA: Diagnosis not present

## 2017-05-28 DIAGNOSIS — H6123 Impacted cerumen, bilateral: Secondary | ICD-10-CM | POA: Diagnosis not present

## 2017-06-10 ENCOUNTER — Ambulatory Visit (INDEPENDENT_AMBULATORY_CARE_PROVIDER_SITE_OTHER): Payer: 59 | Admitting: Physician Assistant

## 2017-06-10 ENCOUNTER — Other Ambulatory Visit: Payer: Self-pay

## 2017-06-10 VITALS — BP 122/85 | HR 75 | Temp 97.7°F | Wt 249.0 lb

## 2017-06-10 DIAGNOSIS — L03116 Cellulitis of left lower limb: Secondary | ICD-10-CM | POA: Insufficient documentation

## 2017-06-10 DIAGNOSIS — L97921 Non-pressure chronic ulcer of unspecified part of left lower leg limited to breakdown of skin: Secondary | ICD-10-CM

## 2017-06-10 DIAGNOSIS — H5712 Ocular pain, left eye: Secondary | ICD-10-CM

## 2017-06-10 DIAGNOSIS — H04202 Unspecified epiphora, left lacrimal gland: Secondary | ICD-10-CM | POA: Diagnosis not present

## 2017-06-10 DIAGNOSIS — Z8701 Personal history of pneumonia (recurrent): Secondary | ICD-10-CM | POA: Insufficient documentation

## 2017-06-10 DIAGNOSIS — R609 Edema, unspecified: Secondary | ICD-10-CM

## 2017-06-10 DIAGNOSIS — J984 Other disorders of lung: Secondary | ICD-10-CM

## 2017-06-10 MED ORDER — DOXYCYCLINE HYCLATE 100 MG PO TABS: 100.0000 mg | ORAL_TABLET | Freq: Two times a day (BID) | ORAL | 0 refills | Status: AC

## 2017-06-10 MED ORDER — CEFTRIAXONE SODIUM 1 G IJ SOLR
1.0000 g | Freq: Once | INTRAMUSCULAR | Status: AC
  Administered 2017-06-10: 1 g via INTRAMUSCULAR

## 2017-06-10 MED ORDER — ARTIFICIAL TEARS OPHTHALMIC OINT: TOPICAL_OINTMENT | Freq: Three times a day (TID) | OPHTHALMIC | 0 refills | Status: AC

## 2017-06-10 MED ORDER — TRAMADOL HCL 50 MG PO TABS: 50.0000 mg | ORAL_TABLET | Freq: Three times a day (TID) | ORAL | 0 refills | Status: DC | PRN

## 2017-06-10 NOTE — Patient Instructions (Addendum)
Appointment with [facility]  [address] [place]  Apply eye lubricant Avoid rubbing
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
## 2017-06-11 ENCOUNTER — Telehealth: Payer: Self-pay | Admitting: Physician Assistant

## 2017-06-11 ENCOUNTER — Other Ambulatory Visit: Payer: Self-pay | Admitting: Physician Assistant

## 2017-06-11 DIAGNOSIS — L97921 Non-pressure chronic ulcer of unspecified part of left lower leg limited to breakdown of skin: Secondary | ICD-10-CM

## 2017-06-11 NOTE — Telephone Encounter (Signed)
Patient called request to have a referral for wound specialist near his home in [place] area instead of [place] area that is where he lives. He has not received an appointment yet so he wanted to call to advise. Thanks

## 2017-06-12 ENCOUNTER — Encounter: Payer: Self-pay | Admitting: Physician Assistant

## 2017-06-12 DIAGNOSIS — H16002 Unspecified corneal ulcer, left eye: Secondary | ICD-10-CM

## 2017-06-12 HISTORY — DX: Unspecified corneal ulcer, left eye: H16.002

## 2017-06-14 LAB — WOUND CULTURE
MICRO NUMBER:: 90040982
SPECIMEN QUALITY: ADEQUATE

## 2017-06-19 ENCOUNTER — Encounter: Payer: 59 | Attending: Physician Assistant | Admitting: Physician Assistant

## 2017-06-19 DIAGNOSIS — Z6822 Body mass index (BMI) 22.0-22.9, adult: Secondary | ICD-10-CM | POA: Insufficient documentation

## 2017-06-19 DIAGNOSIS — G4730 Sleep apnea, unspecified: Secondary | ICD-10-CM | POA: Diagnosis not present

## 2017-06-19 DIAGNOSIS — F419 Anxiety disorder, unspecified: Secondary | ICD-10-CM | POA: Insufficient documentation

## 2017-06-19 DIAGNOSIS — I890 Lymphedema, not elsewhere classified: Secondary | ICD-10-CM | POA: Insufficient documentation

## 2017-06-19 DIAGNOSIS — E6609 Other obesity due to excess calories: Secondary | ICD-10-CM | POA: Diagnosis not present

## 2017-06-19 DIAGNOSIS — L97822 Non-pressure chronic ulcer of other part of left lower leg with fat layer exposed: Secondary | ICD-10-CM | POA: Diagnosis not present

## 2017-06-20 NOTE — Progress Notes (Signed)
[name] ([id number]) Visit Report for 06/19/2017 Abuse/Suicide Risk Screen Details Patient Name: [name], [name]. Date of Service: 06/19/2017 12:30 PM Medical Record Number: [id number] Patient Account Number: [id number] Date of Birth/Sex: 07-29-1960 (57 y.o. Male) Treating RN: [name] Primary Care [name]: [name] Other Clinician: Referring [name]: [name] Treating [name]/Extender: STONE III, [name] Weeks in Treatment: 0 Abuse/Suicide Risk Screen Items Answer ABUSE/SUICIDE RISK SCREEN: Has anyone close to you tried to hurt or harm you recentlyo No Do you feel uncomfortable with anyone in your familyo No Has anyone forced you do things that you didnot want to doo No Do you have any thoughts of harming yourselfo No Patient displays signs or symptoms of abuse and/or neglect. No Electronic Signature(s) Signed: 06/19/2017 5:14:03 PM By: [name] Entered By: [name] on 06/19/2017 12:57:23 [name], [name]. ([id number]) -------------------------------------------------------------------------------- Activities of Daily Living Details Patient Name: [name], [name]. Date of Service: 06/19/2017 12:30 PM Medical Record Number: [id number] Patient Account Number: [id number] Date of Birth/Sex: 08/14/1960 (57 y.o. Male) Treating RN: [name] Primary Care [name]: [name] Other Clinician: Referring [name]: [name] Treating [name]/Extender: STONE III, [name] Weeks in Treatment: 0 Activities of Daily Living Items Answer Activities of Daily Living (Please select one for each item) Drive Automobile Completely Able Take Medications Completely Able Use Telephone Completely Able Care for Appearance Completely Able Use Toilet Completely Able Bath / Shower Completely Able Dress Self Completely Able Feed Self Completely Able Walk Completely Able Get In / Out Bed Completely Able Housework Completely Able Prepare Meals Completely  [place] for Self Completely Able Electronic Signature(s) Signed: 06/19/2017 5:14:03 PM By: [name] Entered By: [name] on 06/19/2017 12:57:49 [name], [name]. ([id number]) -------------------------------------------------------------------------------- Education Assessment Details Patient Name: [name], [name]. Date of Service: 06/19/2017 12:30 PM Medical Record Number: [id number] Patient Account Number: [id number] Date of Birth/Sex: [birth year] (57 y.o. Male) Treating RN: [name] Primary Care Shalise Rosado: [name] Other Clinician: Referring [name]: [name] Treating [name]/Extender: [name], [name] Weeks in Treatment: 0 Primary Learner Assessed: Patient Learning Preferences/Education Level/Primary Language Learning Preference: Explanation Highest Education Level: College or Above Preferred Language: English Cognitive Barrier Assessment/Beliefs Language Barrier: No Translator Needed: No Memory Deficit: No Emotional Barrier: No Cultural/Religious Beliefs Affecting Medical Care: No Physical Barrier Assessment Impaired Vision: Yes Glasses Impaired Hearing: No Decreased Hand dexterity: No Knowledge/Comprehension Assessment Knowledge Level: High Comprehension Level: High Ability to understand written High instructions: Ability to understand verbal High instructions: Motivation Assessment Anxiety Level: Calm Cooperation: Cooperative Education Importance: Acknowledges Need Interest in Health Problems: Asks Questions Perception: Coherent Willingness to Engage in Self- High Management Activities: Readiness to Engage in Self- High Management Activities: Electronic Signature(s) Signed: 06/19/2017 5:14:03 PM By: [name] Entered By: [name] on 06/19/2017 13:01:50 [name], [name] ([id number]) -------------------------------------------------------------------------------- Fall Risk Assessment  Details Patient Name: [name], [name]. Date of Service: 06/19/2017 12:30 PM Medical Record Number: [id number] Patient Account Number: [id number] Date of Birth/Sex: [birth year] (57 y.o. Male) Treating RN: [name] Primary Care [place] Spees: [name] Other Clinician: Referring [name]: [name] Treating [name]/Extender: STONE III, [name] Weeks in Treatment: 0 Fall Risk Assessment Items Have you had 2 or more falls in the last 12 monthso 0 No Have you had any fall that resulted in injury in the last 12 monthso 0 No FALL RISK ASSESSMENT: History of falling - immediate or within 3 months 0 No Secondary diagnosis 0 No Ambulatory aid None/bed rest/wheelchair/nurse 0 Yes Crutches/cane/walker 0 No Furniture  0 No IV Access/Saline Lock 0 No Gait/Training Normal/bed rest/immobile 0 Yes Weak 0 No Impaired 0 No Mental Status Oriented to own ability 0 Yes Electronic Signature(s) Signed: 06/19/2017 5:14:03 PM By: [name] Entered By: [name] on 06/19/2017 13:02:02 [name], [name]. ([id number]) -------------------------------------------------------------------------------- Nutrition Risk Assessment Details Patient Name: [name], [name]. Date of Service: 06/19/2017 12:30 PM Medical Record Number: [id number] Patient Account Number: [id number] Date of Birth/Sex: 14-Jul-1960 (57 y.o. Male) Treating RN: [name] Primary Care Kunta Hilleary: [name] Other Clinician: Referring [name]: [name] Treating [name]/Extender: STONE III, [name] Weeks in Treatment: 0 Height (in): Weight (lbs): Body Mass Index (BMI): Nutrition Risk Assessment Items NUTRITION RISK SCREEN: I have an illness or condition that made me change the kind and/or amount of 0 No food I eat I eat fewer than two meals per day 0 No I eat few fruits and vegetables, or milk products 0 No I have three or more drinks of beer, liquor or wine almost every day 0 No I have tooth or mouth  problems that make it hard for me to eat 0 No I don't always have enough money to buy the food I need 0 No I eat alone most of the time 0 No I take three or more different prescribed or over-the-counter drugs a day 0 No Without wanting to, I have lost or gained 10 pounds in the last six months 0 No I am not always physically able to shop, cook and/or feed myself 0 No Nutrition Protocols Good Risk Protocol 0 No interventions needed Moderate Risk Protocol Electronic Signature(s) Signed: 06/19/2017 5:14:03 PM By: [name] Entered By: [name] on 06/19/2017 13:02:07

## 2017-06-20 NOTE — Progress Notes (Addendum)
DERRICO, ZHONG ([id number]) Visit Report for 06/19/2017 Allergy List Details Patient Name: [name], [name] Date of Service: 06/19/2017 12:30 PM Medical Record Number: [id number] Patient Account Number: [id number] Date of Birth/Sex: 1961-01-14 (57 y.o. Male) Treating RN: [name] Primary Care [name]: [name] Other Clinician: Referring [name]: [name] Treating [name]/Extender: [name], [name] Weeks in Treatment: 0 Allergies Active Allergies sulfa Allergy Notes Electronic Signature(s) Signed: 06/19/2017 5:14:03 PM By: [name] Entered By: [name] on 06/19/2017 12:47:42 [name], [name] ([id number]) -------------------------------------------------------------------------------- Arrival Information Details Patient Name: [name], [name] Date of Service: 06/19/2017 12:30 PM Medical Record Number: [id number] Patient Account Number: [id number] Date of Birth/Sex: November 08, 1960 (57 y.o. Male) Treating RN: [name] Primary Care Rockell Faulks: [name] Other Clinician: Referring Sanvika Cuttino: [name] Treating Darshan Solanki/Extender: [name], [name] Weeks in Treatment: 0 Visit Information Patient Arrived: Ambulatory Arrival Time: 12:45 Accompanied By: self Transfer Assistance: None Patient Identification Verified: Yes Secondary Verification Process Completed: Yes Electronic Signature(s) Signed: 06/19/2017 5:14:03 PM By: [name] Entered By: [name] on 06/19/2017 12:46:17 [name], [name] ([id number]) -------------------------------------------------------------------------------- Clinic Level of Care Assessment Details Patient Name: [name], [name] Date of Service: 06/19/2017 12:30 PM Medical Record Number: [id number] Patient Account Number: [id number] Date of Birth/Sex: [birth year] (57 y.o. Male) Treating RN: [name] Primary Care Jamiria Langill: [name] Other Clinician: Referring Lisbet Busker: [name] Treating  [name]/Extender: [name], [name] Weeks in Treatment: 0 Clinic Level of Care Assessment Items TOOL 1 Quantity Score X - Use when EandM and Procedure is performed on INITIAL visit 1 0 ASSESSMENTS - Nursing Assessment / Reassessment X - General Physical Exam (combine w/ comprehensive assessment (listed just below) when 1 20 performed on new pt. evals) X- 1 25 Comprehensive Assessment (HX, ROS, Risk Assessments, Wounds Hx, etc.) ASSESSMENTS - Wound and Skin Assessment / Reassessment []  - Dermatologic / Skin Assessment (not related to wound area) 0 ASSESSMENTS - Ostomy and/or Continence Assessment and Care []  - Incontinence Assessment and Management 0 []  - 0 Ostomy Care Assessment and Management (repouching, etc.) PROCESS - Coordination of Care []  - Simple Patient / Family Education for ongoing care 0 X- 1 20 Complex (extensive) Patient / Family Education for ongoing care X- 1 10 Staff obtains Programmer, systems, Records, Test Results / Process Orders []  - 0 Staff telephones HHA, Nursing Homes / Clarify orders / etc []  - 0 Routine Transfer to another Facility (non-emergent condition) []  - 0 Routine Hospital Admission (non-emergent condition) []  - 0 New Admissions / Biomedical engineer / Ordering NPWT, Apligraf, etc. []  - 0 Emergency Hospital Admission (emergent condition) PROCESS - Special Needs []  - Pediatric / Minor Patient Management 0 []  - 0 Isolation Patient Management []  - 0 Hearing / Language / Visual special needs []  - 0 Assessment of Community assistance (transportation, D/C planning, etc.) []  - 0 Additional assistance / Altered mentation []  - 0 Support Surface(s) Assessment (bed, cushion, seat, etc.) [name], [name] ([id number]) INTERVENTIONS - Miscellaneous []  - External ear exam 0 []  - 0 Patient Transfer (multiple staff / Civil Service fast streamer / Similar devices) []  - 0 Simple Staple / Suture removal (25 or less) []  - 0 Complex Staple / Suture removal (26 or more) []  -  0 Hypo/Hyperglycemic Management (do not check if billed separately) X- 1 15 Ankle / Brachial Index (ABI) - do not check if billed separately Has the patient been seen at the hospital within the last three years: Yes Total Score: 90 Level Of Care: New/Established - Level 3 Electronic Signature(s) Signed:  06/19/2017 5:14:03 PM By: [name] Entered By: [name] on 06/19/2017 13:58:04 [name] ([id number]) -------------------------------------------------------------------------------- Encounter Discharge Information Details Patient Name: [name], [name] Date of Service: 06/19/2017 12:30 PM Medical Record Number: [id number] Patient Account Number: [id number] Date of Birth/Sex: September 17, 1960 (57 y.o. Male) Treating RN: [name] Primary Care Acelyn Basham: [name] Other Clinician: Referring [name]: [name] Treating [name]/Extender: [name], [name] Weeks in Treatment: 0 Encounter Discharge Information Items Discharge Pain Level: 0 Discharge Condition: Stable Ambulatory Status: Ambulatory Discharge Destination: Home Transportation: Private Auto Accompanied By: self Schedule Follow-up Appointment: Yes Medication Reconciliation completed and No provided to Patient/Care [name]: Provided on Clinical Summary of Care: 06/19/2017 Form Type Recipient Paper Patient LF Electronic Signature(s) Signed: 06/19/2017 5:14:03 PM By: [name] Entered By: [name] on 06/19/2017 13:59:20 [name], [name] ([id number]) -------------------------------------------------------------------------------- Lower Extremity Assessment Details Patient Name: [name], [name] Date of Service: 06/19/2017 12:30 PM Medical Record Number: [id number] Patient Account Number: [id number] Date of Birth/Sex: [birth year] (57 y.o. Male) Treating RN: [name] Primary Care Bianco Cange: [name] Other Clinician: Referring [name]: [name] Treating  [name]/Extender: [name], [name] Weeks in Treatment: 0 Edema Assessment Assessed: [Left: No] [Right: No] Edema: [Left: N] [Right: o] Vascular Assessment Claudication: Claudication Assessment [Left:None] Pulses: Dorsalis Pedis Palpable: [Left:Yes] Doppler Audible: [Left:Yes] Posterior Tibial Extremity colors, hair growth, and conditions: Extremity Color: [Left:Normal] Hair Growth on Extremity: [Left:No] Temperature of Extremity: [Left:Warm] Capillary Refill: [Left:< 3 seconds] Blood Pressure: Brachial: [Left:136] Dorsalis Pedis: 146 [Left:Dorsalis Pedis:] Ankle: Posterior Tibial: 134 [Left:Posterior Tibial: 1.07] Toe Nail Assessment Left: Right: Thick: Yes Discolored: Yes Deformed: Yes Improper Length and Hygiene: Yes Electronic Signature(s) Signed: 06/19/2017 5:14:03 PM By: [name] Entered By: [name] on 06/19/2017 13:26:05 Printz, Othman C. ([id number]) -------------------------------------------------------------------------------- Multi Wound Chart Details Patient Name: [name], [name] Date of Service: 06/19/2017 12:30 PM Medical Record Number: [id number] Patient Account Number: [id number] Date of Birth/Sex: 1960-08-04 (57 y.o. Male) Treating RN: [name] Primary Care Rieley Hausman: [name] Other Clinician: Referring Tyan Lasure: [name] Treating Sarahmarie Leavey/Extender: [name], [name] Weeks in Treatment: 0 Vital Signs Height(in): 75 Pulse(bpm): 87 Weight(lbs): 179 Blood Pressure(mmHg): 135/77 Body Mass Index(BMI): 22 Temperature(F): 98.4 Respiratory Rate 20 (breaths/min): Photos: [1:No Photos] [2:No Photos] [3:No Photos] Wound Location: [1:Left Lower Leg - Medial, Proximal] [2:Left Lower Leg - Midline] [3:Left Lower Leg - Midline, Distal] Wounding Event: [1:Gradually Appeared] [2:Gradually Appeared] [3:Gradually Appeared] Primary Etiology: [1:Lymphedema] [2:Lymphedema] [3:Lymphedema] Comorbid History: [1:Sleep Apnea, Confinement  Anxiety] [2:Sleep Apnea, Confinement Anxiety] [3:Sleep Apnea, Confinement Anxiety] Date Acquired: [1:01/17/2017] [2:01/17/2017] [3:01/17/2017] Weeks of Treatment: [1:0] [2:0] [3:0] Wound Status: [1:Open] [2:Open] [3:Open] Measurements L x W x D [1:0.8x1.1x0.1] [2:2.5x2.6x0.1] [3:0.4x0.4x0.1] (cm) Area (cm) : [1:0.691] [2:5.105] [3:0.126] Volume (cm) : [1:0.069] [2:0.511] [3:0.013] Classification: [1:Full Thickness Without Exposed Support Structures] [2:Full Thickness Without Exposed Support Structures] [3:Partial Thickness] Exudate Amount: [1:Medium] [2:Large] [3:Small] Exudate Type: [1:Serosanguineous] [2:Serosanguineous] [3:Serosanguineous] Exudate Color: [1:red, brown] [2:red, brown] [3:red, brown] Wound Margin: [1:Flat and Intact] [2:Flat and Intact] [3:Flat and Intact] Granulation Amount: [1:Small (1-33%)] [2:Small (1-33%)] [3:Medium (34-66%)] Granulation Quality: [1:Red] [2:Red] [3:Red] Necrotic Amount: [1:Large (67-100%)] [2:Large (67-100%)] [3:Medium (34-66%)] Exposed Structures: [1:Fat Layer (Subcutaneous Tissue) Exposed: Yes Fascia: No Tendon: No Muscle: No Joint: No Bone: No] [2:Fascia: No Fat Layer (Subcutaneous Tissue) Exposed: No Tendon: No Muscle: No Joint: No Bone: No] [3:Fascia: No Fat Layer (Subcutaneous Tissue)  Exposed: No Tendon: No Muscle: No Joint: No Bone: No] Epithelialization: [1:None] [2:None] [3:None] Periwound Skin Texture: [1:Excoriation: Yes Scarring: Yes Induration: No Callus: No Crepitus:  No Rash: No] [2:Excoriation: Yes Induration: No Callus: No Crepitus: No Rash: No Scarring: No] [3:Excoriation: Yes Induration: No Callus: No Crepitus: No Rash: No Scarring: No] Periwound Skin Moisture: Maceration: No Maceration: No Maceration: No Dry/Scaly: No Dry/Scaly: No Dry/Scaly: No Periwound Skin Color: Erythema: Yes Erythema: Yes Erythema: Yes Atrophie Blanche: No Atrophie Blanche: No Atrophie Blanche: No Cyanosis: No Cyanosis: No Cyanosis:  No Ecchymosis: No Ecchymosis: No Ecchymosis: No Hemosiderin Staining: No Hemosiderin Staining: No Hemosiderin Staining: No Mottled: No Mottled: No Mottled: No Pallor: No Pallor: No Pallor: No Rubor: No Rubor: No Rubor: No Erythema Location: Circumferential Circumferential Circumferential Tenderness on Palpation: No No No Wound Preparation: Ulcer Cleansing: Ulcer Cleansing: Ulcer Cleansing: Rinsed/Irrigated with Saline Rinsed/Irrigated with Saline Rinsed/Irrigated with Saline Topical Anesthetic Applied: Topical Anesthetic Applied: Topical Anesthetic Applied: Other: lidocaine 4% Other: lisdocaine 4% Other: lidocaine 4% Treatment Notes Electronic Signature(s) Signed: 06/19/2017 5:14:03 PM By: [name] Entered By: [name] on 06/19/2017 13:28:05 Barcelo, Jhamir CMarland Kitchen ([id number]) -------------------------------------------------------------------------------- [facility] Details Patient Name: [name], [name] Date of Service: 06/19/2017 12:30 PM Medical Record Number: [id number] Patient Account Number: [id number] Date of Birth/Sex: [birth year] (57 y.o. Male) Treating RN: [name] Primary Care Armend Hochstatter: [name] Other Clinician: Referring Sobia Karger: [name] Treating Ceili Boshers/Extender: [name], [name] Weeks in Treatment: 0 Active Inactive ` Orientation to the Wound Care Program Nursing Diagnoses: Knowledge deficit related to the wound healing center program Goals: Patient/caregiver will verbalize understanding of the [facility] Program Date Initiated: 06/19/2017 Target Resolution Date: 07/10/2017 Goal Status: Active Interventions: Provide education on orientation to the wound center Notes: ` Wound/Skin Impairment Nursing Diagnoses: Impaired tissue integrity Goals: Patient/caregiver will verbalize understanding of skin care regimen Date Initiated: 06/19/2017 Target Resolution Date: 07/10/2017 Goal Status:  Active Ulcer/skin breakdown will have a volume reduction of 30% by week 4 Date Initiated: 06/19/2017 Target Resolution Date: 07/10/2017 Goal Status: Active Interventions: Assess patient/caregiver ability to obtain necessary supplies Assess patient/caregiver ability to perform ulcer/skin care regimen upon admission and as needed Assess ulceration(s) every visit Treatment Activities: Skin care regimen initiated : 06/19/2017 Notes: Electronic Signature(s) Signed: 06/19/2017 5:14:03 PM By: Aquilla Hacker, Mechele Claude ([id number]) Entered By: [name] on 06/19/2017 13:27:47 Medina, Dondi C. ([id number]) -------------------------------------------------------------------------------- Pain Assessment Details Patient Name: [name], [name] Date of Service: 06/19/2017 12:30 PM Medical Record Number: [id number] Patient Account Number: [id number] Date of Birth/Sex: [birth year] (57 y.o. Male) Treating RN: [name] Primary Care Halo Laski: [name] Other Clinician: Referring Marsden Zaino: [name] Treating Nevaan Bunton/Extender: [name], [name] Weeks in Treatment: 0 Active Problems Location of Pain Severity and Description of Pain Patient Has Paino Yes Site Locations Duration of the Pain. Constant / Intermittento Intermittent Character of Pain Describe the Pain: Aching Pain Management and Medication Current Pain Management: Notes mostly hurts with ambulation Electronic Signature(s) Signed: 06/19/2017 5:14:03 PM By: [name] Entered By: [name] on 06/19/2017 12:47:03 Santellan, Denney Loletha [name] ([id number]) -------------------------------------------------------------------------------- Patient/Caregiver Education Details Patient Name: [name], [name] Date of Service: 06/19/2017 12:30 PM Medical Record Number: [id number] Patient Account Number: [id number] Date of Birth/Gender: [birth year] (57 y.o. Male) Treating RN: [name] Primary Care Physician: [name] Other  Clinician: Referring Physician: Nelson [name] Treating Physician/Extender: Sharalyn Ink in Treatment: 0 Education Assessment Education Provided To: Patient Education Topics Provided Welcome To [facility]: Handouts: Welcome To [facility] Methods: Explain/Verbal Responses: State content correctly Wound Debridement: Handouts: Wound Debridement Methods: Explain/Verbal Responses: State content correctly Wound/Skin Impairment: Handouts: Caring for Your Ulcer  Methods: Explain/Verbal Responses: State content correctly Electronic Signature(s) Signed: 06/19/2017 5:14:03 PM By: [name] Entered By: [name] on 06/19/2017 13:59:41 Tweten, Kelley C. ([id number]) -------------------------------------------------------------------------------- Wound Assessment Details Patient Name: [name], [name] Date of Service: 06/19/2017 12:30 PM Medical Record Number: [id number] Patient Account Number: [id number] Date of Birth/Sex: 26-Nov-1960 (57 y.o. Male) Treating RN: [name] Primary Care Frederich Montilla: [name] Other Clinician: Referring Matha Masse: [name] Treating Maribel Luis/Extender: [name], [name] Weeks in Treatment: 0 Wound Status Wound Number: 1 Primary Etiology: Lymphedema Wound Location: Left Lower Leg - Medial, Proximal Wound Status: Open Wounding Event: Gradually Appeared Comorbid History: Sleep Apnea, Confinement Anxiety Date Acquired: 01/17/2017 Weeks Of Treatment: 0 Clustered Wound: No Photos Photo Uploaded By: [name] on 06/19/2017 17:12:28 Wound Measurements Length: (cm) 0.8 Width: (cm) 1.1 Depth: (cm) 0.1 Area: (cm) 0.691 Volume: (cm) 0.069 % Reduction in Area: % Reduction in Volume: Epithelialization: None Tunneling: No Undermining: No Wound Description Full Thickness Without Exposed Support Classification: Structures Wound Margin: Flat and Intact Exudate Medium Amount: Exudate Type:  Serosanguineous Exudate Color: red, brown Foul Odor After Cleansing: No Slough/Fibrino Yes Wound Bed Granulation Amount: Small (1-33%) Exposed Structure Granulation Quality: Red Fascia Exposed: No Necrotic Amount: Large (67-100%) Fat Layer (Subcutaneous Tissue) Exposed: Yes Necrotic Quality: Adherent Slough Tendon Exposed: No Muscle Exposed: No Joint Exposed: No Bone Exposed: No Salamon, Brittain C. ([id number]) Periwound Skin Texture Texture Color No Abnormalities Noted: No No Abnormalities Noted: No Callus: No Atrophie Blanche: No Crepitus: No Cyanosis: No Excoriation: Yes Ecchymosis: No Induration: No Erythema: Yes Rash: No Erythema Location: Circumferential Scarring: Yes Hemosiderin Staining: No Mottled: No Moisture Pallor: No No Abnormalities Noted: No Rubor: No Dry / Scaly: No Maceration: No Wound Preparation Ulcer Cleansing: Rinsed/Irrigated with Saline Topical Anesthetic Applied: Other: lidocaine 4%, Electronic Signature(s) Signed: 06/19/2017 5:14:03 PM By: [name] Entered By: [name] on 06/19/2017 13:12:38 Porche, Christoher C. ([id number]) -------------------------------------------------------------------------------- Wound Assessment Details Patient Name: [name], [name] Date of Service: 06/19/2017 12:30 PM Medical Record Number: [id number] Patient Account Number: [id number] Date of Birth/Sex: [birth year] (57 y.o. Male) Treating RN: [name] Primary Care Kani Chauvin: [name] Other Clinician: Referring Nitara Szczerba: [name] Treating Sadhana Frater/Extender: [name], [name] Weeks in Treatment: 0 Wound Status Wound Number: 2 Primary Etiology: Lymphedema Wound Location: Left Lower Leg - Midline Wound Status: Open Wounding Event: Gradually Appeared Comorbid History: Sleep Apnea, Confinement Anxiety Date Acquired: 01/17/2017 Weeks Of Treatment: 0 Clustered Wound: No Photos Photo Uploaded By: [name] on 06/19/2017 17:12:28 Wound  Measurements Length: (cm) 2.5 Width: (cm) 2.6 Depth: (cm) 0.1 Area: (cm) 5.105 Volume: (cm) 0.511 % Reduction in Area: % Reduction in Volume: Epithelialization: None Tunneling: No Undermining: No Wound Description Full Thickness Without Exposed Support Classification: Structures Wound Margin: Flat and Intact Exudate Large Amount: Exudate Type: Serosanguineous Exudate Color: red, brown Foul Odor After Cleansing: No Wound Bed Granulation Amount: Small (1-33%) Exposed Structure Granulation Quality: Red Fascia Exposed: No Necrotic Amount: Large (67-100%) Fat Layer (Subcutaneous Tissue) Exposed: No Necrotic Quality: Adherent Slough Tendon Exposed: No Muscle Exposed: No Joint Exposed: No Bone Exposed: No Morken, Gustavo C. ([id number]) Periwound Skin Texture Texture Color No Abnormalities Noted: No No Abnormalities Noted: No Callus: No Atrophie Blanche: No Crepitus: No Cyanosis: No Excoriation: Yes Ecchymosis: No Induration: No Erythema: Yes Rash: No Erythema Location: Circumferential Scarring: No Hemosiderin Staining: No Mottled: No Moisture Pallor: No No Abnormalities Noted: No Rubor: No Dry / Scaly: No Maceration: No Wound Preparation Ulcer Cleansing: Rinsed/Irrigated with Saline Topical Anesthetic Applied: Other: lisdocaine  4%, Electronic Signature(s) Signed: 06/19/2017 5:14:03 PM By: [name] Entered By: [name] on 06/19/2017 13:16:23 Jenniges, Worley C. ([id number]) -------------------------------------------------------------------------------- Wound Assessment Details Patient Name: [name], [name] Date of Service: 06/19/2017 12:30 PM Medical Record Number: [id number] Patient Account Number: [id number] Date of Birth/Sex: 1960/06/17 (57 y.o. Male) Treating RN: [name] Primary Care Sharvi Mooneyhan: [name] Other Clinician: Referring Tahra Hitzeman: [name] Treating Janneth Krasner/Extender: [name], [name] Weeks in Treatment: 0 Wound  Status Wound Number: 3 Primary Etiology: Lymphedema Wound Location: Left Lower Leg - Midline, Distal Wound Status: Open Wounding Event: Gradually Appeared Comorbid History: Sleep Apnea, Confinement Anxiety Date Acquired: 01/17/2017 Weeks Of Treatment: 0 Clustered Wound: No Photos Photo Uploaded By: [name] on 06/19/2017 17:12:44 Wound Measurements Length: (cm) 0.4 Width: (cm) 0.4 Depth: (cm) 0.1 Area: (cm) 0.126 Volume: (cm) 0.013 % Reduction in Area: % Reduction in Volume: Epithelialization: None Tunneling: No Undermining: No Wound Description Classification: Partial Thickness Wound Margin: Flat and Intact Exudate Amount: Small Exudate Type: Serosanguineous Exudate Color: red, brown Foul Odor After Cleansing: No Slough/Fibrino Yes Wound Bed Granulation Amount: Medium (34-66%) Exposed Structure Granulation Quality: Red Fascia Exposed: No Necrotic Amount: Medium (34-66%) Fat Layer (Subcutaneous Tissue) Exposed: No Necrotic Quality: Adherent Slough Tendon Exposed: No Muscle Exposed: No Joint Exposed: No Bone Exposed: No Periwound Skin Texture Cipriani, [place] C. ([id number]) Texture Color No Abnormalities Noted: No No Abnormalities Noted: No Callus: No Atrophie Blanche: No Crepitus: No Cyanosis: No Excoriation: Yes Ecchymosis: No Induration: No Erythema: Yes Rash: No Erythema Location: Circumferential Scarring: No Hemosiderin Staining: No Mottled: No Moisture Pallor: No No Abnormalities Noted: No Rubor: No Dry / Scaly: No Maceration: No Wound Preparation Ulcer Cleansing: Rinsed/Irrigated with Saline Topical Anesthetic Applied: Other: lidocaine 4%, Electronic Signature(s) Signed: 06/19/2017 5:14:03 PM By: [name] Entered By: [name] on 06/19/2017 13:19:24 Greener, Viyan C. ([id number]) -------------------------------------------------------------------------------- Vitals Details Patient Name: [name], [name] Date of Service:  06/19/2017 12:30 PM Medical Record Number: [id number] Patient Account Number: [id number] Date of Birth/Sex: 02-Jun-1961 (57 y.o. Male) Treating RN: [name] Primary Care Rutger Salton: [name] Other Clinician: Referring Sutter Ahlgren: [name] Treating Trudee Chirino/Extender: [name], [name] Weeks in Treatment: 0 Vital Signs Time Taken: 01:04 Temperature (F): 98.4 Height (in): 75 Pulse (bpm): 87 Source: Stated Respiratory Rate (breaths/min): 20 Weight (lbs): 179 Blood Pressure (mmHg): 135/77 Body Mass Index (BMI): 22.4 Reference Range: 80 - 120 mg / dl Electronic Signature(s) Signed: 06/19/2017 5:14:03 PM By: [name] Entered By: [name] on 06/19/2017 13:05:12

## 2017-06-21 ENCOUNTER — Encounter: Payer: Self-pay | Admitting: Physician Assistant

## 2017-06-21 DIAGNOSIS — J984 Other disorders of lung: Secondary | ICD-10-CM

## 2017-06-21 DIAGNOSIS — H04202 Unspecified epiphora, left lacrimal gland: Secondary | ICD-10-CM | POA: Insufficient documentation

## 2017-06-21 DIAGNOSIS — R609 Edema, unspecified: Secondary | ICD-10-CM | POA: Insufficient documentation

## 2017-06-21 HISTORY — DX: Other disorders of lung: J98.4

## 2017-06-22 NOTE — Progress Notes (Addendum)
DANIELE, YANKOWSKI ([id number]) Visit Report for 06/19/2017 Chief Complaint Document Details Patient Name: Galka, Nirav C. Date of Service: 06/19/2017 12:30 PM Medical Record Number: [id number] Patient Account Number: [id number] Date of Birth/Sex: 1961/03/06 (57 y.o. Male) Treating RN: Roger Shelter Primary Care Provider: Nelson Chimes Other Clinician: Referring Provider: Nelson Chimes Treating Provider/Extender: [name], Keldan Eplin Weeks in Treatment: 0 Information Obtained from: Patient Chief Complaint Left lower extremity ulcers and lymphedema Electronic Signature(s) Signed: 06/29/2017 1:06:23 AM By: Worthy Keeler PA-C Entered By: Worthy Keeler on 06/29/2017 00:57:09 Laban, Arihaan C. ([id number]) -------------------------------------------------------------------------------- Debridement Details Patient Name: Kidd, Malacai C. Date of Service: 06/19/2017 12:30 PM Medical Record Number: [id number] Patient Account Number: [id number] Date of Birth/Sex: Apr 23, 1961 (57 y.o. Male) Treating RN: Roger Shelter Primary Care Provider: Nelson Chimes Other Clinician: Referring Provider: Nelson Chimes Treating Provider/Extender: STONE III, Lagretta Loseke Weeks in Treatment: 0 Debridement Performed for Wound #3 Left,Distal,Midline Lower Leg Assessment: Performed By: Physician STONE III, Anjanette Gilkey E., PA-C Debridement: Debridement Pre-procedure Verification/Time Yes - 01:41 Out Taken: Start Time: 01:41 Pain Control: Other : lidocaIne4 % Level: Skin/Subcutaneous Tissue Total Area Debrided (L x W): 0.4 (cm) x 0.4 (cm) = 0.16 (cm) Tissue and other material Viable, Non-Viable, Fibrin/Slough, Skin, Subcutaneous debrided: Instrument: Curette Bleeding: Minimum Hemostasis Achieved: Pressure End Time: 01:42 Procedural Pain: 0 Post Procedural Pain: 0 Response to Treatment: Procedure was tolerated well Post Debridement Measurements of Total Wound Length: (cm) 0.4 Width: (cm) 0.4 Depth: (cm) 0.2 Volume:  (cm) 0.025 Character of Wound/Ulcer Post Debridement: Stable Post Procedure Diagnosis Same as Pre-procedure Electronic Signature(s) Signed: 06/19/2017 5:14:03 PM By: Roger Shelter Signed: 06/22/2017 11:19:49 AM By: Worthy Keeler PA-C Entered By: Roger Shelter on 06/19/2017 13:42:25 Bresnan, Jaheem C. ([id number]) -------------------------------------------------------------------------------- Debridement Details Patient Name: Todaro, Lenville C. Date of Service: 06/19/2017 12:30 PM Medical Record Number: [id number] Patient Account Number: [id number] Date of Birth/Sex: 03-Aug-1960 (57 y.o. Male) Treating RN: Roger Shelter Primary Care Provider: Nelson Chimes Other Clinician: Referring Provider: Nelson Chimes Treating Provider/Extender: STONE III, Alveria Mcglaughlin Weeks in Treatment: 0 Debridement Performed for Wound #2 Left,Midline Lower Leg Assessment: Performed By: Physician STONE III, Worth Kober E., PA-C Debridement: Debridement Pre-procedure Verification/Time Yes - 01:41 Out Taken: Start Time: 01:41 Pain Control: Other : lidocaIne4 % Level: Skin/Subcutaneous Tissue Total Area Debrided (L x W): 2.5 (cm) x 2.6 (cm) = 6.5 (cm) Tissue and other material Viable, Non-Viable, Fibrin/Slough, Skin, Subcutaneous debrided: Instrument: Curette Bleeding: Minimum Hemostasis Achieved: Pressure End Time: 01:42 Procedural Pain: 0 Post Procedural Pain: 0 Response to Treatment: Procedure was tolerated well Post Debridement Measurements of Total Wound Length: (cm) 2.5 Width: (cm) 2.6 Depth: (cm) 0.2 Volume: (cm) 1.021 Character of Wound/Ulcer Post Debridement: Stable Post Procedure Diagnosis Same as Pre-procedure Electronic Signature(s) Signed: 06/19/2017 5:14:03 PM By: Roger Shelter Signed: 06/22/2017 11:19:49 AM By: Worthy Keeler PA-C Entered By: Roger Shelter on 06/19/2017 13:42:59 Cranfield, Rohen C.  ([id number]) -------------------------------------------------------------------------------- Debridement Details Patient Name: Woodroof, Rhyder C. Date of Service: 06/19/2017 12:30 PM Medical Record Number: [id number] Patient Account Number: [id number] Date of Birth/Sex: 1960-09-29 (57 y.o. Male) Treating RN: Roger Shelter Primary Care Provider: Nelson Chimes Other Clinician: Referring Provider: Nelson Chimes Treating Provider/Extender: STONE III, Deeanne Deininger Weeks in Treatment: 0 Debridement Performed for Wound #1 Left,Proximal,Medial Lower Leg Assessment: Performed By: Physician STONE III, Marycatherine Maniscalco E., PA-C Debridement: Debridement Pre-procedure Verification/Time Yes - 01:41 Out Taken: Start Time: 01:41 Pain Control: Other : lidocaIne 4 % Level: Skin/Subcutaneous Tissue Total Area Debrided (L x W): 0.8 (cm) x 1.1 (cm) = 0.88 (  cm) Tissue and other material Viable, Non-Viable, Fibrin/Slough, Skin, Subcutaneous debrided: Instrument: Curette Bleeding: Minimum Hemostasis Achieved: Pressure End Time: 01:42 Procedural Pain: 0 Post Procedural Pain: 0 Response to Treatment: Procedure was tolerated well Post Debridement Measurements of Total Wound Length: (cm) 0.8 Width: (cm) 1.1 Depth: (cm) 0.1 Volume: (cm) 0.069 Character of Wound/Ulcer Post Debridement: Stable Post Procedure Diagnosis Same as Pre-procedure Electronic Signature(s) Signed: 06/19/2017 5:14:03 PM By: Roger Shelter Signed: 06/22/2017 11:19:49 AM By: Worthy Keeler PA-C Entered By: Roger Shelter on 06/19/2017 13:43:36 Jeffus, Kiaan C. ([id number]) -------------------------------------------------------------------------------- HPI Details Patient Name: Frisinger, Cezar C. Date of Service: 06/19/2017 12:30 PM Medical Record Number: [id number] Patient Account Number: [id number] Date of Birth/Sex: August 02, 1960 (57 y.o. Male) Treating RN: Roger Shelter Primary Care Provider: Nelson Chimes Other Clinician: Referring  Provider: Nelson Chimes Treating Provider/Extender: STONE III, Tameya Kuznia Weeks in Treatment: 0 History of Present Illness Associated Signs and Symptoms: Patient has a history of lymphedema as well as obesity. HPI Description: 06/19/17 on evaluation today patient appears to be doing somewhat poorly in regard to his left lower extremity where he has three ulcerations for which he presents today for evaluation regarding. Fortunately each of these areas is somewhat small but nonetheless they are also somewhat painful for him. He tells me that he has been on multiple rounds of antibiotics the past six months but has had a difficult time healing in regard to these ulcers he has been tolerating the dressing changes but right now just Xeroform has been utilized at this point. The last office visit note that I had was from 06/10/17 and that was from the med center in [place] at that point in time patient was placed on doxycycline and a shot of Rocephin 1 g IM was given during the office visit. It was also a wound culture performed on 06/10/17. This revealed few polymorphic nuclear lymphocytes and no epithelial cells seen. Moderate gram-positive cocci in pairs noted but this was not sent for culture and sensitivity. Unfortunately patient continues to have some discomfort as well in regard to this ulcerated area. No fevers, chills, nausea, or vomiting noted at this time. Electronic Signature(s) Signed: 06/29/2017 1:06:23 AM By: Worthy Keeler PA-C Entered By: Worthy Keeler on 06/29/2017 01:02:18 Purtee, Mechele Claude ([id number]) -------------------------------------------------------------------------------- Physical Exam Details Patient Name: Sircy, Labron C. Date of Service: 06/19/2017 12:30 PM Medical Record Number: [id number] Patient Account Number: [id number] Date of Birth/Sex: 12/03/1960 (56 y.o. Male) Treating RN: Roger Shelter Primary Care Provider: Nelson Chimes Other Clinician: Referring Provider:  Nelson Chimes Treating Provider/Extender: STONE III, Bentley Fissel Weeks in Treatment: 0 Constitutional patient is hypertensive.. pulse regular and within target range for patient.Marland Kitchen respirations regular, non-labored and within target range for patient.Marland Kitchen temperature within target range for patient.. Well-nourished and well-hydrated in no acute distress. Eyes conjunctiva clear no eyelid edema noted. pupils equal round and reactive to light and accommodation. Ears, Nose, Mouth, and Throat no gross abnormality of ear auricles or external auditory canals. normal hearing noted during conversation. mucus membranes moist. Respiratory normal breathing without difficulty. clear to auscultation bilaterally. Cardiovascular regular rate and rhythm with normal S1, S2. 2+ dorsalis pedis/posterior tibialis pulses. 2+ pitting edema of the bilateral lower extremities. Gastrointestinal (GI) soft, non-tender, non-distended, +BS. no ventral hernia noted. Musculoskeletal normal gait and posture. no significant deformity or arthritic changes, no loss or range of motion, no clubbing. Psychiatric this patient is able to make decisions and demonstrates good insight into disease process. Alert and Oriented x 3. pleasant and cooperative.  Notes Patient's wounds were slough covered today and did required debridement which was performed without complication during the office visit. He did have some discomfort fortunately post debridement the ones that appeared to be somewhat better. There was definitely a much better granulation bed noted. Electronic Signature(s) Signed: 06/29/2017 1:06:23 AM By: Worthy Keeler PA-C Entered By: Worthy Keeler on 06/29/2017 01:03:53 Wilhelmsen, Jamontae Loletha Grayer ([id number]) -------------------------------------------------------------------------------- Physician Orders Details Patient Name: Dial, Tiran C. Date of Service: 06/19/2017 12:30 PM Medical Record Number: [id number] Patient Account Number:  [id number] Date of Birth/Sex: 07-22-1960 (57 y.o. Male) Treating RN: Roger Shelter Primary Care Provider: Nelson Chimes Other Clinician: Referring Provider: Nelson Chimes Treating Provider/Extender: STONE III, Herny Scurlock Weeks in Treatment: 0 Verbal / Phone Orders: No Diagnosis Coding Wound Cleansing Wound #1 Left,Proximal,Medial Lower Leg o Clean wound with Normal Saline. Wound #2 Left,Midline Lower Leg o Clean wound with Normal Saline. Wound #3 Left,Distal,Midline Lower Leg o Clean wound with Normal Saline. Anesthetic (add to Medication List) Wound #1 Left,Proximal,Medial Lower Leg o Topical Lidocaine 4% cream applied to wound bed prior to debridement (In Clinic Only). Wound #2 Left,Midline Lower Leg o Topical Lidocaine 4% cream applied to wound bed prior to debridement (In Clinic Only). Wound #3 Left,Distal,Midline Lower Leg o Topical Lidocaine 4% cream applied to wound bed prior to debridement (In Clinic Only). Primary Wound Dressing Wound #1 Left,Proximal,Medial Lower Leg o Santyl Ointment Wound #2 Left,Midline Lower Leg o Santyl Ointment Wound #3 Left,Distal,Midline Lower Leg o Prisma Ag Secondary Dressing Wound #1 Left,Proximal,Medial Lower Leg o Boardered Foam Dressing Wound #2 Left,Midline Lower Leg o Boardered Foam Dressing Wound #3 Left,Distal,Midline Lower Leg o Boardered Foam Dressing Dressing Change Frequency Wound #1 Left,Proximal,Medial Lower Leg o Change dressing every day. Nienaber, Charlie C. ([id number]) Wound #2 Left,Midline Lower Leg o Change dressing every day. Wound #3 Left,Distal,Midline Lower Leg o Change dressing every day. Follow-up Appointments Wound #1 Left,Proximal,Medial Lower Leg o Return Appointment in 1 week. Wound #2 Left,Midline Lower Leg o Return Appointment in 1 week. Wound #3 Left,Distal,Midline Lower Leg o Return Appointment in 1 week. Edema Control o Patient to wear own compression  stockings Patient Medications Allergies: sulfa Notifications Medication Indication Start End Santyl 06/22/2017 DOSE topical 250 unit/gram ointment - ointment topical applied nickel thick daily to the wound bed then cover with dressing as directed doxycycline hyclate 06/22/2017 DOSE 1 - oral 100 mg capsule - 1 capsule oral by mouth 2 times a day for 10 days Electronic Signature(s) Signed: 06/22/2017 8:10:28 AM By: Worthy Keeler PA-C Previous Signature: 06/19/2017 5:14:03 PM Version By: Roger Shelter Entered By: Worthy Keeler on 06/22/2017 08:10:27 Sabia, Amani C. ([id number]) -------------------------------------------------------------------------------- Problem List Details Patient Name: Hussey, Koltan C. Date of Service: 06/19/2017 12:30 PM Medical Record Number: [id number] Patient Account Number: [id number] Date of Birth/Sex: 10-Feb-1961 (57 y.o. 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 ([id number]) -------------------------------------------------------------------------------- Progress Note Details Patient Name: Obremski, Deontrae C. Date of Service: 06/19/2017 12:30 PM Medical Record Number: [id number] Patient Account  Number: [id number] Date of Birth/Sex: Mar 17, 1961 (57 y.o. Male) Treating RN: Roger Shelter Primary Care Provider: Nelson Chimes Other  Clinician: Referring Provider: Nelson Chimes Treating Provider/Extender: STONE III, Renaldo Gornick Weeks in Treatment: 0 Subjective Chief Complaint Information obtained from Patient Left lower extremity ulcers and lymphedema History of Present Illness (HPI) The following HPI elements were documented for the patient's wound: Associated Signs and Symptoms: Patient has a history of lymphedema as well as obesity. 06/19/17 on evaluation today patient appears to be doing somewhat poorly in regard to his left lower extremity where he has three ulcerations for which he presents today for evaluation regarding. Fortunately each of these areas is somewhat small but nonetheless they are also somewhat painful for him. He tells me that he has been on multiple rounds of antibiotics the past six months but has had a difficult time healing in regard to these ulcers he has been tolerating the dressing changes but right now just Xeroform has been utilized at this point. The last office visit note that I had was from 06/10/17 and that was from the med center in [place] at that point in time patient was placed on doxycycline and a shot of Rocephin 1 g IM was given during the office visit. It was also a wound culture performed on 06/10/17. This revealed few polymorphic nuclear lymphocytes and no epithelial cells seen. Moderate gram-positive cocci in pairs noted but this was not sent for culture and sensitivity. Unfortunately patient continues to have some discomfort as well in regard to this ulcerated area. No fevers, chills, nausea, or vomiting noted at this time. Wound History Patient presents with 3 open wounds that have been present for approximately several months. Patient has been treating wounds in the following manner: xeroform gauze. Laboratory tests have not been performed in the last month. Patient reportedly has not tested positive for an antibiotic resistant organism. Patient reportedly has not tested  positive for osteomyelitis. Patient reportedly has not had testing performed to evaluate circulation in the legs. Patient History Information obtained from Patient. Allergies sulfa Family History Cancer - Father, Heart Disease - Father,Siblings, No family history of Diabetes, Hereditary Spherocytosis, Hypertension, Kidney Disease, Lung Disease, Seizures, Stroke, Thyroid Problems, Tuberculosis. Social History Current some day smoker, Marital Status - Separated, Alcohol Use - Rarely, Drug Use - No History, Caffeine Use - Daily. Medical History Eyes Denies history of Cataracts, Glaucoma, Optic Neuritis Hematologic/Lymphatic Denies history of Anemia, Hemophilia, Human Immunodeficiency Virus, Lymphedema, Sickle Cell Disease Coppens, Ildefonso C. ([id number]) Respiratory Patient has history of Sleep Apnea Denies history of Aspiration, Asthma, Chronic Obstructive Pulmonary Disease (COPD), Pneumothorax, Tuberculosis Cardiovascular Denies history of Arrhythmia, Congestive Heart Failure, Coronary Artery Disease, Deep Vein Thrombosis, Hypertension, Hypotension, Myocardial Infarction, Peripheral Arterial Disease, Peripheral Venous Disease, Phlebitis, Vasculitis Gastrointestinal Denies history of Cirrhosis , Colitis, Crohn s, Hepatitis A, Hepatitis B, Hepatitis C Endocrine Denies history of Type I Diabetes, Type II Diabetes Genitourinary Denies history of End Stage Renal Disease Immunological Denies history of Lupus Erythematosus, Raynaud s, Scleroderma Integumentary (Skin) Denies history of History of Burn Musculoskeletal Denies history of Gout, Rheumatoid Arthritis, Osteoarthritis, Osteomyelitis Neurologic Denies history of Dementia, Neuropathy, Paraplegia, Seizure Disorder Psychiatric Patient has history of Confinement Anxiety Denies history of Anorexia/bulimia Review of Systems (ROS) Constitutional Symptoms (General Health) Denies complaints or symptoms of Fatigue, Fever, Chills, Marked  Weight Change. Eyes Complains or has symptoms of Dry Eyes, Glasses / Contacts - glasses. Denies complaints or symptoms of Vision Changes. Ear/Nose/Mouth/Throat The patient has no complaints or symptoms.  Hematologic/Lymphatic Denies complaints or symptoms of Bleeding / Clotting Disorders, Human Immunodeficiency Virus. Respiratory Denies complaints or symptoms of Chronic or frequent coughs, Shortness of Breath. Cardiovascular Complains or has symptoms of LE edema. Denies complaints or symptoms of Chest pain. Gastrointestinal Denies complaints or symptoms of Frequent diarrhea, Nausea, Vomiting. Endocrine Denies complaints or symptoms of Hepatitis, Thyroid disease, Polydypsia (Excessive Thirst). Genitourinary Denies complaints or symptoms of Kidney failure/ Dialysis, Incontinence/dribbling. Immunological Denies complaints or symptoms of Hives, Itching. Integumentary (Skin) Complains or has symptoms of Wounds. Denies complaints or symptoms of Bleeding or bruising tendency, Breakdown, Swelling. Musculoskeletal Denies complaints or symptoms of Muscle Pain, Muscle Weakness. Neurologic Denies complaints or symptoms of Numbness/parasthesias, Focal/Weakness. Oncologic The patient has no complaints or symptoms. Psychiatric Complains or has symptoms of Anxiety. Denies complaints or symptoms of Claustrophobia. Bolin, Rolf C. ([id number]) Objective Constitutional patient is hypertensive.. pulse regular and within target range for patient.Marland Kitchen respirations regular, non-labored and within target range for patient.Marland Kitchen temperature within target range for patient.. Well-nourished and well-hydrated in no acute distress. Vitals Time Taken: 1:04 AM, Height: 75 in, Source: Stated, Weight: 179 lbs, BMI: 22.4, Temperature: 98.4 F, Pulse: 87 bpm, Respiratory Rate: 20 breaths/min, Blood Pressure: 135/77 mmHg. Eyes conjunctiva clear no eyelid edema noted. pupils equal round and reactive to light and  accommodation. Ears, Nose, Mouth, and Throat no gross abnormality of ear auricles or external auditory canals. normal hearing noted during conversation. mucus membranes moist. Respiratory normal breathing without difficulty. clear to auscultation bilaterally. Cardiovascular regular rate and rhythm with normal S1, S2. 2+ dorsalis pedis/posterior tibialis pulses. 2+ pitting edema of the bilateral lower extremities. Gastrointestinal (GI) soft, non-tender, non-distended, +BS. no ventral hernia noted. Musculoskeletal normal gait and posture. no significant deformity or arthritic changes, no loss or range of motion, no clubbing. Psychiatric this patient is able to make decisions and demonstrates good insight into disease process. Alert and Oriented x 3. pleasant and cooperative. General Notes: Patient's wounds were slough covered today and did required debridement which was performed without complication during the office visit. He did have some discomfort fortunately post debridement the ones that appeared to be somewhat better. There was definitely a much better granulation bed noted. Integumentary (Hair, Skin) Wound #1 status is Open. Original cause of wound was Gradually Appeared. The wound is located on the Left,Proximal,Medial Lower Leg. The wound measures 0.8cm length x 1.1cm width x 0.1cm depth; 0.691cm^2 area and 0.069cm^3 volume. There is Fat Layer (Subcutaneous Tissue) Exposed exposed. There is no tunneling or undermining noted. There is a medium amount of serosanguineous drainage noted. The wound margin is flat and intact. There is small (1-33%) red granulation within the wound bed. There is a large (67-100%) amount of necrotic tissue within the wound bed including Adherent Slough. The periwound skin appearance exhibited: Excoriation, Scarring, Erythema. The periwound skin appearance did not exhibit: Callus, Crepitus, Induration, Rash, Dry/Scaly, Maceration, Atrophie Blanche,  Cyanosis, Ecchymosis, Hemosiderin Staining, Mottled, Pallor, Rubor. The surrounding wound skin color is noted with erythema which is circumferential. Wound #2 status is Open. Original cause of wound was Gradually Appeared. The wound is located on the Left,Midline Lower [place], Jaques C. ([id number]) Leg. The wound measures 2.5cm length x 2.6cm width x 0.1cm depth; 5.105cm^2 area and 0.511cm^3 volume. There is no tunneling or undermining noted. There is a large amount of serosanguineous drainage noted. The wound margin is flat and intact. There is small (1-33%) red granulation within the wound bed. There is a large (67-100%) amount of necrotic tissue within  the wound bed including Adherent Slough. The periwound skin appearance exhibited: Excoriation, Erythema. The periwound skin appearance did not exhibit: Callus, Crepitus, Induration, Rash, Scarring, Dry/Scaly, Maceration, Atrophie Blanche, Cyanosis, Ecchymosis, Hemosiderin Staining, Mottled, Pallor, Rubor. The surrounding wound skin color is noted with erythema which is circumferential. Wound #3 status is Open. Original cause of wound was Gradually Appeared. The wound is located on the Left,Distal,Midline Lower Leg. The wound measures 0.4cm length x 0.4cm width x 0.1cm depth; 0.126cm^2 area and 0.013cm^3 volume. There is no tunneling or undermining noted. There is a small amount of serosanguineous drainage noted. The wound margin is flat and intact. There is medium (34-66%) red granulation within the wound bed. There is a medium (34-66%) amount of necrotic tissue within the wound bed including Adherent Slough. The periwound skin appearance exhibited: Excoriation, Erythema. The periwound skin appearance did not exhibit: Callus, Crepitus, Induration, Rash, Scarring, Dry/Scaly, Maceration, Atrophie Blanche, Cyanosis, Ecchymosis, Hemosiderin Staining, Mottled, Pallor, Rubor. The surrounding wound skin color is noted with erythema which is  circumferential. 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 There was a Skin/Subcutaneous Tissue Debridement ([id number]) debridement with total area of 0.88 sq cm performed by STONE III, Wolfe Camarena E., PA-C. with the following instrument(s): Curette to remove Viable and Non-Viable tissue/material including Fibrin/Slough, Skin, and Subcutaneous after achieving pain control using Other (lidocaIne 4 %). A time out was conducted at 01:41, prior to the start of the procedure. A Minimum amount of bleeding was controlled with Pressure. The procedure was tolerated well with a pain level of 0 throughout and a pain level of 0 following the procedure. Post Debridement Measurements: 0.8cm length x 1.1cm width x 0.1cm depth; 0.069cm^3 volume. Character of Wound/Ulcer Post Debridement is stable. Post procedure Diagnosis Wound #1: Same as Pre-Procedure Wound #2 Pre-procedure diagnosis of Wound #2 is a Lymphedema located on the Left,Midline Lower Leg . There was a Skin/Subcutaneous Tissue Debridement ([id number]) debridement with total area of 6.5 sq cm performed by STONE III, Melissa Pulido E., PA-C. with the following instrument(s): Curette to remove Viable and Non-Viable tissue/material including Fibrin/Slough, Skin, and Subcutaneous after achieving pain control using Other (lidocaIne4 %). A time out was conducted at 01:41, prior to the start of the procedure. A Minimum amount of bleeding was controlled with Pressure. The procedure was tolerated well with a pain level of 0 throughout and a pain level of 0 following the procedure. Post Debridement Measurements: 2.5cm length x 2.6cm width x 0.2cm depth; 1.021cm^3  volume. Character of Wound/Ulcer Post Debridement is stable. Post procedure Diagnosis Wound #2: Same as Pre-Procedure Rasch, Tarrin C. ([id number]) Wound #3 Pre-procedure diagnosis of Wound #3 is a Lymphedema located on the Left,Distal,Midline Lower Leg . There was a Skin/Subcutaneous Tissue Debridement ([id number]) debridement with total area of 0.16 sq cm performed by STONE III, Christean Silvestri E., PA-C. with the following instrument(s): Curette to remove Viable and Non-Viable tissue/material including Fibrin/Slough, Skin, and Subcutaneous after achieving pain control using Other (lidocaIne4 %). A time out was conducted at 01:41, prior to the start of the procedure. A Minimum amount of bleeding was controlled with Pressure. The procedure was tolerated well with a pain level of 0 throughout and a pain level of 0 following the procedure. Post Debridement Measurements: 0.4cm length  x 0.4cm width x 0.2cm depth; 0.025cm^3 volume. Character of Wound/Ulcer Post Debridement is stable. Post procedure Diagnosis Wound #3: Same as Pre-Procedure Plan Wound Cleansing: Wound #1 Left,Proximal,Medial Lower Leg: Clean wound with Normal Saline. Wound #2 Left,Midline Lower Leg: Clean wound with Normal Saline. Wound #3 Left,Distal,Midline Lower Leg: Clean wound with Normal Saline. Anesthetic (add to Medication List): Wound #1 Left,Proximal,Medial Lower Leg: Topical Lidocaine 4% cream applied to wound bed prior to debridement (In Clinic Only). Wound #2 Left,Midline Lower Leg: Topical Lidocaine 4% cream applied to wound bed prior to debridement (In Clinic Only). Wound #3 Left,Distal,Midline Lower Leg: Topical Lidocaine 4% cream applied to wound bed prior to debridement (In Clinic Only). Primary Wound Dressing: Wound #1 Left,Proximal,Medial Lower Leg: Santyl Ointment Wound #2 Left,Midline Lower Leg: Santyl Ointment Wound #3 Left,Distal,Midline Lower Leg: Prisma Ag Secondary Dressing: Wound #1 Left,Proximal,Medial  Lower Leg: Boardered Foam Dressing Wound #2 Left,Midline Lower Leg: Boardered Foam Dressing Wound #3 Left,Distal,Midline Lower Leg: Boardered Foam Dressing Dressing Change Frequency: Wound #1 Left,Proximal,Medial Lower Leg: Change dressing every day. Wound #2 Left,Midline Lower Leg: Change dressing every day. Wound #3 Left,Distal,Midline Lower Leg: Change dressing every day. Follow-up Appointments: Wound #1 Left,Proximal,Medial Lower Leg: Return Appointment in 1 week. Wound #2 Left,Midline Lower Leg: Younis, Jaiel C. ([id number]) Return Appointment in 1 week. Wound #3 Left,Distal,Midline Lower Leg: Return Appointment in 1 week. Edema Control: Patient to wear own compression stockings The following medication(s) was prescribed: Santyl topical 250 unit/gram ointment ointment topical applied nickel thick daily to the wound bed then cover with dressing as directed starting 06/22/2017 doxycycline hyclate oral 100 mg capsule 1 1 capsule oral by mouth 2 times a day for 10 days starting 06/22/2017 At this point I'm going to recommend that we initiate treatment with the Prisma for the left distal midline lower leg ulcer. The other two ulcers I suggested Santyl for today. Patient was in agreement with the plan. I also did go ahead and send in a prescription for doxycycline for the patient due to the likely infection that appeared to still be present at this point. He does tell me that he's been doing better and the infection does seem to be clearing with the current treatment. We will see were things stand in time. Please see above for specific wound care orders. We will see patient for re-evaluation in 1 week(s) here in the clinic. If anything worsens or changes patient will contact our office for additional recommendations. Electronic Signature(s) Signed: 06/29/2017 1:06:23 AM By: Worthy Keeler PA-C Entered By: Worthy Keeler on 06/29/2017 01:05:46 Raz, Dionis CMarland Kitchen  ([id number]) -------------------------------------------------------------------------------- ROS/PFSH Details Patient Name: Dungan, Humza C. Date of Service: 06/19/2017 12:30 PM Medical Record Number: [id number] Patient Account Number: [id number] Date of Birth/Sex: [birth year] (57 y.o. Male) Treating RN: Roger Shelter Primary Care Provider: Nelson Chimes Other Clinician: Referring Provider: Nelson Chimes Treating Provider/Extender: STONE III, Marne Meline Weeks in Treatment: 0 Information Obtained From Patient Wound History Do you currently have one or more open woundso Yes How many open wounds do you currently haveo 3 Approximately how long have you had your woundso several months How have you been treating your wound(s) until nowo xeroform gauze Has your wound(s) ever healed and then re-openedo No Have you had any lab work done in the past montho No Have you tested positive for an antibiotic resistant organism (MRSA, VRE)o No Have you tested positive for osteomyelitis (bone infection)o No Have you had any tests for circulation on your legso No Constitutional Symptoms (  General Health) Complaints and Symptoms: Negative for: Fatigue; Fever; Chills; Marked Weight Change Eyes Complaints and Symptoms: Positive for: Dry Eyes; Glasses / Contacts - glasses Negative for: Vision Changes Medical History: Negative for: Cataracts; Glaucoma; Optic Neuritis Hematologic/Lymphatic Complaints and Symptoms: Negative for: Bleeding / Clotting Disorders; Human Immunodeficiency Virus Medical History: Negative for: Anemia; Hemophilia; Human Immunodeficiency Virus; Lymphedema; Sickle Cell Disease Respiratory Complaints and Symptoms: Negative for: Chronic or frequent coughs; Shortness of Breath Medical History: Positive for: Sleep Apnea Negative for: Aspiration; Asthma; Chronic Obstructive Pulmonary Disease (COPD); Pneumothorax; Tuberculosis Cardiovascular Complaints and Symptoms: Positive for: LE  edema Negative for: Chest pain Madaris, Eliyah C. ([id number]) Medical History: Negative for: Arrhythmia; Congestive Heart Failure; Coronary Artery Disease; Deep Vein Thrombosis; Hypertension; Hypotension; Myocardial Infarction; Peripheral Arterial Disease; Peripheral Venous Disease; Phlebitis; Vasculitis Gastrointestinal Complaints and Symptoms: Negative for: Frequent diarrhea; Nausea; Vomiting Medical History: Negative for: Cirrhosis ; Colitis; Crohnos; Hepatitis A; Hepatitis B; Hepatitis C Endocrine Complaints and Symptoms: Negative for: Hepatitis; Thyroid disease; Polydypsia (Excessive Thirst) Medical History: Negative for: Type I Diabetes; Type II Diabetes Genitourinary Complaints and Symptoms: Negative for: Kidney failure/ Dialysis; Incontinence/dribbling Medical History: Negative for: End Stage Renal Disease Immunological Complaints and Symptoms: Negative for: Hives; Itching Medical History: Negative for: Lupus Erythematosus; Raynaudos; Scleroderma Integumentary (Skin) Complaints and Symptoms: Positive for: Wounds Negative for: Bleeding or bruising tendency; Breakdown; Swelling Medical History: Negative for: History of Burn Musculoskeletal Complaints and Symptoms: Negative for: Muscle Pain; Muscle Weakness Medical History: Negative for: Gout; Rheumatoid Arthritis; Osteoarthritis; Osteomyelitis Neurologic Complaints and Symptoms: Negative for: Numbness/parasthesias; Focal/Weakness Medical History: JAEDEN, MESSER ([id number]) Negative for: Dementia; Neuropathy; Paraplegia; Seizure Disorder Psychiatric Complaints and Symptoms: Positive for: Anxiety Negative for: Claustrophobia Medical History: Positive for: Confinement Anxiety Negative for: Anorexia/bulimia Ear/Nose/Mouth/Throat Complaints and Symptoms: No Complaints or Symptoms Oncologic Complaints and Symptoms: No Complaints or Symptoms Immunizations Pneumococcal Vaccine: Received Pneumococcal Vaccination:  No Implantable Devices Family and Social History Cancer: Yes - Father; Diabetes: No; Heart Disease: Yes - Father,Siblings; Hereditary Spherocytosis: No; Hypertension: No; Kidney Disease: No; Lung Disease: No; Seizures: No; Stroke: No; Thyroid Problems: No; Tuberculosis: No; Current some day smoker; Marital Status - Separated; Alcohol Use: Rarely; Drug Use: No History; Caffeine Use: Daily; Financial Concerns: No; Food, Clothing or Shelter Needs: No; Support System Lacking: No; Transportation Concerns: No; Advanced Directives: No; Patient does not want information on Advanced Directives; Do not resuscitate: No; Living Will: No; Medical Power of Attorney: No Electronic Signature(s) Signed: 06/19/2017 5:14:03 PM By: Roger Shelter Signed: 06/22/2017 11:19:49 AM By: Worthy Keeler PA-C Entered By: Roger Shelter on 06/19/2017 12:57:09 Blossom, Jeison C. ([id number]) -------------------------------------------------------------------------------- SuperBill Details Patient Name: Coffin, Derrell C. Date of Service: 06/19/2017 Medical Record Number: [id number] Patient Account Number: [id number] Date of Birth/Sex: Nov 26, 1960 (57 y.o. Male) Treating RN: Roger Shelter Primary Care Provider: Nelson Chimes Other Clinician: Referring Provider: Nelson Chimes Treating Provider/Extender: [name], Pattie Flaharty Weeks in Treatment: 0 Diagnosis Coding ICD-10 Codes Code Description I89.0 Lymphedema, not elsewhere classified L97.822 Non-pressure chronic ulcer of other part of left lower leg with fat layer exposed E66.09 Other obesity due to excess calories Facility Procedures CPT4 Code Description: [id number] 99213 - WOUND CARE VISIT-LEV 3 EST PT Modifier: Quantity: 1 CPT4 Code Description: [id number] 11042 - DEB SUBQ TISSUE 20 SQ CM/< ICD-10 Diagnosis Description L97.822 Non-pressure chronic ulcer of other part of left lower leg with Modifier: fat layer expos Quantity: 1 ed Physician Procedures CPT4 Code  Description: [id number] WC PHYS LEVEL 3 o NEW PT ICD-10 Diagnosis Description I89.0 Lymphedema,  not elsewhere classified L97.822 Non-pressure chronic ulcer of other part of left lower leg with E66.09 Other obesity due to excess calories Modifier: 25 fat layer expos Quantity: 1 ed CPT4 Code Description: [id number] 18299 - WC PHYS SUBQ TISS 20 SQ CM ICD-10 Diagnosis Description L97.822 Non-pressure chronic ulcer of other part of left lower leg with Modifier: fat layer expos Quantity: 1 ed Electronic Signature(s) Signed: 06/22/2017 11:19:49 AM By: Worthy Keeler PA-C Entered By: Worthy Keeler on 06/22/2017 08:03:53

## 2017-06-25 ENCOUNTER — Encounter: Payer: 59 | Admitting: Nurse Practitioner

## 2017-06-25 DIAGNOSIS — I890 Lymphedema, not elsewhere classified: Secondary | ICD-10-CM | POA: Diagnosis not present

## 2017-06-26 NOTE — Progress Notes (Signed)
REICHEN, HUTZLER ([id number]) Visit Report for 06/25/2017 Chief Complaint Document Details Patient Name: Dillenburg, Ewell C. Date of Service: 06/25/2017 9:00 AM Medical Record Number: [id number] Patient Account Number: [id number] Date of Birth/Sex: [birth year] (57 y.o. Male) Treating RN: Montey Hora Primary Care Provider: Nelson Chimes Other Clinician: Referring Provider: Nelson Chimes Treating Provider/Extender: Cathie Olden in Treatment: 0 Information Obtained from: Patient Chief Complaint He is here in follow up for lle venous ulcers Electronic Signature(s) Signed: 06/25/2017 4:11:02 PM By: Lawanda Cousins Entered By: Lawanda Cousins on 06/25/2017 09:26:36 Mogan, Kingstin C. ([id number]) -------------------------------------------------------------------------------- Debridement Details Patient Name: Brott, Sinjin C. Date of Service: 06/25/2017 9:00 AM Medical Record Number: [id number] Patient Account Number: [id number] Date of Birth/Sex: 20-Oct-1960 (57 y.o. Male) Treating RN: Montey Hora Primary Care Provider: Nelson Chimes Other Clinician: Referring Provider: Nelson Chimes Treating Provider/Extender: Cathie Olden in Treatment: 0 Debridement Performed for Wound #2 Left,Midline Lower Leg Assessment: Performed By: Physician Lawanda Cousins, NP Debridement: Debridement Pre-procedure Verification/Time Yes - 09:16 Out Taken: Start Time: 09:16 Pain Control: Lidocaine 4% Topical Solution Level: Skin/Subcutaneous Tissue Total Area Debrided (L x W): 2.6 (cm) x 2.7 (cm) = 7.02 (cm) Tissue and other material Viable, Non-Viable, Fibrin/Slough, Subcutaneous debrided: Instrument: Curette Bleeding: Minimum Hemostasis Achieved: Pressure End Time: 09:18 Procedural Pain: 0 Post Procedural Pain: 0 Response to Treatment: Procedure was tolerated well Post Debridement Measurements of Total Wound Length: (cm) 2.6 Width: (cm) 2.7 Depth: (cm) 0.2 Volume: (cm) 1.103 Character of  Wound/Ulcer Post Debridement: Improved Post Procedure Diagnosis Same as Pre-procedure Electronic Signature(s) Signed: 06/25/2017 3:59:18 PM By: Montey Hora Signed: 06/25/2017 4:11:02 PM By: Lawanda Cousins Entered By: Montey Hora on 06/25/2017 09:20:03 Demarcus, Tyrone C. ([id number]) -------------------------------------------------------------------------------- Debridement Details Patient Name: Rafalski, Elian C. Date of Service: 06/25/2017 9:00 AM Medical Record Number: [id number] Patient Account Number: [id number] Date of Birth/Sex: 1961/02/28 (57 y.o. Male) Treating RN: Montey Hora Primary Care Provider: Nelson Chimes Other Clinician: Referring Provider: Nelson Chimes Treating Provider/Extender: Cathie Olden in Treatment: 0 Debridement Performed for Wound #1 Left,Proximal,Medial Lower Leg Assessment: Performed By: Physician Lawanda Cousins, NP Debridement: Debridement Pre-procedure Verification/Time Yes - 09:18 Out Taken: Start Time: 09:18 Pain Control: Lidocaine 4% Topical Solution Level: Skin/Subcutaneous Tissue Total Area Debrided (L x W): 1 (cm) x 1.1 (cm) = 1.1 (cm) Tissue and other material Viable, Non-Viable, Fibrin/Slough, Subcutaneous debrided: Instrument: Curette Bleeding: Minimum Hemostasis Achieved: Pressure End Time: 09:19 Procedural Pain: 0 Post Procedural Pain: 0 Response to Treatment: Procedure was tolerated well Post Debridement Measurements of Total Wound Length: (cm) 1 Width: (cm) 1.1 Depth: (cm) 0.3 Volume: (cm) 0.259 Character of Wound/Ulcer Post Debridement: Improved Post Procedure Diagnosis Same as Pre-procedure Electronic Signature(s) Signed: 06/25/2017 3:59:18 PM By: Montey Hora Signed: 06/25/2017 4:11:02 PM By: Lawanda Cousins Entered By: Montey Hora on 06/25/2017 09:21:01 Rosete, Dani C. ([id number]) -------------------------------------------------------------------------------- HPI Details Patient Name: Sidell, Devanta C. Date  of Service: 06/25/2017 9:00 AM Medical Record Number: [id number] Patient Account Number: [id number] Date of Birth/Sex: June 08, 1960 (57 y.o. Male) Treating RN: Montey Hora Primary Care Provider: Nelson Chimes Other Clinician: Referring Provider: Nelson Chimes Treating Provider/Extender: Cathie Olden in Treatment: 0 History of Present Illness HPI Description: 06/25/17-he is here in follow-up evaluation for left lower extremity, medial malleolus, venous ulcers. He is compliant with compression stocking where although his compression stocking does not fit appropriately in length. He has been advised to contact elastic therapy, who measured and provided his current stockings, regarding a more appropriate fitted stocking. He is voicing no complaints or concerns,  tolerated debridement. There is improvement in appearance, no significant change in measurements. We will continue with Santyl ointment, will apply to all 3 wounds. He will follow-up next week Electronic Signature(s) Signed: 06/25/2017 4:11:02 PM By: Lawanda Cousins Entered By: Lawanda Cousins on 06/25/2017 09:29:07 Bujak, Camilo C. ([id number]) -------------------------------------------------------------------------------- Physician Orders Details Patient Name: Maus, Christohper C. Date of Service: 06/25/2017 9:00 AM Medical Record Number: [id number] Patient Account Number: [id number] Date of Birth/Sex: Jul 01, 1960 (57 y.o. Male) Treating RN: Montey Hora Primary Care Provider: Nelson Chimes Other Clinician: Referring Provider: Nelson Chimes Treating Provider/Extender: Cathie Olden in Treatment: 0 Verbal / Phone Orders: No Diagnosis Coding Wound Cleansing Wound #1 Left,Proximal,Medial Lower Leg o Clean wound with Normal Saline. o May Shower, gently pat wound dry prior to applying new dressing. Wound #2 Left,Midline Lower Leg o Clean wound with Normal Saline. o May Shower, gently pat wound dry prior to  applying new dressing. Wound #3 Left,Distal,Midline Lower Leg o Clean wound with Normal Saline. o May Shower, gently pat wound dry prior to applying new dressing. Anesthetic (add to Medication List) Wound #1 Left,Proximal,Medial Lower Leg o Topical Lidocaine 4% cream applied to wound bed prior to debridement (In Clinic Only). Wound #2 Left,Midline Lower Leg o Topical Lidocaine 4% cream applied to wound bed prior to debridement (In Clinic Only). Wound #3 Left,Distal,Midline Lower Leg o Topical Lidocaine 4% cream applied to wound bed prior to debridement (In Clinic Only). Primary Wound Dressing Wound #1 Left,Proximal,Medial Lower Leg o Santyl Ointment Wound #2 Left,Midline Lower Leg o Santyl Ointment Wound #3 Left,Distal,Midline Lower Leg o Santyl Ointment Secondary Dressing Wound #1 Left,Proximal,Medial Lower Leg o Boardered Foam Dressing Wound #2 Left,Midline Lower Leg o Boardered Foam Dressing Wound #3 Left,Distal,Midline Lower Leg o Boardered Foam Dressing Dressing Change Frequency Stuckey, Sigismund C. ([id number]) Wound #1 Left,Proximal,Medial Lower Leg o Change dressing every day. Wound #2 Left,Midline Lower Leg o Change dressing every day. Wound #3 Left,Distal,Midline Lower Leg o Change dressing every day. Follow-up Appointments Wound #1 Left,Proximal,Medial Lower Leg o Return Appointment in 1 week. Wound #2 Left,Midline Lower Leg o Return Appointment in 1 week. Wound #3 Left,Distal,Midline Lower Leg o Return Appointment in 1 week. Edema Control Wound #1 Left,Proximal,Medial Lower Leg o Patient to wear own compression stockings Wound #2 Left,Midline Lower Leg o Patient to wear own compression stockings Wound #3 Left,Distal,Midline Lower Leg o Patient to wear own compression stockings Electronic Signature(s) Signed: 06/25/2017 4:11:02 PM By: Lawanda Cousins Entered By: Lawanda Cousins on 06/25/2017 09:29:53 Hensler, Ever C.  ([id number]) -------------------------------------------------------------------------------- Problem List Details Patient Name: Groom, Izaiah C. Date of Service: 06/25/2017 9:00 AM Medical Record Number: [id number] Patient Account Number: [id number] Date of Birth/Sex: [birth year] (57 y.o. 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 ([id number]) -------------------------------------------------------------------------------- Progress Note Details Patient Name: Radney, Jonathyn C. Date of Service: 06/25/2017 9:00 AM Medical Record Number: [id number] Patient Account Number: [id number] Date of Birth/Sex: 02/18/1961 (57 y.o. Male) Treating RN: Montey Hora Primary Care Provider: Nelson Chimes Other Clinician: Referring Provider: Nelson Chimes Treating Provider/Extender: Rene Kocher,  Danaysia Rader Weeks in Treatment: 0 Subjective Chief Complaint Information obtained from Patient He is here in follow up for lle venous ulcers History of Present Illness (HPI) 06/25/17-he is here in follow-up evaluation for left lower extremity, medial malleolus, venous ulcers. He is compliant with compression stocking where although his compression stocking does not fit appropriately in length. He has been advised to contact elastic therapy, who measured and provided his current stockings, regarding a more appropriate  fitted stocking. He is voicing no complaints or concerns, tolerated debridement. There is improvement in appearance, no significant change in measurements. We will continue with Santyl ointment, will apply to all 3 wounds. He will follow-up next week Patient History Information obtained from Patient. Family History Cancer - Father, Heart Disease - Father,Siblings, No family history of Diabetes, Hereditary Spherocytosis, Hypertension, Kidney Disease, Lung Disease, Seizures, Stroke, Thyroid Problems, Tuberculosis. Social History Current some day smoker, Marital Status - Separated, Alcohol Use - Rarely, Drug Use - No History, Caffeine Use - Daily. Objective Constitutional Vitals Time Taken: 8:59 AM, Height: 75 in, Weight: 179 lbs, BMI: 22.4, Temperature: 98.1 F, Pulse: 102 bpm, Respiratory Rate: 18 breaths/min, Blood Pressure: 139/85 mmHg. Integumentary (Hair, Skin) Wound #1 status is Open. Original cause of wound was Gradually Appeared. The wound is located on the Left,Proximal,Medial Lower Leg. The wound measures 1cm length x 1.1cm width x 0.2cm depth; 0.864cm^2 area and 0.173cm^3 volume. There is Fat Layer (Subcutaneous Tissue) Exposed exposed. There is no tunneling or undermining noted. There is a medium amount of serosanguineous drainage noted. The wound margin is flat and intact. There is small (1-33%) red granulation within the wound bed. There is a large (67-100%) amount of necrotic tissue within the wound bed Rylander, Freman C. ([id number]) including Adherent Slough. The periwound skin appearance exhibited: Excoriation, Scarring, Erythema. The periwound skin appearance did not exhibit: Callus, Crepitus, Induration, Rash, Dry/Scaly, Maceration, Atrophie Blanche, Cyanosis, Ecchymosis, Hemosiderin Staining, Mottled, Pallor, Rubor. The surrounding wound skin color is noted with erythema which is circumferential. Wound #2 status is Open. Original cause of wound was Gradually Appeared. The wound  is located on the Left,Midline Lower Leg. The wound measures 2.6cm length x 2.7cm width x 0.1cm depth; 5.513cm^2 area and 0.551cm^3 volume. There is no tunneling or undermining noted. There is a large amount of serosanguineous drainage noted. The wound margin is flat and intact. There is small (1-33%) red granulation within the wound bed. There is a large (67-100%) amount of necrotic tissue within the wound bed including Adherent Slough. The periwound skin appearance exhibited: Excoriation, Erythema. The periwound skin appearance did not exhibit: Callus, Crepitus, Induration, Rash, Scarring, Dry/Scaly, Maceration, Atrophie Blanche, Cyanosis, Ecchymosis, Hemosiderin Staining, Mottled, Pallor, Rubor. The surrounding wound skin color is noted with erythema which is circumferential. Wound #3 status is Open. Original cause of wound was Gradually Appeared. The wound is located on the Left,Distal,Midline Lower Leg. The wound measures 0.5cm length x 0.5cm width x 0.1cm depth; 0.196cm^2 area and 0.02cm^3 volume. There is no tunneling or undermining noted. There is a small amount of serosanguineous drainage noted. The wound margin is flat and intact. There is medium (34-66%) red granulation within the wound bed. There is a medium (34-66%) amount of necrotic tissue within the wound bed including Adherent Slough. The periwound skin appearance exhibited: Excoriation, Erythema. The periwound skin appearance did not exhibit: Callus, Crepitus, Induration, Rash, Scarring, Dry/Scaly, Maceration, Atrophie Blanche, Cyanosis, Ecchymosis, Hemosiderin Staining, Mottled, Pallor, Rubor. The surrounding wound skin color is noted with erythema which is circumferential.  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 There was a Skin/Subcutaneous Tissue Debridement (31517-61607) debridement with total area of 1.1 sq cm performed by Lawanda Cousins, NP. with the following instrument(s): Curette to remove Viable and Non-Viable tissue/material including Fibrin/Slough and Subcutaneous after achieving pain control using Lidocaine 4% Topical Solution. A time out was conducted at 09:18, prior to the start of the procedure. A Minimum amount of bleeding was controlled with Pressure. The procedure was tolerated well with a pain level of 0 throughout and a pain level of 0 following the procedure. Post Debridement Measurements: 1cm length x 1.1cm width x 0.3cm depth; 0.259cm^3 volume. Character of Wound/Ulcer Post Debridement is improved. Post procedure Diagnosis Wound #1: Same as Pre-Procedure Wound #2 Pre-procedure diagnosis of Wound #2 is a Lymphedema located on the Left,Midline Lower Leg . There was a Skin/Subcutaneous Tissue Debridement ([id number]) debridement with total area of 7.02 sq cm performed by Lawanda Cousins, NP. with the following instrument(s): Curette to remove Viable and Non-Viable tissue/material including Fibrin/Slough Landstrom, Brant C. ([id number]) and Subcutaneous after achieving pain control using Lidocaine 4% Topical Solution. A time out was conducted at 09:16, prior to the start of the procedure. A Minimum amount of bleeding was controlled with Pressure. The procedure was tolerated well with a pain level of 0 throughout and a pain level of 0 following the procedure. Post Debridement Measurements: 2.6cm length x 2.7cm width x 0.2cm depth; 1.103cm^3 volume. Character of Wound/Ulcer Post Debridement is improved. Post procedure Diagnosis Wound #2: Same as Pre-Procedure Plan Wound Cleansing: Wound #1 Left,Proximal,Medial Lower Leg: Clean wound with Normal Saline. May Shower, gently pat wound dry prior to applying new  dressing. Wound #2 Left,Midline Lower Leg: Clean wound with Normal Saline. May Shower, gently pat wound dry prior to applying new dressing. Wound #3 Left,Distal,Midline Lower Leg: Clean wound with Normal Saline. May Shower, gently pat wound dry prior to applying new dressing. Anesthetic (add to Medication List): Wound #1 Left,Proximal,Medial Lower Leg: Topical Lidocaine 4% cream applied to wound bed prior to debridement (In Clinic Only). Wound #2 Left,Midline Lower Leg: Topical Lidocaine 4% cream applied to wound bed prior to debridement (In Clinic Only). Wound #3 Left,Distal,Midline Lower Leg: Topical Lidocaine 4% cream applied to wound bed prior to debridement (In Clinic Only). Primary Wound Dressing: Wound #1 Left,Proximal,Medial Lower Leg: Santyl Ointment Wound #2 Left,Midline Lower Leg: Santyl Ointment Wound #3 Left,Distal,Midline Lower Leg: Santyl Ointment Secondary Dressing: Wound #1 Left,Proximal,Medial Lower Leg: Boardered Foam Dressing Wound #2 Left,Midline Lower Leg: Boardered Foam Dressing Wound #3 Left,Distal,Midline Lower Leg: Boardered Foam Dressing Dressing Change Frequency: Wound #1 Left,Proximal,Medial Lower Leg: Change dressing every day. Wound #2 Left,Midline Lower Leg: Change dressing every day. Wound #3 Left,Distal,Midline Lower Leg: Change dressing every day. Follow-up Appointments: Wound #1 Left,Proximal,Medial Lower Leg: Return Appointment in 1 week. Wound #2 Left,Midline Lower Leg: Return Appointment in 1 week. Wound #3 Left,Distal,Midline Lower Leg: Return Appointment in 1 week. ELLIJAH, LEFFEL ([id number]) Edema Control: Wound #1 Left,Proximal,Medial Lower Leg: Patient to wear own compression stockings Wound #2 Left,Midline Lower Leg: Patient to wear own compression stockings Wound #3 Left,Distal,Midline Lower Leg: Patient to wear own compression stockings 1. Santyl to all wounds, continue with compression stocking 2. Contact elastic therapy  regarding more appropriate fitting stocking 3. Follow-up next week Electronic Signature(s) Signed: 06/25/2017 4:11:02 PM By: Lawanda Cousins Entered By: Lawanda Cousins on 06/25/2017 09:30:22 Kildow, Alma C. ([id number]) -------------------------------------------------------------------------------- ROS/PFSH Details Patient Name: Rought, English C. Date of Service: 06/25/2017 9:00 AM Medical Record Number: [id number] Patient Account Number: [id number] Date of Birth/Sex: 1960/06/08 (57 y.o. Male) Treating RN: Montey Hora Primary Care Provider: Nelson Chimes Other Clinician: Referring Provider: Nelson Chimes Treating Provider/Extender: Cathie Olden in Treatment: 0 Information Obtained From Patient Wound History Do you currently have one or more open woundso Yes How many open wounds do you currently haveo 3 Approximately how long have you had your woundso several months How have you been treating your wound(s) until nowo xeroform gauze Has your wound(s) ever healed and then re-openedo No Have you had any lab work done in the past montho No Have you tested positive for an antibiotic resistant organism (MRSA, VRE)o No Have you tested positive for osteomyelitis (bone infection)o No Have you had any tests for circulation on your legso No Eyes Medical History: Negative for: Cataracts; Glaucoma; Optic Neuritis Hematologic/Lymphatic Medical History: Negative for: Anemia; Hemophilia; Human Immunodeficiency Virus; Lymphedema; Sickle Cell Disease Respiratory Medical History: Positive for: Sleep Apnea Negative for: Aspiration; Asthma; Chronic Obstructive Pulmonary Disease (COPD); Pneumothorax; Tuberculosis Cardiovascular Medical History: Negative for: Arrhythmia; Congestive Heart Failure; Coronary Artery Disease; Deep Vein Thrombosis; Hypertension; Hypotension; Myocardial Infarction; Peripheral Arterial Disease; Peripheral Venous Disease; Phlebitis; Vasculitis Gastrointestinal Medical  History: Negative for: Cirrhosis ; Colitis; Crohnos; Hepatitis A; Hepatitis B; Hepatitis C Endocrine Medical History: Negative for: Type I Diabetes; Type II Diabetes Genitourinary Medical History: Negative for: End Stage Renal Disease Theys, Isaish C. ([id number]) Immunological Medical History: Negative for: Lupus Erythematosus; Raynaudos; Scleroderma Integumentary (Skin) Medical History: Negative for: History of Burn Musculoskeletal Medical History: Negative for: Gout; Rheumatoid Arthritis; Osteoarthritis; Osteomyelitis Neurologic Medical History: Negative for: Dementia; Neuropathy; Paraplegia; Seizure Disorder Psychiatric Medical History: Positive for: Confinement Anxiety Negative for: Anorexia/bulimia Immunizations Pneumococcal Vaccine: Received Pneumococcal Vaccination: No Implantable Devices Family and Social History Cancer: Yes - Father; Diabetes: No; Heart Disease: Yes - Father,Siblings; Hereditary Spherocytosis: No; Hypertension: No; Kidney Disease: No; Lung Disease: No; Seizures: No; Stroke: No; Thyroid Problems: No; Tuberculosis: No; Current some day smoker; Marital Status - Separated; Alcohol Use: Rarely; Drug Use: No History; Caffeine Use: Daily; Financial Concerns: No; Food, Clothing or Shelter Needs: No; Support System Lacking: No; Transportation Concerns: No; Advanced Directives: No; Patient does not want information on Advanced Directives; Do not resuscitate: No; Living Will: No; Medical Power of Attorney: No Physician Affirmation I have reviewed and agree with the above information. Electronic Signature(s) Signed: 06/25/2017 3:59:18 PM By: Montey Hora Signed: 06/25/2017 4:11:02 PM By: Lawanda Cousins Entered By: Lawanda Cousins on 06/25/2017 09:29:26 Mccleese, Ardean Loletha Grayer ([id number]) -------------------------------------------------------------------------------- SuperBill Details Patient Name: Stantz, Jourdan C. Date of Service: 06/25/2017 Medical Record Number:  [id number] Patient Account Number: [id number] Date of Birth/Sex: 10-21-1960 (57 y.o. Male) Treating RN: Montey Hora Primary Care Provider: Nelson Chimes Other Clinician: Referring Provider: Nelson Chimes Treating Provider/Extender: Cathie Olden in Treatment: 0 Diagnosis Coding ICD-10 Codes Code Description I89.0 Lymphedema, not elsewhere classified L97.822 Non-pressure chronic ulcer of other part of left lower leg with fat layer exposed E66.09 Other obesity due to excess calories Facility Procedures CPT4 Code Description: [id number] 11042 - DEB SUBQ TISSUE 20 SQ CM/< ICD-10 Diagnosis Description L97.822 Non-pressure chronic ulcer of other part of left lower leg with I89.0 Lymphedema, not elsewhere classified Modifier: fat layer expos Quantity: 1 ed  Physician Procedures CPT4 Code Description: [id number] 11042 - WC PHYS SUBQ TISS 20 SQ CM ICD-10 Diagnosis Description L97.822 Non-pressure chronic ulcer of other part of left lower leg with I89.0 Lymphedema, not elsewhere classified Modifier: fat layer expos Quantity: 1 ed Electronic Signature(s) Signed: 06/25/2017 4:11:02 PM By: Lawanda Cousins Entered By: Lawanda Cousins on 06/25/2017 09:30:34

## 2017-06-26 NOTE — Progress Notes (Signed)
[name], [name] ([id number]) Visit Report for 06/25/2017 Arrival Information Details Patient Name: [name], [name] Date of Service: 06/25/2017 9:00 AM Medical Record Number: [id number] Patient Account Number: [id number] Date of Birth/Sex: 10/29/1960 (57 y.o. Male) Treating RN: Montey Hora Primary Care Jaine Estabrooks: Nelson Chimes Other Clinician: Referring Robyn Nohr: Nelson Chimes Treating Olan Kurek/Extender: Cathie Olden in Treatment: 0 Visit Information History Since Last Visit Added or deleted any medications: No Patient Arrived: Ambulatory Any new allergies or adverse reactions: No Arrival Time: 08:58 Had a fall or experienced change in No Accompanied By: self activities of daily living that may affect Transfer Assistance: None risk of falls: Patient Identification Verified: Yes Signs or symptoms of abuse/neglect since last visito No Secondary Verification Process Completed: Yes Hospitalized since last visit: No Has Dressing in Place as Prescribed: Yes Pain Present Now: No Electronic Signature(s) Signed: 06/25/2017 3:59:18 PM By: Montey Hora Entered By: Montey Hora on 06/25/2017 08:58:37 [name], [name] ([id number]) -------------------------------------------------------------------------------- Encounter Discharge Information Details Patient Name: [name], [name] Date of Service: 06/25/2017 9:00 AM Medical Record Number: [id number] Patient Account Number: [id number] Date of Birth/Sex: [birth year] (57 y.o. Male) Treating RN: Montey Hora Primary Care Lanetta Figuero: Nelson Chimes Other Clinician: Referring Moosa Bueche: Nelson Chimes Treating Bralen Wiltgen/Extender: Cathie Olden in Treatment: 0 Encounter Discharge Information Items Discharge Pain Level: 0 Discharge Condition: Stable Ambulatory Status: Ambulatory Discharge Destination: Home Transportation: Private Auto Accompanied By: self Schedule Follow-up Appointment: Yes Medication Reconciliation completed  and No provided to Patient/Care Nashley Cordoba: Provided on Clinical Summary of Care: 06/25/2017 Form Type Recipient Paper Patient LF Electronic Signature(s) Signed: 06/25/2017 10:15:07 AM By: Montey Hora Entered By: Montey Hora on 06/25/2017 10:15:07 [name], [name] -------------------------------------------------------------------------------- Lower Extremity Assessment Details Patient Name: [name], [name] Date of Service: 06/25/2017 9:00 AM Medical Record Number: [id number] Patient Account Number: [id number] Date of Birth/Sex: 1961/01/04 (57 y.o. Male) Treating RN: Montey Hora Primary Care Loralei Radcliffe: Nelson Chimes Other Clinician: Referring Cynthis Purington: Nelson Chimes Treating Darlen Gledhill/Extender: Cathie Olden in Treatment: 0 Edema Assessment Assessed: [Left: No] [Right: No] [Left: Edema] [Right: :] Calf Left: Right: Point of Measurement: 37 cm From Medial Instep 44.5 cm cm Ankle Left: Right: Point of Measurement: 12 cm From Medial Instep 30.4 cm cm Vascular Assessment Pulses: Dorsalis Pedis Palpable: [Left:Yes] Posterior Tibial Extremity colors, hair growth, and conditions: Extremity Color: [Left:Hyperpigmented] Hair Growth on Extremity: [Left:No] Temperature of Extremity: [Left:Warm] Capillary Refill: [Left:< 3 seconds] Electronic Signature(s) Signed: 06/25/2017 3:59:18 PM By: Montey Hora Entered By: Montey Hora on 06/25/2017 09:16:20 [name], [name] ([id number]) -------------------------------------------------------------------------------- Multi Wound Chart Details Patient Name: [name], [name] Date of Service: 06/25/2017 9:00 AM Medical Record Number: [id number] Patient Account Number: [id number] Date of Birth/Sex: 10-19-1960 (57 y.o. Male) Treating RN: Montey Hora Primary Care Jennessy Sandridge: Nelson Chimes Other Clinician: Referring Tylena Prisk: Nelson Chimes Treating Erhard Senske/Extender: Cathie Olden in Treatment: 0 Vital  Signs Height(in): 75 Pulse(bpm): 102 Weight(lbs): 179 Blood Pressure(mmHg): 139/85 Body Mass Index(BMI): 22 Temperature(F): 98.1 Respiratory Rate 18 (breaths/min): Photos: [1:No Photos] [2:No Photos] [3:No Photos] Wound Location: [1:Left Lower Leg - Medial, Proximal] [2:Left Lower Leg - Midline] [3:Left Lower Leg - Midline, Distal] Wounding Event: [1:Gradually Appeared] [2:Gradually Appeared] [3:Gradually Appeared] Primary Etiology: [1:Lymphedema] [2:Lymphedema] [3:Lymphedema] Comorbid History: [1:Sleep Apnea, Confinement Anxiety] [2:Sleep Apnea, Confinement Anxiety] [3:Sleep Apnea, Confinement Anxiety] Date Acquired: [1:01/17/2017] [2:01/17/2017] [3:01/17/2017] Weeks of Treatment: [1:0] [2:0] [3:0] Wound Status: [1:Open] [2:Open] [3:Open] Measurements L x W x D [1:1x1.1x0.2] [2:2.6x2.7x0.1] [3:0.5x0.5x0.1] (cm) Area (cm) : [1:0.864] [2:5.513] [3:0.196] Volume (cm) : [1:0.173] [2:0.551] [3:0.02] %  Reduction in Area: [1:-25.00%] [2:-8.00%] [3:-55.60%] % Reduction in Volume: [1:-150.70%] [2:-7.80%] [3:-53.80%] Classification: [1:Full Thickness Without Exposed Support Structures] [2:Full Thickness Without Exposed Support Structures] [3:Partial Thickness] Exudate Amount: [1:Medium] [2:Large] [3:Small] Exudate Type: [1:Serosanguineous] [2:Serosanguineous] [3:Serosanguineous] Exudate Color: [1:red, brown] [2:red, brown] [3:red, brown] Wound Margin: [1:Flat and Intact] [2:Flat and Intact] [3:Flat and Intact] Granulation Amount: [1:Small (1-33%)] [2:Small (1-33%)] [3:Medium (34-66%)] Granulation Quality: [1:Red] [2:Red] [3:Red] Necrotic Amount: [1:Large (67-100%)] [2:Large (67-100%)] [3:Medium (34-66%)] Exposed Structures: [1:Fat Layer (Subcutaneous Tissue) Exposed: Yes Fascia: No Tendon: No Muscle: No Joint: No Bone: No] [2:Fascia: No Fat Layer (Subcutaneous Tissue) Exposed: No Tendon: No Muscle: No Joint: No Bone: No] [3:Fascia: No Fat Layer (Subcutaneous Tissue)  Exposed: No Tendon:  No Muscle: No Joint: No Bone: No] Epithelialization: [1:None] [2:None] [3:None] Debridement: [1:Debridement (11042-11047)] [2:Debridement ([id number])] [3:N/A] Pre-procedure [1:09:18] [2:09:16] [3:N/A] Verification/Time Out Taken: Pain Control: [1:Lidocaine 4% Topical Solution Lidocaine 4% Topical Solution N/A] Tissue Debrided: Fibrin/Slough, Subcutaneous Fibrin/Slough, Subcutaneous N/A Level: Skin/Subcutaneous Tissue Skin/Subcutaneous Tissue N/A Debridement Area (sq cm): 1.1 7.02 N/A Instrument: Curette Curette N/A Bleeding: Minimum Minimum N/A Hemostasis Achieved: Pressure Pressure N/A Procedural Pain: 0 0 N/A Post Procedural Pain: 0 0 N/A Debridement Treatment Procedure was tolerated well Procedure was tolerated well N/A Response: Post Debridement 1x1.1x0.3 2.6x2.7x0.2 N/A Measurements L x W x D (cm) Post Debridement Volume: 0.259 1.103 N/A (cm) Periwound Skin Texture: Excoriation: Yes Excoriation: Yes Excoriation: Yes Scarring: Yes Induration: No Induration: No Induration: No Callus: No Callus: No Callus: No Crepitus: No Crepitus: No Crepitus: No Rash: No Rash: No Rash: No Scarring: No Scarring: No Periwound Skin Moisture: Maceration: No Maceration: No Maceration: No Dry/Scaly: No Dry/Scaly: No Dry/Scaly: No Periwound Skin Color: Erythema: Yes Erythema: Yes Erythema: Yes Atrophie Blanche: No Atrophie Blanche: No Atrophie Blanche: No Cyanosis: No Cyanosis: No Cyanosis: No Ecchymosis: No Ecchymosis: No Ecchymosis: No Hemosiderin Staining: No Hemosiderin Staining: No Hemosiderin Staining: No Mottled: No Mottled: No Mottled: No Pallor: No Pallor: No Pallor: No Rubor: No Rubor: No Rubor: No Erythema Location: Circumferential Circumferential Circumferential Tenderness on Palpation: No No No Wound Preparation: Ulcer Cleansing: Ulcer Cleansing: Ulcer Cleansing: Rinsed/Irrigated with Saline Rinsed/Irrigated with Saline Rinsed/Irrigated with  Saline Topical Anesthetic Applied: Topical Anesthetic Applied: Topical Anesthetic Applied: Other: lidocaine 4% Other: lisdocaine 4% Other: lidocaine 4% Procedures Performed: Debridement Debridement N/A Treatment Notes Electronic Signature(s) Signed: 06/25/2017 4:11:02 PM By: Lawanda Cousins Entered By: Lawanda Cousins on 06/25/2017 09:25:52 [name], [name] Kitchen ([id number]) -------------------------------------------------------------------------------- [place] Details Patient Name: Nick, Ayuub C. Date of Service: 06/25/2017 9:00 AM Medical Record Number: [id number] Patient Account Number: [id number] Date of Birth/Sex: 06/04/1960 (57 y.o. Male) Treating RN: Montey Hora Primary Care Davell Beckstead: Nelson Chimes Other Clinician: Referring Evalyne Cortopassi: Nelson Chimes Treating Ashaki Frosch/Extender: Cathie Olden in Treatment: 0 Active Inactive ` Orientation to the Wound Care Program Nursing Diagnoses: Knowledge deficit related to the wound healing center program Goals: Patient/caregiver will verbalize understanding of the [facility] Date Initiated: 06/19/2017 Target Resolution Date: 07/10/2017 Goal Status: Active Interventions: Provide education on orientation to the wound center Notes: ` Wound/Skin Impairment Nursing Diagnoses: Impaired tissue integrity Goals: Patient/caregiver will verbalize understanding of skin care regimen Date Initiated: 06/19/2017 Target Resolution Date: 07/10/2017 Goal Status: Active Ulcer/skin breakdown will have a volume reduction of 30% by week 4 Date Initiated: 06/19/2017 Target Resolution Date: 07/10/2017 Goal Status: Active Interventions: Assess patient/caregiver ability to obtain necessary supplies Assess patient/caregiver ability to perform ulcer/skin care regimen upon admission and as needed Assess ulceration(s) every visit Treatment Activities: Skin  care regimen initiated : 06/19/2017 Notes: Electronic  Signature(s) Signed: 06/25/2017 3:59:18 PM By: Georgena Spurling, Mechele Claude ([id number]) Entered By: Montey Hora on 06/25/2017 09:17:01 [name], [name] ([id number]) -------------------------------------------------------------------------------- Pain Assessment Details Patient Name: Brightbill, Rykar C. Date of Service: 06/25/2017 9:00 AM Medical Record Number: [id number] Patient Account Number: [id number] Date of Birth/Sex: [birth year] (57 y.o. Male) Treating RN: Montey Hora Primary Care Cosimo Schertzer: Nelson Chimes Other Clinician: Referring Broedy Osbourne: Nelson Chimes Treating Vardaan Depascale/Extender: Cathie Olden in Treatment: 0 Active Problems Location of Pain Severity and Description of Pain Patient Has Paino No Site Locations Pain Management and Medication Current Pain Management: Goals for Pain Management a little irritated at times Notes Topical or injectable lidocaine is offered to patient for acute pain when surgical debridement is performed. If needed, Patient is instructed to use over the counter pain medication for the following 24-48 hours after debridement. Wound care MDs do not prescribed pain medications. Patient has chronic pain or uncontrolled pain. Patient has been instructed to make an appointment with their Primary Care Physician for pain management. Electronic Signature(s) Signed: 06/25/2017 3:59:18 PM By: Montey Hora Entered By: Montey Hora on 06/25/2017 08:59:19 [name], [name] ([id number]) -------------------------------------------------------------------------------- Patient/Caregiver Education Details Patient Name: Louischarles, Trever C. Date of Service: 06/25/2017 9:00 AM Medical Record Number: [id number] Patient Account Number: [id number] Date of Birth/Gender: [birth year] (57 y.o. Male) Treating RN: Montey Hora Primary Care Physician: Nelson Chimes Other Clinician: Referring Physician: Nelson Chimes Treating Physician/Extender: Cathie Olden in  Treatment: 0 Education Assessment Education Provided To: Patient Education Topics Provided Venous: Handouts: Other: please get compression of the proper length Methods: Explain/Verbal, Printed Responses: State content correctly Electronic Signature(s) Signed: 06/25/2017 3:59:18 PM By: Montey Hora Entered By: Montey Hora on 06/25/2017 10:15:47 [name], [name] ([id number]) -------------------------------------------------------------------------------- Wound Assessment Details Patient Name: Schumacher, Esai C. Date of Service: 06/25/2017 9:00 AM Medical Record Number: [id number] Patient Account Number: [id number] Date of Birth/Sex: [birth year] (57 y.o. Male) Treating RN: Montey Hora Primary Care Dutch Ing: Nelson Chimes Other Clinician: Referring Caprice Wasko: Nelson Chimes Treating Zenya Hickam/Extender: Cathie Olden in Treatment: 0 Wound Status Wound Number: 1 Primary Etiology: Lymphedema Wound Location: Left Lower Leg - Medial, Proximal Wound Status: Open Wounding Event: Gradually Appeared Comorbid History: Sleep Apnea, Confinement Anxiety Date Acquired: 01/17/2017 Weeks Of Treatment: 0 Clustered Wound: No Photos Photo Uploaded By: Montey Hora on 06/25/2017 14:50:20 Wound Measurements Length: (cm) 1 Width: (cm) 1.1 Depth: (cm) 0.2 Area: (cm) 0.864 Volume: (cm) 0.173 % Reduction in Area: -25% % Reduction in Volume: -150.7% Epithelialization: None Tunneling: No Undermining: No Wound Description Full Thickness Without Exposed Support Foul O Classification: Structures Slough Wound Margin: Flat and Intact Exudate Medium Amount: Exudate Type: Serosanguineous Exudate Color: red, brown dor After Cleansing: No /Fibrino Yes Wound Bed Granulation Amount: Small (1-33%) Exposed Structure Granulation Quality: Red Fascia Exposed: No Necrotic Amount: Large (67-100%) Fat Layer (Subcutaneous Tissue) Exposed: Yes Necrotic Quality: Adherent Slough Tendon Exposed:  No Muscle Exposed: No Joint Exposed: No Bone Exposed: No [name], [name] ([id number]) Periwound Skin Texture Texture Color No Abnormalities Noted: No No Abnormalities Noted: No Callus: No Atrophie Blanche: No Crepitus: No Cyanosis: No Excoriation: Yes Ecchymosis: No Induration: No Erythema: Yes Rash: No Erythema Location: Circumferential Scarring: Yes Hemosiderin Staining: No Mottled: No Moisture Pallor: No No Abnormalities Noted: No Rubor: No Dry / Scaly: No Maceration: No Wound Preparation Ulcer Cleansing: Rinsed/Irrigated with Saline Topical Anesthetic Applied: Other: lidocaine 4%, Treatment Notes Wound #1 (Left, Proximal, Medial Lower Leg) 1. Cleansed with: Clean wound with  Normal Saline 2. Anesthetic Topical Lidocaine 4% cream to wound bed prior to debridement 4. Dressing Applied: Santyl Ointment 5. Secondary Dressing Applied Bordered Foam Dressing Dry Gauze Electronic Signature(s) Signed: 06/25/2017 3:59:18 PM By: Montey Hora Entered By: Montey Hora on 06/25/2017 09:05:48 [name], [name] ([id number]) -------------------------------------------------------------------------------- Wound Assessment Details Patient Name: Hassan, Elvert C. Date of Service: 06/25/2017 9:00 AM Medical Record Number: [id number] Patient Account Number: [id number] Date of Birth/Sex: Oct 18, 1960 (57 y.o. Male) Treating RN: Montey Hora Primary Care Daymen Hassebrock: Nelson Chimes Other Clinician: Referring Rafiel Mecca: Nelson Chimes Treating Abubakr Wieman/Extender: Cathie Olden in Treatment: 0 Wound Status Wound Number: 2 Primary Etiology: Lymphedema Wound Location: Left Lower Leg - Midline Wound Status: Open Wounding Event: Gradually Appeared Comorbid History: Sleep Apnea, Confinement Anxiety Date Acquired: 01/17/2017 Weeks Of Treatment: 0 Clustered Wound: No Photos Photo Uploaded By: Montey Hora on 06/25/2017 14:50:21 Wound Measurements Length: (cm) 2.6 Width: (cm)  2.7 Depth: (cm) 0.1 Area: (cm) 5.513 Volume: (cm) 0.551 % Reduction in Area: -8% % Reduction in Volume: -7.8% Epithelialization: None Tunneling: No Undermining: No Wound Description Full Thickness Without Exposed Support Classification: Structures Wound Margin: Flat and Intact Exudate Large Amount: Exudate Type: Serosanguineous Exudate Color: red, brown Foul Odor After Cleansing: No Wound Bed Granulation Amount: Small (1-33%) Exposed Structure Granulation Quality: Red Fascia Exposed: No Necrotic Amount: Large (67-100%) Fat Layer (Subcutaneous Tissue) Exposed: No Necrotic Quality: Adherent Slough Tendon Exposed: No Muscle Exposed: No Joint Exposed: No Bone Exposed: No [name], [name] ([id number]) Periwound Skin Texture Texture Color No Abnormalities Noted: No No Abnormalities Noted: No Callus: No Atrophie Blanche: No Crepitus: No Cyanosis: No Excoriation: Yes Ecchymosis: No Induration: No Erythema: Yes Rash: No Erythema Location: Circumferential Scarring: No Hemosiderin Staining: No Mottled: No Moisture Pallor: No No Abnormalities Noted: No Rubor: No Dry / Scaly: No Maceration: No Wound Preparation Ulcer Cleansing: Rinsed/Irrigated with Saline Topical Anesthetic Applied: Other: lisdocaine 4%, Treatment Notes Wound #2 (Left, Midline Lower Leg) 1. Cleansed with: Clean wound with Normal Saline 2. Anesthetic Topical Lidocaine 4% cream to wound bed prior to debridement 4. Dressing Applied: Santyl Ointment 5. Secondary Dressing Applied Bordered Foam Dressing Dry Gauze Electronic Signature(s) Signed: 06/25/2017 3:59:18 PM By: Montey Hora Entered By: Montey Hora on 06/25/2017 09:05:57 [name], [name] ([id number]) -------------------------------------------------------------------------------- Wound Assessment Details Patient Name: Poullard, Johndaniel C. Date of Service: 06/25/2017 9:00 AM Medical Record Number: [id number] Patient Account Number:  [id number] Date of Birth/Sex: 17-Jun-1960 (57 y.o. Male) Treating RN: Montey Hora Primary Care Zoella Roberti: Nelson Chimes Other Clinician: Referring Carmelia Tiner: Nelson Chimes Treating Imelda Dandridge/Extender: Cathie Olden in Treatment: 0 Wound Status Wound Number: 3 Primary Etiology: Lymphedema Wound Location: Left Lower Leg - Midline, Distal Wound Status: Open Wounding Event: Gradually Appeared Comorbid History: Sleep Apnea, Confinement Anxiety Date Acquired: 01/17/2017 Weeks Of Treatment: 0 Clustered Wound: No Photos Photo Uploaded By: Montey Hora on 06/25/2017 14:50:42 Wound Measurements Length: (cm) 0.5 Width: (cm) 0.5 Depth: (cm) 0.1 Area: (cm) 0.196 Volume: (cm) 0.02 % Reduction in Area: -55.6% % Reduction in Volume: -53.8% Epithelialization: None Tunneling: No Undermining: No Wound Description Classification: Partial Thickness Wound Margin: Flat and Intact Exudate Amount: Small Exudate Type: Serosanguineous Exudate Color: red, brown Foul Odor After Cleansing: No Slough/Fibrino Yes Wound Bed Granulation Amount: Medium (34-66%) Exposed Structure Granulation Quality: Red Fascia Exposed: No Necrotic Amount: Medium (34-66%) Fat Layer (Subcutaneous Tissue) Exposed: No Necrotic Quality: Adherent Slough Tendon Exposed: No Muscle Exposed: No Joint Exposed: No Bone Exposed: No Periwound Skin Texture [name], [name] ([id number]) Texture Color No Abnormalities  Noted: No No Abnormalities Noted: No Callus: No Atrophie Blanche: No Crepitus: No Cyanosis: No Excoriation: Yes Ecchymosis: No Induration: No Erythema: Yes Rash: No Erythema Location: Circumferential Scarring: No Hemosiderin Staining: No Mottled: No Moisture Pallor: No No Abnormalities Noted: No Rubor: No Dry / Scaly: No Maceration: No Wound Preparation Ulcer Cleansing: Rinsed/Irrigated with Saline Topical Anesthetic Applied: Other: lidocaine 4%, Treatment Notes Wound #3 (Left,  Distal, Midline Lower Leg) 1. Cleansed with: Clean wound with Normal Saline 2. Anesthetic Topical Lidocaine 4% cream to wound bed prior to debridement 4. Dressing Applied: Santyl Ointment 5. Secondary Dressing Applied Bordered Foam Dressing Dry Gauze Electronic Signature(s) Signed: 06/25/2017 3:59:18 PM By: Montey Hora Entered By: Montey Hora on 06/25/2017 09:06:05 Volker, Shaw C. ([id number]) -------------------------------------------------------------------------------- [place] Details Patient Name: Huhta, Gifford C. Date of Service: 06/25/2017 9:00 AM Medical Record Number: [id number] Patient Account Number: [id number] Date of Birth/Sex: 1961-01-21 (57 y.o. Male) Treating RN: Montey Hora Primary Care Mackinze Criado: Nelson Chimes Other Clinician: Referring Rodell Marrs: Nelson Chimes Treating Tauren Delbuono/Extender: Cathie Olden in Treatment: 0 Vital Signs Time Taken: 08:59 Temperature (F): 98.1 Height (in): 75 Pulse (bpm): 102 Weight (lbs): 179 Respiratory Rate (breaths/min): 18 Body Mass Index (BMI): 22.4 Blood Pressure (mmHg): 139/85 Reference Range: 80 - 120 mg / dl Electronic Signature(s) Signed: 06/25/2017 3:59:18 PM By: Montey Hora Entered By: Montey Hora on 06/25/2017 09:00:20

## 2017-06-30 ENCOUNTER — Encounter: Payer: 59 | Admitting: Nurse Practitioner

## 2017-06-30 DIAGNOSIS — I890 Lymphedema, not elsewhere classified: Secondary | ICD-10-CM | POA: Diagnosis not present

## 2017-07-01 NOTE — Progress Notes (Signed)
BRONSON, BRESSMAN ([id number]) Visit Report for 06/30/2017 Arrival Information Details Patient Name: [name], [name] Date of Service: 06/30/2017 9:30 AM Medical Record Number: [id number] Patient Account Number: [id number] Date of Birth/Sex: 1961/04/27 (57 y.o. Male) Treating RN: Montey Hora Primary Care Kordelia Severin: Nelson Chimes Other Clinician: Referring Danetra Glock: Nelson Chimes Treating Artice Bergerson/Extender: Cathie Olden in Treatment: 1 Visit Information History Since Last Visit Added or deleted any medications: No Patient Arrived: Ambulatory Any new allergies or adverse reactions: No Arrival Time: 09:35 Had a fall or experienced change in No Accompanied By: self activities of daily living that may affect Transfer Assistance: None risk of falls: Patient Identification Verified: Yes Signs or symptoms of abuse/neglect since last visito No Secondary Verification Process Completed: Yes Hospitalized since last visit: No Has Dressing in Place as Prescribed: Yes Pain Present Now: No Electronic Signature(s) Signed: 06/30/2017 4:56:35 PM By: Montey Hora Entered By: Montey Hora on 06/30/2017 09:36:06 [name], [name] ([id number]) -------------------------------------------------------------------------------- Encounter Discharge Information Details Patient Name: [name], [name] Date of Service: 06/30/2017 9:30 AM Medical Record Number: [id number] Patient Account Number: [id number] Date of Birth/Sex: 1960/10/20 (57 y.o. Male) Treating RN: Montey Hora Primary Care Donnell Beauchamp: Nelson Chimes Other Clinician: Referring Marye Eagen: Nelson Chimes Treating Arbell Wycoff/Extender: Cathie Olden in Treatment: 1 Encounter Discharge Information Items Discharge Pain Level: 0 Discharge Condition: Stable Ambulatory Status: Ambulatory Discharge Destination: Home Transportation: Private Auto Accompanied By: self Schedule Follow-up Appointment: Yes Medication Reconciliation completed  and No provided to Patient/Care [place] Basic: Provided on Clinical Summary of Care: 06/30/2017 Form Type Recipient Paper Patient lf Electronic Signature(s) Signed: 06/30/2017 11:20:44 AM By: Montey Hora Entered By: Montey Hora on 06/30/2017 11:20:44 [name], [name] ([id number]) -------------------------------------------------------------------------------- Lower Extremity Assessment Details Patient Name: [name], [name] Date of Service: 06/30/2017 9:30 AM Medical Record Number: [id number] Patient Account Number: [id number] Date of Birth/Sex: 05-05-1961 (57 y.o. Male) Treating RN: Montey Hora Primary Care Trinidi Toppins: Nelson Chimes Other Clinician: Referring Janeese Mcgloin: Nelson Chimes Treating Astin Rape/Extender: Cathie Olden in Treatment: 1 Edema Assessment Assessed: [Left: No] [Right: No] [Left: Edema] [Right: :] Calf Left: Right: Point of Measurement: 37 cm From Medial Instep 44.6 cm cm Ankle Left: Right: Point of Measurement: 12 cm From Medial Instep 30.8 cm cm Vascular Assessment Pulses: Dorsalis Pedis Palpable: [Left:Yes] Posterior Tibial Extremity colors, hair growth, and conditions: Extremity Color: [Left:Hyperpigmented] Hair Growth on Extremity: [Left:Yes] Temperature of Extremity: [Left:Warm] Capillary Refill: [Left:< 3 seconds] Electronic Signature(s) Signed: 06/30/2017 4:56:35 PM By: Montey Hora Entered By: Montey Hora on 06/30/2017 09:48:46 [name], [name] ([id number]) -------------------------------------------------------------------------------- Multi Wound Chart Details Patient Name: [name], [name] Date of Service: 06/30/2017 9:30 AM Medical Record Number: [id number] Patient Account Number: [id number] Date of Birth/Sex: 1961/02/29 (57 y.o. Male) Treating RN: Montey Hora Primary Care Omah Dewalt: Nelson Chimes Other Clinician: Referring Laetitia Schnepf: Nelson Chimes Treating Doni Bacha/Extender: Cathie Olden in Treatment: 1 Vital  Signs Height(in): 75 Pulse(bpm): 106 Weight(lbs): 179 Blood Pressure(mmHg): 138/72 Body Mass Index(BMI): 22 Temperature(F): 98.6 Respiratory Rate 18 (breaths/min): Photos: Wound Location: Left Lower Leg - Medial, Left Lower Leg - Midline Left Lower Leg - Midline, Distal Proximal Wounding Event: Gradually Appeared Gradually Appeared Gradually Appeared Primary Etiology: Lymphedema Lymphedema Lymphedema Comorbid History: Sleep Apnea, Confinement Sleep Apnea, Confinement Sleep Apnea, Confinement Anxiety Anxiety Anxiety Date Acquired: 01/17/2017 01/17/2017 01/17/2017 Weeks of Treatment: 1 1 1  Wound Status: Open Open Open Measurements L x W x D 1x1.2x0.2 2.7x2.7x0.1 0.5x0.5x0.1 (cm) Area (cm) : 0.942 5.726 0.196 Volume (cm) : 0.188 0.573 0.02 % Reduction in Area: -36.30% -12.20% -55.60% %  Reduction in Volume: -172.50% -12.10% -53.80% Classification: Full Thickness Without Full Thickness Without Partial Thickness Exposed Support Structures Exposed Support Structures Exudate Amount: Medium Large Small Exudate Type: Serosanguineous Serosanguineous Serosanguineous Exudate Color: red, brown red, brown red, brown Wound Margin: Flat and Intact Flat and Intact Flat and Intact Granulation Amount: Small (1-33%) Small (1-33%) Medium (34-66%) Granulation Quality: Red Red Red Necrotic Amount: Large (67-100%) Large (67-100%) Medium (34-66%) Exposed Structures: Fat Layer (Subcutaneous Fascia: No Fascia: No Tissue) Exposed: Yes Fat Layer (Subcutaneous Fat Layer (Subcutaneous Fascia: No Tissue) Exposed: No Tissue) Exposed: No Tendon: No Tendon: No Tendon: No Muscle: No Muscle: No Muscle: No [name], [name] ([id number]) Joint: No Joint: No Joint: No Bone: No Bone: No Bone: No Epithelialization: None None None Debridement: Debridement ([id number]) Debridement ([id number]) Debridement ([id number]) Pre-procedure 09:54 09:56 09:58 Verification/Time Out Taken: Pain Control: Lidocaine  4% Topical Solution Lidocaine 4% Topical Solution Lidocaine 4% Topical Solution Tissue Debrided: Fibrin/Slough, Subcutaneous Fibrin/Slough, Subcutaneous Fibrin/Slough, Subcutaneous Level: Skin/Subcutaneous Tissue Skin/Subcutaneous Tissue Skin/Subcutaneous Tissue Debridement Area (sq cm): 1.2 7.29 0.25 Instrument: Curette Curette Curette Bleeding: Minimum Minimum Minimum Hemostasis Achieved: Pressure Pressure Pressure Procedural Pain: 0 0 0 Post Procedural Pain: 0 0 0 Debridement Treatment Procedure was tolerated well Procedure was tolerated well Procedure was tolerated well Response: Post Debridement 1x1.2x0.3 2.7x2.7x0.2 0.5x0.5x0.2 Measurements L x W x D (cm) Post Debridement Volume: 0.283 1.145 0.039 (cm) Periwound Skin Texture: Excoriation: Yes Excoriation: Yes Excoriation: Yes Scarring: Yes Induration: No Induration: No Induration: No Callus: No Callus: No Callus: No Crepitus: No Crepitus: No Crepitus: No [name]: No [name]: No [name]: No Scarring: No Scarring: No Periwound Skin Moisture: Maceration: No Maceration: No Maceration: No Dry/Scaly: No Dry/Scaly: No Dry/Scaly: No Periwound Skin Color: Erythema: Yes Erythema: Yes Erythema: Yes Atrophie Blanche: No Atrophie Blanche: No Atrophie Blanche: No Cyanosis: No Cyanosis: No Cyanosis: No Ecchymosis: No Ecchymosis: No Ecchymosis: No Hemosiderin Staining: No Hemosiderin Staining: No Hemosiderin Staining: No Mottled: No Mottled: No Mottled: No Pallor: No Pallor: No Pallor: No Rubor: No Rubor: No Rubor: No Erythema Location: Circumferential Circumferential Circumferential Temperature: No Abnormality No Abnormality No Abnormality Tenderness on Palpation: Yes Yes Yes Wound Preparation: Ulcer Cleansing: Ulcer Cleansing: Ulcer Cleansing: Rinsed/Irrigated with Saline Rinsed/Irrigated with Saline Rinsed/Irrigated with Saline Topical Anesthetic Applied: Topical Anesthetic Applied: Topical Anesthetic  Applied: Other: lidocaine 4% Other: lisdocaine 4% Other: lidocaine 4% Procedures Performed: Debridement Debridement Debridement Wound Number: 4 N/A N/A Photos: N/A N/A [name], [name] ([id number]) Wound Location: Left Foot - Medial N/A N/A Wounding Event: Gradually Appeared N/A N/A Primary Etiology: Lymphedema N/A N/A Comorbid History: Sleep Apnea, Confinement N/A N/A Anxiety Date Acquired: 06/27/2017 N/A N/A Weeks of Treatment: 0 N/A N/A Wound Status: Open N/A N/A Measurements L x W x D 0.5x0.7x0.1 N/A N/A (cm) Area (cm) : 0.275 N/A N/A Volume (cm) : 0.027 N/A N/A % Reduction in Area: N/A N/A N/A % Reduction in Volume: N/A N/A N/A Classification: Partial Thickness N/A N/A Exudate Amount: Large N/A N/A Exudate Type: Serous N/A N/A Exudate Color: amber N/A N/A Wound Margin: Flat and Intact N/A N/A Granulation Amount: Large (67-100%) N/A N/A Granulation Quality: Red N/A N/A Necrotic Amount: Small (1-33%) N/A N/A Exposed Structures: Fascia: No N/A N/A Fat Layer (Subcutaneous Tissue) Exposed: No Tendon: No Muscle: No Joint: No Bone: No Epithelialization: None N/A N/A Debridement: N/A N/A N/A Pain Control: N/A N/A N/A Tissue Debrided: N/A N/A N/A Level: N/A N/A N/A Debridement Area (sq cm): N/A N/A N/A Instrument: N/A N/A N/A Bleeding: N/A N/A N/A  Hemostasis Achieved: N/A N/A N/A Procedural Pain: N/A N/A N/A Post Procedural Pain: N/A N/A N/A Debridement Treatment N/A N/A N/A Response: Post Debridement N/A N/A N/A Measurements L x W x D (cm) Post Debridement Volume: N/A N/A N/A (cm) Periwound Skin Texture: Excoriation: No N/A N/A Induration: No Callus: No Crepitus: No [name]: No Scarring: No Periwound Skin Moisture: Maceration: No N/A N/A Dry/Scaly: No Periwound Skin Color: Erythema: Yes N/A N/A Atrophie Blanche: No [name], [name] ([id number]) Cyanosis: No Ecchymosis: No Hemosiderin Staining: No Mottled: No Pallor: No Rubor: No Erythema Location:  Circumferential N/A N/A Temperature: No Abnormality N/A N/A Tenderness on Palpation: Yes N/A N/A Wound Preparation: Ulcer Cleansing: N/A N/A Rinsed/Irrigated with Saline Topical Anesthetic Applied: Other: lidocaine 4% Procedures Performed: N/A N/A N/A Treatment Notes Electronic Signature(s) Signed: 06/30/2017 4:14:39 PM By: Lawanda Cousins Entered By: Lawanda Cousins on 06/30/2017 10:19:05 Renninger, Ayiden Loletha [name] ([id number]) -------------------------------------------------------------------------------- [place] Details Patient Name: Amor, Geneva C. Date of Service: 06/30/2017 9:30 AM Medical Record Number: [id number] Patient Account Number: [id number] Date of Birth/Sex: December 02, 1960 (57 y.o. Male) Treating RN: Montey Hora Primary Care Ivar Domangue: Nelson Chimes Other Clinician: Referring Carthel Castille: Nelson Chimes Treating Zilah Villaflor/Extender: Cathie Olden in Treatment: 1 Active Inactive ` Orientation to the Wound Care Program Nursing Diagnoses: Knowledge deficit related to the wound healing center program Goals: Patient/caregiver will verbalize understanding of the [place] Program Date Initiated: 06/19/2017 Target Resolution Date: 07/10/2017 Goal Status: Active Interventions: Provide education on orientation to the wound center Notes: ` Wound/Skin Impairment Nursing Diagnoses: Impaired tissue integrity Goals: Patient/caregiver will verbalize understanding of skin care regimen Date Initiated: 06/19/2017 Target Resolution Date: 07/10/2017 Goal Status: Active Ulcer/skin breakdown will have a volume reduction of 30% by week 4 Date Initiated: 06/19/2017 Target Resolution Date: 07/10/2017 Goal Status: Active Interventions: Assess patient/caregiver ability to obtain necessary supplies Assess patient/caregiver ability to perform ulcer/skin care regimen upon admission and as needed Assess ulceration(s) every visit Treatment Activities: Skin care regimen  initiated : 06/19/2017 Notes: Electronic Signature(s) Signed: 06/30/2017 4:56:35 PM By: Georgena Spurling, Mechele Claude ([id number]) Entered By: Montey Hora on 06/30/2017 09:55:26 Burlison, Olga C. ([id number]) -------------------------------------------------------------------------------- Pain Assessment Details Patient Name: [name], [name] Date of Service: 06/30/2017 9:30 AM Medical Record Number: [id number] Patient Account Number: [id number] Date of Birth/Sex: Oct 11, 1960 (57 y.o. Male) Treating RN: Montey Hora Primary Care Hussain Maimone: Nelson Chimes Other Clinician: Referring Meoshia Billing: Nelson Chimes Treating Davin Muramoto/Extender: Cathie Olden in Treatment: 1 Active Problems Location of Pain Severity and Description of Pain Patient Has Paino No Site Locations Pain Management and Medication Current Pain Management: Notes Topical or injectable lidocaine is offered to patient for acute pain when surgical debridement is performed. If needed, Patient is instructed to use over the counter pain medication for the following 24-48 hours after debridement. Wound care MDs do not prescribed pain medications. Patient has chronic pain or uncontrolled pain. Patient has been instructed to make an appointment with their Primary Care Physician for pain management. Electronic Signature(s) Signed: 06/30/2017 4:56:35 PM By: Montey Hora Entered By: Montey Hora on 06/30/2017 09:36:16 [name], [name] ([id number]) -------------------------------------------------------------------------------- Patient/Caregiver Education Details Patient Name: Trudell, Kemo C. Date of Service: 06/30/2017 9:30 AM Medical Record Number: [id number] Patient Account Number: [id number] Date of Birth/Gender: Oct 22, 1960 (57 y.o. Male) Treating RN: Montey Hora Primary Care Physician: Nelson Chimes Other Clinician: Referring Physician: Nelson Chimes Treating Physician/Extender: Cathie Olden in Treatment:  1 Education Assessment Education Provided To: Patient Education Topics Provided Wound/Skin Impairment: Handouts: Other: wound care and skin  protection Methods: Demonstration, Explain/Verbal Responses: State content correctly Electronic Signature(s) Signed: 06/30/2017 4:56:35 PM By: Montey Hora Entered By: Montey Hora on 06/30/2017 11:21:16 [name], [name] ([id number]) -------------------------------------------------------------------------------- Wound Assessment Details Patient Name: [name], [name] Date of Service: 06/30/2017 9:30 AM Medical Record Number: [id number] Patient Account Number: [id number] Date of Birth/Sex: 09/12/1960 (57 y.o. Male) Treating RN: Montey Hora Primary Care Verl Kitson: Nelson Chimes Other Clinician: Referring Iyania Denne: Nelson Chimes Treating Xaiden Fleig/Extender: Cathie Olden in Treatment: 1 Wound Status Wound Number: 1 Primary Etiology: Lymphedema Wound Location: Left Lower Leg - Medial, Proximal Wound Status: Open Wounding Event: Gradually Appeared Comorbid History: Sleep Apnea, Confinement Anxiety Date Acquired: 01/17/2017 Weeks Of Treatment: 1 Clustered Wound: No Photos Photo Uploaded By: Montey Hora on 06/30/2017 09:52:04 Wound Measurements Length: (cm) 1 Width: (cm) 1.2 Depth: (cm) 0.2 Area: (cm) 0.942 Volume: (cm) 0.188 % Reduction in Area: -36.3% % Reduction in Volume: -172.5% Epithelialization: None Tunneling: No Undermining: No Wound Description Full Thickness Without Exposed Support Foul O Classification: Structures Slough Wound Margin: Flat and Intact Exudate Medium Amount: Exudate Type: Serosanguineous Exudate Color: red, brown dor After Cleansing: No /Fibrino Yes Wound Bed Granulation Amount: Small (1-33%) Exposed Structure Granulation Quality: Red Fascia Exposed: No Necrotic Amount: Large (67-100%) Fat Layer (Subcutaneous Tissue) Exposed: Yes Necrotic Quality: Adherent Slough Tendon Exposed:  No Muscle Exposed: No Joint Exposed: No Bone Exposed: No Mcwherter, Benoit C. ([id number]) Periwound Skin Texture Texture Color No Abnormalities Noted: No No Abnormalities Noted: No Callus: No Atrophie Blanche: No Crepitus: No Cyanosis: No Excoriation: Yes Ecchymosis: No Induration: No Erythema: Yes [name]: No Erythema Location: Circumferential Scarring: Yes Hemosiderin Staining: No Mottled: No Moisture Pallor: No No Abnormalities Noted: No Rubor: No Dry / Scaly: No Maceration: No Temperature / Pain Temperature: No Abnormality Tenderness on Palpation: Yes Wound Preparation Ulcer Cleansing: Rinsed/Irrigated with Saline Topical Anesthetic Applied: Other: lidocaine 4%, Treatment Notes Wound #1 (Left, Proximal, Medial Lower Leg) 1. Cleansed with: Clean wound with Normal Saline 2. Anesthetic Topical Lidocaine 4% cream to wound bed prior to debridement 4. Dressing Applied: Santyl Ointment Other dressing (specify in notes) 5. Secondary Dressing Applied Dry Gauze Non-Adherent pad 7. Secured with Paper tape Patient to wear own compression stockings Notes triple antibiotic ointment only on wound #4 Electronic Signature(s) Signed: 06/30/2017 4:56:35 PM By: Montey Hora Entered By: Montey Hora on 06/30/2017 09:46:33 [name], [name] ([id number]) -------------------------------------------------------------------------------- Wound Assessment Details Patient Name: [name], Sreekar C. Date of Service: 06/30/2017 9:30 AM Medical Record Number: [id number] Patient Account Number: [id number] Date of Birth/Sex: March 26, 1961 (57 y.o. Male) Treating RN: Montey Hora Primary Care Demitrius Crass: Nelson Chimes Other Clinician: Referring Yoshua Geisinger: Nelson Chimes Treating Pratt Bress/Extender: Cathie Olden in Treatment: 1 Wound Status Wound Number: 2 Primary Etiology: Lymphedema Wound Location: Left Lower Leg - Midline Wound Status: Open Wounding Event: Gradually Appeared Comorbid  History: Sleep Apnea, Confinement Anxiety Date Acquired: 01/17/2017 Weeks Of Treatment: 1 Clustered Wound: No Photos Photo Uploaded By: Montey Hora on 06/30/2017 09:51:38 Wound Measurements Length: (cm) 2.7 Width: (cm) 2.7 Depth: (cm) 0.1 Area: (cm) 5.726 Volume: (cm) 0.573 % Reduction in Area: -12.2% % Reduction in Volume: -12.1% Epithelialization: None Tunneling: No Undermining: No Wound Description Full Thickness Without Exposed Support Classification: Structures Wound Margin: Flat and Intact Exudate Large Amount: Exudate Type: Serosanguineous Exudate Color: red, brown Foul Odor After Cleansing: No Wound Bed Granulation Amount: Small (1-33%) Exposed Structure Granulation Quality: Red Fascia Exposed: No Necrotic Amount: Large (67-100%) Fat Layer (Subcutaneous Tissue) Exposed: No Necrotic Quality: Adherent Slough Tendon Exposed: No  Muscle Exposed: No Joint Exposed: No Bone Exposed: No Chalupa, Dominque C. ([id number]) Periwound Skin Texture Texture Color No Abnormalities Noted: No No Abnormalities Noted: No Callus: No Atrophie Blanche: No Crepitus: No Cyanosis: No Excoriation: Yes Ecchymosis: No Induration: No Erythema: Yes [name]: No Erythema Location: Circumferential Scarring: No Hemosiderin Staining: No Mottled: No Moisture Pallor: No No Abnormalities Noted: No Rubor: No Dry / Scaly: No Maceration: No Temperature / Pain Temperature: No Abnormality Tenderness on Palpation: Yes Wound Preparation Ulcer Cleansing: Rinsed/Irrigated with Saline Topical Anesthetic Applied: Other: lisdocaine 4%, Treatment Notes Wound #2 (Left, Midline Lower Leg) 1. Cleansed with: Clean wound with Normal Saline 2. Anesthetic Topical Lidocaine 4% cream to wound bed prior to debridement 4. Dressing Applied: Santyl Ointment Other dressing (specify in notes) 5. Secondary Dressing Applied Dry Gauze Non-Adherent pad 7. Secured with Paper tape Patient to wear own  compression stockings Notes triple antibiotic ointment only on wound #4 Electronic Signature(s) Signed: 06/30/2017 4:56:35 PM By: Montey Hora Entered By: Montey Hora on 06/30/2017 09:46:46 Detter, Caine C. ([id number]) -------------------------------------------------------------------------------- Wound Assessment Details Patient Name: Kuehnel, Acelin C. Date of Service: 06/30/2017 9:30 AM Medical Record Number: [id number] Patient Account Number: [id number] Date of Birth/Sex: January 27, 1961 (57 y.o. Male) Treating RN: Montey Hora Primary Care Gaby Harney: Nelson Chimes Other Clinician: Referring Keslee Harrington: Nelson Chimes Treating Sharmarke Cicio/Extender: Cathie Olden in Treatment: 1 Wound Status Wound Number: 3 Primary Etiology: Lymphedema Wound Location: Left Lower Leg - Midline, Distal Wound Status: Open Wounding Event: Gradually Appeared Comorbid History: Sleep Apnea, Confinement Anxiety Date Acquired: 01/17/2017 Weeks Of Treatment: 1 Clustered Wound: No Photos Photo Uploaded By: Montey Hora on 06/30/2017 09:51:38 Wound Measurements Length: (cm) 0.5 Width: (cm) 0.5 Depth: (cm) 0.1 Area: (cm) 0.196 Volume: (cm) 0.02 % Reduction in Area: -55.6% % Reduction in Volume: -53.8% Epithelialization: None Tunneling: No Undermining: No Wound Description Classification: Partial Thickness Wound Margin: Flat and Intact Exudate Amount: Small Exudate Type: Serosanguineous Exudate Color: red, brown Foul Odor After Cleansing: No Slough/Fibrino Yes Wound Bed Granulation Amount: Medium (34-66%) Exposed Structure Granulation Quality: Red Fascia Exposed: No Necrotic Amount: Medium (34-66%) Fat Layer (Subcutaneous Tissue) Exposed: No Necrotic Quality: Adherent Slough Tendon Exposed: No Muscle Exposed: No Joint Exposed: No Bone Exposed: No Periwound Skin Texture Oscarson, Caprice C. ([id number]) Texture Color No Abnormalities Noted: No No Abnormalities Noted: No Callus:  No Atrophie Blanche: No Crepitus: No Cyanosis: No Excoriation: Yes Ecchymosis: No Induration: No Erythema: Yes [name]: No Erythema Location: Circumferential Scarring: No Hemosiderin Staining: No Mottled: No Moisture Pallor: No No Abnormalities Noted: No Rubor: No Dry / Scaly: No Maceration: No Temperature / Pain Temperature: No Abnormality Tenderness on Palpation: Yes Wound Preparation Ulcer Cleansing: Rinsed/Irrigated with Saline Topical Anesthetic Applied: Other: lidocaine 4%, Treatment Notes Wound #3 (Left, Distal, Midline Lower Leg) 1. Cleansed with: Clean wound with Normal Saline 2. Anesthetic Topical Lidocaine 4% cream to wound bed prior to debridement 4. Dressing Applied: Santyl Ointment Other dressing (specify in notes) 5. Secondary Dressing Applied Dry Gauze Non-Adherent pad 7. Secured with Paper tape Patient to wear own compression stockings Notes triple antibiotic ointment only on wound #4 Electronic Signature(s) Signed: 06/30/2017 4:56:35 PM By: Montey Hora Entered By: Montey Hora on 06/30/2017 09:46:58 Agudelo, Rochelle C. ([id number]) -------------------------------------------------------------------------------- Wound Assessment Details Patient Name: Vaccarella, Jovonta C. Date of Service: 06/30/2017 9:30 AM Medical Record Number: [id number] Patient Account Number: [id number] Date of Birth/Sex: November 23, 1960 (57 y.o. Male) Treating RN: Montey Hora Primary Care Starlet Gallentine: Nelson Chimes Other Clinician: Referring Augie Vane: Nelson Chimes Treating Keosha Rossa/Extender: Lawanda Cousins  Weeks in Treatment: 1 Wound Status Wound Number: 4 Primary Etiology: Lymphedema Wound Location: Left Foot - Medial Wound Status: Open Wounding Event: Gradually Appeared Comorbid History: Sleep Apnea, Confinement Anxiety Date Acquired: 06/27/2017 Weeks Of Treatment: 0 Clustered Wound: No Photos Photo Uploaded By: Montey Hora on 06/30/2017 09:51:50 Wound  Measurements Length: (cm) 0.5 Width: (cm) 0.7 Depth: (cm) 0.1 Area: (cm) 0.275 Volume: (cm) 0.027 % Reduction in Area: % Reduction in Volume: Epithelialization: None Tunneling: No Undermining: No Wound Description Classification: Partial Thickness Wound Margin: Flat and Intact Exudate Amount: Large Exudate Type: Serous Exudate Color: amber Foul Odor After Cleansing: No Slough/Fibrino Yes Wound Bed Granulation Amount: Large (67-100%) Exposed Structure Granulation Quality: Red Fascia Exposed: No Necrotic Amount: Small (1-33%) Fat Layer (Subcutaneous Tissue) Exposed: No Necrotic Quality: Adherent Slough Tendon Exposed: No Muscle Exposed: No Joint Exposed: No Bone Exposed: No Periwound Skin Texture Poncedeleon, Lanny C. ([id number]) Texture Color No Abnormalities Noted: No No Abnormalities Noted: No Callus: No Atrophie Blanche: No Crepitus: No Cyanosis: No Excoriation: No Ecchymosis: No Induration: No Erythema: Yes [name]: No Erythema Location: Circumferential Scarring: No Hemosiderin Staining: No Mottled: No Moisture Pallor: No No Abnormalities Noted: No Rubor: No Dry / Scaly: No Maceration: No Temperature / Pain Temperature: No Abnormality Tenderness on Palpation: Yes Wound Preparation Ulcer Cleansing: Rinsed/Irrigated with Saline Topical Anesthetic Applied: Other: lidocaine 4%, Treatment Notes Wound #4 (Left, Medial Foot) 1. Cleansed with: Clean wound with Normal Saline 2. Anesthetic Topical Lidocaine 4% cream to wound bed prior to debridement 4. Dressing Applied: Santyl Ointment Other dressing (specify in notes) 5. Secondary Dressing Applied Dry Gauze Non-Adherent pad 7. Secured with Paper tape Patient to wear own compression stockings Notes triple antibiotic ointment only on wound #4 Electronic Signature(s) Signed: 06/30/2017 4:56:35 PM By: Montey Hora Entered By: Montey Hora on 06/30/2017 09:46:10 [place], Ell C.  ([id number]) -------------------------------------------------------------------------------- Vitals Details Patient Name: Apollo, Daysean C. Date of Service: 06/30/2017 9:30 AM Medical Record Number: [id number] Patient Account Number: [id number] Date of Birth/Sex: [birth year] (57 y.o. Male) Treating RN: Montey Hora Primary Care Damaris Abeln: Nelson Chimes Other Clinician: Referring Yacob Wilkerson: Nelson Chimes Treating Lynzee Lindquist/Extender: Cathie Olden in Treatment: 1 Vital Signs Time Taken: 09:37 Temperature (F): 98.6 Height (in): 75 Pulse (bpm): 106 Weight (lbs): 179 Respiratory Rate (breaths/min): 18 Body Mass Index (BMI): 22.4 Blood Pressure (mmHg): 138/72 Reference Range: 80 - 120 mg / dl Electronic Signature(s) Signed: 06/30/2017 4:56:35 PM By: Montey Hora Entered By: Montey Hora on 06/30/2017 09:39:40

## 2017-07-02 NOTE — Progress Notes (Signed)
[name] ([id number]) Visit Report for 06/30/2017 Chief Complaint Document Details Patient Name: [name], [name]. Date of Service: 06/30/2017 9:30 AM Medical Record Number: [id number] Patient Account Number: [id number] Date of Birth/Sex: 1960-11-11 (57 y.o. Male) Treating RN: [name] Primary Care Provider: Nelson Chimes Other Clinician: Referring Provider: Nelson Chimes Treating Provider/Extender: Cathie Olden in Treatment: 1 Information Obtained from: Patient Chief Complaint He is here in follow up for lle venous ulcers Electronic Signature(s) Signed: 06/30/2017 4:14:39 PM By: Lawanda Cousins Entered By: Lawanda Cousins on 06/30/2017 10:19:17 Wortley, [name]. ([id number]) -------------------------------------------------------------------------------- Debridement Details Patient Name: [name], [name]. Date of Service: 06/30/2017 9:30 AM Medical Record Number: [id number] Patient Account Number: [id number] Date of Birth/Sex: [birth year] (57 y.o. Male) Treating RN: Montey Hora Primary Care Provider: Nelson Chimes Other Clinician: Referring Provider: Nelson Chimes Treating Provider/Extender: Cathie Olden in Treatment: 1 Debridement Performed for Wound #1 Left,Proximal,Medial Lower Leg Assessment: Performed By: Physician Lawanda Cousins, NP Debridement: Debridement Pre-procedure Verification/Time Yes - 09:54 Out Taken: Start Time: 09:54 Pain Control: Lidocaine 4% Topical Solution Level: Skin/Subcutaneous Tissue Total Area Debrided (L x W): 1 (cm) x 1.2 (cm) = 1.2 (cm) Tissue and other material Viable, Non-Viable, Fibrin/Slough, Subcutaneous debrided: Instrument: Curette Bleeding: Minimum Hemostasis Achieved: Pressure End Time: 09:56 Procedural Pain: 0 Post Procedural Pain: 0 Response to Treatment: Procedure was tolerated well Post Debridement Measurements of Total Wound Length: (cm) 1 Width: (cm) 1.2 Depth: (cm) 0.3 Volume: (cm) 0.283 Character of  Wound/Ulcer Post Debridement: Improved Post Procedure Diagnosis Same as Pre-procedure Electronic Signature(s) Signed: 06/30/2017 4:14:39 PM By: Lawanda Cousins Signed: 06/30/2017 4:56:35 PM By: Montey Hora Entered By: Montey Hora on 06/30/2017 09:57:45 Churilla, [name]. ([id number]) -------------------------------------------------------------------------------- Debridement Details Patient Name: [name], [name]. Date of Service: 06/30/2017 9:30 AM Medical Record Number: [id number] Patient Account Number: [id number] Date of Birth/Sex: 1961/05/23 (57 y.o. Male) Treating RN: Montey Hora Primary Care Provider: Nelson Chimes Other Clinician: Referring Provider: Nelson Chimes Treating Provider/Extender: Cathie Olden in Treatment: 1 Debridement Performed for Wound #2 Left,Midline Lower Leg Assessment: Performed By: Physician Lawanda Cousins, NP Debridement: Debridement Pre-procedure Verification/Time Yes - 09:56 Out Taken: Start Time: 09:56 Pain Control: Lidocaine 4% Topical Solution Level: Skin/Subcutaneous Tissue Total Area Debrided (L x W): 2.7 (cm) x 2.7 (cm) = 7.29 (cm) Tissue and other material Viable, Non-Viable, Fibrin/Slough, Subcutaneous debrided: Instrument: Curette Bleeding: Minimum Hemostasis Achieved: Pressure End Time: 09:58 Procedural Pain: 0 Post Procedural Pain: 0 Response to Treatment: Procedure was tolerated well Post Debridement Measurements of Total Wound Length: (cm) 2.7 Width: (cm) 2.7 Depth: (cm) 0.2 Volume: (cm) 1.145 Character of Wound/Ulcer Post Debridement: Improved Post Procedure Diagnosis Same as Pre-procedure Electronic Signature(s) Signed: 06/30/2017 4:14:39 PM By: Lawanda Cousins Signed: 06/30/2017 4:56:35 PM By: Montey Hora Entered By: Montey Hora on 06/30/2017 09:58:33 [name], [name]. ([id number]) -------------------------------------------------------------------------------- Debridement Details Patient Name: [name], [name]  [name]. Date of Service: 06/30/2017 9:30 AM Medical Record Number: [id number] Patient Account Number: [id number] Date of Birth/Sex: [birth year] (57 y.o. Male) Treating RN: Montey Hora Primary Care Provider: Nelson Chimes Other Clinician: Referring Provider: Nelson Chimes Treating Provider/Extender: Cathie Olden in Treatment: 1 Debridement Performed for Wound #3 Left,Distal,Midline Lower Leg Assessment: Performed By: Physician Lawanda Cousins, NP Debridement: Debridement Pre-procedure Verification/Time Yes - 09:58 Out Taken: Start Time: 09:58 Pain Control: Lidocaine 4% Topical Solution Level: Skin/Subcutaneous Tissue Total Area Debrided (L x W): 0.5 (cm) x 0.5 (cm) = 0.25 (cm) Tissue and other material Viable, Non-Viable, Fibrin/Slough, Subcutaneous debrided: Instrument: Curette Bleeding: Minimum Hemostasis Achieved:  Pressure End Time: 09:59 Procedural Pain: 0 Post Procedural Pain: 0 Response to Treatment: Procedure was tolerated well Post Debridement Measurements of Total Wound Length: (cm) 0.5 Width: (cm) 0.5 Depth: (cm) 0.2 Volume: (cm) 0.039 Character of Wound/Ulcer Post Debridement: Improved Post Procedure Diagnosis Same as Pre-procedure Electronic Signature(s) Signed: 06/30/2017 4:14:39 PM By: Lawanda Cousins Signed: 06/30/2017 4:56:35 PM By: Montey Hora Entered By: Montey Hora on 06/30/2017 09:59:02 [name], [name]. ([id number]) -------------------------------------------------------------------------------- HPI Details Patient Name: [name], [name]. Date of Service: 06/30/2017 9:30 AM Medical Record Number: [id number] Patient Account Number: [id number] Date of Birth/Sex: 02/12/1961 (57 y.o. Male) Treating RN: [name] Primary Care Provider: Nelson Chimes Other Clinician: Referring Provider: Nelson Chimes Treating Provider/Extender: Cathie Olden in Treatment: 1 History of Present Illness HPI Description: 06/25/17-he is here in follow-up  evaluation for left lower extremity, medial malleolus, venous ulcers. He is compliant with compression stocking where although his compression stocking does not fit appropriately in length. He has been advised to contact elastic therapy, who measured and provided his current stockings, regarding a more appropriate fitted stocking. He is voicing no complaints or concerns, tolerated debridement. There is improvement in appearance, no significant change in measurements. We will continue with Santyl ointment, will apply to all 3 wounds. He will follow-up next week 06/30/17-he is here earlier than his scheduled appointment on Friday, per his request, secondary to new open areas. After inquiry, it appears that the new superficial open areas are secondary to adhesive removal. There is no evidence of infection, minimal/scant drainage, most of the new areas are epithelialized. His chronic wounds are stable in appearance, will continue with Santyl. He wants to maintain Friday appointment and will follow-up next Friday, he was encouraged to contact clinic with any ulcer changes or concerns Electronic Signature(s) Signed: 06/30/2017 4:14:39 PM By: Lawanda Cousins Entered By: Lawanda Cousins on 06/30/2017 10:20:58 [name], [name]. ([id number]) -------------------------------------------------------------------------------- Physician Orders Details Patient Name: [name], [name]. Date of Service: 06/30/2017 9:30 AM Medical Record Number: [id number] Patient Account Number: [id number] Date of Birth/Sex: 1960-09-07 (57 y.o. Male) Treating RN: Montey Hora Primary Care Provider: Nelson Chimes Other Clinician: Referring Provider: Nelson Chimes Treating Provider/Extender: Cathie Olden in Treatment: 1 Verbal / Phone Orders: No Diagnosis Coding Wound Cleansing Wound #1 Left,Proximal,Medial Lower Leg o Clean wound with Normal Saline. o May Shower, gently pat wound dry prior to applying new dressing. Wound  #2 Left,Midline Lower Leg o Clean wound with Normal Saline. o May Shower, gently pat wound dry prior to applying new dressing. Wound #3 Left,Distal,Midline Lower Leg o Clean wound with Normal Saline. o May Shower, gently pat wound dry prior to applying new dressing. Anesthetic (add to Medication List) Wound #1 Left,Proximal,Medial Lower Leg o Topical Lidocaine 4% cream applied to wound bed prior to debridement (In Clinic Only). Wound #2 Left,Midline Lower Leg o Topical Lidocaine 4% cream applied to wound bed prior to debridement (In Clinic Only). Wound #3 Left,Distal,Midline Lower Leg o Topical Lidocaine 4% cream applied to wound bed prior to debridement (In Clinic Only). Primary Wound Dressing Wound #1 Left,Proximal,Medial Lower Leg o Santyl Ointment Wound #2 Left,Midline Lower Leg o Santyl Ointment Wound #3 Left,Distal,Midline Lower Leg o Santyl Ointment Wound #4 Left,Medial Foot o Other: - triple antibiotic ointment or zinc oxide skin barrier Secondary Dressing Wound #1 Left,Proximal,Medial Lower Leg o Boardered Foam Dressing Wound #2 Left,Midline Lower Leg o Boardered Foam Dressing Wound #3 Left,Distal,Midline Lower Leg Dinse, Naftali [name]. ([id number]) o Boardered Foam Dressing Dressing Change Frequency Wound #  1 Left,Proximal,Medial Lower Leg o Change dressing every day. Wound #2 Left,Midline Lower Leg o Change dressing every day. Wound #3 Left,Distal,Midline Lower Leg o Change dressing every day. Follow-up Appointments Wound #1 Left,Proximal,Medial Lower Leg o Return Appointment in 1 week. Wound #2 Left,Midline Lower Leg o Return Appointment in 1 week. Wound #3 Left,Distal,Midline Lower Leg o Return Appointment in 1 week. Edema Control Wound #1 Left,Proximal,Medial Lower Leg o Patient to wear own compression stockings Wound #2 Left,Midline Lower Leg o Patient to wear own compression stockings Wound #3 Left,Distal,Midline Lower  Leg o Patient to wear own compression stockings Electronic Signature(s) Signed: 06/30/2017 4:14:39 PM By: Lawanda Cousins Entered By: Lawanda Cousins on 06/30/2017 10:21:28 Noyce, Markee [name]. ([id number]) -------------------------------------------------------------------------------- Problem List Details Patient Name: [name], [name]. Date of Service: 06/30/2017 9:30 AM Medical Record Number: [id number] Patient Account Number: [id number] Date of Birth/Sex: [birth year] (57 y.o. 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 ([id number]) -------------------------------------------------------------------------------- Progress Note Details Patient Name: [name], [name]. Date of Service: 06/30/2017 9:30 AM Medical Record Number: [id number] Patient Account Number: [id number] Date of Birth/Sex: 1961/05/06 (57 y.o. Male) Treating RN: [name] Primary Care Provider: Nelson Chimes Other Clinician: Referring Provider: Nelson Chimes Treating Provider/Extender: Cathie Olden in Treatment: 1 Subjective Chief Complaint Information obtained from Patient He is here in follow up for lle venous ulcers History of Present Illness (HPI) 06/25/17-he is here in follow-up evaluation for left lower extremity, medial malleolus, venous ulcers. He is compliant with compression stocking where although his  compression stocking does not fit appropriately in length. He has been advised to contact elastic therapy, who measured and provided his current stockings, regarding a more appropriate fitted stocking. He is voicing no complaints or concerns, tolerated debridement. There is improvement in appearance, no significant change in measurements. We will continue with Santyl ointment, will apply to all 3 wounds. He will follow-up next week 06/30/17-he is here earlier than his scheduled appointment on Friday, per his request, secondary to new open areas. After inquiry, it appears that the new superficial open areas are secondary to adhesive removal. There is no evidence of infection, minimal/scant drainage, most of the new areas are epithelialized. His chronic wounds are stable in appearance, will continue with Santyl. He wants to maintain Friday appointment and will follow-up next Friday, he was encouraged to contact clinic with any ulcer changes or concerns Patient History Information obtained from Patient. Family History Cancer - Father, Heart Disease - Father,Siblings, No family history of Diabetes, Hereditary Spherocytosis, Hypertension, Kidney Disease, Lung Disease, Seizures, Stroke, Thyroid Problems, Tuberculosis. Social History Current some day smoker, Marital Status - Separated, Alcohol Use - Rarely, Drug Use - No History, Caffeine Use - Daily. Objective Constitutional Vitals Time Taken: 9:37 AM, Height: 75 in, Weight: 179 lbs, BMI: 22.4, Temperature: 98.6 F, Pulse: 106 bpm, Respiratory Rate: 18 breaths/min, Blood Pressure: 138/72 mmHg. Integumentary (Hair, Skin) Lant, Marshaun [name]. ([id number]) Wound #1 status is Open. Original cause of wound was Gradually Appeared. The wound is located on the Left,Proximal,Medial Lower Leg.  The wound measures 1cm length x 1.2cm width x 0.2cm depth; 0.942cm^2 area and 0.188cm^3 volume. There is Fat Layer (Subcutaneous Tissue) Exposed exposed. There is no tunneling  or undermining noted. There is a medium amount of serosanguineous drainage noted. The wound margin is flat and intact. There is small (1-33%) red granulation within the wound bed. There is a large (67-100%) amount of necrotic tissue within the wound bed including Adherent Slough. The periwound skin appearance exhibited: Excoriation, Scarring, Erythema. The periwound skin appearance did not exhibit: Callus, Crepitus, Induration, Rash, Dry/Scaly, Maceration, Atrophie Blanche, Cyanosis, Ecchymosis, Hemosiderin Staining, Mottled, Pallor, Rubor. The surrounding wound skin color is noted with erythema which is circumferential. Periwound temperature was noted as No Abnormality. The periwound has tenderness on palpation. Wound #2 status is Open. Original cause of wound was Gradually Appeared. The wound is located on the Left,Midline Lower Leg. The wound measures 2.7cm length x 2.7cm width x 0.1cm depth; 5.726cm^2 area and 0.573cm^3 volume. There is no tunneling or undermining noted. There is a large amount of serosanguineous drainage noted. The wound margin is flat and intact. There is small (1-33%) red granulation within the wound bed. There is a large (67-100%) amount of necrotic tissue within the wound bed including Adherent Slough. The periwound skin appearance exhibited: Excoriation, Erythema. The periwound skin appearance did not exhibit: Callus, Crepitus, Induration, Rash, Scarring, Dry/Scaly, Maceration, Atrophie Blanche, Cyanosis, Ecchymosis, Hemosiderin Staining, Mottled, Pallor, Rubor. The surrounding wound skin color is noted with erythema which is circumferential. Periwound temperature was noted as No Abnormality. The periwound has tenderness on palpation. Wound #3 status is Open. Original cause of wound was Gradually Appeared. The wound is located on the Left,Distal,Midline Lower Leg. The wound measures 0.5cm length x 0.5cm width x 0.1cm depth; 0.196cm^2 area and 0.02cm^3 volume. There is no  tunneling or undermining noted. There is a small amount of serosanguineous drainage noted. The wound margin is flat and intact. There is medium (34-66%) red granulation within the wound bed. There is a medium (34-66%) amount of necrotic tissue within the wound bed including Adherent Slough. The periwound skin appearance exhibited: Excoriation, Erythema. The periwound skin appearance did not exhibit: Callus, Crepitus, Induration, Rash, Scarring, Dry/Scaly, Maceration, Atrophie Blanche, Cyanosis, Ecchymosis, Hemosiderin Staining, Mottled, Pallor, Rubor. The surrounding wound skin color is noted with erythema which is circumferential. Periwound temperature was noted as No Abnormality. The periwound has tenderness on palpation. Wound #4 status is Open. Original cause of wound was Gradually Appeared. The wound is located on the Left,Medial Foot. The wound measures 0.5cm length x 0.7cm width x 0.1cm depth; 0.275cm^2 area and 0.027cm^3 volume. There is no tunneling or undermining noted. There is a large amount of serous drainage noted. The wound margin is flat and intact. There is large (67-100%) red granulation within the wound bed. There is a small (1-33%) amount of necrotic tissue within the wound bed including Adherent Slough. The periwound skin appearance exhibited: Erythema. The periwound skin appearance did not exhibit: Callus, Crepitus, Excoriation, Induration, Rash, Scarring, Dry/Scaly, Maceration, Atrophie Blanche, Cyanosis, Ecchymosis, Hemosiderin Staining, Mottled, Pallor, Rubor. The surrounding wound skin color is noted with erythema which is circumferential. Periwound temperature was noted as No Abnormality. The periwound has tenderness on palpation. -distal to original 3 ulcers are too small partial thickness areas that are almost completely epithelialized. 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 ([id number]) Procedures Wound #1 Pre-procedure diagnosis of Wound #1 is a Lymphedema located on the Left,Proximal,Medial Lower Leg . There was a Skin/Subcutaneous Tissue Debridement (81017-51025) debridement with total area of 1.2 sq cm performed by Lawanda Cousins, NP. with the following instrument(s): Curette to remove Viable and Non-Viable tissue/material including Fibrin/Slough and Subcutaneous after achieving pain control using Lidocaine 4% Topical Solution. A time out was conducted at 09:54, prior to the start of the procedure. A Minimum amount of bleeding was controlled with Pressure. The procedure was tolerated well with a pain level of 0 throughout and a pain level of 0 following the procedure. Post Debridement Measurements: 1cm length x 1.2cm width x 0.3cm depth; 0.283cm^3 volume. Character of Wound/Ulcer Post Debridement is improved. Post procedure Diagnosis Wound #1: Same as Pre-Procedure Wound #2 Pre-procedure diagnosis of Wound #2 is a Lymphedema located on the Left,Midline Lower Leg . There was a Skin/Subcutaneous Tissue Debridement ([id number]) debridement with total area of 7.29 sq cm performed by Lawanda Cousins, NP. with the following instrument(s): Curette to remove Viable and Non-Viable tissue/material including Fibrin/Slough and Subcutaneous after achieving pain control using Lidocaine 4% Topical Solution. A time out was conducted at 09:56, prior to the start of the procedure. A Minimum amount of bleeding was controlled with Pressure. The procedure was tolerated well with a pain level of 0 throughout and a pain level of 0 following the procedure. Post Debridement Measurements: 2.7cm length x 2.7cm width x 0.2cm depth; 1.145cm^3  volume. Character of Wound/Ulcer Post Debridement is improved. Post procedure Diagnosis Wound #2: Same as Pre-Procedure Wound #3 Pre-procedure diagnosis of Wound #3 is a Lymphedema located on the Left,Distal,Midline Lower Leg . There was a Skin/Subcutaneous Tissue Debridement ([id number]) debridement with total area of 0.25 sq cm performed by Lawanda Cousins, NP. with the following instrument(s): Curette to remove Viable and Non-Viable tissue/material including Fibrin/Slough and Subcutaneous after achieving pain control using Lidocaine 4% Topical Solution. A time out was conducted at 09:58, prior to the start of the procedure. A Minimum amount of bleeding was controlled with Pressure. The procedure was tolerated well with a pain level of 0 throughout and a pain level of 0 following the procedure. Post Debridement Measurements: 0.5cm length x 0.5cm width x 0.2cm depth; 0.039cm^3 volume. Character of Wound/Ulcer Post Debridement is improved. Post procedure Diagnosis Wound #3: Same as Pre-Procedure Plan Wound Cleansing: Wound #1 Left,Proximal,Medial Lower Leg: Clean wound with Normal Saline. May Shower, gently pat wound dry prior to applying new dressing. Wound #2 Left,Midline Lower Leg: Clean wound with Normal Saline. May Shower, gently pat wound dry prior to applying new dressing. Wound #3 Left,Distal,Midline Lower Leg: Clean wound with Normal Saline. May Shower, gently pat wound dry prior to applying new dressing. Anesthetic (add to Medication List): Wound #1 Left,Proximal,Medial Lower Leg: Topical Lidocaine 4% cream applied to wound bed prior to debridement (In Clinic Only). [name], [name]. ([id number]) Wound #2 Left,Midline Lower Leg: Topical Lidocaine 4% cream applied to wound bed prior to debridement (In Clinic Only). Wound #3 Left,Distal,Midline Lower Leg: Topical Lidocaine 4% cream applied to wound bed prior to debridement (In Clinic Only). Primary Wound Dressing: Wound #1  Left,Proximal,Medial Lower Leg: Santyl Ointment Wound #2 Left,Midline Lower Leg: Santyl Ointment Wound #3 Left,Distal,Midline Lower Leg: Santyl Ointment Wound #4 Left,Medial Foot: Other: - triple antibiotic ointment or zinc oxide skin barrier Secondary Dressing:  Wound #1 Left,Proximal,Medial Lower Leg: Boardered Foam Dressing Wound #2 Left,Midline Lower Leg: Boardered Foam Dressing Wound #3 Left,Distal,Midline Lower Leg: Boardered Foam Dressing Dressing Change Frequency: Wound #1 Left,Proximal,Medial Lower Leg: Change dressing every day. Wound #2 Left,Midline Lower Leg: Change dressing every day. Wound #3 Left,Distal,Midline Lower Leg: Change dressing every day. Follow-up Appointments: Wound #1 Left,Proximal,Medial Lower Leg: Return Appointment in 1 week. Wound #2 Left,Midline Lower Leg: Return Appointment in 1 week. Wound #3 Left,Distal,Midline Lower Leg: Return Appointment in 1 week. Edema Control: Wound #1 Left,Proximal,Medial Lower Leg: Patient to wear own compression stockings Wound #2 Left,Midline Lower Leg: Patient to wear own compression stockings Wound #3 Left,Distal,Midline Lower Leg: Patient to wear own compression stockings 1. Continue with Santyl to 3 chronic ulcers 2. Protect superficial open areas with zinc oxide or triple antibiotic ointment and dry cover 3. continue with compression stockings, he is going to elastic therapy today and has been re-advised to inquire about length of stockings as his fit approximately one hand breadth from popliteal fossa 4. follow up next Friday Electronic Signature(s) OLON, RUSS ([id number]) Signed: 06/30/2017 4:14:39 PM By: Lawanda Cousins Entered By: Lawanda Cousins on 06/30/2017 10:24:18 Baldwin, Lennon [name]. ([id number]) -------------------------------------------------------------------------------- ROS/PFSH Details Patient Name: Blick, Iseah [name]. Date of Service: 06/30/2017 9:30 AM Medical Record Number: [id number] Patient Account  Number: [id number] Date of Birth/Sex: March 16, 1961 (57 y.o. Male) Treating RN: [name] Primary Care Provider: Nelson Chimes Other Clinician: Referring Provider: Nelson Chimes Treating Provider/Extender: Cathie Olden in Treatment: 1 Information Obtained From Patient Wound History Do you currently have one or more open woundso Yes How many open wounds do you currently haveo 3 Approximately how long have you had your woundso several months How have you been treating your wound(s) until nowo xeroform gauze Has your wound(s) ever healed and then re-openedo No Have you had any lab work done in the past montho No Have you tested positive for an antibiotic resistant organism (MRSA, VRE)o No Have you tested positive for osteomyelitis (bone infection)o No Have you had any tests for circulation on your legso No Eyes Medical History: Negative for: Cataracts; Glaucoma; Optic Neuritis Hematologic/Lymphatic Medical History: Negative for: Anemia; Hemophilia; Human Immunodeficiency Virus; Lymphedema; Sickle Cell Disease Respiratory Medical History: Positive for: Sleep Apnea Negative for: Aspiration; Asthma; Chronic Obstructive Pulmonary Disease (COPD); Pneumothorax; Tuberculosis Cardiovascular Medical History: Negative for: Arrhythmia; Congestive Heart Failure; Coronary Artery Disease; Deep Vein Thrombosis; Hypertension; Hypotension; Myocardial Infarction; Peripheral Arterial Disease; Peripheral Venous Disease; Phlebitis; Vasculitis Gastrointestinal Medical History: Negative for: Cirrhosis ; Colitis; Crohnos; Hepatitis A; Hepatitis B; Hepatitis [name] Endocrine Medical History: Negative for: Type I Diabetes; Type II Diabetes Genitourinary Medical History: Negative for: End Stage Renal Disease Cabler, Finch [name]. ([id number]) Immunological Medical History: Negative for: Lupus Erythematosus; Raynaudos; Scleroderma Integumentary (Skin) Medical History: Negative for: History of  Burn Musculoskeletal Medical History: Negative for: Gout; Rheumatoid Arthritis; Osteoarthritis; Osteomyelitis Neurologic Medical History: Negative for: Dementia; Neuropathy; Paraplegia; Seizure Disorder Psychiatric Medical History: Positive for: Confinement Anxiety Negative for: Anorexia/bulimia Immunizations Pneumococcal Vaccine: Received Pneumococcal Vaccination: No Implantable Devices Family and Social History Cancer: Yes - Father; Diabetes: No; Heart Disease: Yes - Father,Siblings; Hereditary Spherocytosis: No; Hypertension: No; Kidney Disease: No; Lung Disease: No; Seizures: No; Stroke: No; Thyroid Problems: No; Tuberculosis: No; Current some day smoker; Marital Status - Separated; Alcohol Use: Rarely; Drug Use: No History; Caffeine Use: Daily; Financial Concerns: No; Food, Clothing or Shelter Needs: No; Support System Lacking: No; Transportation Concerns: No; Advanced Directives: No; Patient does not want information on  Advanced Directives; Do not resuscitate: No; Living Will: No; Medical Power of Attorney: No Physician Affirmation I have reviewed and agree with the above information. Electronic Signature(s) Signed: 06/30/2017 4:14:39 PM By: Lawanda Cousins Signed: 07/01/2017 5:09:29 PM By: [name], [name], RN, CWS, [name], [name] Entered By: Lawanda Cousins on 06/30/2017 10:21:07 Pence, Mechele Claude ([id number]) -------------------------------------------------------------------------------- SuperBill Details Patient Name: Johnsen, Hercules [name]. Date of Service: 06/30/2017 Medical Record Number: [id number] Patient Account Number: [id number] Date of Birth/Sex: [birth year] (57 y.o. Male) Treating RN: [name] Primary Care Provider: Nelson Chimes Other Clinician: Referring Provider: Nelson Chimes Treating Provider/Extender: Cathie Olden in Treatment: 1 Diagnosis Coding ICD-10 Codes Code Description I89.0 Lymphedema, not elsewhere classified L97.822 Non-pressure chronic ulcer of other  part of left lower leg with fat layer exposed E66.09 Other obesity due to excess calories Facility Procedures CPT4 Code Description: [id number] 11042 - DEB SUBQ TISSUE 20 SQ CM/< ICD-10 Diagnosis Description L97.822 Non-pressure chronic ulcer of other part of left lower leg with I89.0 Lymphedema, not elsewhere classified Modifier: fat layer expos Quantity: 1 ed Physician Procedures CPT4 Code Description: [id number] - WC PHYS SUBQ TISS 20 SQ CM ICD-10 Diagnosis Description L97.822 Non-pressure chronic ulcer of other part of left lower leg with I89.0 Lymphedema, not elsewhere classified Modifier: fat layer expos Quantity: 1 ed Electronic Signature(s) Signed: 06/30/2017 4:14:39 PM By: Lawanda Cousins Entered By: Lawanda Cousins on 06/30/2017 10:24:32

## 2017-07-03 ENCOUNTER — Encounter: Payer: 59 | Admitting: Physician Assistant

## 2017-07-07 ENCOUNTER — Encounter: Payer: Self-pay | Admitting: Internal Medicine

## 2017-07-07 ENCOUNTER — Ambulatory Visit (INDEPENDENT_AMBULATORY_CARE_PROVIDER_SITE_OTHER): Payer: 59 | Admitting: Internal Medicine

## 2017-07-07 ENCOUNTER — Telehealth: Payer: Self-pay

## 2017-07-07 ENCOUNTER — Other Ambulatory Visit: Payer: Self-pay | Admitting: Internal Medicine

## 2017-07-07 ENCOUNTER — Ambulatory Visit (INDEPENDENT_AMBULATORY_CARE_PROVIDER_SITE_OTHER): Payer: 59

## 2017-07-07 VITALS — BP 130/80 | HR 86 | Temp 98.5°F | Ht 75.0 in | Wt 251.6 lb

## 2017-07-07 DIAGNOSIS — M545 Low back pain, unspecified: Secondary | ICD-10-CM

## 2017-07-07 DIAGNOSIS — R918 Other nonspecific abnormal finding of lung field: Secondary | ICD-10-CM

## 2017-07-07 DIAGNOSIS — Z1329 Encounter for screening for other suspected endocrine disorder: Secondary | ICD-10-CM | POA: Diagnosis not present

## 2017-07-07 DIAGNOSIS — Z23 Encounter for immunization: Secondary | ICD-10-CM

## 2017-07-07 DIAGNOSIS — Z13220 Encounter for screening for lipoid disorders: Secondary | ICD-10-CM

## 2017-07-07 DIAGNOSIS — L97309 Non-pressure chronic ulcer of unspecified ankle with unspecified severity: Secondary | ICD-10-CM

## 2017-07-07 DIAGNOSIS — L0390 Cellulitis, unspecified: Secondary | ICD-10-CM | POA: Diagnosis not present

## 2017-07-07 DIAGNOSIS — L03116 Cellulitis of left lower limb: Secondary | ICD-10-CM | POA: Diagnosis not present

## 2017-07-07 DIAGNOSIS — J984 Other disorders of lung: Secondary | ICD-10-CM | POA: Diagnosis not present

## 2017-07-07 DIAGNOSIS — Z1159 Encounter for screening for other viral diseases: Secondary | ICD-10-CM

## 2017-07-07 DIAGNOSIS — Z113 Encounter for screening for infections with a predominantly sexual mode of transmission: Secondary | ICD-10-CM

## 2017-07-07 DIAGNOSIS — R609 Edema, unspecified: Secondary | ICD-10-CM | POA: Diagnosis not present

## 2017-07-07 DIAGNOSIS — J189 Pneumonia, unspecified organism: Secondary | ICD-10-CM

## 2017-07-07 DIAGNOSIS — Z13818 Encounter for screening for other digestive system disorders: Secondary | ICD-10-CM

## 2017-07-07 DIAGNOSIS — R600 Localized edema: Secondary | ICD-10-CM

## 2017-07-07 DIAGNOSIS — R739 Hyperglycemia, unspecified: Secondary | ICD-10-CM | POA: Diagnosis not present

## 2017-07-07 DIAGNOSIS — G8929 Other chronic pain: Secondary | ICD-10-CM

## 2017-07-07 MED ORDER — ALBUTEROL SULFATE HFA 108 (90 BASE) MCG/ACT IN AERS: 1.0000 | INHALATION_SPRAY | Freq: Four times a day (QID) | RESPIRATORY_TRACT | 11 refills | Status: AC | PRN

## 2017-07-07 MED ORDER — TIZANIDINE HCL 4 MG PO TABS: 4.0000 mg | ORAL_TABLET | Freq: Every evening | ORAL | 2 refills | Status: DC | PRN

## 2017-07-07 MED ORDER — MUPIROCIN 2 % EX OINT: 1.0000 "application " | TOPICAL_OINTMENT | Freq: Every day | CUTANEOUS | 2 refills | Status: DC

## 2017-07-07 MED ORDER — CYCLOBENZAPRINE HCL 10 MG PO TABS: 10.0000 mg | ORAL_TABLET | Freq: Every evening | ORAL | 0 refills | Status: DC | PRN

## 2017-07-07 NOTE — Patient Instructions (Addendum)
F/u in 2-4 weeks schedule fasting labs Friday  Call back if you want me to order CT chest   Wound Care, Adult Taking care of your wound properly can help to prevent pain and infection. It can also help your wound to heal more quickly. How is this treated? Wound care  Follow instructions from your health care provider about how to take care of your wound. Make sure you: ? Wash your hands with soap and water before you change the bandage (dressing). If soap and water are not available, use hand sanitizer. ? Change your dressing as told by your health care provider. ? Leave stitches (sutures), skin glue, or adhesive strips in place. These skin closures may need to stay in place for 2 weeks or longer. If adhesive strip edges start to loosen and curl up, you may trim the loose edges. Do not remove adhesive strips completely unless your health care provider tells you to do that.  Check your wound area every day for signs of infection. Check for: ? More redness, swelling, or pain. ? More fluid or blood. ? Warmth. ? Pus or a bad smell.  Ask your health care provider if you should clean the wound with mild soap and water. Doing this may include: ? Using a clean towel to pat the wound dry after cleaning it. Do not rub or scrub the wound. ? Applying a cream or ointment. Do this only as told by your health care provider. ? Covering the incision with a clean dressing.  Ask your health care provider when you can leave the wound uncovered. Medicines   If you were prescribed an antibiotic medicine, cream, or ointment, take or use the antibiotic as told by your health care provider. Do not stop taking or using the antibiotic even if your condition improves.  Take over-the-counter and prescription medicines only as told by your health care provider. If you were prescribed pain medicine, take it at least 30 minutes before doing any wound care or as told by your health care provider. General  instructions  Return to your normal activities as told by your health care provider. Ask your health care provider what activities are safe.  Do not scratch or pick at the wound.  Keep all follow-up visits as told by your health care provider. This is important.  Eat a diet that includes protein, vitamin A, vitamin C, and other nutrient-rich foods. These help the wound heal: ? Protein-rich foods include meat, dairy, beans, nuts, and other sources. ? Vitamin A-rich foods include carrots and dark green, leafy vegetables. ? Vitamin C-rich foods include citrus, tomatoes, and other fruits and vegetables. ? Nutrient-rich foods have protein, carbohydrates, fat, vitamins, or minerals. Eat a variety of healthy foods including vegetables, fruits, and whole grains. Contact a health care provider if:  You received a tetanus shot and you have swelling, severe pain, redness, or bleeding at the injection site.  Your pain is not controlled with medicine.  You have more redness, swelling, or pain around the wound.  You have more fluid or blood coming from the wound.  Your wound feels warm to the touch.  You have pus or a bad smell coming from the wound.  You have a fever or chills.  You are nauseous or you vomit.  You are dizzy. Get help right away if:  You have a red streak going away from your wound.  The edges of the wound open up and separate.  Your wound is bleeding  and the bleeding does not stop with gentle pressure.  You have a rash.  You faint.  You have trouble breathing. This information is not intended to replace advice given to you by your health care provider. Make sure you discuss any questions you have with your health care provider. Document Released: 02/26/2008 Document Revised: 01/16/2016 Document Reviewed: 12/04/2015 Elsevier Interactive Patient Education  2017 [place] Pneumonia, Adult Pneumonia is an infection of the lungs. There are  different types of pneumonia. One type can develop while a person is in a hospital. A different type, called community-acquired pneumonia, develops in people who are not, or have not recently been, in the hospital or other health care facility. What are the causes? Pneumonia may be caused by bacteria, viruses, or funguses. Community-acquired pneumonia is often caused by Streptococcus pneumonia bacteria. These bacteria are often passed from one person to another by breathing in droplets from the cough or sneeze of an infected person. What increases the risk? The condition is more likely to develop in:  People who havechronic diseases, such as chronic obstructive pulmonary disease (COPD), asthma, congestive heart failure, cystic fibrosis, diabetes, or kidney disease.  People who haveearly-stage or late-stage HIV.  People who havesickle cell disease.  People who havehad their spleen removed (splenectomy).  People who havepoor Human resources officer.  People who havemedical conditions that increase the risk of breathing in (aspirating) secretions their own mouth and nose.  People who havea weakened immune system (immunocompromised).  People who smoke.  People whotravel to areas where pneumonia-causing germs commonly exist.  People whoare around animal habitats or animals that have pneumonia-causing germs, including birds, bats, rabbits, cats, and farm animals.  What are the signs or symptoms? Symptoms of this condition include:  Adry cough.  A wet (productive) cough.  Fever.  Sweating.  Chest pain, especially when breathing deeply or coughing.  Rapid breathing or difficulty breathing.  Shortness of breath.  Shaking chills.  Fatigue.  Muscle aches.  How is this diagnosed? Your health care provider will take a medical history and perform a physical exam. You may also have other tests, including:  Imaging studies of your chest, including X-rays.  Tests to check your  blood oxygen level and other blood gases.  Other tests on blood, mucus (sputum), fluid around your lungs (pleural fluid), and urine.  If your pneumonia is severe, other tests may be done to identify the specific cause of your illness. How is this treated? The type of treatment that you receive depends on many factors, such as the cause of your pneumonia, the medicines you take, and other medical conditions that you have. For most adults, treatment and recovery from pneumonia may occur at home. In some cases, treatment must happen in a hospital. Treatment may include:  Antibiotic medicines, if the pneumonia was caused by bacteria.  Antiviral medicines, if the pneumonia was caused by a virus.  Medicines that are given by mouth or through an IV tube.  Oxygen.  Respiratory therapy.  Although rare, treating severe pneumonia may include:  Mechanical ventilation. This is done if you are not breathing well on your own and you cannot maintain a safe blood oxygen level.  Thoracentesis. This procedureremoves fluid around one lung or both lungs to help you breathe better.  Follow these instructions at home:  Take over-the-counter and prescription medicines only as told by your health care provider. ? Only takecough medicine if you are losing sleep. Understand that cough medicine can prevent your  body's natural ability to remove mucus from your lungs. ? If you were prescribed an antibiotic medicine, take it as told by your health care provider. Do not stop taking the antibiotic even if you start to feel better.  Sleep in a semi-upright position at night. Try sleeping in a reclining chair, or place a few pillows under your head.  Do not use tobacco products, including cigarettes, chewing tobacco, and e-cigarettes. If you need help quitting, ask your health care provider.  Drink enough water to keep your urine clear or pale yellow. This will help to thin out mucus secretions in your lungs. How  is this prevented? There are ways that you can decrease your risk of developing community-acquired pneumonia. Consider getting a pneumococcal vaccine if:  You are older than 58 years of age.  You are older than 58 years of age and are undergoing cancer treatment, have chronic lung disease, or have other medical conditions that affect your immune system. Ask your health care provider if this applies to you.  There are different types and schedules of pneumococcal vaccines. Ask your health care provider which vaccination option is best for you. You may also prevent community-acquired pneumonia if you take these actions:  Get an influenza vaccine every year. Ask your health care provider which type of influenza vaccine is best for you.  Go to the dentist on a regular basis.  Wash your hands often. Use hand sanitizer if soap and water are not available.  Contact a health care provider if:  You have a fever.  You are losing sleep because you cannot control your cough with cough medicine. Get help right away if:  You have worsening shortness of breath.  You have increased chest pain.  Your sickness becomes worse, especially if you are an older adult or have a weakened immune system.  You cough up blood. This information is not intended to replace advice given to you by your health care provider. Make sure you discuss any questions you have with your health care provider. Document Released: 05/19/2005 Document Revised: 09/27/2015 Document Reviewed: 09/13/2014 Elsevier Interactive Patient Education  [employer].

## 2017-07-07 NOTE — Telephone Encounter (Signed)
[facility]  [phone] Patient is on [place], increases risk of accidental overdose with taking  flexeril.  Can you change to script to something less sedating  Such as zanaflex ? Patient is at pharmacy now.

## 2017-07-07 NOTE — Progress Notes (Signed)
Pre visit review using our clinic review tool, if applicable. No additional management support is needed unless otherwise documented below in the visit note. 

## 2017-07-07 NOTE — Telephone Encounter (Signed)
Please advise 

## 2017-07-08 ENCOUNTER — Telehealth: Payer: Self-pay | Admitting: Internal Medicine

## 2017-07-08 NOTE — Telephone Encounter (Signed)
Patient called requesting another medication to replace flexeril due to the pharmacy will not fill it because of the combination of drugs tramadol and xanax along with flexeril is dangerous. Encounter yesterday from [facility] addresses the same issue. Please advise.

## 2017-07-08 NOTE — Telephone Encounter (Signed)
Copied from [place] [phone]. Topic: Quick Communication - See Telephone Encounter >> Jul 08, 2017  9:34 AM Bea Graff, NT wrote: CRM for notification. See Telephone encounter for: Pt states he went to pick up  cyclobenzaprine (FLEXERIL) at [facility] stated they did not want to feel it because he takes tramadol and occasionally xanax and they stated that was a dangerous combination. Pt wants to see if something else can be prescribed. [facility] on [address]  07/08/17.

## 2017-07-08 NOTE — Telephone Encounter (Signed)
This has already been taken care of yesterday at 1700 by provider.

## 2017-07-09 ENCOUNTER — Encounter: Payer: Self-pay | Admitting: Internal Medicine

## 2017-07-09 ENCOUNTER — Telehealth: Payer: Self-pay | Admitting: Radiology

## 2017-07-09 NOTE — Telephone Encounter (Signed)
Pt coming in for labs tomorrow, please place future orders. Thank you.  
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
## 2017-07-09 NOTE — Telephone Encounter (Signed)
Done. Thank you.

## 2017-07-10 ENCOUNTER — Other Ambulatory Visit: Payer: Self-pay

## 2017-07-10 ENCOUNTER — Telehealth: Payer: Self-pay | Admitting: Radiology

## 2017-07-10 ENCOUNTER — Encounter: Payer: 59 | Attending: Physician Assistant | Admitting: Physician Assistant

## 2017-07-10 ENCOUNTER — Other Ambulatory Visit (INDEPENDENT_AMBULATORY_CARE_PROVIDER_SITE_OTHER): Payer: 59

## 2017-07-10 DIAGNOSIS — G8929 Other chronic pain: Secondary | ICD-10-CM | POA: Diagnosis not present

## 2017-07-10 DIAGNOSIS — R739 Hyperglycemia, unspecified: Secondary | ICD-10-CM

## 2017-07-10 DIAGNOSIS — F419 Anxiety disorder, unspecified: Secondary | ICD-10-CM | POA: Insufficient documentation

## 2017-07-10 DIAGNOSIS — M545 Low back pain: Secondary | ICD-10-CM

## 2017-07-10 DIAGNOSIS — I890 Lymphedema, not elsewhere classified: Secondary | ICD-10-CM | POA: Diagnosis not present

## 2017-07-10 DIAGNOSIS — Z882 Allergy status to sulfonamides status: Secondary | ICD-10-CM | POA: Diagnosis not present

## 2017-07-10 DIAGNOSIS — E6609 Other obesity due to excess calories: Secondary | ICD-10-CM | POA: Insufficient documentation

## 2017-07-10 DIAGNOSIS — Z1159 Encounter for screening for other viral diseases: Secondary | ICD-10-CM

## 2017-07-10 DIAGNOSIS — Z13220 Encounter for screening for lipoid disorders: Secondary | ICD-10-CM

## 2017-07-10 DIAGNOSIS — L97822 Non-pressure chronic ulcer of other part of left lower leg with fat layer exposed: Secondary | ICD-10-CM | POA: Diagnosis not present

## 2017-07-10 DIAGNOSIS — Z113 Encounter for screening for infections with a predominantly sexual mode of transmission: Secondary | ICD-10-CM

## 2017-07-10 DIAGNOSIS — Z1329 Encounter for screening for other suspected endocrine disorder: Secondary | ICD-10-CM | POA: Diagnosis not present

## 2017-07-10 DIAGNOSIS — Z13818 Encounter for screening for other digestive system disorders: Secondary | ICD-10-CM

## 2017-07-10 LAB — COMPREHENSIVE METABOLIC PANEL
ALBUMIN: 3.9 g/dL (ref 3.5–5.2)
ALK PHOS: 74 U/L (ref 39–117)
ALT: 14 U/L (ref 0–53)
AST: 15 U/L (ref 0–37)
BILIRUBIN TOTAL: 0.5 mg/dL (ref 0.2–1.2)
BUN: 10 mg/dL (ref 6–23)
CO2: 32 mEq/L (ref 19–32)
Calcium: 9.2 mg/dL (ref 8.4–10.5)
Chloride: 102 mEq/L (ref 96–112)
Creatinine, Ser: 0.61 mg/dL (ref 0.40–1.50)
GFR: 144.31 mL/min (ref >60.00)
Glucose, Bld: 101 mg/dL — ABNORMAL HIGH (ref 70–99)
Potassium: 4.1 mEq/L (ref 3.5–5.1)
SODIUM: 138 meq/L (ref 135–145)
TOTAL PROTEIN: 6.9 g/dL (ref 6.0–8.3)

## 2017-07-10 LAB — LIPID PANEL
CHOLESTEROL: 172 mg/dL (ref 0–200)
HDL: 67.5 mg/dL (ref >39.00)
LDL Cholesterol: 91 mg/dL (ref 0–99)
NonHDL: 104.51
Total CHOL/HDL Ratio: 3
Triglycerides: 69 mg/dL (ref 0.0–149.0)
VLDL: 13.8 mg/dL (ref 0.0–40.0)

## 2017-07-10 LAB — TSH: TSH: 0.65 u[IU]/mL (ref 0.35–4.50)

## 2017-07-10 LAB — T4, FREE: FREE T4: 0.71 ng/dL (ref 0.60–1.60)

## 2017-07-10 NOTE — Telephone Encounter (Signed)
Pt came in today for labs. Pt stated he would not like to be referred to physical therapy at this time. Pt stated he would let us know if he changed his mind.

## 2017-07-10 NOTE — Addendum Note (Signed)
Addended by: Arby Barrette on: 07/10/2017 02:20 PM   Modules accepted: Orders

## 2017-07-11 LAB — HEPATITIS B SURFACE ANTIGEN: Hepatitis B Surface Ag: NONREACTIVE

## 2017-07-11 LAB — CBC WITH DIFFERENTIAL/PLATELET
Basophils Absolute: 62 cells/uL (ref 0–200)
Basophils Relative: 1.2 %
EOS PCT: 3.9 %
Eosinophils Absolute: 203 cells/uL (ref 15–500)
HCT: 38.3 % — ABNORMAL LOW (ref 38.5–50.0)
Hemoglobin: 13.1 g/dL — ABNORMAL LOW (ref 13.2–17.1)
Lymphs Abs: 1654 cells/uL (ref 850–3900)
MCH: 30.3 pg (ref 27.0–33.0)
MCHC: 34.2 g/dL (ref 32.0–36.0)
MCV: 88.5 fL (ref 80.0–100.0)
MPV: 10 fL (ref 7.5–12.5)
Monocytes Relative: 8.5 %
NEUTROS PCT: 54.6 %
Neutro Abs: 2839 cells/uL (ref 1500–7800)
Platelets: 348 10*3/uL (ref 140–400)
RBC: 4.33 10*6/uL (ref 4.20–5.80)
RDW: 13 % (ref 11.0–15.0)
TOTAL LYMPHOCYTE: 31.8 %
WBC mixed population: 442 cells/uL (ref 200–950)
WBC: 5.2 10*3/uL (ref 3.8–10.8)

## 2017-07-11 LAB — HEPATITIS C ANTIBODY
HEP C AB: NONREACTIVE
SIGNAL TO CUT-OFF: 0.02 (ref <1.00)

## 2017-07-11 LAB — HEMOGLOBIN A1C
Hgb A1c MFr Bld: 5.7 % of total Hgb — ABNORMAL HIGH (ref <5.7)
Mean Plasma Glucose: 117 (calc)
eAG (mmol/L): 6.5 (calc)

## 2017-07-11 LAB — HEPATITIS B SURFACE ANTIBODY, QUANTITATIVE: Hepatitis B-Post: <5 m[IU]/mL — ABNORMAL LOW (ref >=10)

## 2017-07-13 ENCOUNTER — Telehealth: Payer: Self-pay | Admitting: Internal Medicine

## 2017-07-13 ENCOUNTER — Other Ambulatory Visit: Payer: Self-pay | Admitting: Internal Medicine

## 2017-07-13 DIAGNOSIS — M545 Low back pain: Principal | ICD-10-CM

## 2017-07-13 DIAGNOSIS — G8929 Other chronic pain: Secondary | ICD-10-CM

## 2017-07-13 MED ORDER — DICLOFENAC SODIUM 75 MG PO TBEC: 75.0000 mg | DELAYED_RELEASE_TABLET | Freq: Two times a day (BID) | ORAL | 1 refills | Status: DC | PRN

## 2017-07-13 MED ORDER — METHOCARBAMOL 750 MG PO TABS: 750.0000 mg | ORAL_TABLET | Freq: Two times a day (BID) | ORAL | 0 refills | Status: DC | PRN

## 2017-07-13 NOTE — Telephone Encounter (Signed)
Please advise 

## 2017-07-13 NOTE — Telephone Encounter (Signed)
Copied from [facility]. Topic: General - Other >> Jul 13, 2017  8:47 AM Synthia Innocent wrote: Reason for CRM: Patient is stating that the muscle relaxer is not helping, he could like dr to give him a different muscle relaxer. Would like a prescription for diclofenac 75mg . Please advise

## 2017-07-13 NOTE — Telephone Encounter (Signed)
Changed to robaxin and sent diclofenac   Thanks tMS

## 2017-07-13 NOTE — Progress Notes (Signed)
VANNA, SHAVERS ([id number]) Visit Report for 07/10/2017 Arrival Information Details Patient Name: Barbuto, Alekzander C. Date of Service: 07/10/2017 3:30 PM Medical Record Number: [id number] Patient Account Number: [id number] Date of Birth/Sex: [birth year] (57 y.o. Male) Treating RN: Roger Shelter Primary Care Jenette Rayson: [name] Other Clinician: Referring Merit Gadsby: Nelson Chimes Treating Jonn Chaikin/Extender: [name], [name] Weeks in Treatment: 3 Visit Information History Since Last Visit All ordered tests and consults were completed: No Patient Arrived: Ambulatory Added or deleted any medications: No Arrival Time: 15:43 Any new allergies or adverse reactions: No Accompanied By: self Had a fall or experienced change in No Transfer Assistance: None activities of daily living that may affect Patient Identification Verified: Yes risk of falls: Secondary Verification Process Completed: Yes Signs or symptoms of abuse/neglect since last visito No Patient Requires Transmission-Based No Hospitalized since last visit: No Precautions: Pain Present Now: No Patient Has Alerts: No Electronic Signature(s) Signed: 07/10/2017 4:49:48 PM By: Roger Shelter Entered By: Roger Shelter on 07/10/2017 15:44:16 Billet, Creek C. ([id number]) -------------------------------------------------------------------------------- Encounter Discharge Information Details Patient Name: Stroschein, Antonino C. Date of Service: 07/10/2017 3:30 PM Medical Record Number: [id number] Patient Account Number: [id number] Date of Birth/Sex: 02/23/1961 (57 y.o. Male) Treating RN: Roger Shelter Primary Care Ryan Ogborn: [name] Other Clinician: Referring Lawrence Mitch: Nelson Chimes Treating Carrigan Delafuente/Extender: [name], [name] Weeks in Treatment: 3 Encounter Discharge Information Items Discharge Pain Level: 0 Discharge Condition: Stable Ambulatory Status: Ambulatory Discharge Destination:  Home Private Transportation: Auto Accompanied By: self Schedule Follow-up Appointment: Yes Medication Reconciliation completed and provided Yes to Patient/Care Sherrika Weakland: Clinical Summary of Care: Electronic Signature(s) Signed: 07/10/2017 4:49:48 PM By: Roger Shelter Entered By: Roger Shelter on 07/10/2017 16:23:04 Belko, Konrad C. ([id number]) -------------------------------------------------------------------------------- Lower Extremity Assessment Details Patient Name: Dibble, Akil C. Date of Service: 07/10/2017 3:30 PM Medical Record Number: [id number] Patient Account Number: [id number] Date of Birth/Sex: December 06, 1960 (57 y.o. Male) Treating RN: Roger Shelter Primary Care Jaden Abreu: [name] Other Clinician: Referring Anselma Herbel: Nelson Chimes Treating Torian Quintero/Extender: [name], [name] Weeks in Treatment: 3 Edema Assessment Assessed: [Left: No] [Right: No] [Left: Edema] [Right: :] Calf Left: Right: Point of Measurement: 37 cm From Medial Instep 45 cm cm Ankle Left: Right: Point of Measurement: 12 cm From Medial Instep 28.6 cm cm Vascular Assessment Claudication: Claudication Assessment [Left:None] Pulses: Dorsalis Pedis Palpable: [Left:Yes] Posterior Tibial Extremity colors, hair growth, and conditions: Extremity Color: [Left:Normal] Hair Growth on Extremity: [Left:No] Temperature of Extremity: [Left:Cool] Capillary Refill: [Left:< 3 seconds] Toe Nail Assessment Left: Right: Thick: Yes Discolored: Yes Deformed: No Improper Length and Hygiene: Yes Electronic Signature(s) Signed: 07/10/2017 4:49:48 PM By: Roger Shelter Entered By: Roger Shelter on 07/10/2017 15:53:40 Altice, Joseantonio C. ([id number]) -------------------------------------------------------------------------------- Multi Wound Chart Details Patient Name: Pogue, Cadan C. Date of Service: 07/10/2017 3:30 PM Medical Record Number: [id number] Patient Account Number: [id number] Date of Birth/Sex:  [birth year] (57 y.o. Male) Treating RN: Roger Shelter Primary Care Adama Ivins: [name] Other Clinician: Referring Malachi Suderman: Nelson Chimes Treating Hunter Pinkard/Extender: STONE III, [name] Weeks in Treatment: 3 Vital Signs Height(in): 75 Pulse(bpm): 77 Weight(lbs): 179 Blood Pressure(mmHg): 133/85 Body Mass Index(BMI): 22 Temperature(F): 98.4 Respiratory Rate 18 (breaths/min): Photos: [1:No Photos] [2:No Photos] [3:No Photos] Wound Location: [1:Left Lower Leg - Medial, Proximal] [2:Left Lower Leg - Midline] [3:Left Lower Leg - Midline, Distal] Wounding Event: [1:Gradually Appeared] [2:Gradually Appeared] [3:Gradually Appeared] Primary Etiology: [1:Lymphedema] [2:Lymphedema] [3:Lymphedema] Comorbid History: [1:Sleep Apnea, Confinement Anxiety] [2:Sleep Apnea, Confinement Anxiety] [3:Sleep Apnea, Confinement Anxiety] Date Acquired: [1:01/17/2017] [2:01/17/2017] [3:01/17/2017] Weeks of Treatment: [1:3] [2:3] [3:3] Wound Status: [1:Open] [  2:Open] [3:Open] Measurements L x W x D [1:0.8x0.8x0.1] [2:2.5x2.4x0.1] [3:0.2x0.2x0.1] (cm) Area (cm) : [1:0.503] [2:4.712] [3:0.031] Volume (cm) : [1:0.05] [2:0.471] [3:0.003] % Reduction in Area: [1:27.20%] [2:7.70%] [3:75.40%] % Reduction in Volume: [1:27.50%] [2:7.80%] [3:76.90%] Classification: [1:Full Thickness Without Exposed Support Structures] [2:Full Thickness Without Exposed Support Structures] [3:Partial Thickness] Exudate Amount: [1:Medium] [2:Large] [3:Small] Exudate Type: [1:Serosanguineous] [2:Serosanguineous] [3:Serosanguineous] Exudate Color: [1:red, brown] [2:red, brown] [3:red, brown] Wound Margin: [1:Flat and Intact] [2:Flat and Intact] [3:Flat and Intact] Granulation Amount: [1:Small (1-33%)] [2:Small (1-33%)] [3:Small (1-33%)] Granulation Quality: [1:Red] [2:Red] [3:Red] Necrotic Amount: [1:Large (67-100%)] [2:Large (67-100%)] [3:Large (67-100%)] Exposed Structures: [1:Fat Layer (Subcutaneous Tissue) Exposed:  Yes Fascia: No Tendon: No Muscle: No Joint: No Bone: No] [2:Fat Layer (Subcutaneous Tissue) Exposed: Yes Fascia: No Tendon: No Muscle: No Joint: No Bone: No] [3:Fat Layer (Subcutaneous Tissue) Exposed: Yes  Fascia: No Tendon: No Muscle: No Joint: No Bone: No] Epithelialization: [1:None] [2:None] [3:None] Periwound Skin Texture: [1:Excoriation: Yes Scarring: Yes Induration: No Callus: No] [2:Excoriation: Yes Induration: No Callus: No Crepitus: No] [3:Excoriation: Yes Induration: No Callus: No Crepitus: No] Crepitus: No Rash: No Rash: No Rash: No Scarring: No Scarring: No Periwound Skin Moisture: Maceration: No Maceration: No Maceration: No Dry/Scaly: No Dry/Scaly: No Dry/Scaly: No Periwound Skin Color: Erythema: Yes Erythema: Yes Erythema: Yes Atrophie Blanche: No Atrophie Blanche: No Atrophie Blanche: No Cyanosis: No Cyanosis: No Cyanosis: No Ecchymosis: No Ecchymosis: No Ecchymosis: No Hemosiderin Staining: No Hemosiderin Staining: No Hemosiderin Staining: No Mottled: No Mottled: No Mottled: No Pallor: No Pallor: No Pallor: No Rubor: No Rubor: No Rubor: No Erythema Location: Circumferential Circumferential Circumferential Temperature: No Abnormality No Abnormality No Abnormality Tenderness on Palpation: Yes Yes Yes Wound Preparation: Ulcer Cleansing: Ulcer Cleansing: Ulcer Cleansing: Rinsed/Irrigated with Saline Rinsed/Irrigated with Saline Rinsed/Irrigated with Saline Topical Anesthetic Applied: Topical Anesthetic Applied: Topical Anesthetic Applied: Other: lidocaine 4% Other: lisdocaine 4% Other: lidocaine 4% Wound Number: 4 N/A N/A Photos: No Photos N/A N/A Wound Location: Left, Medial Foot N/A N/A Wounding Event: Gradually Appeared N/A N/A Primary Etiology: Lymphedema N/A N/A Comorbid History: N/A N/A N/A Date Acquired: 06/27/2017 N/A N/A Weeks of Treatment: 1 N/A N/A Wound Status: Healed - Epithelialized N/A N/A Measurements L x W x D 0x0x0 N/A  N/A (cm) Area (cm) : 0 N/A N/A Volume (cm) : 0 N/A N/A % Reduction in Area: 100.00% N/A N/A % Reduction in Volume: 100.00% N/A N/A Classification: Partial Thickness N/A N/A Exudate Amount: N/A N/A N/A Exudate Type: N/A N/A N/A Exudate Color: N/A N/A N/A Wound Margin: N/A N/A N/A Granulation Amount: N/A N/A N/A Granulation Quality: N/A N/A N/A Necrotic Amount: N/A N/A N/A Exposed Structures: N/A N/A N/A Epithelialization: N/A N/A N/A Periwound Skin Texture: No Abnormalities Noted N/A N/A Periwound Skin Moisture: No Abnormalities Noted N/A N/A Periwound Skin Color: No Abnormalities Noted N/A N/A Erythema Location: N/A N/A N/A Temperature: N/A N/A N/A Tenderness on Palpation: No N/A N/A Wound Preparation: N/A N/A N/A Treatment Notes MICHAH, MINTON ([id number]) Electronic Signature(s) Signed: 07/10/2017 4:49:48 PM By: Roger Shelter Entered By: Roger Shelter on 07/10/2017 16:06:15 Magid, Christan Loletha Grayer ([id number]) -------------------------------------------------------------------------------- [place] Details Patient Name: Renovato, Ariel C. Date of Service: 07/10/2017 3:30 PM Medical Record Number: [id number] Patient Account Number: [id number] Date of Birth/Sex: 1960/07/11 (57 y.o. Male) Treating RN: Roger Shelter Primary Care Marq Rebello: [name] Other Clinician: Referring Rashay Barnette: Nelson Chimes Treating Karyna Bessler/Extender: [name], [name] Weeks in Treatment: 3 Active Inactive ` Orientation to the Wound Care Program Nursing Diagnoses: Knowledge deficit related to  the wound healing center program Goals: Patient/caregiver will verbalize understanding of the [place] Date Initiated: 06/19/2017 Target Resolution Date: 07/10/2017 Goal Status: Active Interventions: Provide education on orientation to the wound center Notes: ` Wound/Skin Impairment Nursing Diagnoses: Impaired tissue integrity Goals: Patient/caregiver will  verbalize understanding of skin care regimen Date Initiated: 06/19/2017 Target Resolution Date: 07/10/2017 Goal Status: Active Ulcer/skin breakdown will have a volume reduction of 30% by week 4 Date Initiated: 06/19/2017 Target Resolution Date: 07/10/2017 Goal Status: Active Interventions: Assess patient/caregiver ability to obtain necessary supplies Assess patient/caregiver ability to perform ulcer/skin care regimen upon admission and as needed Assess ulceration(s) every visit Treatment Activities: Skin care regimen initiated : 06/19/2017 Notes: Electronic Signature(s) Signed: 07/10/2017 4:49:48 PM By: Aquilla Hacker, Mechele Claude ([id number]) Entered By: Roger Shelter on 07/10/2017 16:06:07 Malizia, Nai C. ([id number]) -------------------------------------------------------------------------------- Pain Assessment Details Patient Name: Vinluan, Kamryn C. Date of Service: 07/10/2017 3:30 PM Medical Record Number: [id number] Patient Account Number: [id number] Date of Birth/Sex: Jun 07, 1960 (57 y.o. Male) Treating RN: Roger Shelter Primary Care Kristal Perl: [name] Other Clinician: Referring Kemaya Dorner: Nelson Chimes Treating Destynie Toomey/Extender: [name], [name] Weeks in Treatment: 3 Active Problems Location of Pain Severity and Description of Pain Patient Has Paino No Site Locations Pain Management and Medication Current Pain Management: Electronic Signature(s) Signed: 07/10/2017 4:49:48 PM By: Roger Shelter Entered By: Roger Shelter on 07/10/2017 15:44:29 Salsberry, Zuhair Loletha Grayer ([id number]) -------------------------------------------------------------------------------- Patient/Caregiver Education Details Patient Name: Bissette, Chrishun C. Date of Service: 07/10/2017 3:30 PM Medical Record Number: [id number] Patient Account Number: [id number] Date of Birth/Gender: 1961-04-01 (57 y.o. Male) Treating RN: Roger Shelter Primary Care Physician: [name] Other Clinician: Referring  Physician: Nelson Chimes Treating Physician/Extender: Sharalyn Ink in Treatment: 3 Education Assessment Education Provided To: Patient Education Topics Provided Wound/Skin Impairment: Handouts: Caring for Your Ulcer Methods: Explain/Verbal Responses: State content correctly Electronic Signature(s) Signed: 07/10/2017 4:49:48 PM By: Roger Shelter Entered By: Roger Shelter on 07/10/2017 16:23:15 Narine, Zayon C. ([id number]) -------------------------------------------------------------------------------- Wound Assessment Details Patient Name: Vogt, Glendel C. Date of Service: 07/10/2017 3:30 PM Medical Record Number: [id number] Patient Account Number: [id number] Date of Birth/Sex: 14-Mar-1961 (57 y.o. Male) Treating RN: Roger Shelter Primary Care Collyns Mcquigg: [name] Other Clinician: Referring Anita Mcadory: Nelson Chimes Treating Charlot Gouin/Extender: STONE III, [name] Weeks in Treatment: 3 Wound Status Wound Number: 1 Primary Etiology: Lymphedema Wound Location: Left Lower Leg - Medial, Proximal Wound Status: Open Wounding Event: Gradually Appeared Comorbid History: Sleep Apnea, Confinement Anxiety Date Acquired: 01/17/2017 Weeks Of Treatment: 3 Clustered Wound: No Photos Photo Uploaded By: Roger Shelter on 07/10/2017 16:41:27 Wound Measurements Length: (cm) 0.8 Width: (cm) 0.8 Depth: (cm) 0.1 Area: (cm) 0.503 Volume: (cm) 0.05 % Reduction in Area: 27.2% % Reduction in Volume: 27.5% Epithelialization: None Tunneling: No Undermining: No Wound Description Full Thickness Without Exposed Support Classification: Structures Wound Margin: Flat and Intact Exudate Medium Amount: Exudate Type: Serosanguineous Exudate Color: red, brown Foul Odor After Cleansing: No Slough/Fibrino Yes Wound Bed Granulation Amount: Small (1-33%) Exposed Structure Granulation Quality: Red Fascia Exposed: No Necrotic Amount: Large (67-100%) Fat Layer (Subcutaneous  Tissue) Exposed: Yes Necrotic Quality: Adherent Slough Tendon Exposed: No Muscle Exposed: No Joint Exposed: No Bone Exposed: No Casamento, Jaxzen C. ([id number]) Periwound Skin Texture Texture Color No Abnormalities Noted: No No Abnormalities Noted: No Callus: No Atrophie Blanche: No Crepitus: No Cyanosis: No Excoriation: Yes Ecchymosis: No Induration: No Erythema: Yes Rash: No Erythema Location: Circumferential Scarring: Yes Hemosiderin Staining: No Mottled: No Moisture Pallor: No No Abnormalities Noted: No Rubor: No  Dry / Scaly: No Maceration: No Temperature / Pain Temperature: No Abnormality Tenderness on Palpation: Yes Wound Preparation Ulcer Cleansing: Rinsed/Irrigated with Saline Topical Anesthetic Applied: Other: lidocaine 4%, Treatment Notes Wound #1 (Left, Proximal, Medial Lower Leg) 1. Cleansed with: Clean wound with Normal Saline 2. Anesthetic Topical Lidocaine 4% cream to wound bed prior to debridement Hurricaine Topical Anesthetic Spray 4. Dressing Applied: Santyl Ointment 5. Secondary Dressing Applied Non-Adherent pad 7. Secured with Tape Notes apply saline gauze over santyl Electronic Signature(s) Signed: 07/10/2017 3:57:46 PM By: Roger Shelter Entered By: Roger Shelter on 07/10/2017 15:57:46 Soberano, Tyronn C. ([id number]) -------------------------------------------------------------------------------- Wound Assessment Details Patient Name: Osinski, Dyon C. Date of Service: 07/10/2017 3:30 PM Medical Record Number: [id number] Patient Account Number: [id number] Date of Birth/Sex: 1960/12/21 (57 y.o. Male) Treating RN: Roger Shelter Primary Care Keygan Dumond: [name] Other Clinician: Referring Neesha Langton: Nelson Chimes Treating Veeda Virgo/Extender: STONE III, [name] Weeks in Treatment: 3 Wound Status Wound Number: 2 Primary Etiology: Lymphedema Wound Location: Left Lower Leg - Midline Wound Status: Open Wounding Event: Gradually  Appeared Comorbid History: Sleep Apnea, Confinement Anxiety Date Acquired: 01/17/2017 Weeks Of Treatment: 3 Clustered Wound: No Photos Photo Uploaded By: Roger Shelter on 07/10/2017 16:41:44 Wound Measurements Length: (cm) 2.5 Width: (cm) 2.4 Depth: (cm) 0.1 Area: (cm) 4.712 Volume: (cm) 0.471 % Reduction in Area: 7.7% % Reduction in Volume: 7.8% Epithelialization: None Tunneling: No Undermining: No Wound Description Full Thickness Without Exposed Support Classification: Structures Wound Margin: Flat and Intact Exudate Large Amount: Exudate Type: Serosanguineous Exudate Color: red, brown Foul Odor After Cleansing: No Slough/Fibrino Yes Wound Bed Granulation Amount: Small (1-33%) Exposed Structure Granulation Quality: Red Fascia Exposed: No Necrotic Amount: Large (67-100%) Fat Layer (Subcutaneous Tissue) Exposed: Yes Necrotic Quality: Adherent Slough Tendon Exposed: No Muscle Exposed: No Joint Exposed: No Bone Exposed: No Lemoine, Avrian C. ([id number]) Periwound Skin Texture Texture Color No Abnormalities Noted: No No Abnormalities Noted: No Callus: No Atrophie Blanche: No Crepitus: No Cyanosis: No Excoriation: Yes Ecchymosis: No Induration: No Erythema: Yes Rash: No Erythema Location: Circumferential Scarring: No Hemosiderin Staining: No Mottled: No Moisture Pallor: No No Abnormalities Noted: No Rubor: No Dry / Scaly: No Maceration: No Temperature / Pain Temperature: No Abnormality Tenderness on Palpation: Yes Wound Preparation Ulcer Cleansing: Rinsed/Irrigated with Saline Topical Anesthetic Applied: Other: lisdocaine 4%, Treatment Notes Wound #2 (Left, Midline Lower Leg) 1. Cleansed with: Clean wound with Normal Saline 2. Anesthetic Topical Lidocaine 4% cream to wound bed prior to debridement Hurricaine Topical Anesthetic Spray 4. Dressing Applied: Santyl Ointment 5. Secondary Dressing Applied Non-Adherent pad 7. Secured  with Tape Notes apply saline gauze over santyl Electronic Signature(s) Signed: 07/10/2017 3:58:10 PM By: Roger Shelter Entered By: Roger Shelter on 07/10/2017 15:58:10 Mcevers, Yeray C. ([id number]) -------------------------------------------------------------------------------- Wound Assessment Details Patient Name: Brott, Yazan C. Date of Service: 07/10/2017 3:30 PM Medical Record Number: [id number] Patient Account Number: [id number] Date of Birth/Sex: 24-Jun-1960 (57 y.o. Male) Treating RN: Roger Shelter Primary Care Charla Criscione: [name] Other Clinician: Referring Tosha Belgarde: Nelson Chimes Treating Willeen Novak/Extender: STONE III, [name] Weeks in Treatment: 3 Wound Status Wound Number: 3 Primary Etiology: Lymphedema Wound Location: Left Lower Leg - Midline, Distal Wound Status: Open Wounding Event: Gradually Appeared Comorbid History: Sleep Apnea, Confinement Anxiety Date Acquired: 01/17/2017 Weeks Of Treatment: 3 Clustered Wound: No Photos Photo Uploaded By: Roger Shelter on 07/10/2017 16:42:13 Wound Measurements Length: (cm) 0.2 Width: (cm) 0.2 Depth: (cm) 0.1 Area: (cm) 0.031 Volume: (cm) 0.003 % Reduction in Area: 75.4% % Reduction in Volume: 76.9% Epithelialization: None Tunneling: No  Undermining: No Wound Description Classification: Partial Thickness Wound Margin: Flat and Intact Exudate Amount: Small Exudate Type: Serosanguineous Exudate Color: red, brown Foul Odor After Cleansing: No Slough/Fibrino Yes Wound Bed Granulation Amount: Small (1-33%) Exposed Structure Granulation Quality: Red Fascia Exposed: No Necrotic Amount: Large (67-100%) Fat Layer (Subcutaneous Tissue) Exposed: Yes Necrotic Quality: Adherent Slough Tendon Exposed: No Muscle Exposed: No Joint Exposed: No Bone Exposed: No Periwound Skin Texture Fichera, Aster C. ([id number]) Texture Color No Abnormalities Noted: No No Abnormalities Noted: No Callus: No Atrophie Blanche:  No Crepitus: No Cyanosis: No Excoriation: Yes Ecchymosis: No Induration: No Erythema: Yes Rash: No Erythema Location: Circumferential Scarring: No Hemosiderin Staining: No Mottled: No Moisture Pallor: No No Abnormalities Noted: No Rubor: No Dry / Scaly: No Maceration: No Temperature / Pain Temperature: No Abnormality Tenderness on Palpation: Yes Wound Preparation Ulcer Cleansing: Rinsed/Irrigated with Saline Topical Anesthetic Applied: Other: lidocaine 4%, Treatment Notes Wound #3 (Left, Distal, Midline Lower Leg) 1. Cleansed with: Clean wound with Normal Saline 2. Anesthetic Topical Lidocaine 4% cream to wound bed prior to debridement 4. Dressing Applied: Prisma Ag 5. Secondary Dressing Applied Non-Adherent pad Electronic Signature(s) Signed: 07/10/2017 3:58:36 PM By: Roger Shelter Entered By: Roger Shelter on 07/10/2017 15:58:36 Simerly, Lamario C. ([id number]) -------------------------------------------------------------------------------- Wound Assessment Details Patient Name: Pelissier, Cletus C. Date of Service: 07/10/2017 3:30 PM Medical Record Number: [id number] Patient Account Number: [id number] Date of Birth/Sex: [birth year] (57 y.o. Male) Treating RN: Roger Shelter Primary Care Shakenna Herrero: [name] Other Clinician: Referring Damonte Frieson: Nelson Chimes Treating Jurnei Latini/Extender: STONE III, [name] Weeks in Treatment: 3 Wound Status Wound Number: 4 Primary Etiology: Lymphedema Wound Location: Left, Medial Foot Wound Status: Healed - Epithelialized Wounding Event: Gradually Appeared Date Acquired: 06/27/2017 Weeks Of Treatment: 1 Clustered Wound: No Photos Photo Uploaded By: Roger Shelter on 07/10/2017 16:42:30 Wound Measurements Length: (cm) 0 Width: (cm) 0 Depth: (cm) 0 Area: (cm) 0 Volume: (cm) 0 % Reduction in Area: 100% % Reduction in Volume: 100% Wound Description Classification: Partial Thickness Periwound Skin Texture Texture  Color No Abnormalities Noted: No No Abnormalities Noted: No Moisture No Abnormalities Noted: No Electronic Signature(s) Signed: 07/10/2017 4:49:48 PM By: Roger Shelter Entered By: Roger Shelter on 07/10/2017 15:55:36 Burciaga, Shana C. ([id number]) -------------------------------------------------------------------------------- Vitals Details Patient Name: Manasco, Akshath C. Date of Service: 07/10/2017 3:30 PM Medical Record Number: [id number] Patient Account Number: [id number] Date of Birth/Sex: Aug 13, 1960 (57 y.o. Male) Treating RN: Roger Shelter Primary Care Kacin Dancy: [name] Other Clinician: Referring Ala Capri: Nelson Chimes Treating Darcella Shiffman/Extender: STONE III, [name] Weeks in Treatment: 3 Vital Signs Time Taken: 03:44 Temperature (F): 98.4 Height (in): 75 Pulse (bpm): 77 Weight (lbs): 179 Respiratory Rate (breaths/min): 18 Body Mass Index (BMI): 22.4 Blood Pressure (mmHg): 133/85 Reference Range: 80 - 120 mg / dl Electronic Signature(s) Signed: 07/10/2017 4:49:48 PM By: Roger Shelter Entered By: Roger Shelter on 07/10/2017 15:45:02

## 2017-07-14 ENCOUNTER — Other Ambulatory Visit: Payer: Self-pay | Admitting: *Deleted

## 2017-07-14 ENCOUNTER — Telehealth: Payer: Self-pay | Admitting: Internal Medicine

## 2017-07-14 NOTE — Telephone Encounter (Unsigned)
Copied from [place] [id number]. Topic: Quick Communication - See Telephone Encounter >> Jul 14, 2017 10:45 AM Hewitt Shorts wrote: CRM for notification. See Telephone encounter for: pt is asking for something else for muscle relaxer the zanaflex is not working for his back   [address] [phone]  07/14/17.

## 2017-07-14 NOTE — Telephone Encounter (Signed)
I think this should have been sent to you

## 2017-07-14 NOTE — Telephone Encounter (Signed)
Pt.notified

## 2017-07-14 NOTE — Telephone Encounter (Signed)
On 07/13/17: Changed to robaxin and sent diclofenac   Thanks tMS

## 2017-07-15 NOTE — Progress Notes (Signed)
KYRI, DAI ([id number]) Visit Report for 07/10/2017 Chief Complaint Document Details Patient Name: [name], [name] Date of Service: 07/10/2017 3:30 PM Medical Record Number: [id number] Patient Account Number: [id number] Date of Birth/Sex: May 14, 1961 (57 y.o. Male) Treating RN: Roger Shelter Primary Care Provider: [name] Other Clinician: Referring Provider: Nelson Chimes Treating Provider/Extender: [name], [name] Weeks in Treatment: 3 Information Obtained from: Patient Chief Complaint He is here in follow up for lle venous ulcers Electronic Signature(s) Signed: 07/13/2017 9:06:28 AM By: Worthy Keeler PA-C Entered By: Worthy Keeler on 07/10/2017 15:44:08 Oran, Edoardo C. ([id number]) -------------------------------------------------------------------------------- Debridement Details Patient Name: [name], [name] Date of Service: 07/10/2017 3:30 PM Medical Record Number: [id number] Patient Account Number: [id number] Date of Birth/Sex: [birth year] (57 y.o. Male) Treating RN: Roger Shelter Primary Care Provider: [name] Other Clinician: Referring Provider: Nelson Chimes Treating Provider/Extender: [name], [name] Weeks in Treatment: 3 Debridement Performed for Wound #2 Left,Midline Lower Leg Assessment: Performed By: Physician STONE III, [name] E., PA-C Debridement: Debridement Pre-procedure Verification/Time Yes - 04:00 Out Taken: Start Time: 04:00 Pain Control: Other : lidocaine 4% Level: Skin/Subcutaneous Tissue Total Area Debrided (L x W): 2.5 (cm) x 2.4 (cm) = 6 (cm) Tissue and other material Viable, Non-Viable, Fibrin/Slough, Skin, Subcutaneous debrided: Instrument: Curette Bleeding: Minimum Hemostasis Achieved: Pressure End Time: 04:02 Procedural Pain: 1 Post Procedural Pain: 0 Response to Treatment: Procedure was tolerated well Post Debridement Measurements of Total Wound Length: (cm) 2.5 Width: (cm) 2.4 Depth: (cm) 0.2 Volume:  (cm) 0.942 Character of Wound/Ulcer Post Debridement: Stable Post Procedure Diagnosis Same as Pre-procedure Electronic Signature(s) Signed: 07/10/2017 4:49:48 PM By: Roger Shelter Signed: 07/13/2017 9:06:28 AM By: Worthy Keeler PA-C Entered By: Roger Shelter on 07/10/2017 16:20:38 Raska, Jah C. ([id number]) -------------------------------------------------------------------------------- Debridement Details Patient Name: [name], [name] Date of Service: 07/10/2017 3:30 PM Medical Record Number: [id number] Patient Account Number: [id number] Date of Birth/Sex: 14-Mar-1961 (57 y.o. Male) Treating RN: Roger Shelter Primary Care Provider: [name] Other Clinician: Referring Provider: Nelson Chimes Treating Provider/Extender: [name], [name] Weeks in Treatment: 3 Debridement Performed for Wound #1 Left,Proximal,Medial Lower Leg Assessment: Performed By: Physician STONE III, [name] E., PA-C Debridement: Debridement Pre-procedure Verification/Time Yes - 04:00 Out Taken: Start Time: 04:00 Pain Control: Other : lidocaine 4% Level: Skin/Subcutaneous Tissue Total Area Debrided (L x W): 0.8 (cm) x 0.8 (cm) = 0.64 (cm) Tissue and other material Viable, Non-Viable, Fibrin/Slough, Skin, Subcutaneous debrided: Instrument: Curette Bleeding: Minimum Hemostasis Achieved: Pressure End Time: 04:02 Procedural Pain: 1 Post Procedural Pain: 0 Response to Treatment: Procedure was tolerated well Post Debridement Measurements of Total Wound Length: (cm) 0.8 Width: (cm) 0.8 Depth: (cm) 0.1 Volume: (cm) 0.05 Character of Wound/Ulcer Post Debridement: Stable Post Procedure Diagnosis Same as Pre-procedure Electronic Signature(s) Signed: 07/10/2017 4:49:48 PM By: Roger Shelter Signed: 07/13/2017 9:06:28 AM By: Worthy Keeler PA-C Entered By: Roger Shelter on 07/10/2017 16:21:12 Fonder, Jasiyah C.  ([id number]) -------------------------------------------------------------------------------- HPI Details Patient Name: [name], [name] Date of Service: 07/10/2017 3:30 PM Medical Record Number: [id number] Patient Account Number: [id number] Date of Birth/Sex: 1961/01/25 (57 y.o. Male) Treating RN: Roger Shelter Primary Care Provider: [name] Other Clinician: Referring Provider: Nelson Chimes Treating Provider/Extender: [name], [name] Weeks in Treatment: 3 History of Present Illness HPI Description: 06/25/17-he is here in follow-up evaluation for left lower extremity, medial malleolus, venous ulcers. He is compliant with compression stocking where although his compression stocking does not fit appropriately in length. He has been advised to contact elastic therapy, who measured and  provided his current stockings, regarding a more appropriate fitted stocking. He is voicing no complaints or concerns, tolerated debridement. There is improvement in appearance, no significant change in measurements. We will continue with Santyl ointment, will apply to all 3 wounds. He will follow-up next week 06/30/17-he is here earlier than his scheduled appointment on Friday, per his request, secondary to new open areas. After inquiry, it appears that the new superficial open areas are secondary to adhesive removal. There is no evidence of infection, minimal/scant drainage, most of the new areas are epithelialized. His chronic wounds are stable in appearance, will continue with Santyl. He wants to maintain Friday appointment and will follow-up next Friday, he was encouraged to contact clinic with any ulcer changes or concerns 07/10/17 on evaluation today patient appears to be doing better in regard to his lower extremity ulcers. Two of the four areas show signs of a lot of improvement there is very little slough noted at this point. In fact this was able to be easily cleaned off with saline and  gauze. There was more appearance slough noted however in the area of the proximal ulcers. He does have pain at these locations that's well. He has continued to use the Santyl. Electronic Signature(s) Signed: 07/13/2017 9:06:28 AM By: Worthy Keeler PA-C Entered By: Worthy Keeler on 07/10/2017 17:16:44 Milewski, Ansh CMarland Kitchen ([id number]) -------------------------------------------------------------------------------- Physical Exam Details Patient Name: Digilio, Roosvelt C. Date of Service: 07/10/2017 3:30 PM Medical Record Number: [id number] Patient Account Number: [id number] Date of Birth/Sex: [birth year] (57 y.o. Male) Treating RN: Roger Shelter Primary Care Provider: [name] Other Clinician: Referring Provider: Nelson Chimes Treating Provider/Extender: STONE III, [name] Weeks in Treatment: 3 Constitutional Well-nourished and well-hydrated in no acute distress. Respiratory normal breathing without difficulty. Psychiatric this patient is able to make decisions and demonstrates good insight into disease process. Alert and Oriented x 3. pleasant and cooperative. Notes Patient to proximal ulcers showed evidence of Slough covering in this did require sharp debridement today which he tolerated without complication. Post debridement the wound appeared better although there was still slough noted that needs to be removed. That is why we're gonna continue with the Santyl to help with this. Electronic Signature(s) Signed: 07/13/2017 9:06:28 AM By: Worthy Keeler PA-C Entered By: Worthy Keeler on 07/10/2017 17:17:27 Nelles, Mackay Loletha Grayer ([id number]) -------------------------------------------------------------------------------- Physician Orders Details Patient Name: Ogas, Kinte C. Date of Service: 07/10/2017 3:30 PM Medical Record Number: [id number] Patient Account Number: [id number] Date of Birth/Sex: 12-23-1960 (57 y.o. Male) Treating RN: Roger Shelter Primary Care Provider: [name] Other Clinician: Referring Provider: Nelson Chimes Treating Provider/Extender: [name], [name] Weeks in Treatment: 3 Verbal / Phone Orders: No Diagnosis Coding ICD-10 Coding Code Description I89.0 Lymphedema, not elsewhere classified L97.822 Non-pressure chronic ulcer of other part of left lower leg with fat layer exposed E66.09 Other obesity due to excess calories Wound Cleansing Wound #1 Left,Proximal,Medial Lower Leg o Clean wound with Normal Saline. Wound #2 Left,Midline Lower Leg o Clean wound with Normal Saline. Wound #3 Left,Distal,Midline Lower Leg o Clean wound with Normal Saline. Anesthetic (add to Medication List) Wound #1 Left,Proximal,Medial Lower Leg o Topical Lidocaine 4% cream applied to wound bed prior to debridement (In Clinic Only). Wound #2 Left,Midline Lower Leg o Topical Lidocaine 4% cream applied to wound bed prior to debridement (In Clinic Only). Wound #3 Left,Distal,Midline Lower Leg o Topical Lidocaine 4% cream applied to wound bed prior to debridement (In Clinic Only). Primary Wound Dressing Wound #3 Left,Distal,Midline Lower Leg   o Prisma Ag - moisten with saline Wound #1 Left,Proximal,Medial Lower Leg o Santyl Ointment - apply saline soaked gauze over santyl Wound #2 Left,Midline Lower Leg o Santyl Ointment - apply saline soaked gauze over santyl Secondary Dressing Wound #1 Left,Proximal,Medial Lower Leg o Non-adherent pad - secured with tape Wound #2 Left,Midline Lower Leg o Non-adherent pad - secured with tape Obriant, Woodroe C. ([id number]) Wound #3 Left,Distal,Midline Lower Leg o Non-adherent pad - secured with tape Dressing Change Frequency Wound #1 Left,Proximal,Medial Lower Leg o Change dressing every day. Wound #2 Left,Midline Lower Leg o Change dressing every day. Wound #3 Left,Distal,Midline Lower Leg o Change dressing every day. Follow-up Appointments Wound #1 Left,Proximal,Medial Lower Leg o  Return Appointment in 2 weeks. Wound #2 Left,Midline Lower Leg o Return Appointment in 2 weeks. Wound #3 Left,Distal,Midline Lower Leg o Return Appointment in 2 weeks. Edema Control o Patient to wear own compression stockings Electronic Signature(s) Signed: 07/10/2017 4:49:48 PM By: Roger Shelter Signed: 07/13/2017 9:06:28 AM By: Worthy Keeler PA-C Entered By: Roger Shelter on 07/10/2017 16:19:14 Badilla, Wah C. ([id number]) -------------------------------------------------------------------------------- Problem List Details Patient Name: Volante, Yehudah C. Date of Service: 07/10/2017 3:30 PM Medical Record Number: [id number] Patient Account Number: [id number] Date of Birth/Sex: 08/21/1960 (57 y.o. Male) Treating RN: Roger Shelter Primary Care Provider: [name] Other Clinician: Referring Provider: Nelson Chimes Treating Provider/Extender: [name], [name] Weeks in Treatment: 3 Active Problems ICD-10 Encounter Code Description Active Date Diagnosis I89.0 Lymphedema, not elsewhere classified 06/22/2017 Yes L97.822 Non-pressure chronic ulcer of other part of left lower leg with fat 06/22/2017 Yes layer exposed E66.09 Other obesity due to excess calories 06/22/2017 Yes Inactive Problems Resolved Problems Electronic Signature(s) Signed: 07/13/2017 9:06:28 AM By: Worthy Keeler PA-C Entered By: Worthy Keeler on 07/10/2017 15:44:03 Weems, Macklin C. ([id number]) -------------------------------------------------------------------------------- Progress Note Details Patient Name: Mayse, Kingdom C. Date of Service: 07/10/2017 3:30 PM Medical Record Number: [id number] Patient Account Number: [id number] Date of Birth/Sex: June 13, 1960 (57 y.o. Male) Treating RN: Roger Shelter Primary Care Provider: [name] Other Clinician: Referring Provider: Nelson Chimes Treating Provider/Extender: [name], [name] Weeks in Treatment: 3 Subjective Chief  Complaint Information obtained from Patient He is here in follow up for lle venous ulcers History of Present Illness (HPI) 06/25/17-he is here in follow-up evaluation for left lower extremity, medial malleolus, venous ulcers. He is compliant with compression stocking where although his compression stocking does not fit appropriately in length. He has been advised to contact elastic therapy, who measured and provided his current stockings, regarding a more appropriate fitted stocking. He is voicing no complaints or concerns, tolerated debridement. There is improvement in appearance, no significant change in measurements. We will continue with Santyl ointment, will apply to all 3 wounds. He will follow-up next week 06/30/17-he is here earlier than his scheduled appointment on Friday, per his request, secondary to new open areas. After inquiry, it appears that the new superficial open areas are secondary to adhesive removal. There is no evidence of infection, minimal/scant drainage, most of the new areas are epithelialized. His chronic wounds are stable in appearance, will continue with Santyl. He wants to maintain Friday appointment and will follow-up next Friday, he was encouraged to contact clinic with any ulcer changes or concerns 07/10/17 on evaluation today patient appears to be doing better in regard to his lower extremity ulcers. Two of the four areas show signs of a lot of improvement there is very little slough noted at this point. In fact this was able to  be easily cleaned off with saline and gauze. There was more appearance slough noted however in the area of the proximal ulcers. He does have pain at these locations that's well. He has continued to use the Santyl. Patient History Information obtained from Patient. Family History Cancer - Father, Heart Disease - Father,Siblings, No family history of Diabetes, Hereditary Spherocytosis, Hypertension, Kidney Disease, Lung Disease, Seizures,  Stroke, Thyroid Problems, Tuberculosis. Social History Current some day smoker, Marital Status - Separated, Alcohol Use - Rarely, Drug Use - No History, Caffeine Use - Daily. Review of Systems (ROS) Constitutional Symptoms (General Health) Denies complaints or symptoms of Fever, Chills. Respiratory The patient has no complaints or symptoms. Cardiovascular Complains or has symptoms of LE edema. Psychiatric The patient has no complaints or symptoms. Stevick, Cavion C. ([id number]) Objective Constitutional Well-nourished and well-hydrated in no acute distress. Vitals Time Taken: 3:44 AM, Height: 75 in, Weight: 179 lbs, BMI: 22.4, Temperature: 98.4 F, Pulse: 77 bpm, Respiratory Rate: 18 breaths/min, Blood Pressure: 133/85 mmHg. Respiratory normal breathing without difficulty. Psychiatric this patient is able to make decisions and demonstrates good insight into disease process. Alert and Oriented x 3. pleasant and cooperative. General Notes: Patient to proximal ulcers showed evidence of Slough covering in this did require sharp debridement today which he tolerated without complication. Post debridement the wound appeared better although there was still slough noted that needs to be removed. That is why we're gonna continue with the Santyl to help with this. Integumentary (Hair, Skin) Wound #1 status is Open. Original cause of wound was Gradually Appeared. The wound is located on the Left,Proximal,Medial Lower Leg. The wound measures 0.8cm length x 0.8cm width x 0.1cm depth; 0.503cm^2 area and 0.05cm^3 volume. There is Fat Layer (Subcutaneous Tissue) Exposed exposed. There is no tunneling or undermining noted. There is a medium amount of serosanguineous drainage noted. The wound margin is flat and intact. There is small (1-33%) red granulation within the wound bed. There is a large (67-100%) amount of necrotic tissue within the wound bed including Adherent Slough. The periwound skin appearance  exhibited: Excoriation, Scarring, Erythema. The periwound skin appearance did not exhibit: Callus, Crepitus, Induration, Rash, Dry/Scaly, Maceration, Atrophie Blanche, Cyanosis, Ecchymosis, Hemosiderin Staining, Mottled, Pallor, Rubor. The surrounding wound skin color is noted with erythema which is circumferential. Periwound temperature was noted as No Abnormality. The periwound has tenderness on palpation. Wound #2 status is Open. Original cause of wound was Gradually Appeared. The wound is located on the Left,Midline Lower Leg. The wound measures 2.5cm length x 2.4cm width x 0.1cm depth; 4.712cm^2 area and 0.471cm^3 volume. There is Fat Layer (Subcutaneous Tissue) Exposed exposed. There is no tunneling or undermining noted. There is a large amount of serosanguineous drainage noted. The wound margin is flat and intact. There is small (1-33%) red granulation within the wound bed. There is a large (67-100%) amount of necrotic tissue within the wound bed including Adherent Slough. The periwound skin appearance exhibited: Excoriation, Erythema. The periwound skin appearance did not exhibit: Callus, Crepitus, Induration, Rash, Scarring, Dry/Scaly, Maceration, Atrophie Blanche, Cyanosis, Ecchymosis, Hemosiderin Staining, Mottled, Pallor, Rubor. The surrounding wound skin color is noted with erythema which is circumferential. Periwound temperature was noted as No Abnormality. The periwound has tenderness on palpation. Wound #3 status is Open. Original cause of wound was Gradually Appeared. The wound is located on the Left,Distal,Midline Lower Leg. The wound measures 0.2cm length x 0.2cm width x 0.1cm depth; 0.031cm^2 area and 0.003cm^3 volume. There is Fat Layer (Subcutaneous Tissue) Exposed  exposed. There is no tunneling or undermining noted. There is a small amount of serosanguineous drainage noted. The wound margin is flat and intact. There is small (1-33%) red granulation within the wound bed. There  is a large (67-100%) amount of necrotic tissue within the wound bed including Adherent Slough. The periwound skin appearance exhibited: Excoriation, Erythema. The periwound skin appearance did not exhibit: Callus, Crepitus, Induration, Rash, Scarring, Dry/Scaly, Maceration, Atrophie Blanche, Cyanosis, Ecchymosis, Hemosiderin Staining, Mottled, Pallor, Rubor. The surrounding wound skin color is noted with erythema which is circumferential. Periwound temperature was noted as No Abnormality. The periwound has tenderness on palpation. Wound #4 status is Healed - Epithelialized. Original cause of wound was Gradually Appeared. The wound is located on the [place], Guy C. ([id number]) Left,Medial Foot. The wound measures 0cm length x 0cm width x 0cm depth; 0cm^2 area and 0cm^3 volume. 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 There was a Skin/Subcutaneous Tissue Debridement ([id number]) debridement with total area of 0.64 sq cm performed by STONE III, [name] E., PA-C. with the following instrument(s): Curette to remove Viable and Non-Viable tissue/material including Fibrin/Slough, Skin, and Subcutaneous after achieving pain control using Other (lidocaine 4%). A time out was conducted at 04:00, prior to the start of the procedure. A Minimum amount of bleeding was controlled with Pressure. The procedure was tolerated well with a pain level of 1 throughout and a pain level of 0 following the procedure. Post Debridement Measurements: 0.8cm length x 0.8cm width x 0.1cm depth; 0.05cm^3 volume. Character of Wound/Ulcer Post Debridement is stable. Post procedure Diagnosis Wound #1: Same as Pre-Procedure Wound  #2 Pre-procedure diagnosis of Wound #2 is a Lymphedema located on the Left,Midline Lower Leg . There was a Skin/Subcutaneous Tissue Debridement ([id number]) debridement with total area of 6 sq cm performed by STONE III, [name] E., PA-C. with the following instrument(s): Curette to remove Viable and Non-Viable tissue/material including Fibrin/Slough, Skin, and Subcutaneous after achieving pain control using Other (lidocaine 4%). A time out was conducted at 04:00, prior to the start of the procedure. A Minimum amount of bleeding was controlled with Pressure. The procedure was tolerated well with a pain level of 1 throughout and a pain level of 0 following the procedure. Post Debridement Measurements: 2.5cm length x 2.4cm width x 0.2cm depth; 0.942cm^3 volume. Character of Wound/Ulcer Post Debridement is stable. Post procedure Diagnosis Wound #2: Same as Pre-Procedure Plan Wound Cleansing: Wound #1 Left,Proximal,Medial Lower Leg: Clean wound with Normal Saline. Wound #2 Left,Midline Lower Leg: Clean wound with Normal Saline. Wound #3 Left,Distal,Midline Lower Leg: Clean wound with Normal Saline. TAJH, LIVSEY ([id number]) Anesthetic (add to Medication List): Wound #1 Left,Proximal,Medial Lower Leg: Topical Lidocaine 4% cream applied to wound bed prior to debridement (In Clinic Only). Wound #2 Left,Midline Lower Leg: Topical Lidocaine 4% cream applied to wound bed prior to debridement (In Clinic Only). Wound #3 Left,Distal,Midline Lower Leg: Topical Lidocaine 4% cream applied to wound bed prior to debridement (In Clinic Only). Primary Wound Dressing: Wound #3 Left,Distal,Midline Lower Leg: Prisma Ag - moisten with saline Wound #1 Left,Proximal,Medial Lower Leg: Santyl Ointment - apply saline soaked gauze over santyl Wound #2 Left,Midline Lower Leg: Santyl Ointment - apply saline soaked gauze over santyl Secondary Dressing: Wound #1 Left,Proximal,Medial  Lower Leg: Non-adherent pad -  secured with tape Wound #2 Left,Midline Lower Leg: Non-adherent pad - secured with tape Wound #3 Left,Distal,Midline Lower Leg: Non-adherent pad - secured with tape Dressing Change Frequency: Wound #1 Left,Proximal,Medial Lower Leg: Change dressing every day. Wound #2 Left,Midline Lower Leg: Change dressing every day. Wound #3 Left,Distal,Midline Lower Leg: Change dressing every day. Follow-up Appointments: Wound #1 Left,Proximal,Medial Lower Leg: Return Appointment in 2 weeks. Wound #2 Left,Midline Lower Leg: Return Appointment in 2 weeks. Wound #3 Left,Distal,Midline Lower Leg: Return Appointment in 2 weeks. Edema Control: Patient to wear own compression stockings We will continue with the above wound care orders for the next week. Hopefully patient will continue to show signs of good improvement he has to fit this point. Please see above for specific wound care orders. We will see patient for re-evaluation in 1 week(s) here in the clinic. If anything worsens or changes patient will contact our office for additional recommendations. Electronic Signature(s) Signed: 07/13/2017 9:06:28 AM By: Worthy Keeler PA-C Entered By: Worthy Keeler on 07/10/2017 17:17:49 Beegle, Jaylen CMarland Kitchen ([id number]) -------------------------------------------------------------------------------- ROS/PFSH Details Patient Name: Wisby, Bohdi C. Date of Service: 07/10/2017 3:30 PM Medical Record Number: [id number] Patient Account Number: [id number] Date of Birth/Sex: 18-Mar-1961 (57 y.o. Male) Treating RN: Roger Shelter Primary Care Provider: [name] Other Clinician: Referring Provider: Nelson Chimes Treating Provider/Extender: [name], [name] Weeks in Treatment: 3 Information Obtained From Patient Wound History Do you currently have one or more open woundso Yes How many open wounds do you currently haveo 3 Approximately how long have you had your woundso several months How have you been  treating your wound(s) until nowo xeroform gauze Has your wound(s) ever healed and then re-openedo No Have you had any lab work done in the past montho No Have you tested positive for an antibiotic resistant organism (MRSA, VRE)o No Have you tested positive for osteomyelitis (bone infection)o No Have you had any tests for circulation on your legso No Constitutional Symptoms (General Health) Complaints and Symptoms: Negative for: Fever; Chills Cardiovascular Complaints and Symptoms: Positive for: LE edema Medical History: Negative for: Arrhythmia; Congestive Heart Failure; Coronary Artery Disease; Deep Vein Thrombosis; Hypertension; Hypotension; Myocardial Infarction; Peripheral Arterial Disease; Peripheral Venous Disease; Phlebitis; Vasculitis Eyes Medical History: Negative for: Cataracts; Glaucoma; Optic Neuritis Hematologic/Lymphatic Medical History: Negative for: Anemia; Hemophilia; Human Immunodeficiency Virus; Lymphedema; Sickle Cell Disease Respiratory Complaints and Symptoms: No Complaints or Symptoms Medical History: Positive for: Sleep Apnea Negative for: Aspiration; Asthma; Chronic Obstructive Pulmonary Disease (COPD); Pneumothorax; Tuberculosis Gastrointestinal Spidle, Ahmaud C. ([id number]) Medical History: Negative for: Cirrhosis ; Colitis; Crohnos; Hepatitis A; Hepatitis B; Hepatitis C Endocrine Medical History: Negative for: Type I Diabetes; Type II Diabetes Genitourinary Medical History: Negative for: End Stage Renal Disease Immunological Medical History: Negative for: Lupus Erythematosus; Raynaudos; Scleroderma Integumentary (Skin) Medical History: Negative for: History of Burn Musculoskeletal Medical History: Negative for: Gout; Rheumatoid Arthritis; Osteoarthritis; Osteomyelitis Neurologic Medical History: Negative for: Dementia; Neuropathy; Paraplegia; Seizure Disorder Psychiatric Complaints and Symptoms: No Complaints or Symptoms Medical  History: Positive for: Confinement Anxiety Negative for: Anorexia/bulimia Immunizations Pneumococcal Vaccine: Received Pneumococcal Vaccination: No Implantable Devices Family and Social History Cancer: Yes - Father; Diabetes: No; Heart Disease: Yes - Father,Siblings; Hereditary Spherocytosis: No; Hypertension: No; Kidney Disease: No; Lung Disease: No; Seizures: No; Stroke: No; Thyroid Problems: No; Tuberculosis: No; Current some day smoker; Marital Status - Separated; Alcohol Use: Rarely; Drug Use: No History; Caffeine Use: Daily; Financial Concerns: No; Food, Clothing or Shelter Needs: No; Support  System Lacking: No; Transportation Concerns: No; Advanced Directives: No; Patient does not want information on Advanced Directives; Do not resuscitate: No; Living Will: No; Medical Power of Attorney: No Physician Affirmation I have reviewed and agree with the above information. JASTEN, GUYETTE ([id number]) Electronic Signature(s) Signed: 07/13/2017 9:06:28 AM By: Worthy Keeler PA-C Signed: 07/14/2017 4:28:38 PM By: Roger Shelter Entered By: Worthy Keeler on 07/10/2017 17:17:07 Vestal, Erie C. ([id number]) -------------------------------------------------------------------------------- SuperBill Details Patient Name: Villa, Enrrique C. Date of Service: 07/10/2017 Medical Record Number: [id number] Patient Account Number: [id number] Date of Birth/Sex: 1961-01-02 (57 y.o. Male) Treating RN: Roger Shelter Primary Care Provider: [name] Other Clinician: Referring Provider: Nelson Chimes Treating Provider/Extender: [name], [name] Weeks in Treatment: 3 Diagnosis Coding ICD-10 Codes Code Description I89.0 Lymphedema, not elsewhere classified L97.822 Non-pressure chronic ulcer of other part of left lower leg with fat layer exposed E66.09 Other obesity due to excess calories Facility Procedures CPT4 Code Description: [id number] - DEB SUBQ TISSUE 20 SQ CM/< ICD-10 Diagnosis  Description L97.822 Non-pressure chronic ulcer of other part of left lower leg with Modifier: fat layer expos Quantity: 1 ed Physician Procedures CPT4 Code Description: [id number] 70623 - WC PHYS SUBQ TISS 20 SQ CM ICD-10 Diagnosis Description L97.822 Non-pressure chronic ulcer of other part of left lower leg with Modifier: fat layer expos Quantity: 1 ed Electronic Signature(s) Signed: 07/13/2017 9:06:28 AM By: Worthy Keeler PA-C Entered By: Worthy Keeler on 07/10/2017 17:18:00

## 2017-07-17 ENCOUNTER — Encounter: Payer: 59 | Admitting: Physician Assistant

## 2017-07-17 ENCOUNTER — Other Ambulatory Visit: Payer: Self-pay | Admitting: Internal Medicine

## 2017-07-17 DIAGNOSIS — L97822 Non-pressure chronic ulcer of other part of left lower leg with fat layer exposed: Secondary | ICD-10-CM | POA: Diagnosis not present

## 2017-07-17 NOTE — Progress Notes (Signed)
Reviewed urology notes  01/26/17 PSA 1.0 rec Sildenafil 20 mg qd ED return Prn Dr. Gregor Hams  [place]  CT pelvis 03/29/03 moderately large left inguinal hernia w/o obstruction.

## 2017-07-20 NOTE — Progress Notes (Signed)
EMANUEL, DOWSON ([id number]) Visit Report for 07/17/2017 Chief Complaint Document Details Patient Name: [name], [name] Date of Service: 07/17/2017 3:30 PM Medical Record Number: [id number] Patient Account Number: [id number] Date of Birth/Sex: [birth year] (57 y.o. Male) Treating RN: Montey Hora Primary Care Provider: [name] Other Clinician: Referring Provider: Nelson Chimes Treating Provider/Extender: [name], Levora Werden Weeks in Treatment: 4 Information Obtained from: Patient Chief Complaint He is here in follow up for lle venous ulcers Electronic Signature(s) Signed: 07/20/2017 8:04:39 AM By: Worthy Keeler PA-C Entered By: Worthy Keeler on 07/17/2017 15:46:56 [name], [name] ([id number]) -------------------------------------------------------------------------------- Debridement Details Patient Name: [name], [name] Date of Service: 07/17/2017 3:30 PM Medical Record Number: [id number] Patient Account Number: [id number] Date of Birth/Sex: [birth year] (57 y.o. Male) Treating RN: Montey Hora Primary Care Provider: [name] Other Clinician: Referring Provider: Nelson Chimes Treating Provider/Extender: [name], Nihal Doan Weeks in Treatment: 4 Debridement Performed for Wound #1 Left,Proximal,Medial Lower Leg Assessment: Performed By: Physician STONE III, Jamieka Royle E., PA-C Debridement: Debridement Pre-procedure Verification/Time Yes - 16:01 Out Taken: Start Time: 16:01 Pain Control: Lidocaine 4% Topical Solution Level: Skin/Subcutaneous Tissue Total Area Debrided (L x W): 0.8 (cm) x 1.1 (cm) = 0.88 (cm) Tissue and other material Viable, Non-Viable, Fibrin/Slough, Subcutaneous debrided: Instrument: Curette Bleeding: Minimum Hemostasis Achieved: Pressure End Time: 16:03 Procedural Pain: 0 Post Procedural Pain: 0 Response to Treatment: Procedure was tolerated well Post Debridement Measurements of Total Wound Length: (cm) 0.8 Width: (cm) 1.1 Depth: (cm)  0.2 Volume: (cm) 0.138 Character of Wound/Ulcer Post Debridement: Improved Post Procedure Diagnosis Same as Pre-procedure Electronic Signature(s) Signed: 07/17/2017 4:58:58 PM By: Montey Hora Signed: 07/20/2017 8:04:39 AM By: Worthy Keeler PA-C Entered By: Montey Hora on 07/17/2017 16:06:18 [name], [name] ([id number]) -------------------------------------------------------------------------------- Debridement Details Patient Name: [name], [name] Date of Service: 07/17/2017 3:30 PM Medical Record Number: [id number] Patient Account Number: [id number] Date of Birth/Sex: 13-May-1961 (57 y.o. Male) Treating RN: Montey Hora Primary Care Provider: [name] Other Clinician: Referring Provider: Nelson Chimes Treating Provider/Extender: [name], Grafton Warzecha Weeks in Treatment: 4 Debridement Performed for Wound #2 Left,Midline Lower Leg Assessment: Performed By: Physician STONE III, Mayrin Schmuck E., PA-C Debridement: Debridement Pre-procedure Verification/Time Yes - 16:03 Out Taken: Start Time: 16:03 Pain Control: Lidocaine 4% Topical Solution Level: Skin/Subcutaneous Tissue Total Area Debrided (L x W): 2.5 (cm) x 2.4 (cm) = 6 (cm) Tissue and other material Viable, Non-Viable, Fibrin/Slough, Subcutaneous debrided: Instrument: Curette Bleeding: Minimum Hemostasis Achieved: Pressure End Time: 16:05 Procedural Pain: 0 Post Procedural Pain: 0 Response to Treatment: Procedure was tolerated well Post Debridement Measurements of Total Wound Length: (cm) 2.5 Width: (cm) 2.4 Depth: (cm) 0.2 Volume: (cm) 0.942 Character of Wound/Ulcer Post Debridement: Improved Post Procedure Diagnosis Same as Pre-procedure Electronic Signature(s) Signed: 07/17/2017 4:58:58 PM By: Montey Hora Signed: 07/20/2017 8:04:39 AM By: Worthy Keeler PA-C Entered By: Montey Hora on 07/17/2017 16:06:47 [name], [name]  ([id number]) -------------------------------------------------------------------------------- HPI Details Patient Name: [name], [name] Date of Service: 07/17/2017 3:30 PM Medical Record Number: [id number] Patient Account Number: [id number] Date of Birth/Sex: [birth year] (57 y.o. Male) Treating RN: Montey Hora Primary Care Provider: [name] Other Clinician: Referring Provider: Nelson Chimes Treating Provider/Extender: [name], Ronalee Scheunemann Weeks in Treatment: 4 History of Present Illness HPI Description: 06/25/17-he is here in follow-up evaluation for left lower extremity, medial malleolus, venous ulcers. He is compliant with compression stocking where although his compression stocking does not fit appropriately in length. He has been advised to contact elastic therapy, who measured and provided his  current stockings, regarding a more appropriate fitted stocking. He is voicing no complaints or concerns, tolerated debridement. There is improvement in appearance, no significant change in measurements. We will continue with Santyl ointment, will apply to all 3 wounds. He will follow-up next week 06/30/17-he is here earlier than his scheduled appointment on Friday, per his request, secondary to new open areas. After inquiry, it appears that the new superficial open areas are secondary to adhesive removal. There is no evidence of infection, minimal/scant drainage, most of the new areas are epithelialized. His chronic wounds are stable in appearance, will continue with Santyl. He wants to maintain Friday appointment and will follow-up next Friday, he was encouraged to contact clinic with any ulcer changes or concerns 07/10/17 on evaluation today patient appears to be doing better in regard to his lower extremity ulcers. Two of the four areas show signs of a lot of improvement there is very little slough noted at this point. In fact this was able to be easily cleaned off with saline and gauze.  There was more appearance slough noted however in the area of the proximal ulcers. He does have pain at these locations that's well. He has continued to use the Santyl. 07/17/17 on evaluation today patient appears to be doing fairly well in regard to his left lower extremity ulcers. He has been tolerating the Santyl without complication I do believe adding the saline to exalt and covering the Santyl has made a huge improvement in his symptoms overall. Fortunately patient has no evidence of infection and he does feel like it's looking better as well. Electronic Signature(s) Signed: 07/20/2017 8:04:39 AM By: Worthy Keeler PA-C Entered By: Worthy Keeler on 07/17/2017 16:24:17 Flinchbaugh, [name] ([id number]) -------------------------------------------------------------------------------- Physical Exam Details Patient Name: [name], [name] Date of Service: 07/17/2017 3:30 PM Medical Record Number: [id number] Patient Account Number: [id number] Date of Birth/Sex: 04/02/1961 (57 y.o. Male) Treating RN: Montey Hora Primary Care Provider: [name] Other Clinician: Referring Provider: Nelson Chimes Treating Provider/Extender: STONE III, Guerino Caporale Weeks in Treatment: 4 Constitutional Well-nourished and well-hydrated in no acute distress. Respiratory normal breathing without difficulty. Psychiatric this patient is able to make decisions and demonstrates good insight into disease process. Alert and Oriented x 3. pleasant and cooperative. Notes Patient's wound that a remaining, two of them did require sharp debridement today and patient tolerated this well without complication although he did have some discomfort we were able to clean off the surface of the wounds in an excellent fashion. Electronic Signature(s) Signed: 07/20/2017 8:04:39 AM By: Worthy Keeler PA-C Entered By: Worthy Keeler on 07/17/2017 16:24:52 Moster, [name] Loletha Grayer  ([id number]) -------------------------------------------------------------------------------- Physician Orders Details Patient Name: [name], [name] Date of Service: 07/17/2017 3:30 PM Medical Record Number: [id number] Patient Account Number: [id number] Date of Birth/Sex: [birth year] (57 y.o. Male) Treating RN: Montey Hora Primary Care Provider: [name] Other Clinician: Referring Provider: Nelson Chimes Treating Provider/Extender: [name], Kouper Spinella Weeks in Treatment: 4 Verbal / Phone Orders: No Diagnosis Coding ICD-10 Coding Code Description I89.0 Lymphedema, not elsewhere classified L97.822 Non-pressure chronic ulcer of other part of left lower leg with fat layer exposed E66.09 Other obesity due to excess calories Wound Cleansing Wound #1 Left,Proximal,Medial Lower Leg o Clean wound with Normal Saline. Wound #2 Left,Midline Lower Leg o Clean wound with Normal Saline. Anesthetic (add to Medication List) Wound #1 Left,Proximal,Medial Lower Leg o Topical Lidocaine 4% cream applied to wound bed prior to debridement (In Clinic Only). Wound #2 Left,Midline Lower Leg o  Topical Lidocaine 4% cream applied to wound bed prior to debridement (In Clinic Only). Primary Wound Dressing Wound #1 Left,Proximal,Medial Lower Leg o Santyl Ointment - apply saline soaked gauze over santyl Wound #2 Left,Midline Lower Leg o Santyl Ointment - apply saline soaked gauze over santyl Secondary Dressing Wound #1 Left,Proximal,Medial Lower Leg o Non-adherent pad - secured with tape Wound #2 Left,Midline Lower Leg o Non-adherent pad - secured with tape Dressing Change Frequency Wound #1 Left,Proximal,Medial Lower Leg o Change dressing every day. Wound #2 Left,Midline Lower Leg o Change dressing every day. Follow-up Appointments LAMONTAE, RICARDO ([id number]) Wound #1 Left,Proximal,Medial Lower Leg o Return Appointment in 2 weeks. Wound #2 Left,Midline Lower Leg o  Return Appointment in 2 weeks. Edema Control o Patient to wear own compression stockings Electronic Signature(s) Signed: 07/17/2017 4:58:58 PM By: Montey Hora Signed: 07/20/2017 8:04:39 AM By: Worthy Keeler PA-C Entered By: Montey Hora on 07/17/2017 16:07:04 Fouche, Alfio C. ([id number]) -------------------------------------------------------------------------------- Problem List Details Patient Name: [name], [name] Date of Service: 07/17/2017 3:30 PM Medical Record Number: [id number] Patient Account Number: [id number] Date of Birth/Sex: 1961-01-16 (57 y.o. Male) Treating RN: Montey Hora Primary Care Provider: [name] Other Clinician: Referring Provider: Nelson Chimes Treating Provider/Extender: [name], Clarnce Homan Weeks in Treatment: 4 Active Problems ICD-10 Encounter Code Description Active Date Diagnosis I89.0 Lymphedema, not elsewhere classified 06/22/2017 Yes L97.822 Non-pressure chronic ulcer of other part of left lower leg with fat 06/22/2017 Yes layer exposed E66.09 Other obesity due to excess calories 06/22/2017 Yes Inactive Problems Resolved Problems Electronic Signature(s) Signed: 07/20/2017 8:04:39 AM By: Worthy Keeler PA-C Entered By: Worthy Keeler on 07/17/2017 15:46:49 Hodges, Gotham C. ([id number]) -------------------------------------------------------------------------------- Progress Note Details Patient Name: [name], [name] Date of Service: 07/17/2017 3:30 PM Medical Record Number: [id number] Patient Account Number: [id number] Date of Birth/Sex: 03-19-1961 (57 y.o. Male) Treating RN: Montey Hora Primary Care Provider: [name] Other Clinician: Referring Provider: Nelson Chimes Treating Provider/Extender: [name], Brion Hedges Weeks in Treatment: 4 Subjective Chief Complaint Information obtained from Patient He is here in follow up for lle venous ulcers History of Present Illness (HPI) 06/25/17-he is here in follow-up evaluation  for left lower extremity, medial malleolus, venous ulcers. He is compliant with compression stocking where although his compression stocking does not fit appropriately in length. He has been advised to contact elastic therapy, who measured and provided his current stockings, regarding a more appropriate fitted stocking. He is voicing no complaints or concerns, tolerated debridement. There is improvement in appearance, no significant change in measurements. We will continue with Santyl ointment, will apply to all 3 wounds. He will follow-up next week 06/30/17-he is here earlier than his scheduled appointment on Friday, per his request, secondary to new open areas. After inquiry, it appears that the new superficial open areas are secondary to adhesive removal. There is no evidence of infection, minimal/scant drainage, most of the new areas are epithelialized. His chronic wounds are stable in appearance, will continue with Santyl. He wants to maintain Friday appointment and will follow-up next Friday, he was encouraged to contact clinic with any ulcer changes or concerns 07/10/17 on evaluation today patient appears to be doing better in regard to his lower extremity ulcers. Two of the four areas show signs of a lot of improvement there is very little slough noted at this point. In fact this was able to be easily cleaned off with saline and gauze. There was more appearance slough noted however in the area of the proximal ulcers. He  does have pain at these locations that's well. He has continued to use the Santyl. 07/17/17 on evaluation today patient appears to be doing fairly well in regard to his left lower extremity ulcers. He has been tolerating the Santyl without complication I do believe adding the saline to exalt and covering the Santyl has made a huge improvement in his symptoms overall. Fortunately patient has no evidence of infection and he does feel like it's looking better as well. Patient  History Information obtained from Patient. Family History Cancer - Father, Heart Disease - Father,Siblings, No family history of Diabetes, Hereditary Spherocytosis, Hypertension, Kidney Disease, Lung Disease, Seizures, Stroke, Thyroid Problems, Tuberculosis. Social History Current some day smoker, Marital Status - Separated, Alcohol Use - Rarely, Drug Use - No History, Caffeine Use - Daily. Review of Systems (ROS) Constitutional Symptoms (General Health) Denies complaints or symptoms of Fever, Chills. Respiratory The patient has no complaints or symptoms. Cardiovascular Complains or has symptoms of LE edema. Psychiatric Kalman, Alejos C. ([id number]) The patient has no complaints or symptoms. Objective Constitutional Well-nourished and well-hydrated in no acute distress. Vitals Time Taken: 3:38 PM, Height: 75 in, Weight: 179 lbs, BMI: 22.4, Temperature: 98.6 F, Pulse: 78 bpm, Respiratory Rate: 18 breaths/min, Blood Pressure: 136/85 mmHg. Respiratory normal breathing without difficulty. Psychiatric this patient is able to make decisions and demonstrates good insight into disease process. Alert and Oriented x 3. pleasant and cooperative. General Notes: Patient's wound that a remaining, two of them did require sharp debridement today and patient tolerated this well without complication although he did have some discomfort we were able to clean off the surface of the wounds in an excellent fashion. Integumentary (Hair, Skin) Wound #1 status is Open. Original cause of wound was Gradually Appeared. The wound is located on the Left,Proximal,Medial Lower Leg. The wound measures 0.8cm length x 1.1cm width x 0.1cm depth; 0.691cm^2 area and 0.069cm^3 volume. There is Fat Layer (Subcutaneous Tissue) Exposed exposed. There is no tunneling or undermining noted. There is a medium amount of serosanguineous drainage noted. The wound margin is flat and intact. There is small (1-33%) red granulation  within the wound bed. There is a large (67-100%) amount of necrotic tissue within the wound bed including Adherent Slough. The periwound skin appearance exhibited: Excoriation, Scarring, Erythema. The periwound skin appearance did not exhibit: Callus, Crepitus, Induration, Rash, Dry/Scaly, Maceration, Atrophie Blanche, Cyanosis, Ecchymosis, Hemosiderin Staining, Mottled, Pallor, Rubor. The surrounding wound skin color is noted with erythema which is circumferential. Periwound temperature was noted as No Abnormality. The periwound has tenderness on palpation. Wound #2 status is Open. Original cause of wound was Gradually Appeared. The wound is located on the Left,Midline Lower Leg. The wound measures 2.5cm length x 2.4cm width x 0.1cm depth; 4.712cm^2 area and 0.471cm^3 volume. There is Fat Layer (Subcutaneous Tissue) Exposed exposed. There is no tunneling or undermining noted. There is a large amount of serosanguineous drainage noted. The wound margin is flat and intact. There is small (1-33%) red granulation within the wound bed. There is a large (67-100%) amount of necrotic tissue within the wound bed including Adherent Slough. The periwound skin appearance exhibited: Excoriation, Erythema. The periwound skin appearance did not exhibit: Callus, Crepitus, Induration, Rash, Scarring, Dry/Scaly, Maceration, Atrophie Blanche, Cyanosis, Ecchymosis, Hemosiderin Staining, Mottled, Pallor, Rubor. The surrounding wound skin color is noted with erythema which is circumferential. Periwound temperature was noted as No Abnormality. The periwound has tenderness on palpation. Wound #3 status is Healed - Epithelialized. Original cause of wound was  Gradually Appeared. The wound is located on the Left,Distal,Midline Lower Leg. The wound measures 0cm length x 0cm width x 0cm depth; 0cm^2 area and 0cm^3 volume. There is Fat Layer (Subcutaneous Tissue) Exposed exposed. There is no tunneling or undermining noted. There  is a small amount of serosanguineous drainage noted. The wound margin is flat and intact. There is small (1-33%) red granulation within the wound bed. There is a large (67-100%) amount of necrotic tissue within the wound bed including Adherent Slough. The periwound skin appearance exhibited: Excoriation, Erythema. The periwound skin appearance did not exhibit: Callus, Crepitus, Induration, Rash, Scarring, Dry/Scaly, Maceration, Atrophie Blanche, Cyanosis, Ecchymosis, Hemosiderin Staining, Lingo, Dellas C. ([id number]) Mottled, Pallor, Rubor. The surrounding wound skin color is noted with erythema which is circumferential. Periwound temperature was noted as No Abnormality. The periwound has tenderness on palpation. 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 There was a Skin/Subcutaneous Tissue Debridement ([id number]) debridement with total area of 0.88 sq cm performed by STONE III, Marjory Meints E., PA-C. with the following instrument(s): Curette to remove Viable and Non-Viable tissue/material including Fibrin/Slough and Subcutaneous after achieving pain control using Lidocaine 4% Topical Solution. A time out was conducted at 16:01, prior to the start of the procedure. A Minimum amount of bleeding was controlled with Pressure. The procedure was tolerated well with a pain level of 0 throughout and a pain level of 0 following the procedure. Post Debridement Measurements: 0.8cm length x 1.1cm width x 0.2cm depth; 0.138cm^3 volume. Character of Wound/Ulcer Post Debridement is improved. Post procedure Diagnosis Wound #1: Same as Pre-Procedure Wound #2 Pre-procedure diagnosis of Wound #2 is a Lymphedema located on the  Left,Midline Lower Leg . There was a Skin/Subcutaneous Tissue Debridement ([id number]) debridement with total area of 6 sq cm performed by STONE III, Brylei Pedley E., PA-C. with the following instrument(s): Curette to remove Viable and Non-Viable tissue/material including Fibrin/Slough and Subcutaneous after achieving pain control using Lidocaine 4% Topical Solution. A time out was conducted at 16:03, prior to the start of the procedure. A Minimum amount of bleeding was controlled with Pressure. The procedure was tolerated well with a pain level of 0 throughout and a pain level of 0 following the procedure. Post Debridement Measurements: 2.5cm length x 2.4cm width x 0.2cm depth; 0.942cm^3 volume. Character of Wound/Ulcer Post Debridement is improved. Post procedure Diagnosis Wound #2: Same as Pre-Procedure Plan Wound Cleansing: Wound #1 Left,Proximal,Medial Lower Leg: Clean wound with Normal Saline. Wound #2 Left,Midline Lower Leg: Clean wound with Normal Saline. Anesthetic (add to Medication List): AN, SCHNABEL. ([id number]) Wound #1 Left,Proximal,Medial Lower Leg: Topical Lidocaine 4% cream applied to wound bed prior to debridement (In Clinic Only). Wound #2 Left,Midline Lower Leg: Topical Lidocaine 4% cream applied to wound bed prior to debridement (In Clinic Only). Primary Wound Dressing: Wound #1 Left,Proximal,Medial Lower Leg: Santyl Ointment - apply saline soaked gauze over santyl Wound #2 Left,Midline Lower Leg: Santyl Ointment - apply saline soaked gauze over santyl Secondary Dressing: Wound #1 Left,Proximal,Medial Lower Leg: Non-adherent pad - secured with tape Wound #2 Left,Midline Lower Leg: Non-adherent pad - secured with tape Dressing Change Frequency: Wound #1 Left,Proximal,Medial Lower Leg: Change dressing every day. Wound #2 Left,Midline Lower Leg: Change dressing every day. Follow-up Appointments: Wound #1  Left,Proximal,Medial Lower Leg: Return Appointment in 2  weeks. Wound #2 Left,Midline Lower Leg: Return Appointment in 2 weeks. Edema Control: Patient to wear own compression stockings I am going to recommend that we continue with the Current wound care measures for the next week as I do feel like it has been of benefit for him and hopefully will continue to be. Patient is in agreement with the plan. We will see were things stand at that point. Please see above for specific wound care orders. We will see patient for re-evaluation in 1 week(s) here in the clinic. If anything worsens or changes patient will contact our office for additional recommendations. Electronic Signature(s) Signed: 07/20/2017 8:04:39 AM By: Worthy Keeler PA-C Entered By: Worthy Keeler on 07/17/2017 16:25:17 Deblasi, Zalman CMarland Kitchen ([id number]) -------------------------------------------------------------------------------- ROS/PFSH Details Patient Name: Welter, Meldon C. Date of Service: 07/17/2017 3:30 PM Medical Record Number: [id number] Patient Account Number: [id number] Date of Birth/Sex: 05-15-1961 (57 y.o. Male) Treating RN: Montey Hora Primary Care Provider: [name] Other Clinician: Referring Provider: Nelson Chimes Treating Provider/Extender: [name], Zenaya Ulatowski Weeks in Treatment: 4 Information Obtained From Patient Wound History Do you currently have one or more open woundso Yes How many open wounds do you currently haveo 3 Approximately how long have you had your woundso several months How have you been treating your wound(s) until nowo xeroform gauze Has your wound(s) ever healed and then re-openedo No Have you had any lab work done in the past montho No Have you tested positive for an antibiotic resistant organism (MRSA, VRE)o No Have you tested positive for osteomyelitis (bone infection)o No Have you had any tests for circulation on your legso No Constitutional Symptoms (General Health) Complaints and Symptoms: Negative for: Fever;  Chills Cardiovascular Complaints and Symptoms: Positive for: LE edema Medical History: Negative for: Arrhythmia; Congestive Heart Failure; Coronary Artery Disease; Deep Vein Thrombosis; Hypertension; Hypotension; Myocardial Infarction; Peripheral Arterial Disease; Peripheral Venous Disease; Phlebitis; Vasculitis Eyes Medical History: Negative for: Cataracts; Glaucoma; Optic Neuritis Hematologic/Lymphatic Medical History: Negative for: Anemia; Hemophilia; Human Immunodeficiency Virus; Lymphedema; Sickle Cell Disease Respiratory Complaints and Symptoms: No Complaints or Symptoms Medical History: Positive for: Sleep Apnea Negative for: Aspiration; Asthma; Chronic Obstructive Pulmonary Disease (COPD); Pneumothorax; Tuberculosis Gastrointestinal Foronda, Senon C. ([id number]) Medical History: Negative for: Cirrhosis ; Colitis; Crohnos; Hepatitis A; Hepatitis B; Hepatitis C Endocrine Medical History: Negative for: Type I Diabetes; Type II Diabetes Genitourinary Medical History: Negative for: End Stage Renal Disease Immunological Medical History: Negative for: Lupus Erythematosus; Raynaudos; Scleroderma Integumentary (Skin) Medical History: Negative for: History of Burn Musculoskeletal Medical History: Negative for: Gout; Rheumatoid Arthritis; Osteoarthritis; Osteomyelitis Neurologic Medical History: Negative for: Dementia; Neuropathy; Paraplegia; Seizure Disorder Psychiatric Complaints and Symptoms: No Complaints or Symptoms Medical History: Positive for: Confinement Anxiety Negative for: Anorexia/bulimia Immunizations Pneumococcal Vaccine: Received Pneumococcal Vaccination: No Implantable Devices Family and Social History Cancer: Yes - Father; Diabetes: No; Heart Disease: Yes - Father,Siblings; Hereditary Spherocytosis: No; Hypertension: No; Kidney Disease: No; Lung Disease: No; Seizures: No; Stroke: No; Thyroid Problems: No; Tuberculosis: No; Current some day smoker;  Marital Status - Separated; Alcohol Use: Rarely; Drug Use: No History; Caffeine Use: Daily; Financial Concerns: No; Food, Clothing or Shelter Needs: No; Support System Lacking: No; Transportation Concerns: No; Advanced Directives: No; Patient does not want information on Advanced Directives; Do not resuscitate: No; Living Will: No; Medical Power of Attorney: No Physician Affirmation I have reviewed and agree with the above information. JJ, ENYEART ([id number]) Electronic Signature(s) Signed: 07/17/2017 4:58:58 PM By:  Montey Hora Signed: 07/20/2017 8:04:39 AM By: Worthy Keeler PA-C Entered By: Worthy Keeler on 07/17/2017 16:24:38 Kijowski, Dougles Loletha Grayer ([id number]) -------------------------------------------------------------------------------- SuperBill Details Patient Name: Flaugher, Cabell C. Date of Service: 07/17/2017 Medical Record Number: [id number] Patient Account Number: [id number] Date of Birth/Sex: 1961-01-17 (57 y.o. Male) Treating RN: Montey Hora Primary Care Provider: [name] Other Clinician: Referring Provider: Nelson Chimes Treating Provider/Extender: [name], Tamaj Jurgens Weeks in Treatment: 4 Diagnosis Coding ICD-10 Codes Code Description I89.0 Lymphedema, not elsewhere classified L97.822 Non-pressure chronic ulcer of other part of left lower leg with fat layer exposed E66.09 Other obesity due to excess calories Facility Procedures CPT4 Code Description: 46950722 11042 - DEB SUBQ TISSUE 20 SQ CM/< ICD-10 Diagnosis Description L97.822 Non-pressure chronic ulcer of other part of left lower leg with Modifier: fat layer expos Quantity: 1 ed Physician Procedures CPT4 Code Description: 5750518 33582 - WC PHYS SUBQ TISS 20 SQ CM ICD-10 Diagnosis Description L97.822 Non-pressure chronic ulcer of other part of left lower leg with Modifier: fat layer expos Quantity: 1 ed Electronic Signature(s) Signed: 07/20/2017 8:04:39 AM By: Worthy Keeler PA-C Entered By: Worthy Keeler on 07/17/2017 16:25:25

## 2017-07-21 NOTE — Progress Notes (Signed)
RONTAE, INGLETT ([id number]) Visit Report for 07/17/2017 Arrival Information Details Patient Name: [name], [name] Date of Service: 07/17/2017 3:30 PM Medical Record Number: [id number] Patient Account Number: [id number] Date of Birth/Sex: 1960/12/18 (57 y.o. Male) Treating RN: Montey Hora Primary Care Nathanyl Andujo: [name] Other Clinician: Referring Khalifa Knecht: Nelson Chimes Treating Weslie Pretlow/Extender: [name], [name] Weeks in Treatment: 4 Visit Information History Since Last Visit Added or deleted any medications: No Patient Arrived: Ambulatory Any new allergies or adverse reactions: No Arrival Time: 15:38 Had a fall or experienced change in No Accompanied By: self activities of daily living that may affect Transfer Assistance: None risk of falls: Patient Identification Verified: Yes Signs or symptoms of abuse/neglect since last visito No Secondary Verification Process Completed: Yes Hospitalized since last visit: No Patient Requires Transmission-Based No Has Dressing in Place as Prescribed: Yes Precautions: Has Compression in Place as Prescribed: Yes Patient Has Alerts: No Pain Present Now: No Electronic Signature(s) Signed: 07/17/2017 4:58:58 PM By: Montey Hora Entered By: Montey Hora on 07/17/2017 15:38:19 [name], [name] ([id number]) -------------------------------------------------------------------------------- Encounter Discharge Information Details Patient Name: [name], [name] Date of Service: 07/17/2017 3:30 PM Medical Record Number: [id number] Patient Account Number: [id number] Date of Birth/Sex: 13-Mar-1961 (57 y.o. Male) Treating RN: Montey Hora Primary Care Chauntelle Azpeitia: [name] Other Clinician: Referring Roxi Hlavaty: Nelson Chimes Treating Gryffin Altice/Extender: [name], [name] Weeks in Treatment: 4 Encounter Discharge Information Items Schedule Follow-up Appointment: No Medication Reconciliation completed and No provided to Patient/Care  Korissa Horsford: Provided on Clinical Summary of Care: 07/17/2017 Form Type Recipient Paper Patient LF Electronic Signature(s) Signed: 07/20/2017 4:58:43 PM By: Ruthine Dose Entered By: Ruthine Dose on 07/17/2017 16:14:45 [name], [name] ([id number]) -------------------------------------------------------------------------------- Lower Extremity Assessment Details Patient Name: [name], [name] Date of Service: 07/17/2017 3:30 PM Medical Record Number: [id number] Patient Account Number: [id number] Date of Birth/Sex: 1960/12/08 (57 y.o. Male) Treating RN: Montey Hora Primary Care Donnell Beauchamp: [name] Other Clinician: Referring Eddis Pingleton: Nelson Chimes Treating Ryah Cribb/Extender: [name], [name] Weeks in Treatment: 4 Edema Assessment Assessed: [Left: No] [Right: No] [Left: Edema] [Right: :] Calf Left: Right: Point of Measurement: 37 cm From Medial Instep 45.2 cm cm Ankle Left: Right: Point of Measurement: 12 cm From Medial Instep 28.4 cm cm Vascular Assessment Pulses: Dorsalis Pedis Palpable: [Left:Yes] Posterior Tibial Extremity colors, hair growth, and conditions: Extremity Color: [Left:Hyperpigmented] Hair Growth on Extremity: [Left:No] Temperature of Extremity: [Left:Warm] Capillary Refill: [Left:< 3 seconds] Toe Nail Assessment Left: Right: Thick: Yes Discolored: Yes Deformed: Yes Improper Length and Hygiene: Yes Electronic Signature(s) Signed: 07/17/2017 4:58:58 PM By: Montey Hora Entered By: Montey Hora on 07/17/2017 16:03:41 [name], [name] ([id number]) -------------------------------------------------------------------------------- Multi Wound Chart Details Patient Name: [name], [name] Date of Service: 07/17/2017 3:30 PM Medical Record Number: [id number] Patient Account Number: [id number] Date of Birth/Sex: May 29, 1961 (57 y.o. Male) Treating RN: Montey Hora Primary Care Trinka Keshishyan: [name] Other Clinician: Referring Chen Saadeh: Nelson Chimes Treating Marieelena Bartko/Extender: STONE III, [name] Weeks in Treatment: 4 Vital Signs Height(in): 75 Pulse(bpm): 78 Weight(lbs): 179 Blood Pressure(mmHg): 136/85 Body Mass Index(BMI): 22 Temperature(F): 98.6 Respiratory Rate 18 (breaths/min): Photos: [1:No Photos] [2:No Photos] [3:No Photos] Wound Location: [1:Left Lower Leg - Medial, Proximal] [2:Left Lower Leg - Midline] [3:Left, Distal, Midline Lower Leg] Wounding Event: [1:Gradually Appeared] [2:Gradually Appeared] [3:Gradually Appeared] Primary Etiology: [1:Lymphedema] [2:Lymphedema] [3:Lymphedema] Comorbid History: [1:Sleep Apnea, Confinement Anxiety] [2:Sleep Apnea, Confinement Anxiety] [3:Sleep Apnea, Confinement Anxiety] Date Acquired: [1:01/17/2017] [2:01/17/2017] [3:01/17/2017] Weeks of Treatment: [1:4] [2:4] [3:4] Wound Status: [1:Open] [2:Open] [3:Healed - Epithelialized] Measurements L x W x  D [1:0.8x1.1x0.1] [2:2.5x2.4x0.1] [3:0x0x0] (cm) Area (cm) : [1:0.691] [2:4.712] [3:0] Volume (cm) : [1:0.069] [2:0.471] [3:0] % Reduction in Area: [1:0.00%] [2:7.70%] [3:100.00%] % Reduction in Volume: [1:0.00%] [2:7.80%] [3:100.00%] Classification: [1:Full Thickness Without Exposed Support Structures] [2:Full Thickness Without Exposed Support Structures] [3:Partial Thickness] Exudate Amount: [1:Medium] [2:Large] [3:Small] Exudate Type: [1:Serosanguineous] [2:Serosanguineous] [3:Serosanguineous] Exudate Color: [1:red, brown] [2:red, brown] [3:red, brown] Wound Margin: [1:Flat and Intact] [2:Flat and Intact] [3:Flat and Intact] Granulation Amount: [1:Small (1-33%)] [2:Small (1-33%)] [3:Small (1-33%)] Granulation Quality: [1:Red] [2:Red] [3:Red] Necrotic Amount: [1:Large (67-100%)] [2:Large (67-100%)] [3:Large (67-100%)] Exposed Structures: [1:Fat Layer (Subcutaneous Tissue) Exposed: Yes Fascia: No Tendon: No Muscle: No Joint: No Bone: No] [2:Fat Layer (Subcutaneous Tissue) Exposed: Yes Fascia: No Tendon: No Muscle: No Joint:  No Bone: No] [3:Fat Layer (Subcutaneous Tissue) Exposed: Yes  Fascia: No Tendon: No Muscle: No Joint: No Bone: No] Epithelialization: [1:None] [2:None] [3:None] Periwound Skin Texture: [1:Excoriation: Yes Scarring: Yes Induration: No Callus: No] [2:Excoriation: Yes Induration: No Callus: No Crepitus: No] [3:Excoriation: Yes Induration: No Callus: No Crepitus: No] Crepitus: No Rash: No Rash: No Rash: No Scarring: No Scarring: No Periwound Skin Moisture: Maceration: No Maceration: No Maceration: No Dry/Scaly: No Dry/Scaly: No Dry/Scaly: No Periwound Skin Color: Erythema: Yes Erythema: Yes Erythema: Yes Atrophie Blanche: No Atrophie Blanche: No Atrophie Blanche: No Cyanosis: No Cyanosis: No Cyanosis: No Ecchymosis: No Ecchymosis: No Ecchymosis: No Hemosiderin Staining: No Hemosiderin Staining: No Hemosiderin Staining: No Mottled: No Mottled: No Mottled: No Pallor: No Pallor: No Pallor: No Rubor: No Rubor: No Rubor: No Erythema Location: Circumferential Circumferential Circumferential Temperature: No Abnormality No Abnormality No Abnormality Tenderness on Palpation: Yes Yes Yes Wound Preparation: Ulcer Cleansing: Ulcer Cleansing: Ulcer Cleansing: Rinsed/Irrigated with Saline Rinsed/Irrigated with Saline Rinsed/Irrigated with Saline Topical Anesthetic Applied: Topical Anesthetic Applied: Topical Anesthetic Applied: Other: lidocaine 4% Other: lisdocaine 4% Other: lidocaine 4% Treatment Notes Electronic Signature(s) Signed: 07/17/2017 4:58:58 PM By: Montey Hora Entered By: Montey Hora on 07/17/2017 16:04:09 [name], [name] ([id number]) -------------------------------------------------------------------------------- [place] Details Patient Name: Hollis, Kalee C. Date of Service: 07/17/2017 3:30 PM Medical Record Number: [id number] Patient Account Number: [id number] Date of Birth/Sex: December 02, 1960 (57 y.o. Male) Treating RN: Montey Hora Primary Care Jael Kostick: [name] Other Clinician: Referring Jarion Hawthorne: Nelson Chimes Treating Anush Wiedeman/Extender: [name], [name] Weeks in Treatment: 4 Active Inactive ` Orientation to the Wound Care Program Nursing Diagnoses: Knowledge deficit related to the wound healing center program Goals: Patient/caregiver will verbalize understanding of the [facility] Date Initiated: 06/19/2017 Target Resolution Date: 07/10/2017 Goal Status: Active Interventions: Provide education on orientation to the wound center Notes: ` Wound/Skin Impairment Nursing Diagnoses: Impaired tissue integrity Goals: Patient/caregiver will verbalize understanding of skin care regimen Date Initiated: 06/19/2017 Target Resolution Date: 07/10/2017 Goal Status: Active Ulcer/skin breakdown will have a volume reduction of 30% by week 4 Date Initiated: 06/19/2017 Target Resolution Date: 07/10/2017 Goal Status: Active Interventions: Assess patient/caregiver ability to obtain necessary supplies Assess patient/caregiver ability to perform ulcer/skin care regimen upon admission and as needed Assess ulceration(s) every visit Treatment Activities: Skin care regimen initiated : 06/19/2017 Notes: Electronic Signature(s) Signed: 07/17/2017 4:58:58 PM By: Georgena Spurling, Mechele Claude ([id number]) Entered By: Montey Hora on 07/17/2017 16:03:44 Baack, Eilan C. ([id number]) -------------------------------------------------------------------------------- Pain Assessment Details Patient Name: Bachtell, Edman C. Date of Service: 07/17/2017 3:30 PM Medical Record Number: [id number] Patient Account Number: [id number] Date of Birth/Sex: 03/23/1961 (57 y.o. Male) Treating RN: Montey Hora Primary Care Sabah Zucco: [name] Other Clinician: Referring Khiry Pasquariello: Nelson Chimes Treating Donne Baley/Extender: Joaquim Lai  III, [name] Weeks in Treatment: 4 Active Problems Location of Pain Severity and  Description of Pain Patient Has Paino Yes Site Locations Pain Location: Pain in Ulcers With Dressing Change: Yes Duration of the Pain. Constant / Intermittento Intermittent Character of Pain Describe the Pain: Other: irritated Pain Management and Medication Current Pain Management: Notes Topical or injectable lidocaine is offered to patient for acute pain when surgical debridement is performed. If needed, Patient is instructed to use over the counter pain medication for the following 24-48 hours after debridement. Wound care MDs do not prescribed pain medications. Patient has chronic pain or uncontrolled pain. Patient has been instructed to make an appointment with their Primary Care Physician for pain management. Electronic Signature(s) Signed: 07/17/2017 4:58:58 PM By: Montey Hora Entered By: Montey Hora on 07/17/2017 15:38:42 Stuller, [name] Kitchen ([id number]) -------------------------------------------------------------------------------- Wound Assessment Details Patient Name: [name], [name] Date of Service: 07/17/2017 3:30 PM Medical Record Number: [id number] Patient Account Number: [id number] Date of Birth/Sex: 1961-01-28 (57 y.o. Male) Treating RN: Montey Hora Primary Care Tiffini Blacksher: [name] Other Clinician: Referring Okla Qazi: Nelson Chimes Treating Kolston Lacount/Extender: STONE III, [name] Weeks in Treatment: 4 Wound Status Wound Number: 1 Primary Etiology: Lymphedema Wound Location: Left Lower Leg - Medial, Proximal Wound Status: Open Wounding Event: Gradually Appeared Comorbid History: Sleep Apnea, Confinement Anxiety Date Acquired: 01/17/2017 Weeks Of Treatment: 4 Clustered Wound: No Photos Photo Uploaded By: Montey Hora on 07/17/2017 16:27:30 Wound Measurements Length: (cm) 0.8 Width: (cm) 1.1 Depth: (cm) 0.1 Area: (cm) 0.691 Volume: (cm) 0.069 % Reduction in Area: 0% % Reduction in Volume: 0% Epithelialization: None Tunneling:  No Undermining: No Wound Description Full Thickness Without Exposed Support Classification: Structures Wound Margin: Flat and Intact Exudate Medium Amount: Exudate Type: Serosanguineous Exudate Color: red, brown Foul Odor After Cleansing: No Slough/Fibrino Yes Wound Bed Granulation Amount: Small (1-33%) Exposed Structure Granulation Quality: Red Fascia Exposed: No Necrotic Amount: Large (67-100%) Fat Layer (Subcutaneous Tissue) Exposed: Yes Necrotic Quality: Adherent Slough Tendon Exposed: No Muscle Exposed: No Joint Exposed: No Bone Exposed: No [name], [name] ([id number]) Periwound Skin Texture Texture Color No Abnormalities Noted: No No Abnormalities Noted: No Callus: No Atrophie Blanche: No Crepitus: No Cyanosis: No Excoriation: Yes Ecchymosis: No Induration: No Erythema: Yes Rash: No Erythema Location: Circumferential Scarring: Yes Hemosiderin Staining: No Mottled: No Moisture Pallor: No No Abnormalities Noted: No Rubor: No Dry / Scaly: No Maceration: No Temperature / Pain Temperature: No Abnormality Tenderness on Palpation: Yes Wound Preparation Ulcer Cleansing: Rinsed/Irrigated with Saline Topical Anesthetic Applied: Other: lidocaine 4%, Electronic Signature(s) Signed: 07/17/2017 4:58:58 PM By: Montey Hora Entered By: Montey Hora on 07/17/2017 16:01:49 [name], [name] ([id number]) -------------------------------------------------------------------------------- Wound Assessment Details Patient Name: Blayney, Calvyn C. Date of Service: 07/17/2017 3:30 PM Medical Record Number: [id number] Patient Account Number: [id number] Date of Birth/Sex: [birth year] (57 y.o. Male) Treating RN: Montey Hora Primary Care Nayelli Inglis: [name] Other Clinician: Referring Shanell Aden: Nelson Chimes Treating Antwann Preziosi/Extender: STONE III, [name] Weeks in Treatment: 4 Wound Status Wound Number: 2 Primary Etiology: Lymphedema Wound Location: Left Lower Leg -  Midline Wound Status: Open Wounding Event: Gradually Appeared Comorbid History: Sleep Apnea, Confinement Anxiety Date Acquired: 01/17/2017 Weeks Of Treatment: 4 Clustered Wound: No Photos Photo Uploaded By: Montey Hora on 07/17/2017 16:27:49 Wound Measurements Length: (cm) 2.5 Width: (cm) 2.4 Depth: (cm) 0.1 Area: (cm) 4.712 Volume: (cm) 0.471 % Reduction in Area: 7.7% % Reduction in Volume: 7.8% Epithelialization: None Tunneling: No Undermining: No Wound Description Full Thickness Without Exposed Support Classification: Structures Wound Margin: Flat  and Intact Exudate Large Amount: Exudate Type: Serosanguineous Exudate Color: red, brown Foul Odor After Cleansing: No Slough/Fibrino Yes Wound Bed Granulation Amount: Small (1-33%) Exposed Structure Granulation Quality: Red Fascia Exposed: No Necrotic Amount: Large (67-100%) Fat Layer (Subcutaneous Tissue) Exposed: Yes Necrotic Quality: Adherent Slough Tendon Exposed: No Muscle Exposed: No Joint Exposed: No Bone Exposed: No [name], [name] ([id number]) Periwound Skin Texture Texture Color No Abnormalities Noted: No No Abnormalities Noted: No Callus: No Atrophie Blanche: No Crepitus: No Cyanosis: No Excoriation: Yes Ecchymosis: No Induration: No Erythema: Yes Rash: No Erythema Location: Circumferential Scarring: No Hemosiderin Staining: No Mottled: No Moisture Pallor: No No Abnormalities Noted: No Rubor: No Dry / Scaly: No Maceration: No Temperature / Pain Temperature: No Abnormality Tenderness on Palpation: Yes Wound Preparation Ulcer Cleansing: Rinsed/Irrigated with Saline Topical Anesthetic Applied: Other: lisdocaine 4%, Electronic Signature(s) Signed: 07/17/2017 4:58:58 PM By: Montey Hora Entered By: Montey Hora on 07/17/2017 16:01:57 [name], [name] ([id number]) -------------------------------------------------------------------------------- Wound Assessment Details Patient Name:  [name], [name] Date of Service: 07/17/2017 3:30 PM Medical Record Number: [id number] Patient Account Number: [id number] Date of Birth/Sex: 1960/07/05 (57 y.o. Male) Treating RN: Montey Hora Primary Care Belina Mandile: [name] Other Clinician: Referring Brahim Dolman: Nelson Chimes Treating Niralya Ohanian/Extender: STONE III, [name] Weeks in Treatment: 4 Wound Status Wound Number: 3 Primary Etiology: Lymphedema Wound Location: Left, Distal, Midline Lower Leg Wound Status: Healed - Epithelialized Wounding Event: Gradually Appeared Comorbid History: Sleep Apnea, Confinement Anxiety Date Acquired: 01/17/2017 Weeks Of Treatment: 4 Clustered Wound: No Photos Photo Uploaded By: Montey Hora on 07/17/2017 16:27:49 Wound Measurements Length: (cm) 0 % Redu Width: (cm) 0 % Redu Depth: (cm) 0 Epithe Area: (cm) 0 Tunne Volume: (cm) 0 Under ction in Area: 100% ction in Volume: 100% lialization: None ling: No mining: No Wound Description Classification: Partial Thickness Wound Margin: Flat and Intact Exudate Amount: Small Exudate Type: Serosanguineous Exudate Color: red, brown Foul Odor After Cleansing: No Slough/Fibrino Yes Wound Bed Granulation Amount: Small (1-33%) Exposed Structure Granulation Quality: Red Fascia Exposed: No Necrotic Amount: Large (67-100%) Fat Layer (Subcutaneous Tissue) Exposed: Yes Necrotic Quality: Adherent Slough Tendon Exposed: No Muscle Exposed: No Joint Exposed: No Bone Exposed: No Periwound Skin Texture Schweigert, Ervan C. ([id number]) Texture Color No Abnormalities Noted: No No Abnormalities Noted: No Callus: No Atrophie Blanche: No Crepitus: No Cyanosis: No Excoriation: Yes Ecchymosis: No Induration: No Erythema: Yes Rash: No Erythema Location: Circumferential Scarring: No Hemosiderin Staining: No Mottled: No Moisture Pallor: No No Abnormalities Noted: No Rubor: No Dry / Scaly: No Maceration: No Temperature / Pain Temperature: No  Abnormality Tenderness on Palpation: Yes Wound Preparation Ulcer Cleansing: Rinsed/Irrigated with Saline Topical Anesthetic Applied: Other: lidocaine 4%, Electronic Signature(s) Signed: 07/17/2017 4:58:58 PM By: Montey Hora Entered By: Montey Hora on 07/17/2017 16:03:09 Wold, Maxamilian C. ([id number]) -------------------------------------------------------------------------------- Vitals Details Patient Name: Mcchesney, Dixon C. Date of Service: 07/17/2017 3:30 PM Medical Record Number: [id number] Patient Account Number: [id number] Date of Birth/Sex: Feb 18, 1961 (57 y.o. Male) Treating RN: Montey Hora Primary Care Ysabella Babiarz: [name] Other Clinician: Referring Abe Schools: Nelson Chimes Treating Faylinn Schwenn/Extender: STONE III, [name] Weeks in Treatment: 4 Vital Signs Time Taken: 15:38 Temperature (F): 98.6 Height (in): 75 Pulse (bpm): 78 Weight (lbs): 179 Respiratory Rate (breaths/min): 18 Body Mass Index (BMI): 22.4 Blood Pressure (mmHg): 136/85 Reference Range: 80 - 120 mg / dl Electronic Signature(s) Signed: 07/17/2017 4:58:58 PM By: Montey Hora Entered By: Montey Hora on 07/17/2017 15:40:24

## 2017-07-24 ENCOUNTER — Encounter: Payer: 59 | Admitting: Physician Assistant

## 2017-07-24 DIAGNOSIS — L97822 Non-pressure chronic ulcer of other part of left lower leg with fat layer exposed: Secondary | ICD-10-CM | POA: Diagnosis not present

## 2017-07-28 NOTE — Progress Notes (Signed)
LAKEN, LOBATO ([id number]) Visit Report for 07/24/2017 Arrival Information Details Patient Name: [name], [name] Date of Service: 07/24/2017 3:00 PM Medical Record Number: [id number] Patient Account Number: [id number] Date of Birth/Sex: February 24, 1961 (57 y.o. Male) Treating RN: Roger Shelter Primary Care Alondra Vandeven: [name] Other Clinician: Referring Giavanna Kang: Nelson Chimes Treating Janette Harvie/Extender: [name], [name] Weeks in Treatment: 5 Visit Information History Since Last Visit Added or deleted any medications: No Patient Arrived: Ambulatory Any new allergies or adverse reactions: No Arrival Time: 15:07 Had a fall or experienced change in No Accompanied By: self activities of daily living that may affect Transfer Assistance: None risk of falls: Patient Identification Verified: Yes Signs or symptoms of abuse/neglect since last visito No Secondary Verification Process Completed: Yes Hospitalized since last visit: No Patient Requires Transmission-Based No Has Dressing in Place as Prescribed: Yes Precautions: Has Compression in Place as Prescribed: Yes Patient Has Alerts: No Pain Present Now: No Electronic Signature(s) Signed: 07/24/2017 4:58:29 PM By: Roger Shelter Entered By: Roger Shelter on 07/24/2017 15:11:22 [name], [name] ([id number]) -------------------------------------------------------------------------------- Encounter Discharge Information Details Patient Name: Lindamood, Philemon C. Date of Service: 07/24/2017 3:00 PM Medical Record Number: [id number] Patient Account Number: [id number] Date of Birth/Sex: 11-21-1960 (57 y.o. Male) Treating RN: Montey Hora Primary Care Kaeya Schiffer: [name] Other Clinician: Referring Vonnie Ligman: Nelson Chimes Treating Nakiesha Rumsey/Extender: [name], [name] Weeks in Treatment: 5 Encounter Discharge Information Items Discharge Pain Level: 0 Discharge Condition: Stable Ambulatory Status: Ambulatory Discharge  Destination: Home Transportation: Private Auto Accompanied By: self Schedule Follow-up Appointment: Yes Medication Reconciliation completed and No provided to Patient/Care Keyairra Kolinski: Patient Clinical Summary of Care: Declined Electronic Signature(s) Signed: 07/28/2017 8:07:32 AM By: Ruthine Dose Entered By: Ruthine Dose on 07/24/2017 15:51:54 [name], [name] ([id number]) -------------------------------------------------------------------------------- Lower Extremity Assessment Details Patient Name: Kassis, Orren C. Date of Service: 07/24/2017 3:00 PM Medical Record Number: [id number] Patient Account Number: [id number] Date of Birth/Sex: 1960-12-03 (57 y.o. Male) Treating RN: Roger Shelter Primary Care Yosmar Ryker: [name] Other Clinician: Referring Lavonn Maxcy: Nelson Chimes Treating Raylene Carmickle/Extender: STONE III, [name] Weeks in Treatment: 5 Edema Assessment Assessed: [Left: No] [Right: No] [Left: Edema] [Right: :] Calf Left: Right: Point of Measurement: 37 cm From Medial Instep 45.3 cm cm Ankle Left: Right: Point of Measurement: 12 cm From Medial Instep 29.8 cm cm Vascular Assessment Pulses: Dorsalis Pedis Palpable: [Left:Yes] Posterior Tibial Palpable: [Left:Yes] Popliteal Palpable: [Left:Yes] Extremity colors, hair growth, and conditions: Extremity Color: [Left:Normal] Hair Growth on Extremity: [Left:No] Temperature of Extremity: [Left:Warm] Capillary Refill: [Left:< 3 seconds] Toe Nail Assessment Left: Right: Thick: Yes Discolored: Yes Deformed: No Improper Length and Hygiene: Yes Electronic Signature(s) Signed: 07/24/2017 4:58:29 PM By: Roger Shelter Entered By: Roger Shelter on 07/24/2017 15:15:03 [name], [name] ([id number]) -------------------------------------------------------------------------------- Multi Wound Chart Details Patient Name: [name], [name] Date of Service: 07/24/2017 3:00 PM Medical Record Number: [id number] Patient Account  Number: [id number] Date of Birth/Sex: April 21, 1961 (56 y.o. Male) Treating RN: Montey Hora Primary Care Biran Mayberry: [name] Other Clinician: Referring Ayo Guarino: Nelson Chimes Treating Dwain Huhn/Extender: STONE III, [name] Weeks in Treatment: 5 Vital Signs Height(in): 75 Pulse(bpm): 90 Weight(lbs): 179 Blood Pressure(mmHg): 146/75 Body Mass Index(BMI): 22 Temperature(F): 98.2 Respiratory Rate 18 (breaths/min): Photos: [1:No Photos] [2:No Photos] [N/A:N/A] Wound Location: [1:Left, Proximal, Medial Lower Leg] [2:Left, Midline Lower Leg] [N/A:N/A] Wounding Event: [1:Gradually Appeared] [2:Gradually Appeared] [N/A:N/A] Primary Etiology: [1:Lymphedema] [2:Lymphedema] [N/A:N/A] Date Acquired: [1:01/17/2017] [2:01/17/2017] [N/A:N/A] Weeks of Treatment: [1:5] [2:5] [N/A:N/A] Wound Status: [1:Open] [2:Open] [N/A:N/A] Measurements L x W x D [1:0.7x1.2x0.1] [2:2.5x23x0.1] [N/A:N/A] (cm) Area (cm) : [  1:0.66] [2:45.16] [N/A:N/A] Volume (cm) : [1:0.066] [2:4.516] [N/A:N/A] % Reduction in Area: [1:4.50%] [2:-784.60%] [N/A:N/A] % Reduction in Volume: [1:4.30%] [2:-783.80%] [N/A:N/A] Classification: [1:Full Thickness Without Exposed Support Structures] [2:Full Thickness Without Exposed Support Structures] [N/A:N/A] Periwound Skin Texture: [1:No Abnormalities Noted] [2:No Abnormalities Noted] [N/A:N/A] Periwound Skin Moisture: [1:No Abnormalities Noted] [2:No Abnormalities Noted] [N/A:N/A] Periwound Skin Color: [1:No Abnormalities Noted No] [2:No Abnormalities Noted No] [N/A:N/A N/A] Treatment Notes Electronic Signature(s) Signed: 07/24/2017 5:21:02 PM By: Montey Hora Entered By: Montey Hora on 07/24/2017 15:37:55 [name], [name] ([id number]) -------------------------------------------------------------------------------- [place] Details Patient Name: Flom, Micah C. Date of Service: 07/24/2017 3:00 PM Medical Record Number: [id number] Patient Account  Number: [id number] Date of Birth/Sex: June 01, 1961 (57 y.o. Male) Treating RN: Montey Hora Primary Care Nijah Orlich: [name] Other Clinician: Referring Kelsi Benham: Nelson Chimes Treating Wiletta Bermingham/Extender: [name], [name] Weeks in Treatment: 5 Active Inactive ` Orientation to the Wound Care Program Nursing Diagnoses: Knowledge deficit related to the wound healing center program Goals: Patient/caregiver will verbalize understanding of the [facility] Date Initiated: 06/19/2017 Target Resolution Date: 07/10/2017 Goal Status: Active Interventions: Provide education on orientation to the wound center Notes: ` Wound/Skin Impairment Nursing Diagnoses: Impaired tissue integrity Goals: Patient/caregiver will verbalize understanding of skin care regimen Date Initiated: 06/19/2017 Target Resolution Date: 07/10/2017 Goal Status: Active Ulcer/skin breakdown will have a volume reduction of 30% by week 4 Date Initiated: 06/19/2017 Target Resolution Date: 07/10/2017 Goal Status: Active Interventions: Assess patient/caregiver ability to obtain necessary supplies Assess patient/caregiver ability to perform ulcer/skin care regimen upon admission and as needed Assess ulceration(s) every visit Treatment Activities: Skin care regimen initiated : 06/19/2017 Notes: Electronic Signature(s) Signed: 07/24/2017 5:21:02 PM By: Georgena Spurling, Mechele Claude ([id number]) Entered By: Montey Hora on 07/24/2017 15:36:55 [name], [name] ([id number]) -------------------------------------------------------------------------------- Pain Assessment Details Patient Name: Schelling, Kalani C. Date of Service: 07/24/2017 3:00 PM Medical Record Number: [id number] Patient Account Number: [id number] Date of Birth/Sex: 01/02/1961 (57 y.o. Male) Treating RN: Roger Shelter Primary Care Danaiya Steadman: [name] Other Clinician: Referring Latandra Loureiro: Nelson Chimes Treating Blanchie Zeleznik/Extender:  STONE III, [name] Weeks in Treatment: 5 Active Problems Location of Pain Severity and Description of Pain Patient Has Paino No Site Locations Pain Management and Medication Current Pain Management: Electronic Signature(s) Signed: 07/24/2017 4:58:29 PM By: Roger Shelter Entered By: Roger Shelter on 07/24/2017 15:11:33 Murchison, Brenden Loletha [name] ([id number]) -------------------------------------------------------------------------------- Patient/Caregiver Education Details Patient Name: Prime, Abdulahi C. Date of Service: 07/24/2017 3:00 PM Medical Record Number: [id number] Patient Account Number: [id number] Date of Birth/Gender: [birth year] (57 y.o. Male) Treating RN: Montey Hora Primary Care Physician: [name] Other Clinician: Referring Physician: Nelson Chimes Treating Physician/Extender: Sharalyn Ink in Treatment: 5 Education Assessment Education Provided To: Patient Education Topics Provided Wound/Skin Impairment: Handouts: Other: wound care as ordered Methods: Explain/Verbal Responses: State content correctly Electronic Signature(s) Signed: 07/24/2017 5:21:02 PM By: Montey Hora Entered By: Montey Hora on 07/24/2017 15:46:31 [name], [name] ([id number]) -------------------------------------------------------------------------------- Wound Assessment Details Patient Name: [name], [name] Date of Service: 07/24/2017 3:00 PM Medical Record Number: [id number] Patient Account Number: [id number] Date of Birth/Sex: [birth year] (57 y.o. Male) Treating RN: Roger Shelter Primary Care Javyn Havlin: [name] Other Clinician: Referring Serayah Yazdani: Nelson Chimes Treating Ryane Canavan/Extender: STONE III, [name] Weeks in Treatment: 5 Wound Status Wound Number: 1 Primary Etiology: Lymphedema Wound Location: Left, Proximal, Medial Lower Leg Wound Status: Open Wounding Event: Gradually Appeared Date Acquired: 01/17/2017 Weeks Of Treatment: 5 Clustered Wound:  No Photos Photo Uploaded By: Roger Shelter on 07/24/2017 16:36:09 Wound Measurements Length: (  cm) 0.7 Width: (cm) 1.2 Depth: (cm) 0.1 Area: (cm) 0.66 Volume: (cm) 0.066 % Reduction in Area: 4.5% % Reduction in Volume: 4.3% Wound Description Full Thickness Without Exposed Support Classification: Structures Periwound Skin Texture Texture Color No Abnormalities Noted: No No Abnormalities Noted: No Moisture No Abnormalities Noted: No Treatment Notes Wound #1 (Left, Proximal, Medial Lower Leg) 1. Cleansed with: Clean wound with Normal Saline 2. Anesthetic Topical Lidocaine 4% cream to wound bed prior to debridement Hurricaine Topical Anesthetic Spray [name], [name] ([id number]) 4. Dressing Applied: Santyl Ointment 5. Secondary Dressing Applied Non-Adherent pad 7. Secured with Tape Notes apply saline gauze over santyl Electronic Signature(s) Signed: 07/24/2017 4:58:29 PM By: Roger Shelter Entered By: Roger Shelter on 07/24/2017 15:12:25 [name], [name] ([id number]) -------------------------------------------------------------------------------- Wound Assessment Details Patient Name: [name], [name] Date of Service: 07/24/2017 3:00 PM Medical Record Number: [id number] Patient Account Number: [id number] Date of Birth/Sex: Dec 14, 1960 (57 y.o. Male) Treating RN: Montey Hora Primary Care Alecxis Baltzell: [name] Other Clinician: Referring Riyah Bardon: Nelson Chimes Treating Aldea Avis/Extender: STONE III, [name] Weeks in Treatment: 5 Wound Status Wound Number: 2 Primary Etiology: Lymphedema Wound Location: Left, Midline Lower Leg Wound Status: Open Wounding Event: Gradually Appeared Date Acquired: 01/17/2017 Weeks Of Treatment: 5 Clustered Wound: No Photos Photo Uploaded By: Roger Shelter on 07/24/2017 16:36:09 Wound Measurements Length: (cm) 2.5 Width: (cm) 2.3 Depth: (cm) 0.1 Area: (cm) 4.516 Volume: (cm) 0.452 % Reduction in Area: 11.5% %  Reduction in Volume: 11.5% Wound Description Full Thickness Without Exposed Support Classification: Structures Periwound Skin Texture Texture Color No Abnormalities Noted: No No Abnormalities Noted: No Moisture No Abnormalities Noted: No Treatment Notes Wound #2 (Left, Midline Lower Leg) 1. Cleansed with: Clean wound with Normal Saline 2. Anesthetic Topical Lidocaine 4% cream to wound bed prior to debridement Hurricaine Topical Anesthetic Spray Fritzler, Ziyad C. ([id number]) 4. Dressing Applied: Santyl Ointment 5. Secondary Dressing Applied Non-Adherent pad 7. Secured with Tape Notes apply saline gauze over santyl Electronic Signature(s) Signed: 07/24/2017 5:21:02 PM By: Montey Hora Entered By: Montey Hora on 07/24/2017 15:39:24 Spangle, Keeghan C. ([id number]) -------------------------------------------------------------------------------- Vitals Details Patient Name: Jagodzinski, Gage C. Date of Service: 07/24/2017 3:00 PM Medical Record Number: [id number] Patient Account Number: [id number] Date of Birth/Sex: [birth year] (57 y.o. Male) Treating RN: Roger Shelter Primary Care Kinberly Perris: [name] Other Clinician: Referring Armstrong Creasy: Nelson Chimes Treating Hala Narula/Extender: STONE III, [name] Weeks in Treatment: 5 Vital Signs Time Taken: 15:10 Temperature (F): 98.2 Height (in): 75 Pulse (bpm): 90 Weight (lbs): 179 Respiratory Rate (breaths/min): 18 Body Mass Index (BMI): 22.4 Blood Pressure (mmHg): 146/75 Reference Range: 80 - 120 mg / dl Electronic Signature(s) Signed: 07/24/2017 4:58:29 PM By: Roger Shelter Entered By: Roger Shelter on 07/24/2017 15:12:01

## 2017-07-28 NOTE — Progress Notes (Addendum)
OTTIS, VACHA ([id number]) Visit Report for 07/24/2017 Chief Complaint Document Details Patient Name: [name], [name] Date of Service: 07/24/2017 3:00 PM Medical Record Number: [id number] Patient Account Number: [id number] Date of Birth/Sex: [birth year] (57 y.o. Male) Treating RN: Montey Hora Primary Care Provider: [name] Other Clinician: Referring Provider: [name] Treating Provider/Extender: [name], Esvin Hnat Weeks in Treatment: 5 Information Obtained from: Patient Chief Complaint He is here in follow up for lle venous ulcers Electronic Signature(s) Signed: 07/25/2017 12:37:14 PM By: Worthy Keeler PA-C Entered By: Worthy Keeler on 07/24/2017 15:04:48 Khim, Ferrel C. ([id number]) -------------------------------------------------------------------------------- Debridement Details Patient Name: [name], [name] Date of Service: 07/24/2017 3:00 PM Medical Record Number: [id number] Patient Account Number: [id number] Date of Birth/Sex: 12/19/1960 (57 y.o. Male) Treating RN: Montey Hora Primary Care Provider: [name] Other Clinician: Referring Provider: [name] Treating Provider/Extender: [name], Kanisha Duba Weeks in Treatment: 5 Debridement Performed for Wound #1 Left,Proximal,Medial Lower Leg Assessment: Performed By: Physician STONE III, Leasa Kincannon E., PA-C Debridement: Debridement Pre-procedure Verification/Time Yes - 15:37 Out Taken: Start Time: 15:37 Pain Control: Lidocaine 4% Topical Solution Level: Skin/Subcutaneous Tissue Total Area Debrided (L x W): 0.7 (cm) x 1.2 (cm) = 0.84 (cm) Tissue and other material Viable, Non-Viable, Fibrin/Slough, Subcutaneous debrided: Instrument: Curette Bleeding: Minimum Hemostasis Achieved: Pressure End Time: 15:39 Procedural Pain: 0 Post Procedural Pain: 0 Response to Treatment: Procedure was tolerated well Post Debridement Measurements of Total Wound Length: (cm) 0.7 Width: (cm)  1.2 Depth: (cm) 0.2 Volume: (cm) 0.132 Character of Wound/Ulcer Post Debridement: Improved Post Procedure Diagnosis Same as Pre-procedure Electronic Signature(s) Signed: 07/24/2017 5:21:02 PM By: Montey Hora Signed: 07/25/2017 12:37:14 PM By: Worthy Keeler PA-C Entered By: Montey Hora on 07/24/2017 15:38:52 Settle, Dolores C. ([id number]) -------------------------------------------------------------------------------- Debridement Details Patient Name: [name], [name] Date of Service: 07/24/2017 3:00 PM Medical Record Number: [id number] Patient Account Number: [id number] Date of Birth/Sex: 01-08-1961 (57 y.o. Male) Treating RN: Montey Hora Primary Care Provider: [name] Other Clinician: Referring Provider: [name] Treating Provider/Extender: [name], Chelbie Jarnagin Weeks in Treatment: 5 Debridement Performed for Wound #2 Left,Midline Lower Leg Assessment: Performed By: Physician STONE III, Ketrina Boateng E., PA-C Debridement: Debridement Pre-procedure Verification/Time Yes - 15:39 Out Taken: Start Time: 15:39 Pain Control: Lidocaine 4% Topical Solution Level: Skin/Subcutaneous Tissue Total Area Debrided (L x W): 2.5 (cm) x 2.3 (cm) = 5.75 (cm) Tissue and other material Viable, Non-Viable, Fibrin/Slough, Subcutaneous debrided: Instrument: Curette Bleeding: Minimum Hemostasis Achieved: Pressure End Time: 15:41 Procedural Pain: 0 Post Procedural Pain: 0 Response to Treatment: Procedure was tolerated well Post Debridement Measurements of Total Wound Length: (cm) 2.5 Width: (cm) 2.3 Depth: (cm) 0.2 Volume: (cm) 0.903 Character of Wound/Ulcer Post Debridement: Improved Post Procedure Diagnosis Same as Pre-procedure Electronic Signature(s) Signed: 07/24/2017 5:21:02 PM By: Montey Hora Signed: 07/25/2017 12:37:14 PM By: Worthy Keeler PA-C Entered By: Montey Hora on 07/24/2017 15:40:33 Bugge, Townsend C.  ([id number]) -------------------------------------------------------------------------------- HPI Details Patient Name: [name], [name] Date of Service: 07/24/2017 3:00 PM Medical Record Number: [id number] Patient Account Number: [id number] Date of Birth/Sex: [birth year] (57 y.o. Male) Treating RN: Montey Hora Primary Care Provider: [name] Other Clinician: Referring Provider: [name] Treating Provider/Extender: [name], Dmarcus Decicco Weeks in Treatment: 5 History of Present Illness HPI Description: 06/25/17-he is here in follow-up evaluation for left lower extremity, medial malleolus, venous ulcers. He is compliant with compression stocking where although his compression stocking does not fit appropriately in length. He has been advised to contact elastic therapy, who measured and provided his  current stockings, regarding a more appropriate fitted stocking. He is voicing no complaints or concerns, tolerated debridement. There is improvement in appearance, no significant change in measurements. We will continue with Santyl ointment, will apply to all 3 wounds. He will follow-up next week 06/30/17-he is here earlier than his scheduled appointment on Friday, per his request, secondary to new open areas. After inquiry, it appears that the new superficial open areas are secondary to adhesive removal. There is no evidence of infection, minimal/scant drainage, most of the new areas are epithelialized. His chronic wounds are stable in appearance, will continue with Santyl. He wants to maintain Friday appointment and will follow-up next Friday, he was encouraged to contact clinic with any ulcer changes or concerns 07/24/17 On evaluation today patient appears to be doing well in regard to left lower extremity ulcer. He has been tolerating the dressing changes without complication. He still continues to use the [place] which does seem to be helpful for him this is excellent news.  Definitely no evidence of infection today which is good news. Overall I'm pleased with how things are going. Patient likewise is also pleased. Electronic Signature(s) Signed: 07/25/2017 12:37:14 PM By: Worthy Keeler PA-C Entered By: Worthy Keeler on 07/24/2017 21:28:10 Samara, [name] ([id number]) -------------------------------------------------------------------------------- Physical Exam Details Patient Name: Dellarocco, Reise C. Date of Service: 07/24/2017 3:00 PM Medical Record Number: [id number] Patient Account Number: [id number] Date of Birth/Sex: [birth year] (57 y.o. Male) Treating RN: Montey Hora Primary Care Provider: [name] Other Clinician: Referring Provider: [name] Treating Provider/Extender: STONE III, Jahking Lesser Weeks in Treatment: 5 Constitutional Well-nourished and well-hydrated in no acute distress. Respiratory normal breathing without difficulty. Psychiatric this patient is able to make decisions and demonstrates good insight into disease process. Alert and Oriented x 3. pleasant and cooperative. Notes Patient's wound on evaluation today does show some Slough covering at this point this did require sharp debridement which was performed today without complication post debridement patient's ulcer did appear to be much better with excellent granulation. Electronic Signature(s) Signed: 07/25/2017 12:37:14 PM By: Worthy Keeler PA-C Entered By: Worthy Keeler on 07/24/2017 21:29:12 Bolander, Mechele Claude ([id number]) -------------------------------------------------------------------------------- Physician Orders Details Patient Name: Pangilinan, Tracker C. Date of Service: 07/24/2017 3:00 PM Medical Record Number: [id number] Patient Account Number: [id number] Date of Birth/Sex: [birth year] (57 y.o. Male) Treating RN: Montey Hora Primary Care Provider: [name] Other Clinician: Referring Provider: [name] Treating Provider/Extender: [name], Phillipe Clemon Weeks in Treatment: 5 Verbal / Phone Orders: No Diagnosis Coding ICD-10 Coding Code Description I89.0 Lymphedema, not elsewhere classified L97.822 Non-pressure chronic ulcer of other part of left lower leg with fat layer exposed E66.09 Other obesity due to excess calories Wound Cleansing Wound #1 Left,Proximal,Medial Lower Leg o Clean wound with Normal Saline. Wound #2 Left,Midline Lower Leg o Clean wound with Normal Saline. Anesthetic (add to Medication List) Wound #1 Left,Proximal,Medial Lower Leg o Topical Lidocaine 4% cream applied to wound bed prior to debridement (In Clinic Only). Wound #2 Left,Midline Lower Leg o Topical Lidocaine 4% cream applied to wound bed prior to debridement (In Clinic Only). Primary Wound Dressing Wound #1 Left,Proximal,Medial Lower Leg o Santyl Ointment - apply saline soaked gauze over santyl Wound #2 Left,Midline Lower Leg o Santyl Ointment - apply saline soaked gauze over santyl Secondary Dressing Wound #1 Left,Proximal,Medial Lower Leg o Non-adherent pad - secured with tape Wound #2 Left,Midline Lower Leg o Non-adherent pad - secured with tape Dressing Change Frequency Wound #1 Left,Proximal,Medial Lower Leg o Change  dressing every day. Wound #2 Left,Midline Lower Leg o Change dressing every day. Follow-up Appointments DEPAUL, ARIZPE ([id number]) Wound #1 Left,Proximal,Medial Lower Leg o Return Appointment in 2 weeks. Wound #2 Left,Midline Lower Leg o Return Appointment in 2 weeks. Edema Control o Patient to wear own compression stockings Patient Medications Allergies: sulfa Notifications Medication Indication Start End Santyl 07/24/2017 DOSE topical 250 unit/gram ointment - ointment topical applied nickel thick to the wound bed and then covered with saline soaked gauze and a bandage daily Electronic Signature(s) Signed: 07/24/2017 3:44:47 PM By: Worthy Keeler PA-C Entered By: Worthy Keeler on  07/24/2017 15:44:47 Bellina, Pinchos C. ([id number]) -------------------------------------------------------------------------------- Problem List Details Patient Name: Schinke, Oluwaferanmi C. Date of Service: 07/24/2017 3:00 PM Medical Record Number: [id number] Patient Account Number: [id number] Date of Birth/Sex: September 20, 1960 (57 y.o. Male) Treating RN: Montey Hora Primary Care Provider: [name] Other Clinician: Referring Provider: [name] Treating Provider/Extender: [name], Khalif Stender Weeks in Treatment: 5 Active Problems ICD-10 Encounter Code Description Active Date Diagnosis I89.0 Lymphedema, not elsewhere classified 06/22/2017 Yes L97.822 Non-pressure chronic ulcer of other part of left lower leg with fat 06/22/2017 Yes layer exposed E66.09 Other obesity due to excess calories 06/22/2017 Yes Inactive Problems Resolved Problems Electronic Signature(s) Signed: 07/25/2017 12:37:14 PM By: Worthy Keeler PA-C Entered By: Worthy Keeler on 07/24/2017 15:04:39 Pola, Nickholas C. ([id number]) -------------------------------------------------------------------------------- Progress Note Details Patient Name: Anschutz, Cailen C. Date of Service: 07/24/2017 3:00 PM Medical Record Number: [id number] Patient Account Number: [id number] Date of Birth/Sex: Feb 28, 1961 (57 y.o. Male) Treating RN: Montey Hora Primary Care Provider: [name] Other Clinician: Referring Provider: [name] Treating Provider/Extender: [name], Cage Gupton Weeks in Treatment: 5 Subjective Chief Complaint Information obtained from Patient He is here in follow up for lle venous ulcers History of Present Illness (HPI) 06/25/17-he is here in follow-up evaluation for left lower extremity, medial malleolus, venous ulcers. He is compliant with compression stocking where although his compression stocking does not fit appropriately in length. He has been advised to contact elastic therapy, who measured  and provided his current stockings, regarding a more appropriate fitted stocking. He is voicing no complaints or concerns, tolerated debridement. There is improvement in appearance, no significant change in measurements. We will continue with Santyl ointment, will apply to all 3 wounds. He will follow-up next week 06/30/17-he is here earlier than his scheduled appointment on Friday, per his request, secondary to new open areas. After inquiry, it appears that the new superficial open areas are secondary to adhesive removal. There is no evidence of infection, minimal/scant drainage, most of the new areas are epithelialized. His chronic wounds are stable in appearance, will continue with Santyl. He wants to maintain Friday appointment and will follow-up next Friday, he was encouraged to contact clinic with any ulcer changes or concerns 07/24/17 On evaluation today patient appears to be doing well in regard to left lower extremity ulcer. He has been tolerating the dressing changes without complication. He still continues to use the [place] which does seem to be helpful for him this is excellent news. Definitely no evidence of infection today which is good news. Overall I'm pleased with how things are going. Patient likewise is also pleased. Patient History Information obtained from Patient. Family History Cancer - Father, Heart Disease - Father,Siblings, No family history of Diabetes, Hereditary Spherocytosis, Hypertension, Kidney Disease, Lung Disease, Seizures, Stroke, Thyroid Problems, Tuberculosis. Social History Current some day smoker, Marital Status - Separated, Alcohol Use - Rarely, Drug Use - No History,  Caffeine Use - Daily. Review of Systems (ROS) Constitutional Symptoms (General Health) Denies complaints or symptoms of Fever, Chills, Marked Weight Change. Respiratory The patient has no complaints or symptoms. Cardiovascular Complains or has symptoms of LE edema. Psychiatric The  patient has no complaints or symptoms. Vining, Mihir C. ([id number]) Objective Constitutional Well-nourished and well-hydrated in no acute distress. Vitals Time Taken: 3:10 PM, Height: 75 in, Weight: 179 lbs, BMI: 22.4, Temperature: 98.2 F, Pulse: 90 bpm, Respiratory Rate: 18 breaths/min, Blood Pressure: 146/75 mmHg. Respiratory normal breathing without difficulty. Psychiatric this patient is able to make decisions and demonstrates good insight into disease process. Alert and Oriented x 3. pleasant and cooperative. General Notes: Patient's wound on evaluation today does show some Slough covering at this point this did require sharp debridement which was performed today without complication post debridement patient's ulcer did appear to be much better with excellent granulation. Integumentary (Hair, Skin) Wound #1 status is Open. Original cause of wound was Gradually Appeared. The wound is located on the Left,Proximal,Medial Lower Leg. The wound measures 0.7cm length x 1.2cm width x 0.1cm depth; 0.66cm^2 area and 0.066cm^3 volume. There is Fat Layer (Subcutaneous Tissue) Exposed exposed. There is a medium amount of serosanguineous drainage noted. The wound margin is flat and intact. There is small (1-33%) red granulation within the wound bed. There is a large (67-100%) amount of necrotic tissue within the wound bed including Adherent Slough. The periwound skin appearance exhibited: Excoriation, Scarring, Erythema. The periwound skin appearance did not exhibit: Callus, Crepitus, Induration, Rash, Dry/Scaly, Maceration, Atrophie Blanche, Cyanosis, Ecchymosis, Hemosiderin Staining, Mottled, Pallor, Rubor. The surrounding wound skin color is noted with erythema which is circumferential. Periwound temperature was noted as No Abnormality. The periwound has tenderness on palpation. Wound #2 status is Open. Original cause of wound was Gradually Appeared. The wound is located on the Left,Midline  Lower Leg. The wound measures 2.5cm length x 2.3cm width x 0.1cm depth; 4.516cm^2 area and 0.452cm^3 volume. There is Fat Layer (Subcutaneous Tissue) Exposed exposed. There is no tunneling or undermining noted. There is a large amount of serosanguineous drainage noted. The wound margin is flat and intact. There is small (1-33%) red granulation within the wound bed. There is a large (67-100%) amount of necrotic tissue within the wound bed including Adherent Slough. The periwound skin appearance exhibited: Excoriation, Erythema. The periwound skin appearance did not exhibit: Callus, Crepitus, Induration, Rash, Scarring, Dry/Scaly, Maceration, Atrophie Blanche, Cyanosis, Ecchymosis, Hemosiderin Staining, Mottled, Pallor, Rubor. The surrounding wound skin color is noted with erythema which is circumferential. Periwound temperature was noted as No Abnormality. The periwound has tenderness on palpation. Assessment Active Problems ICD-10 I89.0 - Lymphedema, not elsewhere classified L97.822 - Non-pressure chronic ulcer of other part of left lower leg with fat layer exposed E66.09 - Other obesity due to excess calories Allbee, Trashaun C. ([id number]) Procedures Wound #1 Pre-procedure diagnosis of Wound #1 is a Lymphedema located on the Left,Proximal,Medial Lower Leg . There was a Skin/Subcutaneous Tissue Debridement ([id number]) debridement with total area of 0.84 sq cm performed by STONE III, Atom Solivan E., PA-C. with the following instrument(s): Curette to remove Viable and Non-Viable tissue/material including Fibrin/Slough and Subcutaneous after achieving pain control using Lidocaine 4% Topical Solution. A time out was conducted at 15:37, prior to the start of the procedure. A Minimum amount of bleeding was controlled with Pressure. The procedure was tolerated well with a pain level of 0 throughout and a pain level of 0 following the procedure. Post Debridement Measurements: 0.7cm length  x 1.2cm width x 0.2cm  depth; 0.132cm^3 volume. Character of Wound/Ulcer Post Debridement is improved. Post procedure Diagnosis Wound #1: Same as Pre-Procedure Wound #2 Pre-procedure diagnosis of Wound #2 is a Lymphedema located on the Left,Midline Lower Leg . There was a Skin/Subcutaneous Tissue Debridement ([id number]) debridement with total area of 5.75 sq cm performed by STONE III, Laith Antonelli E., PA-C. with the following instrument(s): Curette to remove Viable and Non-Viable tissue/material including Fibrin/Slough and Subcutaneous after achieving pain control using Lidocaine 4% Topical Solution. A time out was conducted at 15:39, prior to the start of the procedure. A Minimum amount of bleeding was controlled with Pressure. The procedure was tolerated well with a pain level of 0 throughout and a pain level of 0 following the procedure. Post Debridement Measurements: 2.5cm length x 2.3cm width x 0.2cm depth; 0.903cm^3 volume. Character of Wound/Ulcer Post Debridement is improved. Post procedure Diagnosis Wound #2: Same as Pre-Procedure Plan Wound Cleansing: Wound #1 Left,Proximal,Medial Lower Leg: Clean wound with Normal Saline. Wound #2 Left,Midline Lower Leg: Clean wound with Normal Saline. Anesthetic (add to Medication List): Wound #1 Left,Proximal,Medial Lower Leg: Topical Lidocaine 4% cream applied to wound bed prior to debridement (In Clinic Only). Wound #2 Left,Midline Lower Leg: Topical Lidocaine 4% cream applied to wound bed prior to debridement (In Clinic Only). Primary Wound Dressing: Wound #1 Left,Proximal,Medial Lower Leg: Santyl Ointment - apply saline soaked gauze over santyl Wound #2 Left,Midline Lower Leg: Santyl Ointment - apply saline soaked gauze over santyl Secondary Dressing: Wound #1 Left,Proximal,Medial Lower Leg: Non-adherent pad - secured with tape Wound #2 Left,Midline Lower Leg: Non-adherent pad - secured with tape Dressing Change Frequency: Wound #1 Left,Proximal,Medial Lower  Leg: Change dressing every day. Delis, Tristian C. ([id number]) Wound #2 Left,Midline Lower Leg: Change dressing every day. Follow-up Appointments: Wound #1 Left,Proximal,Medial Lower Leg: Return Appointment in 2 weeks. Wound #2 Left,Midline Lower Leg: Return Appointment in 2 weeks. Edema Control: Patient to wear own compression stockings The following medication(s) was prescribed: Santyl topical 250 unit/gram ointment ointment topical applied nickel thick to the wound bed and then covered with saline soaked gauze and a bandage daily starting 07/24/2017 I am going to recommend that we continue with the Current wound care measures for the next week. Hopefully after the following week the Santyl with no longer be necessary. Unless at this should be a refill for him today just in case although I told him if he did absolutely did pick it up not to because we may be switching as of next week. Patient understands. Please see above for specific wound care orders. We will see patient for re-evaluation in 1 week(s) here in the clinic. If anything worsens or changes patient will contact our office for additional recommendations. Electronic Signature(s) Signed: 08/19/2017 3:36:20 AM By: Worthy Keeler PA-C Previous Signature: 07/25/2017 12:37:14 PM Version By: Worthy Keeler PA-C Entered By: Worthy Keeler on 08/19/2017 03:35:14 Baer, Leibish CMarland Kitchen ([id number]) -------------------------------------------------------------------------------- ROS/PFSH Details Patient Name: Schlag, Limuel C. Date of Service: 07/24/2017 3:00 PM Medical Record Number: [id number] Patient Account Number: [id number] Date of Birth/Sex: November 25, 1960 (56 y.o. Male) Treating RN: Montey Hora Primary Care Provider: [name] Other Clinician: Referring Provider: [name] Treating Provider/Extender: [name], Dyshon Philbin Weeks in Treatment: 5 Information Obtained From Patient Wound History Do you currently have one or more  open woundso Yes How many open wounds do you currently haveo 3 Approximately how long have you had your woundso several months How have you been treating your wound(s)  until nowo xeroform gauze Has your wound(s) ever healed and then re-openedo No Have you had any lab work done in the past montho No Have you tested positive for an antibiotic resistant organism (MRSA, VRE)o No Have you tested positive for osteomyelitis (bone infection)o No Have you had any tests for circulation on your legso No Constitutional Symptoms (General Health) Complaints and Symptoms: Negative for: Fever; Chills; Marked Weight Change Cardiovascular Complaints and Symptoms: Positive for: LE edema Medical History: Negative for: Arrhythmia; Congestive Heart Failure; Coronary Artery Disease; Deep Vein Thrombosis; Hypertension; Hypotension; Myocardial Infarction; Peripheral Arterial Disease; Peripheral Venous Disease; Phlebitis; Vasculitis Eyes Medical History: Negative for: Cataracts; Glaucoma; Optic Neuritis Hematologic/Lymphatic Medical History: Negative for: Anemia; Hemophilia; Human Immunodeficiency Virus; Lymphedema; Sickle Cell Disease Respiratory Complaints and Symptoms: No Complaints or Symptoms Medical History: Positive for: Sleep Apnea Negative for: Aspiration; Asthma; Chronic Obstructive Pulmonary Disease (COPD); Pneumothorax; Tuberculosis Gastrointestinal Jarvie, Davon C. ([id number]) Medical History: Negative for: Cirrhosis ; Colitis; Crohnos; Hepatitis A; Hepatitis B; Hepatitis C Endocrine Medical History: Negative for: Type I Diabetes; Type II Diabetes Genitourinary Medical History: Negative for: End Stage Renal Disease Immunological Medical History: Negative for: Lupus Erythematosus; Raynaudos; Scleroderma Integumentary (Skin) Medical History: Negative for: History of Burn Musculoskeletal Medical History: Negative for: Gout; Rheumatoid Arthritis; Osteoarthritis;  Osteomyelitis Neurologic Medical History: Negative for: Dementia; Neuropathy; Paraplegia; Seizure Disorder Psychiatric Complaints and Symptoms: No Complaints or Symptoms Medical History: Positive for: Confinement Anxiety Negative for: Anorexia/bulimia Immunizations Pneumococcal Vaccine: Received Pneumococcal Vaccination: No Implantable Devices Family and Social History Cancer: Yes - Father; Diabetes: No; Heart Disease: Yes - Father,Siblings; Hereditary Spherocytosis: No; Hypertension: No; Kidney Disease: No; Lung Disease: No; Seizures: No; Stroke: No; Thyroid Problems: No; Tuberculosis: No; Current some day smoker; Marital Status - Separated; Alcohol Use: Rarely; Drug Use: No History; Caffeine Use: Daily; Financial Concerns: No; Food, Clothing or Shelter Needs: No; Support System Lacking: No; Transportation Concerns: No; Advanced Directives: No; Patient does not want information on Advanced Directives; Do not resuscitate: No; Living Will: No; Medical Power of Attorney: No Physician Affirmation I have reviewed and agree with the above information. TARIS, GALINDO ([id number]) Electronic Signature(s) Signed: 07/25/2017 12:37:14 PM By: Worthy Keeler PA-C Signed: 07/27/2017 4:35:46 PM By: Montey Hora Entered By: Worthy Keeler on 07/24/2017 21:28:32 [place]ka, Mechele Claude ([id number]) -------------------------------------------------------------------------------- SuperBill Details Patient Name: Quebedeaux, Torin C. Date of Service: 07/24/2017 Medical Record Number: [id number] Patient Account Number: [id number] Date of Birth/Sex: 03/08/1961 (57 y.o. 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

## 2017-07-30 ENCOUNTER — Encounter: Payer: Self-pay | Admitting: Physician Assistant

## 2017-07-30 DIAGNOSIS — B958 Unspecified staphylococcus as the cause of diseases classified elsewhere: Secondary | ICD-10-CM | POA: Insufficient documentation

## 2017-07-30 DIAGNOSIS — L089 Local infection of the skin and subcutaneous tissue, unspecified: Secondary | ICD-10-CM

## 2017-07-30 NOTE — Progress Notes (Signed)
Good morning [name],  Your wound culture's final result came through today. It did show a staph infection that is resistant to fluoroquinolones. It was sensitive for Doxycycline, which you were treated with.  Let me know how things are going with the wound clinic.  Best, Evlyn Clines

## 2017-07-31 ENCOUNTER — Encounter: Payer: 59 | Attending: Physician Assistant | Admitting: Physician Assistant

## 2017-07-31 DIAGNOSIS — Z8249 Family history of ischemic heart disease and other diseases of the circulatory system: Secondary | ICD-10-CM | POA: Diagnosis not present

## 2017-07-31 DIAGNOSIS — I739 Peripheral vascular disease, unspecified: Secondary | ICD-10-CM | POA: Insufficient documentation

## 2017-07-31 DIAGNOSIS — I890 Lymphedema, not elsewhere classified: Secondary | ICD-10-CM | POA: Diagnosis not present

## 2017-07-31 DIAGNOSIS — L97822 Non-pressure chronic ulcer of other part of left lower leg with fat layer exposed: Secondary | ICD-10-CM | POA: Diagnosis not present

## 2017-07-31 DIAGNOSIS — F419 Anxiety disorder, unspecified: Secondary | ICD-10-CM | POA: Diagnosis not present

## 2017-07-31 DIAGNOSIS — G4730 Sleep apnea, unspecified: Secondary | ICD-10-CM | POA: Diagnosis not present

## 2017-07-31 DIAGNOSIS — Z809 Family history of malignant neoplasm, unspecified: Secondary | ICD-10-CM | POA: Diagnosis not present

## 2017-08-03 NOTE — Progress Notes (Signed)
RAIN, FRIEDT ([id number]) Visit Report for 07/31/2017 Arrival Information Details Patient Name: Crumm, Mykale C. Date of Service: 07/31/2017 3:45 PM Medical Record Number: [id number] Patient Account Number: [id number] Date of Birth/Sex: [birth year] (57 y.o. Male) Treating RN: Ahmed Prima Primary Care Josette Shimabukuro: [name] Other Clinician: Referring Nowell Sites: [name] Treating Jayveion Stalling/Extender: [name], [name] Weeks in Treatment: 6 Visit Information History Since Last Visit All ordered tests and consults were completed: No Patient Arrived: Ambulatory Added or deleted any medications: No Arrival Time: 16:27 Any new allergies or adverse reactions: No Accompanied By: self Had a fall or experienced change in No Transfer Assistance: None activities of daily living that may affect Patient Identification Verified: Yes risk of falls: Secondary Verification Process Completed: Yes Signs or symptoms of abuse/neglect since last visito No Patient Requires Transmission-Based No Hospitalized since last visit: No Precautions: Has Dressing in Place as Prescribed: Yes Patient Has Alerts: No Pain Present Now: Yes Electronic Signature(s) Signed: 07/31/2017 5:53:16 PM By: Alric Quan Entered By: Alric Quan on 07/31/2017 16:28:34 Nasser, Hearl CMarland Kitchen ([id number]) -------------------------------------------------------------------------------- Clinic Level of Care Assessment Details Patient Name: Herard, Perrion C. Date of Service: 07/31/2017 3:45 PM Medical Record Number: [id number] Patient Account Number: [id number] Date of Birth/Sex: [birth year] (57 y.o. Male) Treating RN: [name] Primary Care Damarius Karnes: [name] Other Clinician: Referring Analucia Hush: [name] Treating Leilan Bochenek/Extender: [name], [name] Weeks in Treatment: 6 Clinic Level of Care Assessment Items TOOL 4 Quantity Score []  - Use when only an EandM is performed on FOLLOW-UP visit  0 ASSESSMENTS - Nursing Assessment / Reassessment []  - Reassessment of Co-morbidities (includes updates in patient status) 0 []  - 0 Reassessment of Adherence to Treatment Plan ASSESSMENTS - Wound and Skin Assessment / Reassessment X - Simple Wound Assessment / Reassessment - one wound 1 5 []  - 0 Complex Wound Assessment / Reassessment - multiple wounds []  - 0 Dermatologic / Skin Assessment (not related to wound area) ASSESSMENTS - Focused Assessment []  - Circumferential Edema Measurements - multi extremities 0 []  - 0 Nutritional Assessment / Counseling / Intervention []  - 0 Lower Extremity Assessment (monofilament, tuning fork, pulses) []  - 0 Peripheral Arterial Disease Assessment (using hand held doppler) ASSESSMENTS - Ostomy and/or Continence Assessment and Care []  - Incontinence Assessment and Management 0 []  - 0 Ostomy Care Assessment and Management (repouching, etc.) PROCESS - Coordination of Care X - Simple Patient / Family Education for ongoing care 1 15 []  - 0 Complex (extensive) Patient / Family Education for ongoing care X- 1 10 Staff obtains Programmer, systems, Records, Test Results / Process Orders []  - 0 Staff telephones HHA, Nursing Homes / Clarify orders / etc []  - 0 Routine Transfer to another Facility (non-emergent condition) []  - 0 Routine Hospital Admission (non-emergent condition) []  - 0 New Admissions / Biomedical engineer / Ordering NPWT, Apligraf, etc. []  - 0 Emergency Hospital Admission (emergent condition) X- 1 10 Simple Discharge Coordination Rocks, Brexton C. ([id number]) []  - 0 Complex (extensive) Discharge Coordination PROCESS - Special Needs []  - Pediatric / Minor Patient Management 0 []  - 0 Isolation Patient Management []  - 0 Hearing / Language / Visual special needs []  - 0 Assessment of Community assistance (transportation, D/C planning, etc.) []  - 0 Additional assistance / Altered mentation []  - 0 Support Surface(s) Assessment (bed,  cushion, seat, etc.) INTERVENTIONS - Wound Cleansing / Measurement X - Simple Wound Cleansing - one wound 1 5 []  - 0 Complex Wound Cleansing - multiple wounds X- 1 5 Wound Imaging (photographs - any  number of wounds) []  - 0 Wound Tracing (instead of photographs) X- 1 5 Simple Wound Measurement - one wound []  - 0 Complex Wound Measurement - multiple wounds INTERVENTIONS - Wound Dressings []  - Small Wound Dressing one or multiple wounds 0 X- 1 15 Medium Wound Dressing one or multiple wounds []  - 0 Large Wound Dressing one or multiple wounds []  - 0 Application of Medications - topical []  - 0 Application of Medications - injection INTERVENTIONS - Miscellaneous []  - External ear exam 0 []  - 0 Specimen Collection (cultures, biopsies, blood, body fluids, etc.) []  - 0 Specimen(s) / Culture(s) sent or taken to Lab for analysis []  - 0 Patient Transfer (multiple staff / Civil Service fast streamer / Similar devices) []  - 0 Simple Staple / Suture removal (25 or less) []  - 0 Complex Staple / Suture removal (26 or more) []  - 0 Hypo / Hyperglycemic Management (close monitor of Blood Glucose) []  - 0 Ankle / Brachial Index (ABI) - do not check if billed separately X- 1 5 Vital Signs Kraszewski, Yer C. ([id number]) Has the patient been seen at the hospital within the last three years: Yes Total Score: 75 Level Of Care: New/Established - Level 2 Electronic Signature(s) Signed: 08/03/2017 10:40:22 AM By: [name], [name], RN, CWS, [name], [name] Entered By: [name], [name], RN, CWS, [name] on 07/31/2017 17:08:41 [place], Mechele Claude ([id number]) -------------------------------------------------------------------------------- Encounter Discharge Information Details Patient Name: Kyler, Djimon C. Date of Service: 07/31/2017 3:45 PM Medical Record Number: [id number] Patient Account Number: [id number] Date of Birth/Sex: [birth year] (57 y.o. Male) Treating RN: [name] Primary Care Lauralye Kinn: [name] Other Clinician: Referring  Hyde Sires: [name] Treating Shanele Nissan/Extender: [name], [name] Weeks in Treatment: 6 Encounter Discharge Information Items Discharge Pain Level: 0 Discharge Condition: Stable Ambulatory Status: Ambulatory Discharge Destination: Home Transportation: Private Auto Accompanied By: self Schedule Follow-up Appointment: Yes Medication Reconciliation completed and Yes provided to Patient/Care Brigitta Pricer: Provided on Clinical Summary of Care: 07/31/2017 Form Type Recipient Paper Patient LF Electronic Signature(s) Signed: 08/03/2017 10:40:22 AM By: [name], [name], RN, CWS, [name], [name] Entered By: [name], [name], RN, CWS, [name] on 07/31/2017 17:07:05 [place], Mechele Claude ([id number]) -------------------------------------------------------------------------------- Lower Extremity Assessment Details Patient Name: Athanas, Austine C. Date of Service: 07/31/2017 3:45 PM Medical Record Number: [id number] Patient Account Number: [id number] Date of Birth/Sex: Dec 06, 1960 (58 y.o. Male) Treating RN: Ahmed Prima Primary Care Deedra Pro: [name] Other Clinician: Referring Kathleen Likins: [name] Treating Raliyah Montella/Extender: [name], [name] Weeks in Treatment: 6 Edema Assessment Assessed: [Left: No] [Right: No] [Left: Edema] [Right: :] Calf Left: Right: Point of Measurement: 37 cm From Medial Instep 44.8 cm cm Ankle Left: Right: Point of Measurement: 12 cm From Medial Instep 28.5 cm cm Vascular Assessment Pulses: Dorsalis Pedis Palpable: [Left:Yes] Posterior Tibial Extremity colors, hair growth, and conditions: Extremity Color: [Left:Normal] Hair Growth on Extremity: [Left:Yes] Temperature of Extremity: [Left:Warm] Capillary Refill: [Left:< 3 seconds] Toe Nail Assessment Left: Right: Thick: Yes Discolored: No Deformed: Yes Improper Length and Hygiene: Yes Electronic Signature(s) Signed: 07/31/2017 5:53:16 PM By: Alric Quan Entered By: Alric Quan on 07/31/2017  16:36:28 Streb, Lenon C. ([id number]) -------------------------------------------------------------------------------- Multi Wound Chart Details Patient Name: Klunk, Hasan C. Date of Service: 07/31/2017 3:45 PM Medical Record Number: [id number] Patient Account Number: [id number] Date of Birth/Sex: July 24, 1960 (57 y.o. Male) Treating RN: [name] Primary Care Zoraida Havrilla: [name] Other Clinician: Referring Stanislaw Acton: [name] Treating Sway Guttierrez/Extender: [name], [name] Weeks in Treatment: 6 Vital Signs Height(in): 75 Pulse(bpm): 83 Weight(lbs): 179 Blood Pressure(mmHg): 152/98 Body Mass Index(BMI):  22 Temperature(F): 98.0 Respiratory Rate 18 (breaths/min): Photos: [1:No Photos] [2:No Photos] [N/A:N/A] Wound Location: [1:Left Lower Leg - Medial, Proximal] [2:Left, Midline Lower Leg] [N/A:N/A] Wounding Event: [1:Gradually Appeared] [2:Gradually Appeared] [N/A:N/A] Primary Etiology: [1:Lymphedema] [2:Lymphedema] [N/A:N/A] Comorbid History: [1:Sleep Apnea, Confinement Anxiety] [2:Sleep Apnea, Confinement Anxiety] [N/A:N/A] Date Acquired: [1:01/17/2017] [2:01/17/2017] [N/A:N/A] Weeks of Treatment: [1:6] [2:6] [N/A:N/A] Wound Status: [1:Open] [2:Open] [N/A:N/A] Measurements L x W x D [1:0.9x1.3x0.1] [2:2.5x2.2x0.1] [N/A:N/A] (cm) Area (cm) : [1:0.919] [2:4.32] [N/A:N/A] Volume (cm) : [1:0.092] [2:0.432] [N/A:N/A] % Reduction in Area: [1:-33.00%] [2:15.40%] [N/A:N/A] % Reduction in Volume: [1:-33.30%] [2:15.50%] [N/A:N/A] Classification: [1:Full Thickness Without Exposed Support Structures] [2:Full Thickness Without Exposed Support Structures] [N/A:N/A] Exudate Amount: [1:Large] [2:Large] [N/A:N/A] Exudate Type: [1:Serosanguineous] [2:Serosanguineous] [N/A:N/A] Exudate Color: [1:red, brown] [2:red, brown] [N/A:N/A] Wound Margin: [1:N/A] [2:Distinct, outline attached] [N/A:N/A] Granulation Amount: [1:None Present (0%)] [2:None Present (0%)] [N/A:N/A] Necrotic  Amount: [1:Large (67-100%)] [2:Large (67-100%)] [N/A:N/A] Epithelialization: [1:N/A] [2:None] [N/A:N/A] Periwound Skin Texture: [1:No Abnormalities Noted] [2:No Abnormalities Noted] [N/A:N/A] Periwound Skin Moisture: [1:No Abnormalities Noted] [2:No Abnormalities Noted] [N/A:N/A] Periwound Skin Color: [1:No Abnormalities Noted] [2:No Abnormalities Noted] [N/A:N/A] Temperature: [1:No Abnormality] [2:No Abnormality] [N/A:N/A] Tenderness on Palpation: [1:Yes] [2:Yes] [N/A:N/A] Wound Preparation: [1:Ulcer Cleansing: Rinsed/Irrigated with Saline] [2:Ulcer Cleansing: Rinsed/Irrigated with Saline] [N/A:N/A] Topical Anesthetic Applied: Topical Anesthetic Applied: Other: lidocaine 4% Other: lidocaine 4% Treatment Notes TESLA, BOCHICCHIO ([id number]) Electronic Signature(s) Signed: 08/03/2017 10:40:22 AM By: [name], [name], RN, CWS, [name], [name] Entered By: [name], [name], RN, CWS, [name] on 07/31/2017 16:58:07 Eno, Mechele Claude ([id number]) -------------------------------------------------------------------------------- Multi-Disciplinary Care Plan Details Patient Name: Wisman, Ashtyn C. Date of Service: 07/31/2017 3:45 PM Medical Record Number: [id number] Patient Account Number: [id number] Date of Birth/Sex: 1960-08-09 (57 y.o. Male) Treating RN: [name] Primary Care Jaritza Duignan: [name] Other Clinician: Referring Delayla Hoffmaster: [name] Treating Kyrstal Monterrosa/Extender: [name], [name] Weeks in Treatment: 6 Active Inactive ` Orientation to the Wound Care Program Nursing Diagnoses: Knowledge deficit related to the wound healing center program Goals: Patient/caregiver will verbalize understanding of the [place] Program Date Initiated: 06/19/2017 Target Resolution Date: 07/10/2017 Goal Status: Active Interventions: Provide education on orientation to the wound center Notes: ` Wound/Skin Impairment Nursing Diagnoses: Impaired tissue integrity Goals: Patient/caregiver will verbalize  understanding of skin care regimen Date Initiated: 06/19/2017 Target Resolution Date: 07/10/2017 Goal Status: Active Ulcer/skin breakdown will have a volume reduction of 30% by week 4 Date Initiated: 06/19/2017 Target Resolution Date: 07/10/2017 Goal Status: Active Interventions: Assess patient/caregiver ability to obtain necessary supplies Assess patient/caregiver ability to perform ulcer/skin care regimen upon admission and as needed Assess ulceration(s) every visit Treatment Activities: Skin care regimen initiated : 06/19/2017 Notes: Electronic Signature(s) Signed: 08/03/2017 10:40:22 AM By: [name], [name], RN, CWS, [name], [name] [place], [place] ([id number]) Entered By: [name], [name], RN, CWS, [name] on 07/31/2017 16:58:01 [place], Kerrion C. ([id number]) -------------------------------------------------------------------------------- Pain Assessment Details Patient Name: Phifer, Taavi C. Date of Service: 07/31/2017 3:45 PM Medical Record Number: [id number] Patient Account Number: [id number] Date of Birth/Sex: January 26, 1961 (57 y.o. Male) Treating RN: Ahmed Prima Primary Care Eveleen Mcnear: [name] Other Clinician: Referring Keelie Zemanek: [name] Treating Jermiah Howton/Extender: [name], [name] Weeks in Treatment: 6 Active Problems Location of Pain Severity and Description of Pain Patient Has Paino Yes Site Locations Pain Location: Generalized Pain Rate the pain. Current Pain Level: 7 Character of Pain Describe the Pain: Aching Pain Management and Medication Current Pain Management: Notes Topical or injectable lidocaine is offered to patient for acute pain when surgical debridement is performed. If needed, Patient is instructed  to use over the counter pain medication for the following 24-48 hours after debridement. Wound care MDs do not prescribed pain medications. Patient has chronic pain or uncontrolled pain. Patient has been instructed to make an appointment with their Primary  Care Physician for pain management. Electronic Signature(s) Signed: 07/31/2017 5:53:16 PM By: Alric Quan Entered By: Alric Quan on 07/31/2017 16:29:11 Jessamine, Hady Loletha Grayer ([id number]) -------------------------------------------------------------------------------- Patient/Caregiver Education Details Patient Name: Gonyer, Frazier C. Date of Service: 07/31/2017 3:45 PM Medical Record Number: [id number] Patient Account Number: [id number] Date of Birth/Gender: Oct 26, 1960 (57 y.o. Male) Treating RN: [name] Primary Care Physician: [name] Other Clinician: Referring Physician: [name] Treating Physician/Extender: Sharalyn Ink in Treatment: 6 Education Assessment Education Provided To: Patient Education Topics Provided Wound/Skin Impairment: Handouts: Caring for Your Ulcer Methods: Demonstration, Explain/Verbal Responses: State content correctly Electronic Signature(s) Signed: 08/03/2017 10:40:22 AM By: [name], [name], RN, CWS, [name], [name] Entered By: [name], [name], RN, CWS, [name] on 07/31/2017 17:07:21 Friscia, Mechele Claude ([id number]) -------------------------------------------------------------------------------- Wound Assessment Details Patient Name: Neyra, Taahir C. Date of Service: 07/31/2017 3:45 PM Medical Record Number: [id number] Patient Account Number: [id number] Date of Birth/Sex: Apr 20, 1961 (57 y.o. Male) Treating RN: Ahmed Prima Primary Care Tanuj Mullens: [name] Other Clinician: Referring Cass Edinger: [name] Treating Balinda Heacock/Extender: [name], [name] Weeks in Treatment: 6 Wound Status Wound Number: 1 Primary Etiology: Lymphedema Wound Location: Left Lower Leg - Medial, Proximal Wound Status: Open Wounding Event: Gradually Appeared Comorbid History: Sleep Apnea, Confinement Anxiety Date Acquired: 01/17/2017 Weeks Of Treatment: 6 Clustered Wound: No Photos Photo Uploaded By: Roger Shelter on 07/31/2017 17:53:31 Wound  Measurements Length: (cm) 0.9 Width: (cm) 1.3 Depth: (cm) 0.1 Area: (cm) 0.919 Volume: (cm) 0.092 % Reduction in Area: -33% % Reduction in Volume: -33.3% Tunneling: No Undermining: No Wound Description Full Thickness Without Exposed Support Foul Classification: Structures Slou Exudate Large Amount: Exudate Type: Serosanguineous Exudate Color: red, brown Odor After Cleansing: No gh/Fibrino Yes Wound Bed Granulation Amount: None Present (0%) Necrotic Amount: Large (67-100%) Necrotic Quality: Adherent Slough Periwound Skin Texture Texture Color No Abnormalities Noted: No No Abnormalities Noted: No Moisture Temperature / Pain Fossum, Alarik C. ([id number]) No Abnormalities Noted: No Temperature: No Abnormality Tenderness on Palpation: Yes Wound Preparation Ulcer Cleansing: Rinsed/Irrigated with Saline Topical Anesthetic Applied: Other: lidocaine 4%, Treatment Notes Wound #1 (Left, Proximal, Medial Lower Leg) 1. Cleansed with: Clean wound with Normal Saline 2. Anesthetic Topical Lidocaine 4% cream to wound bed prior to debridement 4. Dressing Applied: Prisma Ag 5. Secondary Dressing Applied ABD and Kerlix/Conform Electronic Signature(s) Signed: 07/31/2017 5:53:16 PM By: Alric Quan Entered By: Alric Quan on 07/31/2017 16:33:30 Fomby, Ranbir C. ([id number]) -------------------------------------------------------------------------------- Wound Assessment Details Patient Name: Voth, Gaetano C. Date of Service: 07/31/2017 3:45 PM Medical Record Number: [id number] Patient Account Number: [id number] Date of Birth/Sex: 01-17-1961 (57 y.o. Male) Treating RN: Ahmed Prima Primary Care Chyla Schlender: [name] Other Clinician: Referring Taylour Lietzke: [name] Treating Yannely Kintzel/Extender: [name], [name] Weeks in Treatment: 6 Wound Status Wound Number: 2 Primary Etiology: Lymphedema Wound Location: Left, Midline Lower Leg Wound Status: Open Wounding  Event: Gradually Appeared Comorbid History: Sleep Apnea, Confinement Anxiety Date Acquired: 01/17/2017 Weeks Of Treatment: 6 Clustered Wound: No Photos Photo Uploaded By: Roger Shelter on 07/31/2017 17:53:44 Wound Measurements Length: (cm) 2.5 Width: (cm) 2.2 Depth: (cm) 0.1 Area: (cm) 4.32 Volume: (cm) 0.432 % Reduction in Area: 15.4% % Reduction in Volume: 15.5% Epithelialization: None Tunneling: No Undermining: No Wound Description Full Thickness Without Exposed Support Classification: Structures Wound Margin: Distinct, outline  attached Exudate Large Amount: Exudate Type: Serosanguineous Exudate Color: red, brown Foul Odor After Cleansing: No Slough/Fibrino Yes Wound Bed Granulation Amount: None Present (0%) Necrotic Amount: Large (67-100%) Necrotic Quality: Adherent Slough Periwound Skin Texture Texture Color No Abnormalities Noted: No No Abnormalities Noted: No Subramaniam, Fausto C. ([id number]) Moisture Temperature / Pain No Abnormalities Noted: No Temperature: No Abnormality Tenderness on Palpation: Yes Wound Preparation Ulcer Cleansing: Rinsed/Irrigated with Saline Topical Anesthetic Applied: Other: lidocaine 4%, Treatment Notes Wound #2 (Left, Midline Lower Leg) 1. Cleansed with: Clean wound with Normal Saline 2. Anesthetic Topical Lidocaine 4% cream to wound bed prior to debridement 4. Dressing Applied: Prisma Ag 5. Secondary Dressing Applied ABD and Kerlix/Conform Electronic Signature(s) Signed: 07/31/2017 5:53:16 PM By: Alric Quan Entered By: Alric Quan on 07/31/2017 16:34:34 Thew, Ashutosh C. ([id number]) -------------------------------------------------------------------------------- Vitals Details Patient Name: Kosanke, Jhon C. Date of Service: 07/31/2017 3:45 PM Medical Record Number: [id number] Patient Account Number: [id number] Date of Birth/Sex: November 18, 1960 (57 y.o. Male) Treating RN: Ahmed Prima Primary Care Knoah Nedeau: [name] Other Clinician: Referring Ikia Cincotta: [name] Treating Jerah Esty/Extender: [name], [name] Weeks in Treatment: 6 Vital Signs Time Taken: 16:29 Temperature (F): 98.0 Height (in): 75 Pulse (bpm): 83 Weight (lbs): 179 Respiratory Rate (breaths/min): 18 Body Mass Index (BMI): 22.4 Blood Pressure (mmHg): 152/98 Reference Range: 80 - 120 mg / dl Electronic Signature(s) Signed: 07/31/2017 5:53:16 PM By: Alric Quan Entered By: Alric Quan on 07/31/2017 16:29:48

## 2017-08-03 NOTE — Progress Notes (Signed)
[name], [name] ([id number]) Visit Report for 07/31/2017 Chief Complaint Document Details Patient Name: [name], [name] Date of Service: 07/31/2017 3:45 PM Medical Record Number: [id number] Patient Account Number: [id number] Date of Birth/Sex: 12/10/1960 (57 y.o. Male) Treating RN: [name] Primary Care Provider: [name] Other Clinician: Referring Provider: [name] Treating Provider/Extender: [name], [name] Weeks in Treatment: 6 Information Obtained from: Patient Chief Complaint He is here in follow up for lle venous ulcers Electronic Signature(s) Signed: 08/01/2017 10:02:55 PM By: Worthy Keeler PA-C Entered By: Worthy Keeler on 07/31/2017 15:56:22 [name], [name] ([id number]) -------------------------------------------------------------------------------- Debridement Details Patient Name: [name], [name] Date of Service: 07/31/2017 3:45 PM Medical Record Number: [id number] Patient Account Number: [id number] Date of Birth/Sex: 23-May-1961 (57 y.o. Male) Treating RN: [name] Primary Care Provider: [name] Other Clinician: Referring Provider: [name] Treating Provider/Extender: [name], [name] Weeks in Treatment: 6 Debridement Performed for Wound #1 Left,Proximal,Medial Lower Leg Assessment: Performed By: Physician STONE III, [name] E., PA-C Debridement: Debridement Pre-procedure Verification/Time Yes - 16:45 Out Taken: Start Time: 16:45 Pain Control: Lidocaine 4% Topical Solution Level: Skin/Subcutaneous Tissue Total Area Debrided (L x W): 0.9 (cm) x 1.3 (cm) = 1.17 (cm) Tissue and other material Viable, Non-Viable, Exudate, Fibrin/Slough, Subcutaneous debrided: Instrument: Curette Bleeding: Moderate Hemostasis Achieved: Pressure End Time: 16:47 Procedural Pain: 2 Post Procedural Pain: 1 Response to Treatment: Procedure was tolerated well Post Debridement Measurements of Total Wound Length: (cm) 0.9 Width: (cm)  1.3 Depth: (cm) 0.2 Volume: (cm) 0.184 Character of Wound/Ulcer Post Debridement: Improved Post Procedure Diagnosis Same as Pre-procedure Electronic Signature(s) Signed: 08/01/2017 10:02:55 PM By: Worthy Keeler PA-C Signed: 08/03/2017 10:40:22 AM By: [name], [name], RN, CWS, [name], [name] Entered By: Worthy Keeler on 08/01/2017 19:34:41 [name], [name] ([id number]) -------------------------------------------------------------------------------- Debridement Details Patient Name: [name], [name] Date of Service: 07/31/2017 3:45 PM Medical Record Number: [id number] Patient Account Number: [id number] Date of Birth/Sex: Sep 01, 1960 (57 y.o. Male) Treating RN: [name] Primary Care Provider: [name] Other Clinician: Referring Provider: [name] Treating Provider/Extender: [name], [name] Weeks in Treatment: 6 Debridement Performed for Wound #2 Left,Midline Lower Leg Assessment: Performed By: Physician STONE III, [name] E., PA-C Debridement: Debridement Pre-procedure Verification/Time Yes - 16:45 Out Taken: Start Time: 16:48 Pain Control: Lidocaine 4% Topical Solution Level: Skin/Subcutaneous Tissue Total Area Debrided (L x W): 2.5 (cm) x 2.2 (cm) = 5.5 (cm) Tissue and other material Viable, Non-Viable, Exudate, Fibrin/Slough, Subcutaneous debrided: Instrument: Curette Bleeding: Moderate Hemostasis Achieved: Pressure End Time: 16:50 Procedural Pain: 2 Post Procedural Pain: 1 Response to Treatment: Procedure was tolerated well Post Debridement Measurements of Total Wound Length: (cm) 2.5 Width: (cm) 2.2 Depth: (cm) 0.2 Volume: (cm) 0.864 Character of Wound/Ulcer Post Debridement: Improved Post Procedure Diagnosis Same as Pre-procedure Electronic Signature(s) Signed: 08/01/2017 10:02:55 PM By: Worthy Keeler PA-C Signed: 08/03/2017 10:40:22 AM By: [name], [name], RN, CWS, [name], [name] Entered By: Worthy Keeler on 08/01/2017 19:36:04 [name], [name]  ([id number]) -------------------------------------------------------------------------------- HPI Details Patient Name: [name], [name] Date of Service: 07/31/2017 3:45 PM Medical Record Number: [id number] Patient Account Number: [id number] Date of Birth/Sex: 03/23/1961 (57 y.o. Male) Treating RN: [name] Primary Care Provider: [name] Other Clinician: Referring Provider: [name] Treating Provider/Extender: [name], [name] Weeks in Treatment: 6 History of Present Illness HPI Description: 06/25/17-he is here in follow-up evaluation for left lower extremity, medial malleolus, venous ulcers. He is compliant with compression stocking where although his compression stocking does not fit appropriately in  length. He has been advised to contact elastic therapy, who measured and provided his current stockings, regarding a more appropriate fitted stocking. He is voicing no complaints or concerns, tolerated debridement. There is improvement in appearance, no significant change in measurements. We will continue with Santyl ointment, will apply to all 3 wounds. He will follow-up next week 06/30/17-he is here earlier than his scheduled appointment on Friday, per his request, secondary to new open areas. After inquiry, it appears that the new superficial open areas are secondary to adhesive removal. There is no evidence of infection, minimal/scant drainage, most of the new areas are epithelialized. His chronic wounds are stable in appearance, will continue with Santyl. He wants to maintain Friday appointment and will follow-up next Friday, he was encouraged to contact clinic with any ulcer changes or concerns 07/24/17 On evaluation today patient appears to be doing well in regard to left lower extremity ulcer. He has been tolerating the dressing changes without complication. He still continues to use the [place] which does seem to be helpful for him this is excellent news. Definitely  no evidence of infection today which is good news. Overall I'm pleased with how things are going. Patient likewise is also pleased. 07/31/17 on evaluation today patient's wound on the left medial lower extremity appears to be doing okay although he is having a little bit of maceration noted at this point. Obviously this may be due to the drainage coupled with the fact we have been using the Santyl and trying to keep this voice. Obviously it looks as if the wound is a little bit too macerated at this time. Fortunately there does not appear to be any evidence of infection which is good news. No fevers, chills, nausea, or vomiting noted at this time. Electronic Signature(s) Signed: 08/01/2017 10:02:55 PM By: Worthy Keeler PA-C Entered By: Worthy Keeler on 07/31/2017 17:13:53 [name], [name] Kitchen ([id number]) -------------------------------------------------------------------------------- Physical Exam Details Patient Name: Martell, Olander C. Date of Service: 07/31/2017 3:45 PM Medical Record Number: [id number] Patient Account Number: [id number] Date of Birth/Sex: August 25, 1960 (57 y.o. Male) Treating RN: [name] Primary Care Provider: [name] Other Clinician: Referring Provider: [name] Treating Provider/Extender: [name], [name] Weeks in Treatment: 6 Constitutional Obese and well-hydrated in no acute distress. Respiratory normal breathing without difficulty. Cardiovascular 1+ pitting edema of the bilateral lower extremities. Psychiatric this patient is able to make decisions and demonstrates good insight into disease process. Alert and Oriented x 3. pleasant and cooperative. Notes Other than the fact that patients wound appears to show signs of maceration the really does not appear to be any other evidence of infection at this point nor issues. There was some Slough covering the surface of the wound patient did tolerate sharp debridement today with good result. Overall I'm  pleased with how things are progressing at this time. I do believe the ready to switch to the Prisma. Electronic Signature(s) Signed: 08/01/2017 10:02:55 PM By: Worthy Keeler PA-C Entered By: Worthy Keeler on 07/31/2017 17:14:37 [name], [name] ([id number]) -------------------------------------------------------------------------------- Physician Orders Details Patient Name: [name], [name] Date of Service: 07/31/2017 3:45 PM Medical Record Number: [id number] Patient Account Number: [id number] Date of Birth/Sex: [birth year] (57 y.o. Male) Treating RN: [name] Primary Care Provider: [name] Other Clinician: Referring Provider: [name] Treating Provider/Extender: [name], [name] Weeks in Treatment: 6 Verbal / Phone Orders: No Diagnosis Coding ICD-10 Coding Code Description I89.0 Lymphedema, not elsewhere classified L97.822 Non-pressure chronic ulcer of other part of left  lower leg with fat layer exposed E66.09 Other obesity due to excess calories Wound Cleansing Wound #1 Left,Proximal,Medial Lower Leg o Clean wound with Normal Saline. Wound #2 Left,Midline Lower Leg o Clean wound with Normal Saline. Anesthetic (add to Medication List) Wound #1 Left,Proximal,Medial Lower Leg o Topical Lidocaine 4% cream applied to wound bed prior to debridement (In Clinic Only). Wound #2 Left,Midline Lower Leg o Topical Lidocaine 4% cream applied to wound bed prior to debridement (In Clinic Only). Primary Wound Dressing Wound #1 Left,Proximal,Medial Lower Leg o Prisma Ag Wound #2 Left,Midline Lower Leg o Prisma Ag Secondary Dressing Wound #1 Left,Proximal,Medial Lower Leg o Non-adherent pad - secured with tape Wound #2 Left,Midline Lower Leg o Non-adherent pad - secured with tape Dressing Change Frequency Wound #1 Left,Proximal,Medial Lower Leg o Change dressing every other day. Wound #2 Left,Midline Lower Leg o Change dressing every other  day. Follow-up Appointments [name], [name] ([id number]) Wound #1 Left,Proximal,Medial Lower Leg o Return Appointment in 1 week. Wound #2 Left,Midline Lower Leg o Return Appointment in 1 week. Edema Control Wound #1 Left,Proximal,Medial Lower Leg o Patient to wear own compression stockings Wound #2 Left,Midline Lower Leg o Patient to wear own compression stockings Patient Medications Allergies: sulfa Notifications Medication Indication Start End lidocaine DOSE topical 4 % cream - cream topical Electronic Signature(s) Signed: 08/01/2017 10:02:55 PM By: Worthy Keeler PA-C Signed: 08/03/2017 10:40:22 AM By: [name], [name], RN, CWS, [name], [name] Entered By: [name], [name], RN, CWS, [name] on 07/31/2017 17:01:08 [name], [name] ([id number]) -------------------------------------------------------------------------------- Prescription 07/31/2017 Patient Name: Lyall, Kaidence C. Provider: Worthy Keeler PA-C Date of Birth: [birth year] NPI#: [id number] Sex: Jerilynn Mages DEA#: [id number] Phone #: [phone] License #: Patient Address: [facility] [place], [place] [address] [address], [place], [place] [address] [id number] Allergies sulfa Medication Medication: Route: Strength: Form: lidocaine topical 4% cream Class: TOPICAL LOCAL ANESTHETICS Dose: Frequency / Time: Indication: cream topical Number of Refills: Number of Units: 0 Generic Substitution: Start Date: End Date: Administered at [place]: Yes Time Administered: Time Discontinued: Note to Pharmacy: Signature(s): Date(s): Electronic Signature(s) Signed: 08/01/2017 10:02:55 PM By: Worthy Keeler PA-C Signed: 08/03/2017 10:40:22 AM By: [name], [name], RN, CWS, [name], [name] Entered By: [name], [name], RN, CWS, [name] on 07/31/2017 17:01:08 Deroy, LEONDRE TAUL ([id number]) Hatton, Goldman C.  ([id number]) --------------------------------------------------------------------------------  Problem List Details Patient Name: Banik, Advay C. Date of Service: 07/31/2017 3:45 PM Medical Record Number: [id number] Patient Account Number: [id number] Date of Birth/Sex: Jul 10, 1960 (57 y.o. Male) Treating RN: [name] Primary Care Provider: [name] Other Clinician: Referring Provider: [name] Treating Provider/Extender: [name], [name] Weeks in Treatment: 6 Active Problems ICD-10 Encounter Code Description Active Date Diagnosis I89.0 Lymphedema, not elsewhere classified 06/22/2017 Yes L97.822 Non-pressure chronic ulcer of other part of left lower leg with fat 06/22/2017 Yes layer exposed E66.09 Other obesity due to excess calories 06/22/2017 Yes Inactive Problems Resolved Problems Electronic Signature(s) Signed: 08/01/2017 10:02:55 PM By: Worthy Keeler PA-C Entered By: Worthy Keeler on 07/31/2017 15:56:17 Crombie, Treyvion C. ([id number]) -------------------------------------------------------------------------------- Progress Note Details Patient Name: [name], [name] Date of Service: 07/31/2017 3:45 PM Medical Record Number: [id number] Patient Account Number: [id number] Date of Birth/Sex: 10/22/1960 (57 y.o. Male) Treating RN: [name] Primary Care Provider: [name] Other Clinician: Referring Provider: [name] Treating Provider/Extender: [name], [name] Weeks in Treatment: 6 Subjective Chief Complaint Information obtained from Patient He is here in follow  up for lle venous ulcers History of Present Illness (HPI) 06/25/17-he is here in follow-up evaluation for left lower extremity, medial malleolus, venous ulcers. He is compliant with compression stocking where although his compression stocking does not fit appropriately in length. He has been advised to contact elastic therapy, who measured and provided his current stockings,  regarding a more appropriate fitted stocking. He is voicing no complaints or concerns, tolerated debridement. There is improvement in appearance, no significant change in measurements. We will continue with Santyl ointment, will apply to all 3 wounds. He will follow-up next week 06/30/17-he is here earlier than his scheduled appointment on Friday, per his request, secondary to new open areas. After inquiry, it appears that the new superficial open areas are secondary to adhesive removal. There is no evidence of infection, minimal/scant drainage, most of the new areas are epithelialized. His chronic wounds are stable in appearance, will continue with Santyl. He wants to maintain Friday appointment and will follow-up next Friday, he was encouraged to contact clinic with any ulcer changes or concerns 07/24/17 On evaluation today patient appears to be doing well in regard to left lower extremity ulcer. He has been tolerating the dressing changes without complication. He still continues to use the [place] which does seem to be helpful for him this is excellent news. Definitely no evidence of infection today which is good news. Overall I'm pleased with how things are going. Patient likewise is also pleased. 07/31/17 on evaluation today patient's wound on the left medial lower extremity appears to be doing okay although he is having a little bit of maceration noted at this point. Obviously this may be due to the drainage coupled with the fact we have been using the Santyl and trying to keep this voice. Obviously it looks as if the wound is a little bit too macerated at this time. Fortunately there does not appear to be any evidence of infection which is good news. No fevers, chills, nausea, or vomiting noted at this time. Patient History Information obtained from Patient. Family History Cancer - Father, Heart Disease - Father,Siblings, No family history of Diabetes, Hereditary Spherocytosis, Hypertension,  Kidney Disease, Lung Disease, Seizures, Stroke, Thyroid Problems, Tuberculosis. Social History Current some day smoker, Marital Status - Separated, Alcohol Use - Rarely, Drug Use - No History, Caffeine Use - Daily. Review of Systems (ROS) Constitutional Symptoms (General Health) Denies complaints or symptoms of Fever, Chills. Respiratory The patient has no complaints or symptoms. Cardiovascular Complains or has symptoms of LE edema. DREW, LIPS ([id number]) Psychiatric The patient has no complaints or symptoms. Objective Constitutional Obese and well-hydrated in no acute distress. Vitals Time Taken: 4:29 PM, Height: 75 in, Weight: 179 lbs, BMI: 22.4, Temperature: 98.0 F, Pulse: 83 bpm, Respiratory Rate: 18 breaths/min, Blood Pressure: 152/98 mmHg. Respiratory normal breathing without difficulty. Cardiovascular 1+ pitting edema of the bilateral lower extremities. Psychiatric this patient is able to make decisions and demonstrates good insight into disease process. Alert and Oriented x 3. pleasant and cooperative. General Notes: Other than the fact that patients wound appears to show signs of maceration the really does not appear to be any other evidence of infection at this point nor issues. There was some Slough covering the surface of the wound patient did tolerate sharp debridement today with good result. Overall I'm pleased with how things are progressing at this time. I do believe the ready to switch to the Prisma. Integumentary (Hair, Skin) Wound #1 status is Open. Original cause of wound  was Gradually Appeared. The wound is located on the Left,Proximal,Medial Lower Leg. The wound measures 0.9cm length x 1.3cm width x 0.1cm depth; 0.919cm^2 area and 0.092cm^3 volume. There is no tunneling or undermining noted. There is a large amount of serosanguineous drainage noted. There is no granulation within the wound bed. There is a large (67-100%) amount of necrotic tissue within the  wound bed including Adherent Slough. Periwound temperature was noted as No Abnormality. The periwound has tenderness on palpation. Wound #2 status is Open. Original cause of wound was Gradually Appeared. The wound is located on the Left,Midline Lower Leg. The wound measures 2.5cm length x 2.2cm width x 0.1cm depth; 4.32cm^2 area and 0.432cm^3 volume. There is no tunneling or undermining noted. There is a large amount of serosanguineous drainage noted. The wound margin is distinct with the outline attached to the wound base. There is no granulation within the wound bed. There is a large (67-100%) amount of necrotic tissue within the wound bed including Adherent Slough. Periwound temperature was noted as No Abnormality. The periwound has tenderness on palpation. Assessment Active Problems ICD-10 [name], [name]. ([id number]) I89.0 - Lymphedema, not elsewhere classified L97.822 - Non-pressure chronic ulcer of other part of left lower leg with fat layer exposed E66.09 - Other obesity due to excess calories Procedures Wound #1 Pre-procedure diagnosis of Wound #1 is a Lymphedema located on the Left,Proximal,Medial Lower Leg . There was a Skin/Subcutaneous Tissue Debridement ([id number]) debridement with total area of 1.17 sq cm performed by STONE III, [name] E., PA-C. with the following instrument(s): Curette to remove Viable and Non-Viable tissue/material including Exudate, Fibrin/Slough, and Subcutaneous after achieving pain control using Lidocaine 4% Topical Solution. A time out was conducted at 16:45, prior to the start of the procedure. A Moderate amount of bleeding was controlled with Pressure. The procedure was tolerated well with a pain level of 2 throughout and a pain level of 1 following the procedure. Post Debridement Measurements: 0.9cm length x 1.3cm width x 0.2cm depth; 0.184cm^3 volume. Character of Wound/Ulcer Post Debridement is improved. Post procedure Diagnosis Wound #1: Same as  Pre-Procedure Wound #2 Pre-procedure diagnosis of Wound #2 is a Lymphedema located on the Left,Midline Lower Leg . There was a Skin/Subcutaneous Tissue Debridement ([id number]) debridement with total area of 5.5 sq cm performed by STONE III, [name] E., PA-C. with the following instrument(s): Curette to remove Viable and Non-Viable tissue/material including Exudate, Fibrin/Slough, and Subcutaneous after achieving pain control using Lidocaine 4% Topical Solution. A time out was conducted at 16:45, prior to the start of the procedure. A Moderate amount of bleeding was controlled with Pressure. The procedure was tolerated well with a pain level of 2 throughout and a pain level of 1 following the procedure. Post Debridement Measurements: 2.5cm length x 2.2cm width x 0.2cm depth; 0.864cm^3 volume. Character of Wound/Ulcer Post Debridement is improved. Post procedure Diagnosis Wound #2: Same as Pre-Procedure Plan Wound Cleansing: Wound #1 Left,Proximal,Medial Lower Leg: Clean wound with Normal Saline. Wound #2 Left,Midline Lower Leg: Clean wound with Normal Saline. Anesthetic (add to Medication List): Wound #1 Left,Proximal,Medial Lower Leg: Topical Lidocaine 4% cream applied to wound bed prior to debridement (In Clinic Only). Wound #2 Left,Midline Lower Leg: Topical Lidocaine 4% cream applied to wound bed prior to debridement (In Clinic Only). Primary Wound Dressing: Wound #1 Left,Proximal,Medial Lower Leg: Prisma Ag Wound #2 Left,Midline Lower Leg: Prisma Ag Secondary Dressing: Wound #1 Left,Proximal,Medial Lower Leg: Pfeifle, Izan C. ([id number]) Non-adherent pad - secured with tape Wound #2 Left,Midline  Lower Leg: Non-adherent pad - secured with tape Dressing Change Frequency: Wound #1 Left,Proximal,Medial Lower Leg: Change dressing every other day. Wound #2 Left,Midline Lower Leg: Change dressing every other day. Follow-up Appointments: Wound #1 Left,Proximal,Medial Lower Leg: Return  Appointment in 1 week. Wound #2 Left,Midline Lower Leg: Return Appointment in 1 week. Edema Control: Wound #1 Left,Proximal,Medial Lower Leg: Patient to wear own compression stockings Wound #2 Left,Midline Lower Leg: Patient to wear own compression stockings The following medication(s) was prescribed: lidocaine topical 4 % cream cream topical was prescribed at facility At this point I would recommend that we switch to [facility] for the next week to see how things will do. Patient is in agreement with the plan. Hopefully he will continue to show signs of good improvement and epithelialization with the Prisma. Please see above for specific wound care orders. We will see patient for re-evaluation in 1 week(s) here in the clinic. If anything worsens or changes patient will contact our office for additional recommendations. Electronic Signature(s) Signed: 08/01/2017 10:02:55 PM By: Worthy Keeler PA-C Entered By: Worthy Keeler on 08/01/2017 19:37:30 [name], [name] Kitchen ([id number]) -------------------------------------------------------------------------------- ROS/PFSH Details Patient Name: [name], [name] Date of Service: 07/31/2017 3:45 PM Medical Record Number: [id number] Patient Account Number: [id number] Date of Birth/Sex: 08/15/1960 (57 y.o. Male) Treating RN: [name] Primary Care Provider: [name] Other Clinician: Referring Provider: [name] Treating Provider/Extender: [name], [name] Weeks in Treatment: 6 Information Obtained From Patient Wound History Do you currently have one or more open woundso Yes How many open wounds do you currently haveo 3 Approximately how long have you had your woundso several months How have you been treating your wound(s) until nowo xeroform gauze Has your wound(s) ever healed and then re-openedo No Have you had any lab work done in the past montho No Have you tested positive for an antibiotic resistant organism (MRSA, VRE)o  No Have you tested positive for osteomyelitis (bone infection)o No Have you had any tests for circulation on your legso No Constitutional Symptoms (General Health) Complaints and Symptoms: Negative for: Fever; Chills Cardiovascular Complaints and Symptoms: Positive for: LE edema Medical History: Negative for: Arrhythmia; Congestive Heart Failure; Coronary Artery Disease; Deep Vein Thrombosis; Hypertension; Hypotension; Myocardial Infarction; Peripheral Arterial Disease; Peripheral Venous Disease; Phlebitis; Vasculitis Eyes Medical History: Negative for: Cataracts; Glaucoma; Optic Neuritis Hematologic/Lymphatic Medical History: Negative for: Anemia; Hemophilia; Human Immunodeficiency Virus; Lymphedema; Sickle Cell Disease Respiratory Complaints and Symptoms: No Complaints or Symptoms Medical History: Positive for: Sleep Apnea Negative for: Aspiration; Asthma; Chronic Obstructive Pulmonary Disease (COPD); Pneumothorax; Tuberculosis Gastrointestinal Quizhpi, Kelley C. ([id number]) Medical History: Negative for: Cirrhosis ; Colitis; Crohnos; Hepatitis A; Hepatitis B; Hepatitis C Endocrine Medical History: Negative for: Type I Diabetes; Type II Diabetes Genitourinary Medical History: Negative for: End Stage Renal Disease Immunological Medical History: Negative for: Lupus Erythematosus; Raynaudos; Scleroderma Integumentary (Skin) Medical History: Negative for: History of Burn Musculoskeletal Medical History: Negative for: Gout; Rheumatoid Arthritis; Osteoarthritis; Osteomyelitis Neurologic Medical History: Negative for: Dementia; Neuropathy; Paraplegia; Seizure Disorder Psychiatric Complaints and Symptoms: No Complaints or Symptoms Medical History: Positive for: Confinement Anxiety Negative for: Anorexia/bulimia Immunizations Pneumococcal Vaccine: Received Pneumococcal Vaccination: No Implantable Devices Family and Social History Cancer: Yes - Father; Diabetes: No; Heart  Disease: Yes - Father,Siblings; Hereditary Spherocytosis: No; Hypertension: No; Kidney Disease: No; Lung Disease: No; Seizures: No; Stroke: No; Thyroid Problems: No; Tuberculosis: No; Current some day smoker; Marital Status - Separated; Alcohol Use: Rarely; Drug Use: No History; Caffeine Use: Daily; Financial Concerns: No;  Food, Games developer or Shelter Needs: No; Support System Lacking: No; Transportation Concerns: No; Advanced Directives: No; Patient does not want information on Advanced Directives; Do not resuscitate: No; Living Will: No; Medical Power of Attorney: No Physician Affirmation I have reviewed and agree with the above information. ATTILA, MCCARTHY ([id number]) Electronic Signature(s) Signed: 08/01/2017 10:02:55 PM By: Worthy Keeler PA-C Signed: 08/03/2017 10:40:22 AM By: [name] [name], RN, CWS, [name], [name] Entered By: Worthy Keeler on 07/31/2017 17:14:15 [place], Jacquees [name] Kitchen ([id number]) -------------------------------------------------------------------------------- SuperBill Details Patient Name: Shepard, Ashvin C. Date of Service: 07/31/2017 Medical Record Number: [id number] Patient Account Number: [id number] Date of Birth/Sex: August 18, 1960 (57 y.o. Male) Treating RN: [name] Primary Care Provider: [name] Other Clinician: Referring Provider: [name] Treating Provider/Extender: [name], [name] Weeks in Treatment: 6 Diagnosis Coding ICD-10 Codes Code Description I89.0 Lymphedema, not elsewhere classified L97.822 Non-pressure chronic ulcer of other part of left lower leg with fat layer exposed E66.09 Other obesity due to excess calories Facility Procedures CPT4 Code Description: [id number] 99212 - WOUND CARE VISIT-LEV 2 EST PT Modifier: Quantity: 1 CPT4 Code Description: 88916945 11042 - DEB SUBQ TISSUE 20 SQ CM/< ICD-10 Diagnosis Description L97.822 Non-pressure chronic ulcer of other part of left lower leg with Modifier: fat layer expos Quantity: 1 ed Physician  Procedures CPT4 Code Description: 0388828 00349 - WC PHYS SUBQ TISS 20 SQ CM ICD-10 Diagnosis Description L97.822 Non-pressure chronic ulcer of other part of left lower leg with Modifier: fat layer expos Quantity: 1 ed Electronic Signature(s) Signed: 08/01/2017 10:02:55 PM By: Worthy Keeler PA-C Entered By: Worthy Keeler on 08/01/2017 19:38:07

## 2017-08-06 ENCOUNTER — Ambulatory Visit: Payer: Self-pay

## 2017-08-06 NOTE — Telephone Encounter (Signed)
Pt. Chooses to try home remedies.Instructed to call back if he is not feeling better in 5-7 days. Reason for Disposition . [1] Probable influenza (fever) with no complications AND [0] NOT HIGH RISK  Answer Assessment - Initial Assessment Questions 1. WORST SYMPTOM: "What is your worst symptom?" (e.g., cough, runny nose, muscle aches, headache, sore throat, fever)      Muscle aches, fatigue 2. ONSET: "When did your flu symptoms start?"      Yesterday 3. COUGH: "How bad is the cough?"       Cough 4. RESPIRATORY DISTRESS: "Describe your breathing."      No distress 5. FEVER: "Do you have a fever?" If so, ask: "What is your temperature, how was it measured, and when did it start?"     Yes 6. EXPOSURE: "Were you exposed to someone with influenza?"       Unsure 7. FLU VACCINE: "Did you get a flu shot this year?"     Yes 8. HIGH RISK DISEASE: "Do you any chronic medical problems?" (e.g., heart or lung disease, asthma, weak immune system, or other HIGH RISK conditions)     No 9. PREGNANCY: "Is there any chance you are pregnant?" "When was your last menstrual period?"     No 10. OTHER SYMPTOMS: "Do you have any other symptoms?"  (e.g., runny nose, muscle aches, headache, sore throat)       Muscle aches and fatigue  Protocols used: INFLUENZA - SEASONAL-A-AH

## 2017-08-07 ENCOUNTER — Encounter: Payer: 59 | Admitting: Physician Assistant

## 2017-08-11 ENCOUNTER — Encounter: Payer: Self-pay | Admitting: Internal Medicine

## 2017-08-11 ENCOUNTER — Inpatient Hospital Stay
Admission: AD | Admit: 2017-08-11 | Discharge: 2017-08-16 | DRG: 871 | Disposition: A | Discharge status: Home / Self Care | Payer: 59 | Source: Ambulatory Visit | Attending: Internal Medicine | Admitting: Internal Medicine

## 2017-08-11 ENCOUNTER — Ambulatory Visit: Payer: 59 | Admitting: Internal Medicine

## 2017-08-11 ENCOUNTER — Inpatient Hospital Stay: Payer: 59

## 2017-08-11 VITALS — BP 134/78 | HR 102 | Temp 98.9°F | Ht 75.0 in | Wt 253.8 lb

## 2017-08-11 DIAGNOSIS — J441 Chronic obstructive pulmonary disease with (acute) exacerbation: Secondary | ICD-10-CM

## 2017-08-11 DIAGNOSIS — M5136 Other intervertebral disc degeneration, lumbar region: Secondary | ICD-10-CM | POA: Diagnosis present

## 2017-08-11 DIAGNOSIS — K5730 Diverticulosis of large intestine without perforation or abscess without bleeding: Secondary | ICD-10-CM | POA: Diagnosis present

## 2017-08-11 DIAGNOSIS — Z85118 Personal history of other malignant neoplasm of bronchus and lung: Secondary | ICD-10-CM

## 2017-08-11 DIAGNOSIS — Z7951 Long term (current) use of inhaled steroids: Secondary | ICD-10-CM | POA: Diagnosis not present

## 2017-08-11 DIAGNOSIS — R591 Generalized enlarged lymph nodes: Secondary | ICD-10-CM | POA: Diagnosis present

## 2017-08-11 DIAGNOSIS — Z96653 Presence of artificial knee joint, bilateral: Secondary | ICD-10-CM | POA: Diagnosis present

## 2017-08-11 DIAGNOSIS — Z9884 Bariatric surgery status: Secondary | ICD-10-CM

## 2017-08-11 DIAGNOSIS — J189 Pneumonia, unspecified organism: Secondary | ICD-10-CM | POA: Diagnosis not present

## 2017-08-11 DIAGNOSIS — J449 Chronic obstructive pulmonary disease, unspecified: Secondary | ICD-10-CM

## 2017-08-11 DIAGNOSIS — G4733 Obstructive sleep apnea (adult) (pediatric): Secondary | ICD-10-CM | POA: Diagnosis present

## 2017-08-11 DIAGNOSIS — D3501 Benign neoplasm of right adrenal gland: Secondary | ICD-10-CM | POA: Diagnosis present

## 2017-08-11 DIAGNOSIS — Z79899 Other long term (current) drug therapy: Secondary | ICD-10-CM

## 2017-08-11 DIAGNOSIS — F329 Major depressive disorder, single episode, unspecified: Secondary | ICD-10-CM | POA: Diagnosis present

## 2017-08-11 DIAGNOSIS — F17210 Nicotine dependence, cigarettes, uncomplicated: Secondary | ICD-10-CM | POA: Diagnosis present

## 2017-08-11 DIAGNOSIS — J9601 Acute respiratory failure with hypoxia: Secondary | ICD-10-CM | POA: Diagnosis not present

## 2017-08-11 DIAGNOSIS — E860 Dehydration: Secondary | ICD-10-CM | POA: Diagnosis present

## 2017-08-11 DIAGNOSIS — N400 Enlarged prostate without lower urinary tract symptoms: Secondary | ICD-10-CM | POA: Diagnosis present

## 2017-08-11 DIAGNOSIS — R0600 Dyspnea, unspecified: Secondary | ICD-10-CM

## 2017-08-11 DIAGNOSIS — R942 Abnormal results of pulmonary function studies: Secondary | ICD-10-CM | POA: Diagnosis present

## 2017-08-11 DIAGNOSIS — L97319 Non-pressure chronic ulcer of right ankle with unspecified severity: Secondary | ICD-10-CM | POA: Diagnosis present

## 2017-08-11 DIAGNOSIS — L03116 Cellulitis of left lower limb: Secondary | ICD-10-CM

## 2017-08-11 DIAGNOSIS — R9389 Abnormal findings on diagnostic imaging of other specified body structures: Secondary | ICD-10-CM

## 2017-08-11 DIAGNOSIS — A419 Sepsis, unspecified organism: Principal | ICD-10-CM

## 2017-08-11 DIAGNOSIS — Z882 Allergy status to sulfonamides status: Secondary | ICD-10-CM | POA: Diagnosis not present

## 2017-08-11 DIAGNOSIS — R0902 Hypoxemia: Secondary | ICD-10-CM

## 2017-08-11 DIAGNOSIS — J181 Lobar pneumonia, unspecified organism: Secondary | ICD-10-CM | POA: Diagnosis present

## 2017-08-11 DIAGNOSIS — M1990 Unspecified osteoarthritis, unspecified site: Secondary | ICD-10-CM | POA: Diagnosis present

## 2017-08-11 DIAGNOSIS — R0602 Shortness of breath: Secondary | ICD-10-CM | POA: Diagnosis not present

## 2017-08-11 DIAGNOSIS — F419 Anxiety disorder, unspecified: Secondary | ICD-10-CM | POA: Diagnosis present

## 2017-08-11 LAB — CBC WITH DIFFERENTIAL/PLATELET
Basophils Absolute: 0.1 10*3/uL (ref 0–0.1)
Basophils Relative: 0 %
Eosinophils Absolute: 0.1 10*3/uL (ref 0–0.7)
Eosinophils Relative: 1 %
HCT: 37.1 % — ABNORMAL LOW (ref 40.0–52.0)
HEMOGLOBIN: 12.1 g/dL — AB (ref 13.0–18.0)
LYMPHS ABS: 0.5 10*3/uL — AB (ref 1.0–3.6)
LYMPHS PCT: 3 %
MCH: 29.6 pg (ref 26.0–34.0)
MCHC: 32.6 g/dL (ref 32.0–36.0)
MCV: 90.8 fL (ref 80.0–100.0)
Monocytes Absolute: 0.6 10*3/uL (ref 0.2–1.0)
Monocytes Relative: 3 %
NEUTROS ABS: 15.4 10*3/uL — AB (ref 1.4–6.5)
NEUTROS PCT: 93 %
PLATELETS: 252 10*3/uL (ref 150–440)
RBC: 4.09 MIL/uL — AB (ref 4.40–5.90)
RDW: 15 % — ABNORMAL HIGH (ref 11.5–14.5)
WBC: 16.6 10*3/uL — AB (ref 3.8–10.6)

## 2017-08-11 LAB — COMPREHENSIVE METABOLIC PANEL
ALT: 26 U/L (ref 17–63)
AST: 28 U/L (ref 15–41)
Albumin: 2.7 g/dL — ABNORMAL LOW (ref 3.5–5.0)
Alkaline Phosphatase: 99 U/L (ref 38–126)
Anion gap: 9 (ref 5–15)
BUN: 16 mg/dL (ref 6–20)
CALCIUM: 8.4 mg/dL — AB (ref 8.9–10.3)
CHLORIDE: 99 mmol/L — AB (ref 101–111)
CO2: 26 mmol/L (ref 22–32)
CREATININE: 0.91 mg/dL (ref 0.61–1.24)
Glucose, Bld: 208 mg/dL — ABNORMAL HIGH (ref 65–99)
Potassium: 4.7 mmol/L (ref 3.5–5.1)
Sodium: 134 mmol/L — ABNORMAL LOW (ref 135–145)
Total Bilirubin: 0.7 mg/dL (ref 0.3–1.2)
Total Protein: 7 g/dL (ref 6.5–8.1)

## 2017-08-11 LAB — POC INFLUENZA A&B (BINAX/QUICKVUE)
Influenza A, POC: NEGATIVE
Influenza B, POC: NEGATIVE

## 2017-08-11 LAB — LACTIC ACID, PLASMA: LACTIC ACID, VENOUS: 2.4 mmol/L — AB (ref 0.5–1.9)

## 2017-08-11 MED ORDER — SODIUM CHLORIDE 0.9 % IV SOLN
500.0000 mg | INTRAVENOUS | Status: DC
  Administered 2017-08-11: 500 mg via INTRAVENOUS
  Filled 2017-08-11: qty 500

## 2017-08-11 MED ORDER — SODIUM CHLORIDE 0.9 % IV SOLN
1.0000 g | Freq: Once | INTRAVENOUS | Status: AC
  Administered 2017-08-11: 1 g via INTRAVENOUS
  Filled 2017-08-11: qty 1

## 2017-08-11 MED ORDER — SODIUM CHLORIDE 0.9 % IV BOLUS (SEPSIS)
1000.0000 mL | Freq: Once | INTRAVENOUS | Status: AC
  Administered 2017-08-11: 1000 mL via INTRAVENOUS

## 2017-08-11 MED ORDER — IOPAMIDOL (ISOVUE-300) INJECTION 61%
75.0000 mL | Freq: Once | INTRAVENOUS | Status: AC | PRN
  Administered 2017-08-11: 75 mL via INTRAVENOUS

## 2017-08-11 MED ORDER — SODIUM CHLORIDE 0.9 % IV SOLN
1.0000 g | Freq: Three times a day (TID) | INTRAVENOUS | Status: DC
  Administered 2017-08-12 (×2): 1 g via INTRAVENOUS
  Filled 2017-08-11 (×5): qty 1

## 2017-08-11 MED ORDER — DULOXETINE HCL 60 MG PO CPEP
120.0000 mg | ORAL_CAPSULE | ORAL | Status: DC
  Administered 2017-08-12 – 2017-08-16 (×5): 120 mg via ORAL
  Filled 2017-08-11 (×5): qty 2

## 2017-08-11 MED ORDER — SODIUM CHLORIDE 0.9 % IV SOLN: 1.0000 g | Freq: Every day | INTRAVENOUS | Status: DC

## 2017-08-11 MED ORDER — IPRATROPIUM-ALBUTEROL 0.5-2.5 (3) MG/3ML IN SOLN
3.0000 mL | Freq: Four times a day (QID) | RESPIRATORY_TRACT | Status: DC
  Administered 2017-08-11 – 2017-08-16 (×16): 3 mL via RESPIRATORY_TRACT
  Filled 2017-08-11 (×17): qty 3

## 2017-08-11 MED ORDER — BUDESONIDE-FORMOTEROL FUMARATE 160-4.5 MCG/ACT IN AERO
2.0000 | INHALATION_SPRAY | Freq: Two times a day (BID) | RESPIRATORY_TRACT | 2 refills | Status: DC
Start: 2017-08-11 — End: 2017-08-16

## 2017-08-11 MED ORDER — HYDROCODONE-ACETAMINOPHEN 5-325 MG PO TABS
1.0000 | ORAL_TABLET | ORAL | Status: DC | PRN
Start: 2017-08-11 — End: 2017-08-16
  Administered 2017-08-14: 1 via ORAL
  Administered 2017-08-14 – 2017-08-16 (×6): 2 via ORAL
  Filled 2017-08-11 (×6): qty 2
  Filled 2017-08-11: qty 1

## 2017-08-11 MED ORDER — IPRATROPIUM-ALBUTEROL 0.5-2.5 (3) MG/3ML IN SOLN
3.0000 mL | Freq: Once | RESPIRATORY_TRACT | Status: AC
  Administered 2017-08-11: 3 mL via RESPIRATORY_TRACT

## 2017-08-11 MED ORDER — CEFDINIR 300 MG PO CAPS: 300.0000 mg | ORAL_CAPSULE | Freq: Two times a day (BID) | ORAL | 0 refills | Status: DC

## 2017-08-11 MED ORDER — ALPRAZOLAM 1 MG PO TABS: 1.0000 mg | ORAL_TABLET | Freq: Three times a day (TID) | ORAL | Status: DC | PRN

## 2017-08-11 MED ORDER — SODIUM CHLORIDE 0.9 % IV SOLN
INTRAVENOUS | Status: DC
  Administered 2017-08-11: 21:00:00 via INTRAVENOUS

## 2017-08-11 MED ORDER — PREDNISONE 20 MG PO TABS
40.0000 mg | ORAL_TABLET | Freq: Every day | ORAL | 0 refills | Status: DC
Start: 2017-08-11 — End: 2017-08-11

## 2017-08-11 MED ORDER — ONDANSETRON HCL 4 MG/2ML IJ SOLN
4.0000 mg | Freq: Four times a day (QID) | INTRAMUSCULAR | Status: DC | PRN
Start: 2017-08-11 — End: 2017-08-16

## 2017-08-11 MED ORDER — ACETAMINOPHEN 325 MG PO TABS
650.0000 mg | ORAL_TABLET | Freq: Four times a day (QID) | ORAL | Status: DC | PRN
  Administered 2017-08-11 – 2017-08-14 (×7): 650 mg via ORAL
  Filled 2017-08-11 (×8): qty 2

## 2017-08-11 MED ORDER — ACETAMINOPHEN 650 MG RE SUPP: 650.0000 mg | Freq: Four times a day (QID) | RECTAL | Status: DC | PRN

## 2017-08-11 MED ORDER — SENNOSIDES-DOCUSATE SODIUM 8.6-50 MG PO TABS: 1.0000 | ORAL_TABLET | Freq: Every evening | ORAL | Status: DC | PRN

## 2017-08-11 MED ORDER — VANCOMYCIN HCL IN DEXTROSE 1-5 GM/200ML-% IV SOLN
1000.0000 mg | Freq: Once | INTRAVENOUS | Status: AC
  Administered 2017-08-12: 1000 mg via INTRAVENOUS
  Filled 2017-08-11: qty 200

## 2017-08-11 MED ORDER — ONDANSETRON HCL 4 MG PO TABS: 4.0000 mg | ORAL_TABLET | Freq: Four times a day (QID) | ORAL | Status: DC | PRN

## 2017-08-11 MED ORDER — ENOXAPARIN SODIUM 40 MG/0.4ML ~~LOC~~ SOLN
40.0000 mg | SUBCUTANEOUS | Status: DC
  Administered 2017-08-11 – 2017-08-14 (×4): 40 mg via SUBCUTANEOUS
  Filled 2017-08-11 (×4): qty 0.4

## 2017-08-11 MED ORDER — DICLOFENAC SODIUM 75 MG PO TBEC
75.0000 mg | DELAYED_RELEASE_TABLET | Freq: Two times a day (BID) | ORAL | Status: DC
  Administered 2017-08-12 (×2): 75 mg via ORAL
  Filled 2017-08-11 (×4): qty 1

## 2017-08-11 NOTE — Progress Notes (Signed)
Pharmacy Antibiotic Note  [name] is a 57 y.o. male admitted on 08/11/2017 with pneumonia.  Pharmacy has been consulted for ceftriaxone dosing. Patient is also ordered azithromycin.   Plan: Ceftriaxone 1 g iv q 24 hours.      Temp (24hrs), Avg:100.1 F (37.8 C), Min:98.9 F (37.2 C), Max:101.2 F (38.4 C)  No results for input(s): WBC, CREATININE, LATICACIDVEN, VANCOTROUGH, VANCOPEAK, VANCORANDOM, GENTTROUGH, GENTPEAK, GENTRANDOM, TOBRATROUGH, TOBRAPEAK, TOBRARND, AMIKACINPEAK, AMIKACINTROU, AMIKACIN in the last 168 hours.  CrCl cannot be calculated (Patient's most recent lab result is older than the maximum 21 days allowed.).    Allergies  Allergen Reactions  . Sulfa Antibiotics Itching and Rash    More severe reaction 3 years ago, caused pain.  Had taken it prior and not as bad.    Antimicrobials this admission: CTX 3/12 >>  azithromycin 3/12 >>   Dose adjustments this admission:   Microbiology results: AFB pending   Thank you for allowing pharmacy to be a part of this patient's care.  Napoleon Form 08/11/2017 7:48 PM
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
## 2017-08-11 NOTE — Progress Notes (Signed)
Pre visit review using our clinic review tool, if applicable. No additional management support is needed unless otherwise documented below in the visit note. 

## 2017-08-11 NOTE — Patient Instructions (Signed)
F/u Thursday at 9:30 am   Community-Acquired Pneumonia, Adult Pneumonia is an infection of the lungs. One type of pneumonia can happen while a person is in a hospital. A different type can happen when a person is not in a hospital (community-acquired pneumonia). It is easy for this kind to spread from person to person. It can spread to you if you breathe near an infected person who coughs or sneezes. Some symptoms include:  A dry cough.  A wet (productive) cough.  Fever.  Sweating.  Chest pain.  Follow these instructions at home:  Take over-the-counter and prescription medicines only as told by your doctor. ? Only take cough medicine if you are losing sleep. ? If you were prescribed an antibiotic medicine, take it as told by your doctor. Do not stop taking the antibiotic even if you start to feel better.  Sleep with your head and neck raised (elevated). You can do this by putting a few pillows under your head, or you can sleep in a recliner.  Do not use tobacco products. These include cigarettes, chewing tobacco, and e-cigarettes. If you need help quitting, ask your doctor.  Drink enough water to keep your pee (urine) clear or pale yellow. A shot (vaccine) can help prevent pneumonia. Shots are often suggested for:  People older than 58 years of age.  People older than 58 years of age: ? Who are having cancer treatment. ? Who have long-term (chronic) lung disease. ? Who have problems with their body's defense system (immune system).  You may also prevent pneumonia if you take these actions:  Get the flu (influenza) shot every year.  Go to the dentist as often as told.  Wash your hands often. If soap and water are not available, use hand sanitizer.  Contact a doctor if:  You have a fever.  You lose sleep because your cough medicine does not help. Get help right away if:  You are short of breath and it gets worse.  You have more chest pain.  Your sickness gets  worse. This is very serious if: ? You are an older adult. ? Your body's defense system is weak.  You cough up blood. This information is not intended to replace advice given to you by your health care provider. Make sure you discuss any questions you have with your health care provider. Document Released: 11/05/2007 Document Revised: 10/25/2015 Document Reviewed: 09/13/2014 Elsevier Interactive Patient Education  2018 [employer]  Chronic Obstructive Pulmonary Disease Chronic obstructive pulmonary disease (COPD) is a long-term (chronic) lung problem. When you have COPD, it is hard for air to get in and out of your lungs. The way your lungs work will never return to normal. Usually the condition gets worse over time. There are things you can do to keep yourself as healthy as possible. Your doctor may treat your condition with:  Medicines.  Quitting smoking, if you smoke.  Rehabilitation. This may involve a team of specialists.  Oxygen.  Exercise and changes to your diet.  Lung surgery.  Comfort measures (palliative care).  Follow these instructions at home: Medicines  Take over-the-counter and prescription medicines only as told by your doctor.  Talk to your doctor before taking any cough or allergy medicines. You may need to avoid medicines that cause your lungs to be dry. Lifestyle  If you smoke, stop. Smoking makes the problem worse. If you need help quitting, ask your doctor.  Avoid being around things that make your breathing worse. This  may include smoke, chemicals, and fumes.  Stay active, but remember to also rest.  Learn and use tips on how to relax.  Make sure you get enough sleep. Most adults need at least 7 hours a night.  Eat healthy foods. Eat smaller meals more often. Rest before meals. Controlled breathing  Learn and use tips on how to control your breathing as told by your doctor. Try: ? Breathing in (inhaling) through your nose for 1 second. Then,  pucker your lips and breath out (exhale) through your lips for 2 seconds. ? Putting one hand on your belly (abdomen). Breathe in slowly through your nose for 1 second. Your hand on your belly should move out. Pucker your lips and breathe out slowly through your lips. Your hand on your belly should move in as you breathe out. Controlled coughing  Learn and use controlled coughing to clear mucus from your lungs. The steps are: 1. Lean your head a little forward. 2. Breathe in deeply. 3. Try to hold your breath for 3 seconds. 4. Keep your mouth slightly open while coughing 2 times. 5. Spit any mucus out into a tissue. 6. Rest and do the steps again 1 or 2 times as needed. General instructions  Make sure you get all the shots (vaccines) that your doctor recommends. Ask your doctor about a flu shot and a pneumonia shot.  Use oxygen therapy and therapy to help improve your lungs (pulmonary rehabilitation) if told by your doctor. If you need home oxygen therapy, ask your doctor if you should buy a tool to measure your oxygen level (oximeter).  Make a COPD action plan with your doctor. This helps you know what to do if you feel worse than usual.  Manage any other conditions you have as told by your doctor.  Avoid going outside when it is very hot, cold, or humid.  Avoid people who have a sickness you can catch (contagious).  Keep all follow-up visits as told by your doctor. This is important. Contact a doctor if:  You cough up more mucus than usual.  There is a change in the color or thickness of the mucus.  It is harder to breathe than usual.  Your breathing is faster than usual.  You have trouble sleeping.  You need to use your medicines more often than usual.  You have trouble doing your normal activities such as getting dressed or walking around the house. Get help right away if:  You have shortness of breath while resting.  You have shortness of breath that stops you  from: ? Being able to talk. ? Doing normal activities.  Your chest hurts for longer than 5 minutes.  Your skin color is more blue than usual.  Your pulse oximeter shows that you have low oxygen for longer than 5 minutes.  You have a fever.  You feel too tired to breathe normally. Summary  Chronic obstructive pulmonary disease (COPD) is a long-term lung problem.  The way your lungs work will never return to normal. Usually the condition gets worse over time. There are things you can do to keep yourself as healthy as possible.  Take over-the-counter and prescription medicines only as told by your doctor.  If you smoke, stop. Smoking makes the problem worse. This information is not intended to replace advice given to you by your health care provider. Make sure you discuss any questions you have with your health care provider. Document Released: 11/05/2007 Document Revised: 10/25/2015 Document Reviewed: 01/13/2013  Elsevier Interactive Patient Education  2017 [employer] Inc.  

## 2017-08-12 ENCOUNTER — Other Ambulatory Visit: Payer: Self-pay

## 2017-08-12 DIAGNOSIS — R0602 Shortness of breath: Secondary | ICD-10-CM

## 2017-08-12 DIAGNOSIS — J189 Pneumonia, unspecified organism: Secondary | ICD-10-CM

## 2017-08-12 LAB — BASIC METABOLIC PANEL
Anion gap: 8 (ref 5–15)
BUN: 13 mg/dL (ref 6–20)
CHLORIDE: 103 mmol/L (ref 101–111)
CO2: 27 mmol/L (ref 22–32)
CREATININE: 0.81 mg/dL (ref 0.61–1.24)
Calcium: 8.1 mg/dL — ABNORMAL LOW (ref 8.9–10.3)
GFR calc Af Amer: >60 mL/min (ref >60)
GFR calc non Af Amer: >60 mL/min (ref >60)
Glucose, Bld: 129 mg/dL — ABNORMAL HIGH (ref 65–99)
Potassium: 3.6 mmol/L (ref 3.5–5.1)
SODIUM: 138 mmol/L (ref 135–145)

## 2017-08-12 LAB — URINALYSIS, COMPLETE (UACMP) WITH MICROSCOPIC
BILIRUBIN URINE: NEGATIVE
Bacteria, UA: NONE SEEN
GLUCOSE, UA: NEGATIVE mg/dL
Hgb urine dipstick: NEGATIVE
KETONES UR: NEGATIVE mg/dL
LEUKOCYTES UA: NEGATIVE
NITRITE: NEGATIVE
PH: 6 (ref 5.0–8.0)
Protein, ur: NEGATIVE mg/dL
Specific Gravity, Urine: 1.012 (ref 1.005–1.030)

## 2017-08-12 LAB — CBC
HCT: 31.7 % — ABNORMAL LOW (ref 40.0–52.0)
HEMOGLOBIN: 10.6 g/dL — AB (ref 13.0–18.0)
MCH: 30.4 pg (ref 26.0–34.0)
MCHC: 33.5 g/dL (ref 32.0–36.0)
MCV: 90.7 fL (ref 80.0–100.0)
Platelets: 235 10*3/uL (ref 150–440)
RBC: 3.5 MIL/uL — ABNORMAL LOW (ref 4.40–5.90)
RDW: 14.8 % — ABNORMAL HIGH (ref 11.5–14.5)
WBC: 14.5 10*3/uL — ABNORMAL HIGH (ref 3.8–10.6)

## 2017-08-12 LAB — STREP PNEUMONIAE URINARY ANTIGEN: STREP PNEUMO URINARY ANTIGEN: NEGATIVE

## 2017-08-12 LAB — EXPECTORATED SPUTUM ASSESSMENT W GRAM STAIN, RFLX TO RESP C: Special Requests: NORMAL

## 2017-08-12 LAB — LACTIC ACID, PLASMA: Lactic Acid, Venous: 2.1 mmol/L (ref 0.5–1.9)

## 2017-08-12 LAB — MRSA PCR SCREENING: MRSA by PCR: NEGATIVE

## 2017-08-12 LAB — EXPECTORATED SPUTUM ASSESSMENT W REFEX TO RESP CULTURE

## 2017-08-12 MED ORDER — AZITHROMYCIN 250 MG PO TABS
250.0000 mg | ORAL_TABLET | Freq: Every day | ORAL | Status: DC
  Administered 2017-08-12 – 2017-08-13 (×2): 250 mg via ORAL
  Filled 2017-08-12 (×2): qty 1

## 2017-08-12 MED ORDER — GUAIFENESIN-DM 100-10 MG/5ML PO SYRP
5.0000 mL | ORAL_SOLUTION | ORAL | Status: DC | PRN
  Administered 2017-08-12 – 2017-08-14 (×4): 5 mL via ORAL
  Filled 2017-08-12 (×4): qty 5

## 2017-08-12 MED ORDER — BUDESONIDE 0.5 MG/2ML IN SUSP
0.5000 mg | Freq: Two times a day (BID) | RESPIRATORY_TRACT | Status: DC
  Administered 2017-08-12 – 2017-08-16 (×7): 0.5 mg via RESPIRATORY_TRACT
  Filled 2017-08-12 (×8): qty 2

## 2017-08-12 MED ORDER — VANCOMYCIN HCL IN DEXTROSE 1-5 GM/200ML-% IV SOLN
1000.0000 mg | Freq: Three times a day (TID) | INTRAVENOUS | Status: DC
  Administered 2017-08-12 (×2): 1000 mg via INTRAVENOUS
  Filled 2017-08-12 (×5): qty 200

## 2017-08-12 MED ORDER — PIPERACILLIN-TAZOBACTAM 3.375 G IVPB
3.3750 g | Freq: Three times a day (TID) | INTRAVENOUS | Status: DC
  Administered 2017-08-12 – 2017-08-16 (×11): 3.375 g via INTRAVENOUS
  Filled 2017-08-12 (×11): qty 50

## 2017-08-12 MED ORDER — AZITHROMYCIN 250 MG PO TABS: 250.0000 mg | ORAL_TABLET | Freq: Every day | ORAL | Status: DC

## 2017-08-12 MED ORDER — QUETIAPINE FUMARATE 300 MG PO TABS
600.0000 mg | ORAL_TABLET | Freq: Every day | ORAL | Status: DC
  Administered 2017-08-12 – 2017-08-15 (×4): 600 mg via ORAL
  Filled 2017-08-12: qty 2
  Filled 2017-08-12: qty 1
  Filled 2017-08-12 (×3): qty 2

## 2017-08-12 NOTE — Progress Notes (Signed)
[facility] NAME: [name]    MR#:  [id number]  DATE OF BIRTH:  November 28, 1960  SUBJECTIVE:   Patient here due to shortness of breath and cough progressively getting worse over the past week and noted to have CT chest findings suggestive of atypical infection/suspected tuberculosis. Patient still complains of a cough that is productive with yellow sputum. No fevers, chills or hemoptysis or any night sweats or weight loss.  REVIEW OF SYSTEMS:    Review of Systems  Constitutional: Negative for chills and fever.  HENT: Negative for congestion and tinnitus.   Eyes: Negative for blurred vision and double vision.  Respiratory: Positive for cough and shortness of breath. Negative for wheezing.   Cardiovascular: Negative for chest pain, orthopnea and PND.  Gastrointestinal: Negative for abdominal pain, diarrhea, nausea and vomiting.  Genitourinary: Negative for dysuria and hematuria.  Neurological: Negative for dizziness, sensory change and focal weakness.  All other systems reviewed and are negative.   Nutrition: Regular Tolerating Diet: Yes Tolerating PT: Await Eval.   DRUG ALLERGIES:   Allergies  Allergen Reactions  . Sulfa Antibiotics Itching and Rash    More severe reaction 3 years ago, caused pain.  Had taken it prior and not as bad.    VITALS:  Blood pressure 119/64, pulse 85, temperature 98.9 F (37.2 C), temperature source Oral, resp. rate 19, height 6\' 3"  (1.905 m), weight 116.1 kg (256 lb), SpO2 95 %.  PHYSICAL EXAMINATION:   Physical Exam  GENERAL:  57 y.o.-year-old patient lying in bed in no acute distress.  EYES: Pupils equal, round, reactive to light and accommodation. No scleral icterus. Extraocular muscles intact.  HEENT: Head atraumatic, normocephalic. Oropharynx and nasopharynx clear.  NECK:  Supple, no jugular venous distention. No thyroid enlargement, no tenderness.  LUNGS: Normal breath sounds bilaterally, no  wheezing, rales, rhonchi. No use of accessory muscles of respiration.  CARDIOVASCULAR: S1, S2 normal. No murmurs, rubs, or gallops.  ABDOMEN: Soft, nontender, nondistended. Bowel sounds present. No organomegaly or mass.  EXTREMITIES: No cyanosis, clubbing or edema b/l.    NEUROLOGIC: Cranial nerves II through XII are intact. No focal Motor or sensory deficits b/l.   PSYCHIATRIC: The patient is alert and oriented x 3.  SKIN: No obvious rash, lesion, or ulcer.    LABORATORY PANEL:   CBC Recent Labs  Lab 08/12/17 0545  WBC 14.5*  HGB 10.6*  HCT 31.7*  PLT 235   ------------------------------------------------------------------------------------------------------------------  Chemistries  Recent Labs  Lab 08/11/17 2036 08/12/17 0545  NA 134* 138  K 4.7 3.6  CL 99* 103  CO2 26 27  GLUCOSE 208* 129*  BUN 16 13  CREATININE 0.91 0.81  CALCIUM 8.4* 8.1*  AST 28  --   ALT 26  --   ALKPHOS 99  --   BILITOT 0.7  --    ------------------------------------------------------------------------------------------------------------------  Cardiac Enzymes No results for input(s): TROPONINI in the last 168 hours. ------------------------------------------------------------------------------------------------------------------  RADIOLOGY:  Ct Chest W Contrast  Result Date: 08/12/2017 CLINICAL DATA:  Acute onset of cough, fever, chills and body aches. Difficulty breathing. EXAM: CT CHEST WITH CONTRAST TECHNIQUE: Multidetector CT imaging of the chest was performed during intravenous contrast administration. CONTRAST:  38mL ISOVUE-300 IOPAMIDOL (ISOVUE-300) INJECTION 61% COMPARISON:  None. FINDINGS: Cardiovascular: The heart is normal in size. Minimal calcification is noted at the aortic arch. The great vessels are unremarkable in appearance. Mediastinum/Nodes: Scattered prominent mediastinal nodes measure up to 1.2 cm in the  precarinal region. Prominent hilar, right paratracheal and  aortopulmonary window nodes are seen. No pericardial effusion is identified. A partially calcified 2.1 cm lesion is noted at the right thyroid lobe. No axillary lymphadenopathy is seen. Lungs/Pleura: There is multifocal dense airspace opacification involving much of the left lung, with multiple foci of caseation or necrosis. This is concerning for severe atypical infection. Tuberculosis cannot be excluded, though the sparing of the right lung would be unusual. There is no definite evidence of extension across the pleura. No pleural effusion or pneumothorax is seen. Upper Abdomen: The visualized portions of the liver and spleen are unremarkable. A 3.8 cm right adrenal adenoma is noted. The visualized portions of the pancreas are unremarkable. Apparent gallbladder wall thickening may reflect relative decompression. The patient is status post gastric bypass surgery. The gastrojejunal anastomosis is grossly unremarkable. Musculoskeletal: No acute osseous abnormalities are identified. Endplate sclerotic change is noted at the upper lumbar spine. The visualized musculature is unremarkable in appearance. IMPRESSION: 1. Multifocal dense airspace opacification involving much of the left lung, with multiple foci of caseation or necrosis. This is concerning for severe atypical infection. Tuberculosis cannot be excluded, though the sparing of the right lung would be unusual. 2. After completion of treatment, follow-up CT of the chest would be helpful to exclude an underlying mass. 3. Prominent mediastinal and hilar nodes likely reflect the acute infection. 4. Right adrenal adenoma noted. These results were called by telephone at the time of interpretation on 08/12/2017 at 12:21 am to [facility], Who verbally acknowledged these results. Electronically Signed   By: Garald Balding M.D.   On: 08/12/2017 00:25     ASSESSMENT AND PLAN:   57 year old male with past medical history of obstructive sleep apnea, morbid obesity  status post bariatric surgery, depression, anxiety, bipolar disorder, BPH, GERD, who presented to the hospital due to shortness of breath and noted to have CT chest findings suggestive of atypical pneumonia/tuberculosis.  1. Pneumonia-suspected to be the cause of patient's shortness of breath. Patient CT chest is suggestive of atypical infection/pneumonia. Patient is clinically afebrile and hemodynamically stable though. - cont. Vanco, Meropenem for now and will get MRSA PCR - follow cultures.   2. Abnormal CT chest-patient noted to have left-sided caseating lung masses consistent with possible tuberculosis with mediastinal and hilar lymphadenopathy. Unclear if these are acute or chronic. 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 -He does have a tobacco history, denies any history of tuberculosis. Continue airborne precautions, check AFBs 3.  - will get ID, and Pulm. Consult and await input.   3. Anxiety/depression-continue Xanax, Cymbalta.  4. Osteoarthritis - cont. Diclofenac    All the records are reviewed and case discussed with Care Management/Social Worker. Management plans discussed with the patient, family and they are in agreement.  CODE STATUS: Full code  DVT Prophylaxis: Lovenox  TOTAL TIME TAKING CARE OF THIS PATIENT: 30 minutes.   POSSIBLE D/C IN 2-3 DAYS, DEPENDING ON CLINICAL CONDITION.   Henreitta Leber M.D on 08/12/2017 at 1:33 PM  Between 7am to 6pm - Pager - [phone]  After 6pm go to [email or web address] - Proofreader  [facility]  Office  [phone]  CC: Primary care physician; McLean-Scocuzza, Nino Glow, MD
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
## 2017-08-12 NOTE — Progress Notes (Signed)
Pharmacy Antibiotic Note  [name] is a 57 y.o. male admitted on 08/11/2017 with pneumonia.  Pharmacy has been consulted for vanc/meropenem dosing.  Plan: ID consults pharmacy to switch meropenem to Zosyn. Stop meropenem and start zosyn 3.375 gm IV Q8H EI. Continue vanc and azithromycin as ordered.  Height: 6\' 3"  (190.5 cm) Weight: 256 lb (116.1 kg) IBW/kg (Calculated) : 84.5  Temp (24hrs), Avg:99.5 F (37.5 C), Min:98.9 F (37.2 C), Max:101.2 F (38.4 C)  Recent Labs  Lab 08/11/17 2036 08/11/17 2302 08/12/17 0545  WBC 16.6*  --  14.5*  CREATININE 0.91  --  0.81  LATICACIDVEN 2.4* 2.1*  --     Estimated Creatinine Clearance: 136.5 mL/min (by C-G formula based on SCr of 0.81 mg/dL).    Allergies  Allergen Reactions  . Sulfa Antibiotics Itching and Rash    More severe reaction 3 years ago, caused pain.  Had taken it prior and not as bad.    Thank you for allowing pharmacy to be a part of this patient's care.  Jerlyn Pain A. Jordan Hawks, PharmD, BCPS Clinical Pharmacist 08/12/2017
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
## 2017-08-12 NOTE — Progress Notes (Signed)
Pharmacy Antibiotic Note  [name] is a 57 y.o. male admitted on 08/11/2017 with pneumonia.  Pharmacy has been consulted for vanc/cefepime dosing.  Plan: Patient received vanc 1g and meropenem 1g IV x 1  Will continue w/ vanc 1g IV q8h w/ 6 hour stack Will draw vanc trough 03/14 @ 0500 prior to 4th dose Will continue meropenem 1g IV q8h  Ke 0.104 T1/2 8 hrs Goal trough 15 - 20 mcg/mL    Temp (24hrs), Avg:100.1 F (37.8 C), Min:98.9 F (37.2 C), Max:101.2 F (38.4 C)  Recent Labs  Lab 08/11/17 2036 08/11/17 2302  WBC 16.6*  --   CREATININE 0.91  --   LATICACIDVEN 2.4* 2.1*    Estimated Creatinine Clearance: 121 mL/min (by C-G formula based on SCr of 0.91 mg/dL).    Allergies  Allergen Reactions  . Sulfa Antibiotics Itching and Rash    More severe reaction 3 years ago, caused pain.  Had taken it prior and not as bad.    Thank you for allowing pharmacy to be a part of this patient's care.  Tobie Lords, PharmD, BCPS Clinical Pharmacist 08/12/2017

## 2017-08-13 ENCOUNTER — Ambulatory Visit: Payer: Self-pay | Admitting: Internal Medicine

## 2017-08-13 DIAGNOSIS — G4733 Obstructive sleep apnea (adult) (pediatric): Secondary | ICD-10-CM

## 2017-08-13 LAB — RESPIRATORY PANEL BY PCR
ADENOVIRUS-RVPPCR: NOT DETECTED
Bordetella pertussis: NOT DETECTED
CHLAMYDOPHILA PNEUMONIAE-RVPPCR: NOT DETECTED
CORONAVIRUS HKU1-RVPPCR: NOT DETECTED
CORONAVIRUS NL63-RVPPCR: NOT DETECTED
CORONAVIRUS OC43-RVPPCR: NOT DETECTED
Coronavirus 229E: NOT DETECTED
Influenza A: NOT DETECTED
Influenza B: NOT DETECTED
MYCOPLASMA PNEUMONIAE-RVPPCR: NOT DETECTED
Metapneumovirus: NOT DETECTED
PARAINFLUENZA VIRUS 1-RVPPCR: NOT DETECTED
PARAINFLUENZA VIRUS 3-RVPPCR: NOT DETECTED
Parainfluenza Virus 2: NOT DETECTED
Parainfluenza Virus 4: NOT DETECTED
RHINOVIRUS / ENTEROVIRUS - RVPPCR: NOT DETECTED
Respiratory Syncytial Virus: NOT DETECTED

## 2017-08-13 LAB — BASIC METABOLIC PANEL
ANION GAP: 9 (ref 5–15)
BUN: 10 mg/dL (ref 6–20)
CO2: 26 mmol/L (ref 22–32)
Calcium: 8.1 mg/dL — ABNORMAL LOW (ref 8.9–10.3)
Chloride: 104 mmol/L (ref 101–111)
Creatinine, Ser: 0.69 mg/dL (ref 0.61–1.24)
GFR calc non Af Amer: >60 mL/min (ref >60)
GLUCOSE: 134 mg/dL — AB (ref 65–99)
Potassium: 4.2 mmol/L (ref 3.5–5.1)
Sodium: 139 mmol/L (ref 135–145)

## 2017-08-13 LAB — HIV ANTIBODY (ROUTINE TESTING W REFLEX): HIV SCREEN 4TH GENERATION: NONREACTIVE

## 2017-08-13 LAB — URINE CULTURE: CULTURE: NO GROWTH

## 2017-08-13 LAB — LEGIONELLA PNEUMOPHILA SEROGP 1 UR AG: L. pneumophila Serogp 1 Ur Ag: NEGATIVE

## 2017-08-13 NOTE — Plan of Care (Signed)
  Education: Knowledge of General Education information will improve 08/13/2017 0505 - Progressing by Asher Torpey, Lucille Passy, RN   Health Behavior/Discharge Planning: Ability to manage health-related needs will improve 08/13/2017 0505 - Progressing by Mervil Wacker, Lucille Passy, RN   Clinical Measurements: Ability to maintain clinical measurements within normal limits will improve 08/13/2017 0505 - Progressing by Lashun Ramseyer, Lucille Passy, RN Will remain free from infection 08/13/2017 0505 - Progressing by Laraine Samet, Lucille Passy, RN Diagnostic test results will improve 08/13/2017 0505 - Progressing by Derris Millan, Lucille Passy, RN Respiratory complications will improve 08/13/2017 0505 - Progressing by Bryna Colander, RN Cardiovascular complication will be avoided 08/13/2017 0505 - Progressing by Marbeth Smedley, Lucille Passy, RN   Nutrition: Adequate nutrition will be maintained 08/13/2017 0505 - Progressing by Bryna Colander, RN   Activity: Risk for activity intolerance will decrease 08/13/2017 0505 - Progressing by Bryna Colander, RN   Coping: Level of anxiety will decrease 08/13/2017 0505 - Progressing by Evolette Pendell, Lucille Passy, RN

## 2017-08-13 NOTE — Progress Notes (Signed)
[facility] [place] NAME: [name]    MR#:  [id number]  DATE OF BIRTH:  1960-10-12  SUBJECTIVE:   Shortness of breath, cough improved since yesterday. AFBs are still pending. No hemoptysis, night sweats or fever overnight. Seen by infectious disease and pulmonary yesterday.  REVIEW OF SYSTEMS:    Review of Systems  Constitutional: Negative for chills and fever.  HENT: Negative for congestion and tinnitus.   Eyes: Negative for blurred vision and double vision.  Respiratory: Positive for cough and shortness of breath. Negative for wheezing.   Cardiovascular: Negative for chest pain, orthopnea and PND.  Gastrointestinal: Negative for abdominal pain, diarrhea, nausea and vomiting.  Genitourinary: Negative for dysuria and hematuria.  Neurological: Negative for dizziness, sensory change and focal weakness.  All other systems reviewed and are negative.   Nutrition: Regular Tolerating Diet: Yes Tolerating PT: Await Eval.   DRUG ALLERGIES:   Allergies  Allergen Reactions  . Sulfa Antibiotics Itching and Rash    More severe reaction 3 years ago, caused pain.  Had taken it prior and not as bad.    VITALS:  Blood pressure 134/72, pulse (!) 107, temperature 99.7 F (37.6 C), temperature source Oral, resp. rate 20, height 6\' 3"  (1.905 m), weight 116.1 kg (256 lb), SpO2 94 %.  PHYSICAL EXAMINATION:   Physical Exam  GENERAL:  57 y.o.-year-old patient lying in bed in no acute distress.  EYES: Pupils equal, round, reactive to light and accommodation. No scleral icterus. Extraocular muscles intact.  HEENT: Head atraumatic, normocephalic. Oropharynx and nasopharynx clear.  NECK:  Supple, no jugular venous distention. No thyroid enlargement, no tenderness.  LUNGS: Normal breath sounds bilaterally, no wheezing, rales, rhonchi. No use of accessory muscles of respiration.  CARDIOVASCULAR: S1, S2 normal. No murmurs, rubs, or gallops.  ABDOMEN: Soft,  nontender, nondistended. Bowel sounds present. No organomegaly or mass.  EXTREMITIES: No cyanosis, clubbing or edema b/l.    NEUROLOGIC: Cranial nerves II through XII are intact. No focal Motor or sensory deficits b/l.   PSYCHIATRIC: The patient is alert and oriented x 3.  SKIN: No obvious rash, lesion, or ulcer.    LABORATORY PANEL:   CBC Recent Labs  Lab 08/12/17 0545  WBC 14.5*  HGB 10.6*  HCT 31.7*  PLT 235   ------------------------------------------------------------------------------------------------------------------  Chemistries  Recent Labs  Lab 08/11/17 2036  08/13/17 0514  NA 134*   < > 139  K 4.7   < > 4.2  CL 99*   < > 104  CO2 26   < > 26  GLUCOSE 208*   < > 134*  BUN 16   < > 10  CREATININE 0.91   < > 0.69  CALCIUM 8.4*   < > 8.1*  AST 28  --   --   ALT 26  --   --   ALKPHOS 99  --   --   BILITOT 0.7  --   --    < > = values in this interval not displayed.   ------------------------------------------------------------------------------------------------------------------  Cardiac Enzymes No results for input(s): TROPONINI in the last 168 hours. ------------------------------------------------------------------------------------------------------------------  RADIOLOGY:  Ct Chest W Contrast  Result Date: 08/12/2017 CLINICAL DATA:  Acute onset of cough, fever, chills and body aches. Difficulty breathing. EXAM: CT CHEST WITH CONTRAST TECHNIQUE: Multidetector CT imaging of the chest was performed during intravenous contrast administration. CONTRAST:  6mL ISOVUE-300 IOPAMIDOL (ISOVUE-300) INJECTION 61% COMPARISON:  None. FINDINGS: Cardiovascular: The heart is  normal in size. Minimal calcification is noted at the aortic arch. The great vessels are unremarkable in appearance. Mediastinum/Nodes: Scattered prominent mediastinal nodes measure up to 1.2 cm in the precarinal region. Prominent hilar, right paratracheal and aortopulmonary window nodes are seen. No  pericardial effusion is identified. A partially calcified 2.1 cm lesion is noted at the right thyroid lobe. No axillary lymphadenopathy is seen. Lungs/Pleura: There is multifocal dense airspace opacification involving much of the left lung, with multiple foci of caseation or necrosis. This is concerning for severe atypical infection. Tuberculosis cannot be excluded, though the sparing of the right lung would be unusual. There is no definite evidence of extension across the pleura. No pleural effusion or pneumothorax is seen. Upper Abdomen: The visualized portions of the liver and spleen are unremarkable. A 3.8 cm right adrenal adenoma is noted. The visualized portions of the pancreas are unremarkable. Apparent gallbladder wall thickening may reflect relative decompression. The patient is status post gastric bypass surgery. The gastrojejunal anastomosis is grossly unremarkable. Musculoskeletal: No acute osseous abnormalities are identified. Endplate sclerotic change is noted at the upper lumbar spine. The visualized musculature is unremarkable in appearance. IMPRESSION: 1. Multifocal dense airspace opacification involving much of the left lung, with multiple foci of caseation or necrosis. This is concerning for severe atypical infection. Tuberculosis cannot be excluded, though the sparing of the right lung would be unusual. 2. After completion of treatment, follow-up CT of the chest would be helpful to exclude an underlying mass. 3. Prominent mediastinal and hilar nodes likely reflect the acute infection. 4. Right adrenal adenoma noted. These results were called by telephone at the time of interpretation on 08/12/2017 at 12:21 am to [facility] at [facility], Who verbally acknowledged these results. Electronically Signed   By: Garald Balding M.D.   On: 08/12/2017 00:25     ASSESSMENT AND PLAN:   57 year old male with past medical history of obstructive sleep apnea, morbid obesity status post bariatric surgery, depression,  anxiety, bipolar disorder, BPH, GERD, who presented to the hospital due to shortness of breath and noted to have CT chest findings suggestive of atypical pneumonia/tuberculosis.  1. Pneumonia-suspected to be the cause of patient's shortness of breath. Patient CT chest is suggestive of atypical infection/pneumonia. Patient is clinically afebrile and hemodynamically stable though. -Patient's MRSA PCR was negative and therefore vancomycin discontinued. Continue Zosyn, Zithromax. Respiratory panel by PCR was also negative. Appreciate infectious disease and pulmonary input.  2. Abnormal CT chest-patient noted to have left-sided caseating lung masses consistent with possible tuberculosis with mediastinal and hilar lymphadenopathy.  -Seen by pulmonary and they recommend continuing current antibiotic treatment for pneumonia. Continue to rule out tuberculosis with AFBs 3 in, QuantiFERON test has been ordered. Patient will likely need repeat CT scan in the next month or 2 after he finished treatment for his pneumonia. If not improving patient will likely need a bronchoscopy.  3. Anxiety/depression-continue Xanax, Cymbalta.  4. Osteoarthritis - cont. Diclofenac    All the records are reviewed and case discussed with Care Management/Social Worker. Management plans discussed with the patient, family and they are in agreement.  CODE STATUS: Full code  DVT Prophylaxis: Lovenox  TOTAL TIME TAKING CARE OF THIS PATIENT: 25 minutes.   POSSIBLE D/C IN 2-3 DAYS, DEPENDING ON CLINICAL CONDITION.   Henreitta Leber M.D on 08/13/2017 at 1:30 PM  Between 7am to 6pm - Pager - [phone]  After 6pm go to [email or web address] - [facility]  Office  [phone]  CC: Primary care physician; McLean-Scocuzza, Nino Glow, MD

## 2017-08-13 NOTE — Progress Notes (Signed)
* [facility]   Assessment and Plan:  Pneumonia. -Symptoms of dyspnea, fever, excess sputum production  consistent with a diagnosis of pneumonia. - Continue antibiotics per ID recommendations, agree with current rule out TB. -Given recurrence of severe pneumonia in 2 years, would consider checking serum immunoglobulins, once the patient has recovered (acute illness can affect results). -Given resolution of pneumonic infiltrates previously with treatment of steroids and antibiotics, will check ACE and ANCA level.   --Outpatient followup in 2-4 weeks, at that time will schedule follow up imaging.   Masslike consolidation. -Patient presented similarly in 2017, underwent bronchoscopy which was negative. After treatment with antibiotics and steroids the infiltrates cleared. -We will therefore treat again with IV antibiotics, can change to p.o. antibiotics upon discharge, presuming the TB has been ruled out. --TB quantiferonm ordered. -We will need repeat imaging to ensure resolution, if not the patient may require a bronchoscopy.  OSA.  --Split night study was performed on 08/04/12; AHI of 14.9; titrated to CPAP of 8.  --sat dropped to 89% on RA, patient was started on 2L [place].  --He has not been using CPAP regularly anymore at home, will start while inpatient at pressure of 8 with sleep.   As patient appears to be doing well, pulmonary service will follow peripherally, and will see again on Monday if patient is still admitted. My office will call patient to arrange follow-up in 2-4 weeks.   Date: 08/13/2017  MRN# [id number] OLLIVANDER SEE [birth year]   [name] is a 57 y.o. old male seen in follow up for chief complaint of    HPI:   Patient with mass like opacity c/w pneumonia, currently being ruled out for TB. He has no new complaints today, he feels that his breathing is still doing well, and his other symptoms have resolved. He was started on oxygen at 2L overnight due  to desat to 89%, He has a history of OSA. He was originally diagnosed remotely but lost a tremendous amount of weight after gastric bypass in 2003. He was seen by [facility] in 2014 for OSA, and was encouraged to continue to use cpap, but is now longer using it.   Medication:    Current Facility-Administered Medications:  .  acetaminophen (TYLENOL) tablet 650 mg, 650 mg, Oral, Q6H PRN, 650 mg at 08/13/17 0906 **OR** acetaminophen (TYLENOL) suppository 650 mg, 650 mg, Rectal, Q6H PRN, Pyreddy, Pavan, MD .  ALPRAZolam Duanne Moron) tablet 1 mg, 1 mg, Oral, TID PRN, Pyreddy, Pavan, MD .  azithromycin (ZITHROMAX) tablet 250 mg, 250 mg, Oral, q1800, Sainani, Vivek J, MD, 250 mg at 08/12/17 1710 .  budesonide (PULMICORT) nebulizer solution 0.5 mg, 0.5 mg, Nebulization, BID, Henreitta Leber, MD, 0.5 mg at 08/12/17 2045 .  DULoxetine (CYMBALTA) DR capsule 120 mg, 120 mg, Oral, BH-q7a, Pyreddy, Reatha Harps, MD, 120 mg at 08/13/17 0906 .  enoxaparin (LOVENOX) injection 40 mg, 40 mg, Subcutaneous, Q24H, Pyreddy, Pavan, MD, 40 mg at 08/12/17 2201 .  guaiFENesin-dextromethorphan (ROBITUSSIN DM) 100-10 MG/5ML syrup 5 mL, 5 mL, Oral, Q4H PRN, Henreitta Leber, MD, 5 mL at 08/13/17 0906 .  HYDROcodone-acetaminophen (NORCO/VICODIN) 5-325 MG per tablet 1-2 tablet, 1-2 tablet, Oral, Q4H PRN, Pyreddy, Pavan, MD .  ipratropium-albuterol (DUONEB) 0.5-2.5 (3) MG/3ML nebulizer solution 3 mL, 3 mL, Nebulization, Q6H, Pyreddy, Pavan, MD, 3 mL at 08/13/17 0204 .  ondansetron (ZOFRAN) tablet 4 mg, 4 mg, Oral, Q6H PRN **OR** ondansetron (ZOFRAN) injection 4 mg, 4 mg, Intravenous, Q6H  PRN, Saundra Shelling, MD .  piperacillin-tazobactam (ZOSYN) IVPB 3.375 g, 3.375 g, Intravenous, Q8H, Sainani, Belia Heman, MD, Last Rate: 12.5 mL/hr at 08/13/17 0547, 3.375 g at 08/13/17 0547 .  QUEtiapine (SEROQUEL) tablet 600 mg, 600 mg, Oral, QHS, Manuella Ghazi, Vipul, MD, 600 mg at 08/12/17 2356 .  senna-docusate (Senokot-S) tablet 1 tablet, 1 tablet, Oral, QHS  PRN, Pyreddy, Pavan, MD   Allergies:  Sulfa antibiotics  Review of Systems: Gen:  Denies  fever, sweats. HEENT: Denies blurred vision. Cvc:  No dizziness, chest pain or heaviness Resp:   Denies cough or sputum porduction. Gi: Denies swallowing difficulty, stomach pain. constipation, bowel incontinence Gu:  Denies bladder incontinence, burning urine Ext:   No Joint pain, stiffness. Skin: No skin rash, easy bruising. Endoc:  No polyuria, polydipsia. Psych: No depression, insomnia. Other:  All other systems were reviewed and found to be negative other than what is mentioned in the HPI.   Physical Examination:   VS: BP 134/72 (BP Location: Left Arm)   Pulse (!) 107   Temp 99.7 F (37.6 C) (Oral)   Resp 20   Ht 6\' 3"  (1.905 m)   Wt 256 lb (116.1 kg)   SpO2 94%   BMI 32.00 kg/m    General Appearance: No distress, awake, alert, in good spirits.  Neuro:without focal findings,  speech normal,  HEENT: PERRLA, EOM intact. Pulmonary: normal breath sounds, No wheezing.   CardiovascularNormal S1,S2.  No m/r/g.   Abdomen: Benign, Soft, non-tender. Renal:  No costovertebral tenderness  GU:  Not performed at this time. Endoc: No evident thyromegaly, no signs of acromegaly. Skin:   warm, no rash. Extremities: normal, no cyanosis, clubbing.   LABORATORY PANEL:   CBC Recent Labs  Lab 08/12/17 0545  WBC 14.5*  HGB 10.6*  HCT 31.7*  PLT 235   ------------------------------------------------------------------------------------------------------------------  Chemistries  Recent Labs  Lab 08/11/17 2036  08/13/17 0514  NA 134*   < > 139  K 4.7   < > 4.2  CL 99*   < > 104  CO2 26   < > 26  GLUCOSE 208*   < > 134*  BUN 16   < > 10  CREATININE 0.91   < > 0.69  CALCIUM 8.4*   < > 8.1*  AST 28  --   --   ALT 26  --   --   ALKPHOS 99  --   --   BILITOT 0.7  --   --    < > = values in this interval not displayed.    ------------------------------------------------------------------------------------------------------------------  Cardiac Enzymes No results for input(s): TROPONINI in the last 168 hours. ------------------------------------------------------------  RADIOLOGY:   No results found for this or any previous visit. Results for orders placed in visit on 07/07/17  DG Chest 2 View   Narrative CLINICAL DATA:  Shortness of breath.  Reported recent pneumonia  EXAM: CHEST  2 VIEW  COMPARISON:  January 24, 2017  FINDINGS: There is scarring in the right mid lung region, stable. There is no appreciable edema or consolidation. Heart size and pulmonary vascularity are normal. No adenopathy. No bone lesions. There are areas of mild eventration along each hemidiaphragm, stable.  IMPRESSION: Scarring right mid lung region anteriorly. No edema or consolidation. Stable cardiac silhouette.   Electronically Signed   By: Lowella Grip III M.D.   On: 07/08/2017 08:54    ------------------------------------------------------------------------------------------------------------------  Thank  you for allowing [facility] to assist in the  care of your patient. Our recommendations are noted above.  Please contact us if we can be of further service.   Marda Stalker, MD.  [facility] Office Number: [phone]  Patricia Pesa, M.D.  Merton Border, M.D  08/13/2017

## 2017-08-14 ENCOUNTER — Encounter: Payer: 59 | Admitting: Physician Assistant

## 2017-08-14 LAB — EXPECTORATED SPUTUM ASSESSMENT W REFEX TO RESP CULTURE

## 2017-08-14 LAB — ANGIOTENSIN CONVERTING ENZYME: Angiotensin-Converting Enzyme: 25 U/L (ref 14–82)

## 2017-08-14 LAB — MPO/PR-3 (ANCA) ANTIBODIES
ANCA Proteinase 3: <3.5 U/mL (ref 0.0–3.5)
Myeloperoxidase Abs: <9 U/mL (ref 0.0–9.0)

## 2017-08-14 LAB — EXPECTORATED SPUTUM ASSESSMENT W GRAM STAIN, RFLX TO RESP C: Special Requests: NORMAL

## 2017-08-14 MED ORDER — AZITHROMYCIN 250 MG PO TABS
250.0000 mg | ORAL_TABLET | Freq: Every day | ORAL | Status: AC
  Administered 2017-08-14 – 2017-08-15 (×2): 250 mg via ORAL
  Filled 2017-08-14 (×2): qty 1

## 2017-08-14 NOTE — Progress Notes (Signed)
[facility] PROGRESS NOTE Date of Admission:  08/11/2017     ID: [name] is a 57 y.o. male with PNA Active Problems:   Dyspnea   Pneumonia   Sepsis ([place])   Subjective: Feels a little better not still with deep productive cough.   ROS  Eleven systems are reviewed and negative except per hpi  Medications:  Antibiotics Given (last 72 hours)    Date/Time Action Medication Dose Rate   08/11/17 2257 New Bag/Given   azithromycin (ZITHROMAX) 500 mg in sodium chloride 0.9 % 250 mL IVPB 500 mg 250 mL/hr   08/11/17 2257 New Bag/Given   meropenem (MERREM) 1 g in sodium chloride 0.9 % 100 mL IVPB 1 g 200 mL/hr   08/12/17 0019 New Bag/Given   vancomycin (VANCOCIN) IVPB 1000 mg/200 mL premix 1,000 mg 200 mL/hr   08/12/17 0536 New Bag/Given   vancomycin (VANCOCIN) IVPB 1000 mg/200 mL premix 1,000 mg 200 mL/hr   08/12/17 0759 New Bag/Given   meropenem (MERREM) 1 g in sodium chloride 0.9 % 100 mL IVPB 1 g 200 mL/hr   08/12/17 1308 New Bag/Given   meropenem (MERREM) 1 g in sodium chloride 0.9 % 100 mL IVPB 1 g 200 mL/hr   08/12/17 1414 New Bag/Given   vancomycin (VANCOCIN) IVPB 1000 mg/200 mL premix 1,000 mg 200 mL/hr   08/12/17 1710 Given   azithromycin (ZITHROMAX) tablet 250 mg 250 mg    08/12/17 2201 New Bag/Given   piperacillin-tazobactam (ZOSYN) IVPB 3.375 g 3.375 g 12.5 mL/hr   08/13/17 0547 New Bag/Given   piperacillin-tazobactam (ZOSYN) IVPB 3.375 g 3.375 g 12.5 mL/hr   08/13/17 1309 New Bag/Given   piperacillin-tazobactam (ZOSYN) IVPB 3.375 g 3.375 g 12.5 mL/hr   08/13/17 1808 Given   azithromycin (ZITHROMAX) tablet 250 mg 250 mg    08/13/17 2129 New Bag/Given   piperacillin-tazobactam (ZOSYN) IVPB 3.375 g 3.375 g 12.5 mL/hr   08/14/17 7858 New Bag/Given   piperacillin-tazobactam (ZOSYN) IVPB 3.375 g 3.375 g 12.5 mL/hr   08/14/17 1357 New Bag/Given   piperacillin-tazobactam (ZOSYN) IVPB 3.375 g 3.375 g 12.5 mL/hr     . azithromycin  250 mg Oral q1800   . budesonide (PULMICORT) nebulizer solution  0.5 mg Nebulization BID  . DULoxetine  120 mg Oral BH-q7a  . enoxaparin (LOVENOX) injection  40 mg Subcutaneous Q24H  . ipratropium-albuterol  3 mL Nebulization Q6H  . QUEtiapine  600 mg Oral QHS    Objective: Vital signs in last 24 hours: Temp:  [98.5 F (36.9 C)] 98.5 F (36.9 C) (03/15 0410) Pulse Rate:  [93-100] 100 (03/15 0410) Resp:  [18-19] 18 (03/15 0410) BP: (111-121)/(61-73) 111/61 (03/15 0410) SpO2:  [91 %-98 %] 95 % (03/15 1347) Constitutional: He is oriented to person, place, and time. Obese, NAD. Speaking in full sentences HENT: anicteric Mouth/Throat: Oropharynx is clear and moist. No oropharyngeal exudate.  Cardiovascular: Normal rate, regular rhythm and normal heart sounds. Pulmonary/Chest: decreased BS on L, rhonchi on L  Abdominal: Soft. Bowel sounds are normal. He exhibits no distension. There is no tenderness.  Lymphadenopathy: He has no cervical adenopathy.  Neurological: He is alert and oriented to person, place, and time.  Skin: R ankle with wound Psychiatric: He has a normal mood and affect. His behavior is normal.      Lab Results Recent Labs    08/11/17 2036 08/12/17 0545 08/13/17 0514  WBC 16.6* 14.5*  --   HGB 12.1* 10.6*  --  HCT 37.1* 31.7*  --   NA 134* 138 139  K 4.7 3.6 4.2  CL 99* 103 104  CO2 26 27 26   BUN 16 13 10   CREATININE 0.91 0.81 0.69    Microbiology: Results for orders placed or performed during the hospital encounter of 08/11/17  CULTURE, BLOOD (ROUTINE X 2) w Reflex to ID Panel     Status: None (Preliminary result)   Collection Time: 08/11/17  9:18 PM  Result Value Ref Range Status   Specimen Description BLOOD LEFT ANTECUBITAL  Final   Special Requests   Final    BOTTLES DRAWN AEROBIC AND ANAEROBIC Blood Culture adequate volume   Culture   Final    NO GROWTH 3 DAYS Performed at [facility], [address] [place], [place] [address]    Report Status  PENDING  Incomplete  CULTURE, BLOOD (ROUTINE X 2) w Reflex to ID Panel     Status: None (Preliminary result)   Collection Time: 08/11/17 11:02 PM  Result Value Ref Range Status   Specimen Description BLOOD LEFT ARM  Final   Special Requests   Final    BOTTLES DRAWN AEROBIC AND ANAEROBIC Blood Culture adequate volume   Culture   Final    NO GROWTH 3 DAYS Performed at [facility], [address] [place], [place] [address]    Report Status PENDING  Incomplete  Urine Culture     Status: None   Collection Time: 08/12/17 10:54 AM  Result Value Ref Range Status   Specimen Description   Final    URINE, RANDOM Performed at [facility], [address], [place], [place] [address]    Special Requests   Final    NONE Performed at [facility], [address] [place], [place] [address]    Culture   Final    NO GROWTH Performed at [facility], [place] [address], [place], [place] [address]    Report Status 08/13/2017 FINAL  Final  MRSA PCR Screening     Status: None   Collection Time: 08/12/17  2:17 PM  Result Value Ref Range Status   MRSA by PCR NEGATIVE NEGATIVE Final    Comment:        The GeneXpert MRSA Assay (FDA approved for NASAL specimens only), is one component of a comprehensive MRSA colonization surveillance program. It is not intended to diagnose MRSA infection nor to guide or monitor treatment for MRSA infections. Performed at [facility], [address] [place], [place] [address]   Culture, expectorated sputum-assessment     Status: None   Collection Time: 08/12/17  3:20 PM  Result Value Ref Range Status   Specimen Description EXPECTORATED SPUTUM  Final   Special Requests Normal  Final   Sputum evaluation   Final    Sputum specimen not acceptable for testing.  Please recollect.   NOTIFIED CORI HUDSON AT 1755 08/12/2017.  TFK Performed at [facility], [address] [place], [place] [address]    Report  Status 08/12/2017 FINAL  Final  Respiratory Panel by PCR     Status: None   Collection Time: 08/12/17  5:13 PM  Result Value Ref Range Status   Adenovirus NOT DETECTED NOT DETECTED Final   Coronavirus 229E NOT DETECTED NOT DETECTED Final   Coronavirus HKU1 NOT DETECTED NOT DETECTED Final   Coronavirus NL63 NOT DETECTED NOT DETECTED Final   Coronavirus OC43 NOT DETECTED NOT DETECTED Final   Metapneumovirus NOT  DETECTED NOT DETECTED Final   Rhinovirus / Enterovirus NOT DETECTED NOT DETECTED Final   Influenza A NOT DETECTED NOT DETECTED Final   Influenza B NOT DETECTED NOT DETECTED Final   Parainfluenza Virus 1 NOT DETECTED NOT DETECTED Final   Parainfluenza Virus 2 NOT DETECTED NOT DETECTED Final   Parainfluenza Virus 3 NOT DETECTED NOT DETECTED Final   Parainfluenza Virus 4 NOT DETECTED NOT DETECTED Final   Respiratory Syncytial Virus NOT DETECTED NOT DETECTED Final   Bordetella pertussis NOT DETECTED NOT DETECTED Final   Chlamydophila pneumoniae NOT DETECTED NOT DETECTED Final   Mycoplasma pneumoniae NOT DETECTED NOT DETECTED Final    Comment: Performed at [facility], [place] [address], [place], [place] [address]  Culture, expectorated sputum-assessment     Status: None   Collection Time: 08/13/17 10:08 PM  Result Value Ref Range Status   Specimen Description SPUTUM  Final   Special Requests Normal  Final   Sputum evaluation   Final    THIS SPECIMEN IS ACCEPTABLE FOR SPUTUM CULTURE Performed at [facility], [address], [place], [place] [address]    Report Status 08/14/2017 FINAL  Final  Culture, respiratory (NON-Expectorated)     Status: None (Preliminary result)   Collection Time: 08/13/17 10:08 PM  Result Value Ref Range Status   Specimen Description   Final    SPUTUM Performed at [facility], [address] [place], [place] [address]    Special Requests   Final    Normal Reflexed from [id number] Performed at [facility], [address] [place], [place] [address]    Gram Stain   Final    ABUNDANT WBC PRESENT, PREDOMINANTLY PMN NO SQUAMOUS EPITHELIAL CELLS SEEN MODERATE GRAM POSITIVE COCCI IN PAIRS FEW GRAM POSITIVE RODS RARE GRAM NEGATIVE RODS Performed at [facility], [place] [address], [place],  [address]    Culture PENDING  Incomplete   Report Status PENDING  Incomplete    Studies/Results: No results found.  Assessment/Plan: ANDRIK SANDT is a 57 y.o. male with hx OSA, prior tobacco (30 PY quit age 47), hx morbid obesity s/o roux-en-Y in 20013  prior abnormal CXR, CT in 2017 with R sided cavitary lesion.  At that time he had wu including PET scan , bronch with EBUS 06/15/15 with finding of infiltrated and narrowed RML bronchus. Path showed inflamed lung parenchyma with interstitial fibrosis, but no cultures done at that time  He was treated with augmentin and prednisone and had clinical and radiological improvement.  He had cxr done 01/24/17 with only mild scarring R lung noted.  Currently admitted with a week of illness with cough,s ob, fevers, myalgias.  On admit temp 101.2, wbc 16.6, Flu pcr negative,  CT shows L sided multifocal pna Works in [place] with exposure to dust there. Former smoker. Lives alone. No sick contacts I suspect he has a post influenza PNA which can be quite severe. The most common pathogens include Strep Pna, Staph aureus, H flu and group A strep so will cover broadly while attempting to obtain sputum.  3/15- neg strep pna and legionella. Resp PCR neg. HIV neg. AFB pending, Sputum pending, mixed gram stain. MRSA PCR neg.  Recommendations Finish azithromycin - z pack x 5 days. Can cont zosyn but if improving tomorrow dc on 14 days of oral abx- if culture is negative for psuedomonas or other resistant organisms would send on augmentin 875 to complete the course. Should fu  with pulmonary at dc. No need to see me in followup.    Thank you very much for the consult. Will  follow with you.  Leonel Ramsay   08/14/2017, 3:57 PM

## 2017-08-14 NOTE — Progress Notes (Signed)
[facility] [place] NAME: [name]    MR#:  [id number]  DATE OF BIRTH:  [birth year]  SUBJECTIVE:   Patient says cough, shortness of breath is improved. No hemoptysis. Awaiting AFBs. Clinically improving. She is complaining of some left shoulder blade pain from the cough.  REVIEW OF SYSTEMS:    Review of Systems  Constitutional: Negative for chills and fever.  HENT: Negative for congestion and tinnitus.   Eyes: Negative for blurred vision and double vision.  Respiratory: Positive for cough and shortness of breath. Negative for wheezing.   Cardiovascular: Negative for chest pain, orthopnea and PND.  Gastrointestinal: Negative for abdominal pain, diarrhea, nausea and vomiting.  Genitourinary: Negative for dysuria and hematuria.  Neurological: Negative for dizziness, sensory change and focal weakness.  All other systems reviewed and are negative.   Nutrition: Regular Tolerating Diet: Yes Tolerating PT: Await Eval.   DRUG ALLERGIES:   Allergies  Allergen Reactions  . Sulfa Antibiotics Itching and Rash    More severe reaction 3 years ago, caused pain.  Had taken it prior and not as bad.    VITALS:  Blood pressure 111/61, pulse 100, temperature 98.5 F (36.9 C), temperature source Oral, resp. rate 18, height 6\' 3"  (1.905 m), weight 116.1 kg (256 lb), SpO2 91 %.  PHYSICAL EXAMINATION:   Physical Exam  GENERAL:  57 y.o.-year-old patient lying in bed in no acute distress.  EYES: Pupils equal, round, reactive to light and accommodation. No scleral icterus. Extraocular muscles intact.  HEENT: Head atraumatic, normocephalic. Oropharynx and nasopharynx clear.  NECK:  Supple, no jugular venous distention. No thyroid enlargement, no tenderness.  LUNGS: Normal breath sounds bilaterally, no wheezing, rales, rhonchi. No use of accessory muscles of respiration.  CARDIOVASCULAR: S1, S2 normal. No murmurs, rubs, or gallops.  ABDOMEN: Soft,  nontender, nondistended. Bowel sounds present. No organomegaly or mass.  EXTREMITIES: No cyanosis, clubbing or edema b/l.    NEUROLOGIC: Cranial nerves II through XII are intact. No focal Motor or sensory deficits b/l.  Globally weak.  PSYCHIATRIC: The patient is alert and oriented x 3.  SKIN: No obvious rash, lesion, or ulcer.    LABORATORY PANEL:   CBC Recent Labs  Lab 08/12/17 0545  WBC 14.5*  HGB 10.6*  HCT 31.7*  PLT 235   ------------------------------------------------------------------------------------------------------------------  Chemistries  Recent Labs  Lab 08/11/17 2036  08/13/17 0514  NA 134*   < > 139  K 4.7   < > 4.2  CL 99*   < > 104  CO2 26   < > 26  GLUCOSE 208*   < > 134*  BUN 16   < > 10  CREATININE 0.91   < > 0.69  CALCIUM 8.4*   < > 8.1*  AST 28  --   --   ALT 26  --   --   ALKPHOS 99  --   --   BILITOT 0.7  --   --    < > = values in this interval not displayed.   ------------------------------------------------------------------------------------------------------------------  Cardiac Enzymes No results for input(s): TROPONINI in the last 168 hours. ------------------------------------------------------------------------------------------------------------------  RADIOLOGY:  No results found.   ASSESSMENT AND PLAN:   57 year old male with past medical history of obstructive sleep apnea, morbid obesity status post bariatric surgery, depression, anxiety, bipolar disorder, BPH, GERD, who presented to the hospital due to shortness of breath and noted to have CT chest findings suggestive of atypical pneumonia/tuberculosis.  1. Pneumonia-suspected to be the cause of patient's shortness of breath. Patient CT chest is suggestive of atypical infection/pneumonia. Patient is clinically afebrile and hemodynamically stable though. - Continue Zosyn, Zithromax and likely can be discharged on Oral Augmentin. Respiratory panel by PCR was also negative.  Appreciate infectious disease and pulmonary input.  2. Abnormal CT chest-patient noted to have left-sided caseating lung masses consistent with possible tuberculosis with mediastinal and hilar lymphadenopathy.  -Seen by pulmonary and they recommend continuing current antibiotic treatment for pneumonia. Continue to rule out tuberculosis with AFBs 3 in, QuantiFERON test has been ordered but results pending. Patient will likely need repeat CT scan in the next month or 2 after he finished treatment for his pneumonia. If not improving patient will likely need a bronchoscopy.  3. Anxiety/depression-continue Xanax, Cymbalta.  4. Osteoarthritis - cont. Diclofenac  Possible d/c in next 1-2 days on Oral abx.   All the records are reviewed and case discussed with Care Management/Social Worker. Management plans discussed with the patient, family and they are in agreement.  CODE STATUS: Full code  DVT Prophylaxis: Lovenox  TOTAL TIME TAKING CARE OF THIS PATIENT: 25 minutes.   POSSIBLE D/C IN 1-2 DAYS, DEPENDING ON CLINICAL CONDITION.   Henreitta Leber M.D on 08/14/2017 at 1:28 PM  Between 7am to 6pm - Pager - [phone]  After 6pm go to [email or web address] - Proofreader  [facility]  Office  [phone]  CC: Primary care physician; [name], [name] Glow, MD

## 2017-08-14 NOTE — Consult Note (Signed)
Pharmacy Antibiotic Note  [name] is a 57 y.o. male admitted on 08/11/2017 with pneumonia.  Pharmacy has been consulted for zosyn dosing. Pt also on azithromycin  Plan: Zosyn 3.375g IV q8h (4 hour infusion).  Continue azithromycin for 5 days- last dose tomorrow evening  Height: 6\' 3"  (190.5 cm) Weight: 256 lb (116.1 kg) IBW/kg (Calculated) : 84.5  Temp (24hrs), Avg:99.1 F (37.3 C), Min:98.5 F (36.9 C), Max:100.2 F (37.9 C)  Recent Labs  Lab 08/11/17 2036 08/11/17 2302 08/12/17 0545 08/13/17 0514  WBC 16.6*  --  14.5*  --   CREATININE 0.91  --  0.81 0.69  LATICACIDVEN 2.4* 2.1*  --   --     Estimated Creatinine Clearance: 138.2 mL/min (by C-G formula based on SCr of 0.69 mg/dL).    Allergies  Allergen Reactions  . Sulfa Antibiotics Itching and Rash    More severe reaction 3 years ago, caused pain.  Had taken it prior and not as bad.    Antimicrobials this admission: vanc 3/13>>3/13 Meropenem 3/13>>3/13 Zosyn 3/13 Azithro 3/12>>3/16  Dose adjustments this admission:   Microbiology results: 3/12 BCx: NG 3/13 UCx: NG  3/14 Sputum: pending  3/13 MRSA PCR: neg AFB pending  Thank you for allowing pharmacy to be a part of this patient's care.  Ramond Dial, Pharm.D, BCPS Clinical Pharmacist  08/14/2017 11:58 AM

## 2017-08-14 NOTE — Consult Note (Addendum)
[place] Nurse wound consult note Reason for Consult: Consult requested for left ankle wounds.  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  He states he is followed by the outpatient wound care center and has been using Prisma collagen dressings.  Informed him that this topical treatment is not available in the [facility] [place].   Wound type: 2 full thickness wounds; .8X.8X.1cm and 2X2X.1cm; both 90% red, 10% yellow, mod amt tan drainage, no odor. Periwound: Intact skin surrounding. Dressing procedure/placement/frequency: Substitute Aquacel to absorb drainage and promote healing while in the hospital, and patient can resume followup with the outpatient wound care center after discharge.  Discussed plan of care; he verbalized understanding and plans to resume Collagen dressing after discharge. Please re-consult if further assistance is needed.  Thank-you,  Julien Girt MSN, [place], [place], [place], [place]

## 2017-08-15 ENCOUNTER — Inpatient Hospital Stay: Payer: 59

## 2017-08-15 LAB — CBC
HCT: 37.1 % — ABNORMAL LOW (ref 40.0–52.0)
HEMOGLOBIN: 12.4 g/dL — AB (ref 13.0–18.0)
MCH: 30.4 pg (ref 26.0–34.0)
MCHC: 33.5 g/dL (ref 32.0–36.0)
MCV: 90.7 fL (ref 80.0–100.0)
Platelets: 403 10*3/uL (ref 150–440)
RBC: 4.08 MIL/uL — AB (ref 4.40–5.90)
RDW: 15 % — ABNORMAL HIGH (ref 11.5–14.5)
WBC: 10.9 10*3/uL — AB (ref 3.8–10.6)

## 2017-08-15 NOTE — Progress Notes (Signed)
cpap refused 

## 2017-08-15 NOTE — Progress Notes (Signed)
I called down to lab to ask if the pt had provided all the sputum samples necessary to test and rule out tuberculosis. They informed me that only one sample sputum has been found to be acceptable for testing to rule out tb and that he would need two additional samples on two different days. Pt was notified and he was able to provide an additional sample today. He will need one more on 3/17.

## 2017-08-15 NOTE — Progress Notes (Signed)
Patient ID: [name], male   DOB: May 18, 1961, 57 y.o.   MRN: [id number]  [employer] PROGRESS NOTE  BRAN ALDRIDGE RKY:[id number] DOB: [birth year] DOA: 08/11/2017 PCP: McLean-Scocuzza, Nino Glow, MD  HPI/Subjective: Patient not feeling great.  Still coughing up brownish phlegm.  Still short of breath.  He has a nonhealing ulcer on his left lower extremity.  Objective: Vitals:   08/15/17 0739 08/15/17 1316  BP: (!) 111/54   Pulse: 93   Resp: 18   Temp: 98.8 F (37.1 C)   SpO2: 92% 90%    Filed Weights   08/12/17 1051  Weight: 116.1 kg (256 lb)    ROS: Review of Systems  Constitutional: Negative for chills and fever.  Eyes: Negative for blurred vision.  Respiratory: Positive for cough and shortness of breath.   Cardiovascular: Negative for chest pain.  Gastrointestinal: Positive for diarrhea. Negative for abdominal pain, constipation, nausea and vomiting.  Genitourinary: Negative for dysuria.  Musculoskeletal: Negative for joint pain.  Neurological: Negative for dizziness and headaches.   Exam: Physical Exam  Constitutional: He is oriented to person, place, and time.  HENT:  Nose: No mucosal edema.  Mouth/Throat: No oropharyngeal exudate or posterior oropharyngeal edema.  Eyes: Conjunctivae, EOM and lids are normal. Pupils are equal, round, and reactive to light.  Neck: No JVD present. Carotid bruit is not present. No edema present. No thyroid mass and no thyromegaly present.  Cardiovascular: S1 normal and S2 normal. Exam reveals no gallop.  No murmur heard. Pulses:      Dorsalis pedis pulses are 2+ on the right side, and 2+ on the left side.  Respiratory: No respiratory distress. He has decreased breath sounds in the right middle field, the right lower field, the left middle field and the left lower field. He has no wheezes. He has rhonchi in the right lower field and the left lower field. He has no rales.  GI: Soft. Bowel sounds are normal. There is no tenderness.   Musculoskeletal:       Right ankle: He exhibits no swelling.       Left ankle: He exhibits no swelling.  Lymphadenopathy:    He has no cervical adenopathy.  Neurological: He is alert and oriented to person, place, and time. No cranial nerve deficit.  Skin: Skin is warm. Nails show no clubbing.  Left ankle area nonhealing ulcer  Psychiatric: He has a normal mood and affect.      Data Reviewed: Basic Metabolic Panel: Recent Labs  Lab 08/11/17 2036 08/12/17 0545 08/13/17 0514  NA 134* 138 139  K 4.7 3.6 4.2  CL 99* 103 104  CO2 _0 GLUCOSE 208* 129* 134*  BUN _1 CREATININE 0.91 0.81 0.69  CALCIUM 8.4* 8.1* 8.1*   Liver Function Tests: Recent Labs  Lab 08/11/17 2036  AST 28  ALT 26  ALKPHOS 99  BILITOT 0.7  PROT 7.0  ALBUMIN 2.7*   CBC: Recent Labs  Lab 08/11/17 2036 08/12/17 0545 08/15/17 0422  WBC 16.6* 14.5* 10.9*  NEUTROABS 15.4*  --   --   HGB 12.1* 10.6* 12.4*  HCT 37.1* 31.7* 37.1*  MCV 90.8 90.7 90.7  PLT 252 235 403     Recent Results (from the past 240 hour(s))  CULTURE, BLOOD (ROUTINE X 2) w Reflex to ID Panel     Status: None (Preliminary result)   Collection Time: 08/11/17  9:18 PM  Result Value Ref Range Status  Specimen Description BLOOD LEFT ANTECUBITAL  Final   Special Requests   Final    BOTTLES DRAWN AEROBIC AND ANAEROBIC Blood Culture adequate volume   Culture   Final    NO GROWTH 4 DAYS Performed at [facility], [address] [place], [place] [address]    Report Status PENDING  Incomplete  CULTURE, BLOOD (ROUTINE X 2) w Reflex to ID Panel     Status: None (Preliminary result)   Collection Time: 08/11/17 11:02 PM  Result Value Ref Range Status   Specimen Description BLOOD LEFT ARM  Final   Special Requests   Final    BOTTLES DRAWN AEROBIC AND ANAEROBIC Blood Culture adequate volume   Culture   Final    NO GROWTH 4 DAYS Performed at [facility], [address], [place], [place]  [address]    Report Status PENDING  Incomplete  Urine Culture     Status: None   Collection Time: 08/12/17 10:54 AM  Result Value Ref Range Status   Specimen Description   Final    URINE, RANDOM Performed at [facility], [address], [place], [place] [address]    Special Requests   Final    NONE Performed at [facility], [address] [place], [place] [address]    Culture   Final    NO GROWTH Performed at [facility], [place] [address] [place], [place] [address]    Report Status 08/13/2017 FINAL  Final  MRSA PCR Screening     Status: None   Collection Time: 08/12/17  2:17 PM  Result Value Ref Range Status   MRSA by PCR NEGATIVE NEGATIVE Final    Comment:        The GeneXpert MRSA Assay (FDA approved for NASAL specimens only), is one component of a comprehensive MRSA colonization surveillance program. It is not intended to diagnose MRSA infection nor to guide or monitor treatment for MRSA infections. Performed at [facility], [address] [place], [place] [address]   Culture, expectorated sputum-assessment     Status: None   Collection Time: 08/12/17  3:20 PM  Result Value Ref Range Status   Specimen Description EXPECTORATED SPUTUM  Final   Special Requests Normal  Final   Sputum evaluation   Final    Sputum specimen not acceptable for testing.  Please recollect.   NOTIFIED CORI HUDSON AT 1755 08/12/2017.  TFK Performed at [facility], [address] [place], [place] [address]    Report Status 08/12/2017 FINAL  Final  Respiratory Panel by PCR     Status: None   Collection Time: 08/12/17  5:13 PM  Result Value Ref Range Status   Adenovirus NOT DETECTED NOT DETECTED Final   Coronavirus 229E NOT DETECTED NOT DETECTED Final   Coronavirus HKU1 NOT DETECTED NOT DETECTED Final   Coronavirus NL63 NOT DETECTED NOT DETECTED Final   Coronavirus OC43 NOT DETECTED NOT DETECTED Final   Metapneumovirus NOT DETECTED NOT  DETECTED Final   Rhinovirus / Enterovirus NOT DETECTED NOT DETECTED Final   Influenza A NOT DETECTED NOT DETECTED Final   Influenza B NOT DETECTED NOT DETECTED Final   Parainfluenza Virus 1 NOT DETECTED NOT DETECTED Final   Parainfluenza Virus 2 NOT DETECTED NOT DETECTED Final   Parainfluenza Virus 3 NOT DETECTED NOT DETECTED Final   Parainfluenza Virus 4 NOT DETECTED NOT DETECTED Final   Respiratory Syncytial Virus NOT DETECTED NOT DETECTED Final   Bordetella pertussis NOT  DETECTED NOT DETECTED Final   Chlamydophila pneumoniae NOT DETECTED NOT DETECTED Final   Mycoplasma pneumoniae NOT DETECTED NOT DETECTED Final    Comment: Performed at [facility], [place] [address] [place], [place] [address]  Culture, expectorated sputum-assessment     Status: None   Collection Time: 08/13/17 10:08 PM  Result Value Ref Range Status   Specimen Description SPUTUM  Final   Special Requests Normal  Final   Sputum evaluation   Final    THIS SPECIMEN IS ACCEPTABLE FOR SPUTUM CULTURE Performed at [facility], [address], [place], [place] [address]    Report Status 08/14/2017 FINAL  Final  Culture, respiratory (NON-Expectorated)     Status: None (Preliminary result)   Collection Time: 08/13/17 10:08 PM  Result Value Ref Range Status   Specimen Description   Final    SPUTUM Performed at [facility], [address], [place], [place] [address]    Special Requests   Final    Normal Reflexed from [id number] Performed at [facility], [address] [place], [place] [address]    Gram Stain   Final    ABUNDANT WBC PRESENT, PREDOMINANTLY PMN NO SQUAMOUS EPITHELIAL CELLS SEEN MODERATE GRAM POSITIVE COCCI IN PAIRS FEW GRAM POSITIVE RODS RARE GRAM NEGATIVE RODS Performed at [facility], [place] [address] [place], [place] [address]    Culture PENDING  Incomplete   Report Status PENDING  Incomplete     Studies: Mr Tibia Fibula Left Wo Contrast  Result Date:  08/15/2017 CLINICAL DATA:  Left ankle wound.  History of remote trauma. EXAM: MRI OF LOWER LEFT EXTREMITY WITHOUT CONTRAST TECHNIQUE: Multiplanar, multisequence MR imaging of the left lower extremity was performed. No intravenous contrast was administered. COMPARISON:  None. FINDINGS: Exam is quite limited due to significant artifact from left lower extremity hardware. I do not see any obvious findings to suggest osteomyelitis or septic arthritis. No obvious changes of cellulitis or myofasciitis. IMPRESSION: Very limited examination due to artifact from hardware. No definite MR findings to suggest septic arthritis or osteomyelitis Electronically Signed   By: Marijo Sanes M.D.   On: 08/15/2017 12:52    Scheduled Meds: . azithromycin  250 mg Oral q1800  . budesonide (PULMICORT) nebulizer solution  0.5 mg Nebulization BID  . DULoxetine  120 mg Oral BH-q7a  . ipratropium-albuterol  3 mL Nebulization Q6H  . QUEtiapine  600 mg Oral QHS   Continuous Infusions: . piperacillin-tazobactam (ZOSYN)  IV 3.375 g (08/15/17 1320)    Assessment/Plan:  1. Multifocal pneumonia with multiple foci of caseation or necrosis.  Patiently currently on Zosyn and Zithromax as per infectious disease.  He will need a total of 14 days worth of antibiotic. 2. Abnormal CT scan of the chest.  Will need another CT scan as outpatient for further evaluation.  AFBs are still pending.  QuantiFERON gold still pending. 3. Acute hypoxic respiratory failure.  Unable to get the patient off oxygen at this point.  Patient on 2 L of oxygen.  Pulse ox on room air 88%. 4. Anxiety depression on Xanax and Cymbalta 5. Osteoarthritis on diclofenac 6. Nonhealing wound left lower extremity.  MRI was unable to delineate on whether there is a deeper seeded infection.  I will order an ESR for tomorrow which will likely be high because of his multifocal pneumonia.  Code Status:     Code Status Orders  (From admission, onward)        Start  Ordered   08/11/17 1939  Full code  Continuous     08/11/17 1938    Code Status History    Date Active Date Inactive Code Status Order ID Comments User Context   12/26/2015 03:00 12/28/2015 21:22 Full Code [id number]  Etta Quill, DO Inpatient      Disposition Plan: Hopefully home once able to get off oxygen  Consultants:  Pulmonology  Infectious disease  Antibiotics:  Zosyn  Zithromax  Time spent: 28 minutes  [place]

## 2017-08-15 NOTE — Progress Notes (Signed)
Ambulated pt in room on RA:  At rest on RA pt was 92%  Ambulating in RA oxygen saturations fluctuated between 90%-93%  Pt denied SOB or dyspnea.

## 2017-08-16 LAB — CULTURE, RESPIRATORY W GRAM STAIN

## 2017-08-16 LAB — BASIC METABOLIC PANEL
ANION GAP: 7 (ref 5–15)
BUN: 14 mg/dL (ref 6–20)
CALCIUM: 8.5 mg/dL — AB (ref 8.9–10.3)
CO2: 29 mmol/L (ref 22–32)
Chloride: 100 mmol/L — ABNORMAL LOW (ref 101–111)
Creatinine, Ser: 0.73 mg/dL (ref 0.61–1.24)
Glucose, Bld: 139 mg/dL — ABNORMAL HIGH (ref 65–99)
Potassium: 4.4 mmol/L (ref 3.5–5.1)
Sodium: 136 mmol/L (ref 135–145)

## 2017-08-16 LAB — CULTURE, RESPIRATORY: SPECIAL REQUESTS: NORMAL

## 2017-08-16 LAB — CULTURE, BLOOD (ROUTINE X 2)
Culture: NO GROWTH
Culture: NO GROWTH
SPECIAL REQUESTS: ADEQUATE
Special Requests: ADEQUATE

## 2017-08-16 LAB — SEDIMENTATION RATE: Sed Rate: 85 mm/hr — ABNORMAL HIGH (ref 0–20)

## 2017-08-16 MED ORDER — TRAMADOL HCL 50 MG PO TABS: 50.0000 mg | ORAL_TABLET | Freq: Three times a day (TID) | ORAL | 0 refills | Status: DC | PRN

## 2017-08-16 MED ORDER — BUDESONIDE-FORMOTEROL FUMARATE 160-4.5 MCG/ACT IN AERO: 2.0000 | INHALATION_SPRAY | Freq: Two times a day (BID) | RESPIRATORY_TRACT | 0 refills | Status: DC

## 2017-08-16 MED ORDER — AMOXICILLIN-POT CLAVULANATE 875-125 MG PO TABS: 1.0000 | ORAL_TABLET | Freq: Two times a day (BID) | ORAL | Status: DC

## 2017-08-16 MED ORDER — AMOXICILLIN-POT CLAVULANATE 875-125 MG PO TABS: 1.0000 | ORAL_TABLET | Freq: Two times a day (BID) | ORAL | 0 refills | Status: DC

## 2017-08-16 MED ORDER — TRAMADOL HCL 50 MG PO TABS: 50.0000 mg | ORAL_TABLET | Freq: Three times a day (TID) | ORAL | Status: DC | PRN

## 2017-08-16 NOTE — Progress Notes (Signed)
Patient ID: [name], male   DOB: 1961-01-27, 57 y.o.   [place] [facility] was admitted to the Hospital on 08/11/2017 and Discharged  08/16/2017 and should be excused from work/school   for 13  days starting 08/11/2017 , may return to work/school without any restrictions.  Loletha Grayer M.D on 08/16/2017,at 12:03 PM  [place] at [facility]  [phone]

## 2017-08-16 NOTE — Progress Notes (Signed)
Pt is A&OX4. In no acute distress. VSS. RN walked pt on room air and pt is sating at 93% on room air while ambulating. Discharge instructions explained to pt and pt verbalized understanding. Wheeled to visitors entrance by [employer] and assisted into brothers vehicle.

## 2017-08-16 NOTE — Discharge Summary (Signed)
[facility] [place] NAME: [name]    MR#:  [id number]  DATE OF BIRTH:  April 26, 1961  DATE OF ADMISSION:  08/11/2017 ADMITTING PHYSICIAN: Loletha Grayer, MD  DATE OF DISCHARGE: 08/16/2017 12:18 PM  PRIMARY CARE PHYSICIAN: McLean-Scocuzza, Nino Glow, MD    ADMISSION DIAGNOSIS:  Hypoxia Infection Vs Malignancy  DISCHARGE DIAGNOSIS:  Active Problems:   Dyspnea   Pneumonia   Sepsis ([place])   SECONDARY DIAGNOSIS:   Past Medical History:  Diagnosis Date  . Abnormal finding on GI tract imaging    2006 mesenteritis   . Adrenal adenoma, right    noted CT 06/2015 PET 06/2015 benign   . Anxiety   . Arthritis    "knees, some in my ankles; back" (09/19/2015)  . Bipolar affective ([place])    takes Seroquel nightly  . BPH (benign prostatic hyperplasia)   . Colitis 11/19/2016   On colonoscopy 02/21/2014, biopsy - benign w/acute inflammation and minimal crypt distortion, unclear etiology  . Corneal erosion of left eye 06/12/2017  . DDD (degenerative disc disease), lumbar   . Depression   . Diverticulosis of colon without diverticulitis 11/19/2016  . Family history of prostate cancer 11/11/2016   Father and brother  . GERD (gastroesophageal reflux disease)   . H/O multiple pulmonary nodules    biopsies negative  . Hidradenitis   . Iron deficiency anemia   . Joint pain   . Kidney stones    "passed"  . Morbid obesity ([place])   . Muscle spasm of both lower legs    takes Baclofen daily as needed;notices in hands as well  . OSA (obstructive sleep apnea)    does not use CPAP; "mostly cured w/gastric bypass; borderline result in 2013 sleep study" (09/19/2015)  . Pneumonia 06/2015   put on Prednisone every other day  . 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 Urinary frequency   . Urinary urgency     HOSPITAL COURSE:   1.  Multifocal pneumonia with multiple foci of caseation or necrosis.  Patient was on Zosyn IV during the hospital course and finished a  course of Zithromax.  The patient was seen in consultation by infectious disease and recommended another 14 days of Augmentin. 2.  Abnormal CT scan of the chest.  QuantiFERON gold and AFB still pending.  If AFBs turn positive the patient will have to be referred to the department of health.  As per Dr. Ola Spurr okay to send out without the AFBs resulted yet.  Follow-up with pulmonary Dr. Ashby Dawes on his outpatient.  He will need a follow-up CT scan of the chest as outpatient 3.  Acute hypoxic respiratory failure.  The patient was able to wean off oxygen.  Pulse ox off the oxygen with ambulation still in the 90s. 4.  Anxiety depression on Xanax and Cymbalta 5.  Nonhealing wound of the left lower extremity.  MRI was unable to delineate on whether there is a deeper seeded infection.  ESR in the 80s.  This could be secondary to the multifocal pneumonia. 6.  Note for work out another week.  DISCHARGE CONDITIONS:   Satisfactory  CONSULTS OBTAINED:  Treatment Team:  Leonel Ramsay, MD Laverle Hobby, MD Allyne Gee, MD  DRUG ALLERGIES:   Allergies  Allergen Reactions  . Sulfa Antibiotics Itching and Rash    More severe reaction 3 years ago, caused pain.  Had taken it prior and not as bad.  DISCHARGE MEDICATIONS:   Allergies as of 08/16/2017      Reactions   Sulfa Antibiotics Itching, Rash   More severe reaction 3 years ago, caused pain.  Had taken it prior and not as bad.      Medication List    STOP taking these medications   diclofenac 75 MG EC tablet Commonly known as:  VOLTAREN   methocarbamol 750 MG tablet Commonly known as:  ROBAXIN-750   mupirocin ointment 2 % Commonly known as:  BACTROBAN     TAKE these medications   acetaminophen 500 MG tablet Commonly known as:  TYLENOL Take 1,000 mg by mouth every 8 (eight) hours as needed for mild pain or moderate pain.   albuterol 108 (90 Base) MCG/ACT inhaler Commonly known as:  PROVENTIL HFA;VENTOLIN  HFA Inhale 1-2 puffs into the lungs every 6 (six) hours as needed for wheezing or shortness of breath.   ALPRAZolam 1 MG tablet Commonly known as:  XANAX Take 1 mg by mouth 3 (three) times daily as needed for anxiety. Taking 0.5 to 3 mg qd per psychiatry   amoxicillin-clavulanate 875-125 MG tablet Commonly known as:  AUGMENTIN Take 1 tablet by mouth every 12 (twelve) hours.   budesonide-formoterol 160-4.5 MCG/ACT inhaler Commonly known as:  SYMBICORT Inhale 2 puffs into the lungs 2 (two) times daily. Rinse mouth   DULOXETINE HCL PO Take 120 mg by mouth every morning.   QUEtiapine 300 MG tablet Commonly known as:  SEROQUEL Take 600 mg by mouth at bedtime.   traMADol 50 MG tablet Commonly known as:  ULTRAM Take 1 tablet (50 mg total) by mouth every 8 (eight) hours as needed. What changed:  additional instructions        DISCHARGE INSTRUCTIONS:   Follow-up 1 week PMD Follow-up 2 weeks pulmonary  If you experience worsening of your admission symptoms, develop shortness of breath, life threatening emergency, suicidal or homicidal thoughts you must seek medical attention immediately by calling 911 or calling your MD immediately  if symptoms less severe.  You Must read complete instructions/literature along with all the possible adverse reactions/side effects for all the Medicines you take and that have been prescribed to you. Take any new Medicines after you have completely understood and accept all the possible adverse reactions/side effects.   Please note  You were cared for by a hospitalist during your hospital stay. If you have any questions about your discharge medications or the care you received while you were in the hospital after you are discharged, you can call the unit and asked to speak with the hospitalist on call if the hospitalist that took care of you is not available. Once you are discharged, your primary care physician will handle any further medical issues.  Please note that NO REFILLS for any discharge medications will be authorized once you are discharged, as it is imperative that you return to your primary care physician (or establish a relationship with a primary care physician if you do not have one) for your aftercare needs so that they can reassess your need for medications and monitor your lab values.    Today   CHIEF COMPLAINT:  No chief complaint on file.   HISTORY OF PRESENT ILLNESS:  [name]  is a 57 y.o. male brought in with shortness of breath and cough.   VITAL SIGNS:  Blood pressure 134/78, pulse 91, temperature 98 F (36.7 C), temperature source Oral, resp. rate 18, height 6' 3" (1.905 m), weight 116.1 kg (  256 lb), SpO2 91 %.    PHYSICAL EXAMINATION:  GENERAL:  57 y.o.-year-old patient lying in the bed with no acute distress.  EYES: Pupils equal, round, reactive to light and accommodation. No scleral icterus. Extraocular muscles intact.  HEENT: Head atraumatic, normocephalic. Oropharynx and nasopharynx clear.  NECK:  Supple, no jugular venous distention. No thyroid enlargement, no tenderness.  LUNGS:  decreased breath sounds bilaterally, no wheezing, rales,rhonchi or crepitation. No use of accessory muscles of respiration.  CARDIOVASCULAR: S1, S2 normal. No murmurs, rubs, or gallops.  ABDOMEN: Soft, non-tender, non-distended. Bowel sounds present. No organomegaly or mass.  EXTREMITIES: No pedal edema, cyanosis, or clubbing.  NEUROLOGIC: Cranial nerves II through XII are intact. Muscle strength 5/5 in all extremities. Sensation intact. Gait not checked.  PSYCHIATRIC: The patient is alert and oriented x 3.  SKIN: Left lower extremity small nonhealing wound  DATA REVIEW:   CBC Recent Labs  Lab 08/15/17 0422  WBC 10.9*  HGB 12.4*  HCT 37.1*  PLT 403    Chemistries  Recent Labs  Lab 08/11/17 2036  08/16/17 0514  NA 134*   < > 136  K 4.7   < > 4.4  CL 99*   < > 100*  CO2 26   < > 29  GLUCOSE 208*   < >  139*  BUN 16   < > 14  CREATININE 0.91   < > 0.73  CALCIUM 8.4*   < > 8.5*  AST 28  --   --   ALT 26  --   --   ALKPHOS 99  --   --   BILITOT 0.7  --   --    < > = values in this interval not displayed.     Microbiology Results  Results for orders placed or performed during the hospital encounter of 08/11/17  CULTURE, BLOOD (ROUTINE X 2) w Reflex to ID Panel     Status: None   Collection Time: 08/11/17  9:18 PM  Result Value Ref Range Status   Specimen Description BLOOD LEFT ANTECUBITAL  Final   Special Requests   Final    BOTTLES DRAWN AEROBIC AND ANAEROBIC Blood Culture adequate volume   Culture   Final    NO GROWTH 5 DAYS Performed at [facility], [address] [place], [place] [address]    Report Status 08/16/2017 FINAL  Final  CULTURE, BLOOD (ROUTINE X 2) w Reflex to ID Panel     Status: None   Collection Time: 08/11/17 11:02 PM  Result Value Ref Range Status   Specimen Description BLOOD LEFT ARM  Final   Special Requests   Final    BOTTLES DRAWN AEROBIC AND ANAEROBIC Blood Culture adequate volume   Culture   Final    NO GROWTH 5 DAYS Performed at [facility], [address] [place], [place] [address]    Report Status 08/16/2017 FINAL  Final  Acid Fast Smear (AFB)     Status: None   Collection Time: 08/12/17  7:57 AM  Result Value Ref Range Status   AFB Specimen Processing Concentration  Final   Acid Fast Smear Negative  Final    Comment: (NOTE) Performed At: [facility] [place], [place] [id number] Rush Farmer MD UX:[id number]    Source (AFB) SPU  Final    Comment: Performed at [facility], [address] [place], [place] [address]  Urine Culture     Status: None   Collection  Time: 08/12/17 10:54 AM  Result Value Ref Range Status   Specimen Description   Final    URINE, RANDOM Performed at [facility], [address] [place], [place] [address]    Special Requests   Final     NONE Performed at [facility], [address], [place], [place] [address]    Culture   Final    NO GROWTH Performed at [facility], [place] [address], [place], [place] [address]    Report Status 08/13/2017 FINAL  Final  MRSA PCR Screening     Status: None   Collection Time: 08/12/17  2:17 PM  Result Value Ref Range Status   MRSA by PCR NEGATIVE NEGATIVE Final    Comment:        The GeneXpert MRSA Assay (FDA approved for NASAL specimens only), is one component of a comprehensive MRSA colonization surveillance program. It is not intended to diagnose MRSA infection nor to guide or monitor treatment for MRSA infections. Performed at [facility], [place], [place] [address]   Acid Fast Smear (AFB)     Status: None   Collection Time: 08/12/17  3:20 PM  Result Value Ref Range Status   AFB Specimen Processing Concentration  Final   Acid Fast Smear Negative  Final    Comment: (NOTE) Performed At: [facility] [address] [place], [place] [id number] Rush Farmer MD [id number]    Source (AFB) SPU  Final    Comment: Performed at [facility], [address] [place], [place] [address]  Culture, expectorated sputum-assessment     Status: None   Collection Time: 08/12/17  3:20 PM  Result Value Ref Range Status   Specimen Description EXPECTORATED SPUTUM  Final   Special Requests Normal  Final   Sputum evaluation   Final    Sputum specimen not acceptable for testing.  Please recollect.   NOTIFIED CORI HUDSON AT 1755 08/12/2017.  TFK Performed at [facility], [address] [place], [place] [address]    Report Status 08/12/2017 FINAL  Final  Respiratory Panel by PCR     Status: None   Collection Time: 08/12/17  5:13 PM  Result Value Ref Range Status   Adenovirus NOT DETECTED NOT DETECTED Final   Coronavirus 229E NOT DETECTED NOT DETECTED Final   Coronavirus HKU1 NOT DETECTED NOT DETECTED Final    Coronavirus NL63 NOT DETECTED NOT DETECTED Final   Coronavirus OC43 NOT DETECTED NOT DETECTED Final   Metapneumovirus NOT DETECTED NOT DETECTED Final   Rhinovirus / Enterovirus NOT DETECTED NOT DETECTED Final   Influenza A NOT DETECTED NOT DETECTED Final   Influenza B NOT DETECTED NOT DETECTED Final   Parainfluenza Virus 1 NOT DETECTED NOT DETECTED Final   Parainfluenza Virus 2 NOT DETECTED NOT DETECTED Final   Parainfluenza Virus 3 NOT DETECTED NOT DETECTED Final   Parainfluenza Virus 4 NOT DETECTED NOT DETECTED Final   Respiratory Syncytial Virus NOT DETECTED NOT DETECTED Final   Bordetella pertussis NOT DETECTED NOT DETECTED Final   Chlamydophila pneumoniae NOT DETECTED NOT DETECTED Final   Mycoplasma pneumoniae NOT DETECTED NOT DETECTED Final    Comment: Performed at [facility], [place] [address] [place], [place] [address]  Culture, expectorated sputum-assessment     Status: None   Collection Time: 08/13/17 10:08 PM  Result Value Ref Range Status   Specimen Description SPUTUM  Final   Special Requests Normal  Final   Sputum  evaluation   Final    THIS SPECIMEN IS ACCEPTABLE FOR SPUTUM CULTURE Performed at [facility], [address] [place], [place] [address]    Report Status 08/14/2017 FINAL  Final  Culture, respiratory (NON-Expectorated)     Status: None   Collection Time: 08/13/17 10:08 PM  Result Value Ref Range Status   Specimen Description   Final    SPUTUM Performed at [facility], [address] [place], [place] [address]    Special Requests   Final    Normal Reflexed from [id number] Performed at [facility], [address] [place], [place] [address]    Gram Stain   Final    ABUNDANT WBC PRESENT, PREDOMINANTLY PMN NO SQUAMOUS EPITHELIAL CELLS SEEN MODERATE GRAM POSITIVE COCCI IN PAIRS FEW GRAM POSITIVE RODS RARE GRAM NEGATIVE RODS Performed at [facility], [address], [place], [place] [address]    Culture MODERATE  ESCHERICHIA COLI  Final   Report Status 08/16/2017 FINAL  Final   Organism ID, Bacteria ESCHERICHIA COLI  Final      Susceptibility   Escherichia coli - MIC*    AMPICILLIN <=2 SENSITIVE Sensitive     CEFAZOLIN <=4 SENSITIVE Sensitive     CEFEPIME <=1 SENSITIVE Sensitive     CEFTAZIDIME <=1 SENSITIVE Sensitive     CEFTRIAXONE <=1 SENSITIVE Sensitive     CIPROFLOXACIN 1 SENSITIVE Sensitive     GENTAMICIN <=1 SENSITIVE Sensitive     IMIPENEM <=0.25 SENSITIVE Sensitive     TRIMETH/SULFA <=20 SENSITIVE Sensitive     AMPICILLIN/SULBACTAM <=2 SENSITIVE Sensitive     PIP/TAZO <=4 SENSITIVE Sensitive     Extended ESBL NEGATIVE Sensitive     * MODERATE ESCHERICHIA COLI    RADIOLOGY:  Mr Tibia Fibula Left Wo Contrast  Result Date: 08/15/2017 CLINICAL DATA:  Left ankle wound.  History of remote trauma. EXAM: MRI OF LOWER LEFT EXTREMITY WITHOUT CONTRAST TECHNIQUE: Multiplanar, multisequence MR imaging of the left lower extremity was performed. No intravenous contrast was administered. COMPARISON:  None. FINDINGS: Exam is quite limited due to significant artifact from left lower extremity hardware. I do not see any obvious findings to suggest osteomyelitis or septic arthritis. No obvious changes of cellulitis or myofasciitis. IMPRESSION: Very limited examination due to artifact from hardware. No definite MR findings to suggest septic arthritis or osteomyelitis Electronically Signed   By: Marijo Sanes M.D.   On: 08/15/2017 12:52     Management plans discussed with the patient, and he is in agreement.  CODE STATUS:     Code Status Orders  (From admission, onward)        Start     Ordered   08/11/17 1939  Full code  Continuous     08/11/17 1938    Code Status History    Date Active Date Inactive Code Status Order ID Comments User Context   12/26/2015 03:00 12/28/2015 21:22 Full Code [id number]  Etta Quill, DO Inpatient      TOTAL TIME TAKING CARE OF THIS PATIENT: 35 minutes.     Loletha Grayer M.D on 08/16/2017 at 2:13 PM  Between 7am to 6pm - Pager - [phone]  After 6pm go to [email or web address] - password [email or web address] [facility]  [phone]  CC: Primary care physician; McLean-Scocuzza, Nino Glow, MD

## 2017-08-17 LAB — ACID FAST SMEAR (AFB, MYCOBACTERIA): Acid Fast Smear: NEGATIVE

## 2017-08-17 LAB — QUANTIFERON-TB GOLD PLUS (RQFGPL)
QUANTIFERON TB1 AG VALUE: 0.03 [IU]/mL
QUANTIFERON TB2 AG VALUE: 0.13 [IU]/mL
QuantiFERON Mitogen Value: 0.07 IU/mL
QuantiFERON Nil Value: 0.03 IU/mL

## 2017-08-17 LAB — QUANTIFERON-TB GOLD PLUS: QUANTIFERON-TB GOLD PLUS: UNDETERMINED

## 2017-08-17 LAB — ACID FAST SMEAR (AFB)

## 2017-08-18 ENCOUNTER — Telehealth: Payer: Self-pay

## 2017-08-18 LAB — ACID FAST SMEAR (AFB): ACID FAST SMEAR - AFSCU2: NEGATIVE

## 2017-08-18 LAB — ACID FAST SMEAR (AFB, MYCOBACTERIA)

## 2017-08-18 NOTE — Telephone Encounter (Signed)
Copied from [place] [id number]. Topic: General - Other >> Aug 18, 2017  9:33 AM Bea Graff, NT wrote: Reason for CRM: Pt bringing by short term disability forms for his job where he was in the hospital for pnuemonia.

## 2017-08-18 NOTE — Telephone Encounter (Signed)
Ok advise him I will not be able to do until week of 08/24/17   [place]

## 2017-08-18 NOTE — Telephone Encounter (Signed)
Patient will be coming into office Friday with [employer] to fill out Short term disability paperwork.

## 2017-08-18 NOTE — Telephone Encounter (Signed)
FYI

## 2017-08-20 ENCOUNTER — Telehealth: Payer: Self-pay

## 2017-08-20 NOTE — Telephone Encounter (Signed)
Patient returned my call.  States he has filled and is taking what medications he needs to be on.  Said some of what he received was duplicates so did not fill right away.  He doesn't have any other questions or concerns at this time.  I thanked him for his time and informed him that he would receive one more automated call in the next few days checking on him a final time.

## 2017-08-20 NOTE — Telephone Encounter (Signed)
Flagged on [employer] report for having unfilled prescriptions and not being able to fill them today or tomorrow.  Attempted to reach patient, though had to leave voicemail.  Will attempt at later time.

## 2017-08-21 ENCOUNTER — Encounter: Payer: Self-pay | Admitting: Family

## 2017-08-21 ENCOUNTER — Telehealth: Payer: Self-pay | Admitting: Internal Medicine

## 2017-08-21 ENCOUNTER — Telehealth: Payer: Self-pay | Admitting: Family

## 2017-08-21 ENCOUNTER — Encounter: Payer: 59 | Admitting: Physician Assistant

## 2017-08-21 ENCOUNTER — Ambulatory Visit (INDEPENDENT_AMBULATORY_CARE_PROVIDER_SITE_OTHER): Payer: 59 | Admitting: Family

## 2017-08-21 VITALS — BP 122/64 | HR 95 | Temp 98.4°F | Wt 251.0 lb

## 2017-08-21 DIAGNOSIS — J189 Pneumonia, unspecified organism: Secondary | ICD-10-CM

## 2017-08-21 DIAGNOSIS — S81802S Unspecified open wound, left lower leg, sequela: Secondary | ICD-10-CM | POA: Diagnosis not present

## 2017-08-21 DIAGNOSIS — S81802A Unspecified open wound, left lower leg, initial encounter: Secondary | ICD-10-CM | POA: Insufficient documentation

## 2017-08-21 DIAGNOSIS — Z809 Family history of malignant neoplasm, unspecified: Secondary | ICD-10-CM | POA: Diagnosis not present

## 2017-08-21 DIAGNOSIS — L97822 Non-pressure chronic ulcer of other part of left lower leg with fat layer exposed: Secondary | ICD-10-CM | POA: Diagnosis present

## 2017-08-21 DIAGNOSIS — I739 Peripheral vascular disease, unspecified: Secondary | ICD-10-CM | POA: Diagnosis not present

## 2017-08-21 DIAGNOSIS — Z8249 Family history of ischemic heart disease and other diseases of the circulatory system: Secondary | ICD-10-CM | POA: Diagnosis not present

## 2017-08-21 DIAGNOSIS — F419 Anxiety disorder, unspecified: Secondary | ICD-10-CM | POA: Diagnosis not present

## 2017-08-21 DIAGNOSIS — I890 Lymphedema, not elsewhere classified: Secondary | ICD-10-CM | POA: Diagnosis not present

## 2017-08-21 DIAGNOSIS — G4730 Sleep apnea, unspecified: Secondary | ICD-10-CM | POA: Diagnosis not present

## 2017-08-21 LAB — MTB NAA WITHOUT AFB CULTURE: M Tuberculosis, Naa: NEGATIVE

## 2017-08-21 NOTE — Telephone Encounter (Signed)
Left message to return call 
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
## 2017-08-21 NOTE — Patient Instructions (Addendum)
Repeat CT chest as discussed with Dr Ashby Dawes, pulmonology  I will clarify with infectious disease.  Today we discussed referrals, orders- ORTHOPEDICS    I have placed these orders in the system for you.  Please be sure to give [place] a call if you have not heard from our office regarding scheduling a test or regarding referral in a timely manner.  It is very important that you let me know as soon as possible.      Follow up in one month with Dr Aundra Dubin

## 2017-08-21 NOTE — Telephone Encounter (Signed)
Patient returned your call. Please advise.  

## 2017-08-21 NOTE — Telephone Encounter (Signed)
Patient advised of below and verbalized understanding.  He would like to extend  his gratitude to you for your outreach in this matter.

## 2017-08-21 NOTE — Telephone Encounter (Signed)
See other telephone encounter for documentation.

## 2017-08-21 NOTE — Telephone Encounter (Signed)
Call pt  I was able to get another ID physician to look at labs.   Since AFB are negative x 3, we would not treat him as active TB.  Quantiferon Gold ( this was the lab I had questioned) was indeterminate per ID.   I will also let him know when I hear from [place].

## 2017-08-21 NOTE — Telephone Encounter (Signed)
Spoke with fitzgerald's office   I left message for him; he is out of the office until 08/31/17  Mclean,   Do you feel confident that the TB is an absolute negative. It was the Quantiferon gold that I wanted to be sure of.  He has had 3 negative AFBs  KRISTEN, call pt and let him know I spoke with Fitzgerald's office. He is the only ID in town so there is no one one call ( I told him I thought there might be)  Please tell him we have sent a priority message through his nurse as we wont confirmation that no further TB work up is needed.   Please apologize as I know he wanted a firm answer today. I will call as soon as I hear from Dr Ola Spurr

## 2017-08-21 NOTE — Telephone Encounter (Signed)
Copied from [place]. Topic: Quick Communication - Office Called Patient >> Aug 21, 2017  4:20 PM Johna Sheriff, [place] wrote: Reason for CRM: left message for patient to call office thanks >> Aug 21, 2017  4:35 PM Cleaster Corin, NT wrote: Pt. Returning [employer]. Let pt. Know that he will get a callback.

## 2017-08-21 NOTE — Assessment & Plan Note (Signed)
At baseline. Of note, Low risk for DVT based on well's criteria.  Continues to follow with wound care.  Long discussion with patient as he would like a consult with orthopedics regarding implications of his plate and screws which were placed in the 52s.  Referral has been placed.

## 2017-08-21 NOTE — Telephone Encounter (Signed)
Patient advised of below and verbalized understanding.  

## 2017-08-21 NOTE — Assessment & Plan Note (Addendum)
I am quite pleased with the clinical improvement of patient.sa02 97%.   He has an appointment with pulmonology scheduled for repeat CT chest  and also an appointment wound care today for ongoing wound treatment.  I reviewed at length with the patient that he had 3 negative AFBs.  Also reviewed with him his QuantiFERON gold however as explained today in length today, I felt less comfortable interpreting this myself and I will be consulting infectious disease, Dr. Ola Spurr ,  To ensure a a final negative conclusion regarding TB.  Per Dr. Blane Ohara note, he  did not mention Quantiferon Gold and suspected post influenza PNA.  Of note, patient has minimal risk factors for tuberculosis, and as I explained to patient, clinical suspicion for tuberculosis is low.  We also discussed his elevated sed rate in the hospital and the multitude of etiologies for this nonspecific marker including wound, pneumonia.  At this time we jointly agreed that repeating the value is of  little clinical significance as patient has had clinical improvement.  He will let me know if he does not continue to do so

## 2017-08-23 NOTE — Progress Notes (Signed)
DOMIQUE, CLAPPER ([id number]) Visit Report for 08/21/2017 Chief Complaint Document Details Patient Name: [name], [name] Date of Service: 08/21/2017 2:45 PM Medical Record Number: [id number] Patient Account Number: [id number] Date of Birth/Sex: [birth year] (57 y.o. M) Treating RN: Montey Hora Primary Care Provider: [name] Other Clinician: Referring Provider: [name] Treating Provider/Extender: [name], [name] Weeks in Treatment: 9 Information Obtained from: Patient Chief Complaint He is here in follow up for lle venous ulcers Electronic Signature(s) Signed: 08/23/2017 11:36:05 PM By: Worthy Keeler PA-C Entered By: Worthy Keeler on 08/21/2017 14:14:20 [name], [name] ([id number]) -------------------------------------------------------------------------------- HPI Details Patient Name: [name], [name] Date of Service: 08/21/2017 2:45 PM Medical Record Number: [id number] Patient Account Number: [id number] Date of Birth/Sex: 10-24-1960 (57 y.o. M) Treating RN: Montey Hora Primary Care Provider: [name] Other Clinician: Referring Provider: [name] Treating Provider/Extender: [name], [name] Weeks in Treatment: 9 History of Present Illness HPI Description: 06/25/17-he is here in follow-up evaluation for left lower extremity, medial malleolus, venous ulcers. He is compliant with compression stocking where although his compression stocking does not fit appropriately in length. He has been advised to contact elastic therapy, who measured and provided his current stockings, regarding a more appropriate fitted stocking. He is voicing no complaints or concerns, tolerated debridement. There is improvement in appearance, no significant change in measurements. We will continue with Santyl ointment, will apply to all 3 wounds. He will follow-up next week 06/30/17-he is here earlier than his scheduled appointment on Friday, per his request, secondary  to new open areas. After inquiry, it appears that the new superficial open areas are secondary to adhesive removal. There is no evidence of infection, minimal/scant drainage, most of the new areas are epithelialized. His chronic wounds are stable in appearance, will continue with Santyl. He wants to maintain Friday appointment and will follow-up next Friday, he was encouraged to contact clinic with any ulcer changes or concerns 07/24/17 On evaluation today patient appears to be doing well in regard to left lower extremity ulcer. He has been tolerating the dressing changes without complication. He still continues to use the Se[place]ert which does seem to be helpful for him this is excellent news. Definitely no evidence of infection today which is good news. Overall I'm pleased with how things are going. Patient likewise is also pleased. 07/31/17 on evaluation today patient's wound on the left medial lower extremity appears to be doing okay although he is having a little bit of maceration noted at this point. Obviously this may be due to the drainage coupled with the fact we have been using the Santyl and trying to keep this voice. Obviously it looks as if the wound is a little bit too macerated at this time. Fortunately there does not appear to be any evidence of infection which is good news. No fevers, chills, nausea, or vomiting noted at this time. 08/21/17 on evaluation today patient appears to be doing very well in regard to his left lower extremity ulcers. In fact one of the two these have completely healed at this point. The other is doing much better it is smaller and there is good epithelialization no significant adherent slough noted at this point. He has been tolerating the dressing changes without complication. There does not appear to be any evidence of infection. Unfortunately since I last saw him he has been in the hospital due to pneumonia. The good news is he seems to be improving following  his hospital stay he still seems a  little bit fatigued in my opinion he states that is indeed the case. Overall the wound does appear to be better. Electronic Signature(s) Signed: 08/23/2017 11:36:05 PM By: Worthy Keeler PA-C Entered By: Worthy Keeler on 08/21/2017 16:11:16 [name] ([id number]) -------------------------------------------------------------------------------- Physical Exam Details Patient Name: [name], [name] Date of Service: 08/21/2017 2:45 PM Medical Record Number: [id number] Patient Account Number: [id number] Date of Birth/Sex: 08-12-1960 (57 y.o. M) Treating RN: Montey Hora Primary Care Provider: [name] Other Clinician: Referring Provider: [name] Treating Provider/Extender: STONE III, [name] Weeks in Treatment: 9 Constitutional Well-nourished and well-hydrated in no acute distress. Respiratory normal breathing without difficulty. clear to auscultation bilaterally. Cardiovascular regular rate and rhythm with normal S1, S2. 1+ pitting edema of the bilateral lower extremities. Psychiatric this patient is able to make decisions and demonstrates good insight into disease process. Alert and Oriented x 3. pleasant and cooperative. Notes At this time patient's wound bed shows excellent granulation I was able to mechanically debride the wound with saline and gauze and he tolerated this without complication. Fortunately this was all that was needed no sharp debridement was required. Electronic Signature(s) Signed: 08/23/2017 11:36:05 PM By: Worthy Keeler PA-C Entered By: Worthy Keeler on 08/21/2017 16:12:13 [name] ([id number]) -------------------------------------------------------------------------------- Physician Orders Details Patient Name: Delucia, Dat C. Date of Service: 08/21/2017 2:45 PM Medical Record Number: [id number] Patient Account Number: [id number] Date of Birth/Sex: 1960-06-06 (57 y.o. M) Treating RN: Montey Hora Primary Care Provider: [name] Other Clinician: Referring Provider: [name] Treating Provider/Extender: [name], [name] Weeks in Treatment: 9 Verbal / Phone Orders: No Diagnosis Coding ICD-10 Coding Code Description I89.0 Lymphedema, not elsewhere classified L97.822 Non-pressure chronic ulcer of other part of left lower leg with fat layer exposed E66.09 Other obesity due to excess calories Wound Cleansing Wound #1 Left,Proximal,Medial Lower Leg o Clean wound with Normal Saline. Wound #2 Left,Distal,Medial Lower Leg o Clean wound with Normal Saline. Anesthetic (add to Medication List) Wound #1 Left,Proximal,Medial Lower Leg o Topical Lidocaine 4% cream applied to wound bed prior to debridement (In Clinic Only). Wound #2 Left,Distal,Medial Lower Leg o Topical Lidocaine 4% cream applied to wound bed prior to debridement (In Clinic Only). Primary Wound Dressing Wound #1 Left,Proximal,Medial Lower Leg o Silver Collagen Wound #2 Left,Distal,Medial Lower Leg o Silver Collagen Secondary Dressing Wound #1 Left,Proximal,Medial Lower Leg o Boardered Foam Dressing Wound #2 Left,Distal,Medial Lower Leg o Boardered Foam Dressing Dressing Change Frequency Wound #1 Left,Proximal,Medial Lower Leg o Change dressing every other day. Wound #2 Left,Distal,Medial Lower Leg o Change dressing every other day. Follow-up Appointments [name] ([id number]) Wound #1 Left,Proximal,Medial Lower Leg o Return Appointment in 1 week. Wound #2 Left,Distal,Medial Lower Leg o Return Appointment in 1 week. Edema Control Wound #1 Left,Proximal,Medial Lower Leg o Patient to wear own compression stockings Wound #2 Left,Distal,Medial Lower Leg o Patient to wear own compression stockings Electronic Signature(s) Signed: 08/21/2017 4:19:12 PM By: Montey Hora Signed: 08/23/2017 11:36:05 PM By: Worthy Keeler PA-C Entered By: Montey Hora on  08/21/2017 15:24:12 Lagos, Zamere C. ([id number]) -------------------------------------------------------------------------------- Problem List Details Patient Name: Lear, Dior C. Date of Service: 08/21/2017 2:45 PM Medical Record Number: [id number] Patient Account Number: [id number] Date of Birth/Sex: 1960/11/30 (57 y.o. M) Treating RN: Montey Hora Primary Care Provider: [name] Other Clinician: Referring Provider: [name] Treating Provider/Extender: [name], [name] Weeks in Treatment: 9 Active Problems ICD-10 Impacting Encounter Code Description Active Date Wound Healing Diagnosis I89.0 Lymphedema, not elsewhere classified 06/22/2017  Yes [id number] Non-pressure chronic ulcer of other part of left lower leg with 06/22/2017 Yes fat layer exposed E66.09 Other obesity due to excess calories 06/22/2017 Yes Inactive Problems Resolved Problems Electronic Signature(s) Signed: 08/23/2017 11:36:05 PM By: Worthy Keeler PA-C Entered By: Worthy Keeler on 08/21/2017 14:14:11 Remley, Ioan C. ([id number]) -------------------------------------------------------------------------------- Progress Note Details Patient Name: Harton, Breken C. Date of Service: 08/21/2017 2:45 PM Medical Record Number: [id number] Patient Account Number: [id number] Date of Birth/Sex: 01-Apr-1961 (56 y.o. M) Treating RN: Montey Hora Primary Care Provider: [name] Other Clinician: Referring Provider: [name] Treating Provider/Extender: [name], [name] Weeks in Treatment: 9 Subjective Chief Complaint Information obtained from Patient He is here in follow up for lle venous ulcers History of Present Illness (HPI) 06/25/17-he is here in follow-up evaluation for left lower extremity, medial malleolus, venous ulcers. He is compliant with compression stocking where although his compression stocking does not fit appropriately in length. He has been advised to contact elastic  therapy, who measured and provided his current stockings, regarding a more appropriate fitted stocking. He is voicing no complaints or concerns, tolerated debridement. There is improvement in appearance, no significant change in measurements. We will continue with Santyl ointment, will apply to all 3 wounds. He will follow-up next week 06/30/17-he is here earlier than his scheduled appointment on Friday, per his request, secondary to new open areas. After inquiry, it appears that the new superficial open areas are secondary to adhesive removal. There is no evidence of infection, minimal/scant drainage, most of the new areas are epithelialized. His chronic wounds are stable in appearance, will continue with Santyl. He wants to maintain Friday appointment and will follow-up next Friday, he was encouraged to contact clinic with any ulcer changes or concerns 07/24/17 On evaluation today patient appears to be doing well in regard to left lower extremity ulcer. He has been tolerating the dressing changes without complication. He still continues to use the [place] which does seem to be helpful for him this is excellent news. Definitely no evidence of infection today which is good news. Overall I'm pleased with how things are going. Patient likewise is also pleased. 07/31/17 on evaluation today patient's wound on the left medial lower extremity appears to be doing okay although he is having a little bit of maceration noted at this point. Obviously this may be due to the drainage coupled with the fact we have been using the Santyl and trying to keep this voice. Obviously it looks as if the wound is a little bit too macerated at this time. Fortunately there does not appear to be any evidence of infection which is good news. No fevers, chills, nausea, or vomiting noted at this time. 08/21/17 on evaluation today patient appears to be doing very well in regard to his left lower extremity ulcers. In fact one of  the two these have completely healed at this point. The other is doing much better it is smaller and there is good epithelialization no significant adherent slough noted at this point. He has been tolerating the dressing changes without complication. There does not appear to be any evidence of infection. Unfortunately since I last saw him he has been in the hospital due to pneumonia. The good news is he seems to be improving following his hospital stay he still seems a little bit fatigued in my opinion he states that is indeed the case. Overall the wound does appear to be better. Patient History Information obtained from Patient. Family History Cancer -  Father, Heart Disease - Father,Siblings, No family history of Diabetes, Hereditary Spherocytosis, Hypertension, Kidney Disease, Lung Disease, Seizures, Stroke, Thyroid Problems, Tuberculosis. Social History Current some day smoker, Marital Status - Separated, Alcohol Use - Rarely, Drug Use - No History, Caffeine Use - Daily. Printy, Aydrien C. ([id number]) Review of Systems (ROS) Constitutional Symptoms (General Health) Denies complaints or symptoms of Fever, Chills. Respiratory The patient has no complaints or symptoms. Cardiovascular The patient has no complaints or symptoms. Psychiatric The patient has no complaints or symptoms. Objective Constitutional Well-nourished and well-hydrated in no acute distress. Vitals Time Taken: 2:54 PM, Height: 75 in, Weight: 179 lbs, BMI: 22.4, Temperature: 98.0 F, Pulse: 84 bpm, Respiratory Rate: 18 breaths/min, Blood Pressure: 130/81 mmHg. Respiratory normal breathing without difficulty. clear to auscultation bilaterally. Cardiovascular regular rate and rhythm with normal S1, S2. 1+ pitting edema of the bilateral lower extremities. Psychiatric this patient is able to make decisions and demonstrates good insight into disease process. Alert and Oriented x 3. pleasant and cooperative. General Notes: At  this time patient's wound bed shows excellent granulation I was able to mechanically debride the wound with saline and gauze and he tolerated this without complication. Fortunately this was all that was needed no sharp debridement was required. Integumentary (Hair, Skin) Wound #1 status is Open. Original cause of wound was Gradually Appeared. The wound is located on the Left,Proximal,Medial Lower Leg. The wound measures 0.1cm length x 0.1cm width x 0.1cm depth; 0.008cm^2 area and 0.001cm^3 volume. There is no tunneling or undermining noted. There is a large amount of serosanguineous drainage noted. There is no granulation within the wound bed. There is a large (67-100%) amount of necrotic tissue within the wound bed including Eschar and Adherent Slough. Periwound temperature was noted as No Abnormality. The periwound has tenderness on palpation. Wound #2 status is Open. Original cause of wound was Gradually Appeared. The wound is located on the Left,Distal,Medial Lower Leg. The wound measures 1.1cm length x 0.9cm width x 0.1cm depth; 0.778cm^2 area and 0.078cm^3 volume. There is Fat Layer (Subcutaneous Tissue) Exposed exposed. There is no tunneling or undermining noted. There is a large amount of serosanguineous drainage noted. The wound margin is distinct with the outline attached to the wound base. There is medium (34-66%) red granulation within the wound bed. There is a medium (34-66%) amount of necrotic tissue within the wound bed including Adherent Slough. The periwound skin appearance exhibited: Scarring. Periwound temperature was noted as No Abnormality. The periwound has tenderness on palpation. [name] ([id number]) Assessment Active Problems ICD-10 I89.0 - Lymphedema, not elsewhere classified L97.822 - Non-pressure chronic ulcer of other part of left lower leg with fat layer exposed E66.09 - Other obesity due to excess calories Plan Wound Cleansing: Wound #1  Left,Proximal,Medial Lower Leg: Clean wound with Normal Saline. Wound #2 Left,Distal,Medial Lower Leg: Clean wound with Normal Saline. Anesthetic (add to Medication List): Wound #1 Left,Proximal,Medial Lower Leg: Topical Lidocaine 4% cream applied to wound bed prior to debridement (In Clinic Only). Wound #2 Left,Distal,Medial Lower Leg: Topical Lidocaine 4% cream applied to wound bed prior to debridement (In Clinic Only). Primary Wound Dressing: Wound #1 Left,Proximal,Medial Lower Leg: Silver Collagen Wound #2 Left,Distal,Medial Lower Leg: Silver Collagen Secondary Dressing: Wound #1 Left,Proximal,Medial Lower Leg: Boardered Foam Dressing Wound #2 Left,Distal,Medial Lower Leg: Boardered Foam Dressing Dressing Change Frequency: Wound #1 Left,Proximal,Medial Lower Leg: Change dressing every other day. Wound #2 Left,Distal,Medial Lower Leg: Change dressing every other day. Follow-up Appointments: Wound #1 Left,Proximal,Medial Lower Leg: Return  Appointment in 1 week. Wound #2 Left,Distal,Medial Lower Leg: Return Appointment in 1 week. Edema Control: Wound #1 Left,Proximal,Medial Lower Leg: Patient to wear own compression stockings Wound #2 Left,Distal,Medial Lower Leg: Patient to wear own compression stockings [name], [name] ([id number]) I'm going to suggest at this point that we continue with the Current wound care measures as the patient seems to be doing very well with this. He's in agreement with plan. We will see were things stand in one weeks time we see him for reevaluation. Please see above for specific wound care orders. We will see patient for re-evaluation in 1 week(s) here in the clinic. If anything worsens or changes patient will contact our office for additional recommendations. Electronic Signature(s) Signed: 08/23/2017 11:36:05 PM By: Worthy Keeler PA-C Entered By: Worthy Keeler on 08/21/2017 16:13:02 [name]  ([id number]) -------------------------------------------------------------------------------- ROS/PFSH Details Patient Name: Bartolomei, [name] Date of Service: 08/21/2017 2:45 PM Medical Record Number: [id number] Patient Account Number: [id number] Date of Birth/Sex: 10-Jun-1960 (57 y.o. M) Treating RN: Montey Hora Primary Care Provider: [name] Other Clinician: Referring Provider: [name] Treating Provider/Extender: [name], [name] Weeks in Treatment: 9 Information Obtained From Patient Wound History Do you currently have one or more open woundso Yes How many open wounds do you currently haveo 3 Approximately how long have you had your woundso several months How have you been treating your wound(s) until nowo xeroform gauze Has your wound(s) ever healed and then re-openedo No Have you had any lab work done in the past montho No Have you tested positive for an antibiotic resistant organism (MRSA, VRE)o No Have you tested positive for osteomyelitis (bone infection)o No Have you had any tests for circulation on your legso No Constitutional Symptoms (General Health) Complaints and Symptoms: Negative for: Fever; Chills Eyes Medical History: Negative for: Cataracts; Glaucoma; Optic Neuritis Hematologic/Lymphatic Medical History: Negative for: Anemia; Hemophilia; Human Immunodeficiency Virus; Lymphedema; Sickle Cell Disease Respiratory Complaints and Symptoms: No Complaints or Symptoms Medical History: Positive for: Sleep Apnea Negative for: Aspiration; Asthma; Chronic Obstructive Pulmonary Disease (COPD); Pneumothorax; Tuberculosis Cardiovascular Complaints and Symptoms: No Complaints or Symptoms Medical History: Negative for: Arrhythmia; Congestive Heart Failure; Coronary Artery Disease; Deep Vein Thrombosis; Hypertension; Hypotension; Myocardial Infarction; Peripheral Arterial Disease; Peripheral Venous Disease; Phlebitis;  Vasculitis Gastrointestinal [name], [name] ([id number]) Medical History: Negative for: Cirrhosis ; Colitis; Crohnos; Hepatitis A; Hepatitis B; Hepatitis C Endocrine Medical History: Negative for: Type I Diabetes; Type II Diabetes Genitourinary Medical History: Negative for: End Stage Renal Disease Immunological Medical History: Negative for: Lupus Erythematosus; Raynaudos; Scleroderma Integumentary (Skin) Medical History: Negative for: History of Burn Musculoskeletal Medical History: Negative for: Gout; Rheumatoid Arthritis; Osteoarthritis; Osteomyelitis Neurologic Medical History: Negative for: Dementia; Neuropathy; Paraplegia; Seizure Disorder Psychiatric Complaints and Symptoms: No Complaints or Symptoms Medical History: Positive for: Confinement Anxiety Negative for: Anorexia/bulimia Immunizations Pneumococcal Vaccine: Received Pneumococcal Vaccination: No Implantable Devices Family and Social History Cancer: Yes - Father; Diabetes: No; Heart Disease: Yes - Father,Siblings; Hereditary Spherocytosis: No; Hypertension: No; Kidney Disease: No; Lung Disease: No; Seizures: No; Stroke: No; Thyroid Problems: No; Tuberculosis: No; Current some day smoker; Marital Status - Separated; Alcohol Use: Rarely; Drug Use: No History; Caffeine Use: Daily; Financial Concerns: No; Food, Clothing or Shelter Needs: No; Support System Lacking: No; Transportation Concerns: No; Advanced Directives: No; Patient does not want information on Advanced Directives; Do not resuscitate: No; Living Will: No; Medical Power of Attorney: No Physician Affirmation I have reviewed and agree with the above information. [name],  [name] ([id number]) Electronic Signature(s) Signed: 08/21/2017 4:19:12 PM By: Montey Hora Signed: 08/23/2017 11:36:05 PM By: Worthy Keeler PA-C Entered By: Worthy Keeler on 08/21/2017 16:11:38 [name], Jayesh C.  ([id number]) -------------------------------------------------------------------------------- SuperBill Details Patient Name: Gasper, Dre C. Date of Service: 08/21/2017 Medical Record Number: [id number] Patient Account Number: [id number] Date of Birth/Sex: 1961/03/04 (57 y.o. M) Treating RN: Montey Hora Primary Care Provider: [name] Other Clinician: Referring Provider: [name] Treating Provider/Extender: [name], [name] Weeks in Treatment: 9 Diagnosis Coding ICD-10 Codes Code Description I89.0 Lymphedema, not elsewhere classified L97.822 Non-pressure chronic ulcer of other part of left lower leg with fat layer exposed E66.09 Other obesity due to excess calories Facility Procedures CPT4 Code: [id number] Description: 99214 - WOUND CARE VISIT-LEV 4 EST PT Modifier: Quantity: 1 Physician Procedures CPT4 Code Description: [id number] 99213 - WC PHYS LEVEL 3 - EST PT ICD-10 Diagnosis Description I89.0 Lymphedema, not elsewhere classified L97.822 Non-pressure chronic ulcer of other part of left lower leg wit E66.09 Other obesity due to excess calories Modifier: h fat layer expos Quantity: 1 ed Electronic Signature(s) Signed: 08/23/2017 11:36:05 PM By: Worthy Keeler PA-C Entered By: Worthy Keeler on 08/21/2017 16:13:14

## 2017-08-24 ENCOUNTER — Telehealth: Payer: Self-pay | Admitting: Family

## 2017-08-24 ENCOUNTER — Encounter: Payer: Self-pay | Admitting: Family

## 2017-08-24 ENCOUNTER — Telehealth: Payer: Self-pay | Admitting: Licensed Clinical Social Worker

## 2017-08-24 NOTE — Telephone Encounter (Signed)
Forms in the physician's folder at registration to be filled out.

## 2017-08-24 NOTE — Progress Notes (Signed)
ZEVEN, KOCAK ([id number]) Visit Report for 08/21/2017 Arrival Information Details Patient Name: [name]. Date of Service: 08/21/2017 2:45 PM Medical Record Number: [id number] Patient Account Number: [id number] Date of Birth/Sex: 04/02/1961 (57 y.o. M) Treating RN: Roger Shelter Primary Care Abdias Hickam: [name] Other Clinician: Referring Vishruth Seoane: [name] Treating Janvi Ammar/Extender: [name], [name] Weeks in Treatment: 9 Visit Information History Since Last Visit All ordered tests and consults were completed: No Patient Arrived: Ambulatory Any new allergies or adverse reactions: No Arrival Time: 14:53 Had a fall or experienced change in No Accompanied By: self activities of daily living that may affect Transfer Assistance: None risk of falls: Patient Identification Verified: Yes Signs or symptoms of abuse/neglect since last visito No Secondary Verification Process Completed: Yes Hospitalized since last visit: No Patient Requires Transmission-Based No Pain Present Now: No Precautions: Patient Has Alerts: No Electronic Signature(s) Signed: 08/24/2017 4:23:05 PM By: Roger Shelter Entered By: Roger Shelter on 08/21/2017 14:54:23 Fenster, Geoge [name]. ([id number]) -------------------------------------------------------------------------------- Clinic Level of Care Assessment Details Patient Name: [name]. Date of Service: 08/21/2017 2:45 PM Medical Record Number: [id number] Patient Account Number: [id number] Date of Birth/Sex: 10-19-1960 (57 y.o. M) Treating RN: Montey Hora Primary Care -Anne Flicker: [name] Other Clinician: Referring Milderd Manocchio: [name] Treating Corina Stacy/Extender: [name], [name] Weeks in Treatment: 9 Clinic Level of Care Assessment Items TOOL 4 Quantity Score []  - Use when only an EandM is performed on FOLLOW-UP visit 0 ASSESSMENTS - Nursing Assessment / Reassessment X - Reassessment of Co-morbidities  (includes updates in patient status) 1 10 X- 1 5 Reassessment of Adherence to Treatment Plan ASSESSMENTS - Wound and Skin Assessment / Reassessment []  - Simple Wound Assessment / Reassessment - one wound 0 X- 3 5 Complex Wound Assessment / Reassessment - multiple wounds []  - 0 Dermatologic / Skin Assessment (not related to wound area) ASSESSMENTS - Focused Assessment X - Circumferential Edema Measurements - multi extremities 1 5 []  - 0 Nutritional Assessment / Counseling / Intervention X- 1 5 Lower Extremity Assessment (monofilament, tuning fork, pulses) []  - 0 Peripheral Arterial Disease Assessment (using hand held doppler) ASSESSMENTS - Ostomy and/or Continence Assessment and Care []  - Incontinence Assessment and Management 0 []  - 0 Ostomy Care Assessment and Management (repouching, etc.) PROCESS - Coordination of Care X - Simple Patient / Family Education for ongoing care 1 15 []  - 0 Complex (extensive) Patient / Family Education for ongoing care []  - 0 Staff obtains Programmer, systems, Records, Test Results / Process Orders []  - 0 Staff telephones HHA, Nursing Homes / Clarify orders / etc []  - 0 Routine Transfer to another Facility (non-emergent condition) []  - 0 Routine Hospital Admission (non-emergent condition) []  - 0 New Admissions / Biomedical engineer / Ordering NPWT, Apligraf, etc. []  - 0 Emergency Hospital Admission (emergent condition) X- 1 10 Simple Discharge Coordination [name]. ([id number]) []  - 0 Complex (extensive) Discharge Coordination PROCESS - Special Needs []  - Pediatric / Minor Patient Management 0 []  - 0 Isolation Patient Management []  - 0 Hearing / Language / Visual special needs []  - 0 Assessment of Community assistance (transportation, D/[name] planning, etc.) []  - 0 Additional assistance / Altered mentation []  - 0 Support Surface(s) Assessment (bed, cushion, seat, etc.) INTERVENTIONS - Wound Cleansing / Measurement []  - Simple Wound  Cleansing - one wound 0 X- 3 5 Complex Wound Cleansing - multiple wounds X- 1 5 Wound Imaging (photographs - any number of wounds) []  - 0 Wound Tracing (instead of photographs) []  -  0 Simple Wound Measurement - one wound X- 3 5 Complex Wound Measurement - multiple wounds INTERVENTIONS - Wound Dressings X - Small Wound Dressing one or multiple wounds 2 10 []  - 0 Medium Wound Dressing one or multiple wounds []  - 0 Large Wound Dressing one or multiple wounds []  - 0 Application of Medications - topical []  - 0 Application of Medications - injection INTERVENTIONS - Miscellaneous []  - External ear exam 0 []  - 0 Specimen Collection (cultures, biopsies, blood, body fluids, etc.) []  - 0 Specimen(s) / Culture(s) sent or taken to Lab for analysis []  - 0 Patient Transfer (multiple staff / Civil Service fast streamer / Similar devices) []  - 0 Simple Staple / Suture removal (25 or less) []  - 0 Complex Staple / Suture removal (26 or more) []  - 0 Hypo / Hyperglycemic Management (close monitor of Blood Glucose) []  - 0 Ankle / Brachial Index (ABI) - do not check if billed separately X- 1 5 Vital Signs [name]. ([id number]) Has the patient been seen at the hospital within the last three years: Yes Total Score: 125 Level Of Care: New/Established - Level 4 Electronic Signature(s) Signed: 08/21/2017 4:19:12 PM By: Montey Hora Entered By: Montey Hora on 08/21/2017 15:24:59 [name]. ([id number]) -------------------------------------------------------------------------------- Encounter Discharge Information Details Patient Name: [name]. Date of Service: 08/21/2017 2:45 PM Medical Record Number: [id number] Patient Account Number: [id number] Date of Birth/Sex: 1961-03-01 (57 y.o. M) Treating RN: Montey Hora Primary Care Ronelle Smallman: [name] Other Clinician: Referring Caidance Sybert: [name] Treating Leevon Upperman/Extender: [name], [name] Weeks in Treatment: 9 Encounter  Discharge Information Items Discharge Pain Level: 0 Discharge Condition: Stable Ambulatory Status: Ambulatory Discharge Destination: Home Transportation: Private Auto Accompanied By: self Schedule Follow-up Appointment: Yes Medication Reconciliation completed and No provided to Patient/Care Danyetta Gillham: Provided on Clinical Summary of Care: 08/21/2017 Form Type Recipient Paper Patient LF Electronic Signature(s) Signed: 08/21/2017 4:19:12 PM By: Montey Hora Entered By: Montey Hora on 08/21/2017 15:29:36 [name]. ([id number]) -------------------------------------------------------------------------------- Lower Extremity Assessment Details Patient Name: Waldroup, Tavi [name]. Date of Service: 08/21/2017 2:45 PM Medical Record Number: [id number] Patient Account Number: [id number] Date of Birth/Sex: May 13, 1961 (57 y.o. M) Treating RN: Roger Shelter Primary Care Kazzandra Desaulniers: [name] Other Clinician: Referring Celesta Funderburk: [name] Treating Analuisa Tudor/Extender: [name], [name] Weeks in Treatment: 9 Edema Assessment Assessed: [Left: No] [Right: No] [Left: Edema] [Right: :] Calf Left: Right: Point of Measurement: 37 cm From Medial Instep 40.9 cm cm Ankle Left: Right: Point of Measurement: 12 cm From Medial Instep 28.2 cm cm Vascular Assessment Claudication: Claudication Assessment [Left:None] Pulses: Dorsalis Pedis Palpable: [Left:Yes] Posterior Tibial Extremity colors, hair growth, and conditions: Extremity Color: [Left:Normal] Hair Growth on Extremity: [Left:No] Temperature of Extremity: [Left:Warm] Capillary Refill: [Left:< 3 seconds] Toe Nail Assessment Left: Right: Thick: No Discolored: No Deformed: No Improper Length and Hygiene: No Electronic Signature(s) Signed: 08/24/2017 4:23:05 PM By: Roger Shelter Entered By: Roger Shelter on 08/21/2017 15:05:56 Oldfield, Dashawn [name].  ([id number]) -------------------------------------------------------------------------------- Multi Wound Chart Details Patient Name: Artola, Tarell [name]. Date of Service: 08/21/2017 2:45 PM Medical Record Number: [id number] Patient Account Number: [id number] Date of Birth/Sex: Jan 06, 1961 (57 y.o. M) Treating RN: Montey Hora Primary Care Yardley Beltran: [name] Other Clinician: Referring Hideko Esselman: [name] Treating Khrystian Schauf/Extender: [name], [name] Weeks in Treatment: 9 Vital Signs Height(in): 75 Pulse(bpm): 84 Weight(lbs): 179 Blood Pressure(mmHg): 130/81 Body Mass Index(BMI): 22 Temperature(F): 98.0 Respiratory Rate 18 (breaths/min): Photos: [1:No Photos] [2:No Photos] [N/A:N/A] Wound Location: [1:Left Lower Leg - Medial, Proximal] [2:Left Lower Leg -  Medial, Distal N/A] Wounding Event: [1:Gradually Appeared] [2:Gradually Appeared] [N/A:N/A] Primary Etiology: [1:Lymphedema] [2:Lymphedema] [N/A:N/A] Comorbid History: [1:Sleep Apnea, Confinement Anxiety] [2:Sleep Apnea, Confinement Anxiety] [N/A:N/A] Date Acquired: [1:01/17/2017] [2:01/17/2017] [N/A:N/A] Weeks of Treatment: [1:9] [2:9] [N/A:N/A] Wound Status: [1:Open] [2:Open] [N/A:N/A] Measurements L x W x D [1:0.1x0.1x0.1] [2:1.1x0.9x0.1] [N/A:N/A] (cm) Area (cm) : [2:7.782] [2:0.778] [N/A:N/A] Volume (cm) : [1:0.001] [2:0.078] [N/A:N/A] % Reduction in Area: [1:98.80%] [2:84.80%] [N/A:N/A] % Reduction in Volume: [1:98.60%] [2:84.70%] [N/A:N/A] Classification: [1:Full Thickness Without Exposed Support Structures] [2:Full Thickness Without Exposed Support Structures] [N/A:N/A] Exudate Amount: [1:Large] [2:Large] [N/A:N/A] Exudate Type: [1:Serosanguineous] [2:Serosanguineous] [N/A:N/A] Exudate Color: [1:red, brown] [2:red, brown] [N/A:N/A] Wound Margin: [1:N/A] [2:Distinct, outline attached] [N/A:N/A] Granulation Amount: [1:None Present (0%)] [2:Medium (34-66%)] [N/A:N/A] Granulation Quality: [1:N/A]  [2:Red] [N/A:N/A] Necrotic Amount: [1:Large (67-100%)] [2:Medium (34-66%)] [N/A:N/A] Necrotic Tissue: [1:Eschar, Adherent Slough] [2:Adherent Slough] [N/A:N/A] Epithelialization: [1:None] [2:Small (1-33%)] [N/A:N/A] Periwound Skin Texture: [1:No Abnormalities Noted] [2:Scarring: Yes] [N/A:N/A] Periwound Skin Moisture: [1:No Abnormalities Noted] [2:No Abnormalities Noted] [N/A:N/A] Periwound Skin Color: [1:No Abnormalities Noted] [2:No Abnormalities Noted] [N/A:N/A] Temperature: [1:No Abnormality] [2:No Abnormality] [N/A:N/A] Tenderness on Palpation: [1:Yes] [2:Yes] [N/A:N/A] Wound Preparation: [1:Ulcer Cleansing: Rinsed/Irrigated with Saline] [2:Ulcer Cleansing: Rinsed/Irrigated with Saline] [N/A:N/A] Topical Anesthetic Applied: Topical Anesthetic Applied: Other: lidocaine 4% Other: lidocaine 4% JERMEY, CLOSS ([id number]) Treatment Notes Electronic Signature(s) Signed: 08/21/2017 4:19:12 PM By: Montey Hora Entered By: Montey Hora on 08/21/2017 15:23:03 [name], [name] Loletha Grayer ([id number]) -------------------------------------------------------------------------------- [place] Details Patient Name: [name]. Date of Service: 08/21/2017 2:45 PM Medical Record Number: [id number] Patient Account Number: [id number] Date of Birth/Sex: 29-Aug-1960 (57 y.o. M) Treating RN: Montey Hora Primary Care Breeana Sawtelle: [name] Other Clinician: Referring Sadeel Fiddler: [name] Treating Kolbey Teichert/Extender: [name], [name] Weeks in Treatment: 9 Active Inactive ` Orientation to the Wound Care Program Nursing Diagnoses: Knowledge deficit related to the wound healing center program Goals: Patient/caregiver will verbalize understanding of the [place] Program Date Initiated: 06/19/2017 Target Resolution Date: 07/10/2017 Goal Status: Active Interventions: Provide education on orientation to the wound center Notes: ` Wound/Skin Impairment Nursing  Diagnoses: Impaired tissue integrity Goals: Patient/caregiver will verbalize understanding of skin care regimen Date Initiated: 06/19/2017 Target Resolution Date: 07/10/2017 Goal Status: Active Ulcer/skin breakdown will have a volume reduction of 30% by week 4 Date Initiated: 06/19/2017 Target Resolution Date: 07/10/2017 Goal Status: Active Interventions: Assess patient/caregiver ability to obtain necessary supplies Assess patient/caregiver ability to perform ulcer/skin care regimen upon admission and as needed Assess ulceration(s) every visit Treatment Activities: Skin care regimen initiated : 06/19/2017 Notes: Electronic Signature(s) Signed: 08/21/2017 4:19:12 PM By: Georgena Spurling, Mechele Claude ([id number]) Entered By: Montey Hora on 08/21/2017 15:22:52 Ishii, Danzell [name]. ([id number]) -------------------------------------------------------------------------------- Pain Assessment Details Patient Name: [name]. Date of Service: 08/21/2017 2:45 PM Medical Record Number: [id number] Patient Account Number: [id number] Date of Birth/Sex: 1960/06/23 (57 y.o. M) Treating RN: Roger Shelter Primary Care Wendall Isabell: [name] Other Clinician: Referring Javien Tesch: [name] Treating Hoy Fallert/Extender: [name], [name] Weeks in Treatment: 9 Active Problems Location of Pain Severity and Description of Pain Patient Has Paino No Site Locations Pain Management and Medication Current Pain Management: Electronic Signature(s) Signed: 08/24/2017 4:23:05 PM By: Roger Shelter Entered By: Roger Shelter on 08/21/2017 14:54:30 Arther, [name] Kitchen ([id number]) -------------------------------------------------------------------------------- Patient/Caregiver Education Details Patient Name: [name]. Date of Service: 08/21/2017 2:45 PM Medical Record Number: [id number] Patient Account Number: [id number] Date of Birth/Gender: [birth year] (57 y.o. M) Treating RN: Montey Hora Primary  Care Physician: [name] Other Clinician: Referring Physician: [name] Treating  Physician/Extender: Sharalyn Ink in Treatment: 9 Education Assessment Education Provided To: Patient Education Topics Provided Wound/Skin Impairment: Handouts: Other: wound care as ordered Methods: Demonstration, Explain/Verbal Responses: State content correctly Electronic Signature(s) Signed: 08/21/2017 4:19:12 PM By: Montey Hora Entered By: Montey Hora on 08/21/2017 15:29:55 Konen, [name]. ([id number]) -------------------------------------------------------------------------------- Wound Assessment Details Patient Name: Berntsen, General [name]. Date of Service: 08/21/2017 2:45 PM Medical Record Number: [id number] Patient Account Number: [id number] Date of Birth/Sex: 1960-11-13 (57 y.o. M) Treating RN: Roger Shelter Primary Care Andree Heeg: [name] Other Clinician: Referring Vaanya Shambaugh: [name] Treating Azelia Reiger/Extender: [name], [name] Weeks in Treatment: 9 Wound Status Wound Number: 1 Primary Etiology: Lymphedema Wound Location: Left Lower Leg - Medial, Proximal Wound Status: Open Wounding Event: Gradually Appeared Comorbid History: Sleep Apnea, Confinement Anxiety Date Acquired: 01/17/2017 Weeks Of Treatment: 9 Clustered Wound: No Wound Measurements Length: (cm) 0.1 Width: (cm) 0.1 Depth: (cm) 0.1 Area: (cm) 0.008 Volume: (cm) 0.001 % Reduction in Area: 98.8% % Reduction in Volume: 98.6% Epithelialization: None Tunneling: No Undermining: No Wound Description Full Thickness Without Exposed Support Classification: Structures Exudate Large Amount: Exudate Type: Serosanguineous Exudate Color: red, brown Foul Odor After Cleansing: No Slough/Fibrino Yes Wound Bed Granulation Amount: None Present (0%) Necrotic Amount: Large (67-100%) Necrotic Quality: Eschar, Adherent Slough Periwound Skin Texture Texture Color No  Abnormalities Noted: No No Abnormalities Noted: No Moisture Temperature / Pain No Abnormalities Noted: No Temperature: No Abnormality Tenderness on Palpation: Yes Wound Preparation Ulcer Cleansing: Rinsed/Irrigated with Saline Topical Anesthetic Applied: Other: lidocaine 4%, Treatment Notes Wound #1 (Left, Proximal, Medial Lower Leg) 1. Cleansed with: Clean wound with Normal Saline 2. Anesthetic Topical Lidocaine 4% cream to wound bed prior to debridement Manahan, Jakhari [name]. ([id number]) 4. Dressing Applied: Prisma Ag 5. Secondary Dressing Applied Bordered Foam Dressing Electronic Signature(s) Signed: 08/24/2017 4:23:05 PM By: Roger Shelter Entered By: Roger Shelter on 08/21/2017 15:03:36 Ciotti, Avyan [name]. ([id number]) -------------------------------------------------------------------------------- Wound Assessment Details Patient Name: Bacote, Braylin [name]. Date of Service: 08/21/2017 2:45 PM Medical Record Number: [id number] Patient Account Number: [id number] Date of Birth/Sex: 1960/07/12 (57 y.o. M) Treating RN: Roger Shelter Primary Care Gwendelyn Lanting: [name] Other Clinician: Referring Marta Bouie: [name] Treating Tesla Bochicchio/Extender: [name], [name] Weeks in Treatment: 9 Wound Status Wound Number: 2 Primary Etiology: Lymphedema Wound Location: Left Lower Leg - Medial, Distal Wound Status: Open Wounding Event: Gradually Appeared Comorbid History: Sleep Apnea, Confinement Anxiety Date Acquired: 01/17/2017 Weeks Of Treatment: 9 Clustered Wound: No Wound Measurements Length: (cm) 1.1 Width: (cm) 0.9 Depth: (cm) 0.1 Area: (cm) 0.778 Volume: (cm) 0.078 % Reduction in Area: 84.8% % Reduction in Volume: 84.7% Epithelialization: Small (1-33%) Tunneling: No Undermining: No Wound Description Full Thickness Without Exposed Support Classification: Structures Wound Margin: Distinct, outline attached Exudate Large Amount: Exudate Type:  Serosanguineous Exudate Color: red, brown Foul Odor After Cleansing: No Slough/Fibrino Yes Wound Bed Granulation Amount: Medium (34-66%) Exposed Structure Granulation Quality: Red Fascia Exposed: No Necrotic Amount: Medium (34-66%) Fat Layer (Subcutaneous Tissue) Exposed: Yes Necrotic Quality: Adherent Slough Tendon Exposed: No Muscle Exposed: No Joint Exposed: No Bone Exposed: No Periwound Skin Texture Texture Color No Abnormalities Noted: No No Abnormalities Noted: No Scarring: Yes Temperature / Pain Moisture Temperature: No Abnormality No Abnormalities Noted: No Tenderness on Palpation: Yes Wound Preparation Ulcer Cleansing: Rinsed/Irrigated with Saline Topical Anesthetic Applied: Other: lidocaine 4%, Treatment Notes Snee, Isadore [name]. ([id number]) Wound #2 (Left, Distal, Medial Lower Leg) 1. Cleansed with: Clean wound with Normal Saline 2. Anesthetic Topical Lidocaine 4% cream to wound bed prior  to debridement 4. Dressing Applied: Prisma Ag 5. Secondary Dressing Applied Bordered Foam Dressing Electronic Signature(s) Signed: 08/24/2017 4:23:05 PM By: Roger Shelter Entered By: Roger Shelter on 08/21/2017 15:05:13 Gilmer, Denard [name]. ([id number]) -------------------------------------------------------------------------------- [place] Details Patient Name: Kuipers, Arav [name]. Date of Service: 08/21/2017 2:45 PM Medical Record Number: [id number] Patient Account Number: [id number] Date of Birth/Sex: Jan 18, 1961 (57 y.o. M) Treating RN: Roger Shelter Primary Care Carmellia Kreisler: [name] Other Clinician: Referring Nathanel Tallman: [name] Treating Maika Mcelveen/Extender: [name], [name] Weeks in Treatment: 9 Vital Signs Time Taken: 14:54 Temperature (F): 98.0 Height (in): 75 Pulse (bpm): 84 Weight (lbs): 179 Respiratory Rate (breaths/min): 18 Body Mass Index (BMI): 22.4 Blood Pressure (mmHg): 130/81 Reference Range: 80 - 120 mg / dl Electronic  Signature(s) Signed: 08/24/2017 4:23:05 PM By: Roger Shelter Entered By: Roger Shelter on 08/21/2017 14:56:23

## 2017-08-24 NOTE — Telephone Encounter (Signed)
Called and left brief message regarding Dr. Ola Spurr interpretation of tuberculosis.  See my chart message for detail as didn't leave on phone

## 2017-08-24 NOTE — Telephone Encounter (Signed)
Infectious disease does not recommend treatment for TB at this time  He needs to definitely follow up with pulmonary/lung doctors in the future  Does he want referral now?  Also they hold the TB culture for 6 weeks so far no active TB but the cause for his abnormal CT chest needs to be continually followed up and worked up with the lung doctors this has been ongoing since 2017   [place]

## 2017-08-24 NOTE — Telephone Encounter (Signed)
EMMI flagged patient for answering yes to feeling sad/hopeless/anxious/empty. Clinical Education officer, museum (CSW) was able to reach patient via telephone. Patient reported that he is depressed sometimes. Patient reported that he is not having thoughts of hurting himself. Patient reported that he goes to counseling at [facility] in [place]. Patient reported that he likes [facility] and will continue to go there. Patient reported his last appointment at [facility] was in February. Patient reported no other needs or concerns at this time. No future call is needed.   [employer], LCSW [phone]

## 2017-08-26 DIAGNOSIS — Z0279 Encounter for issue of other medical certificate: Secondary | ICD-10-CM

## 2017-08-26 NOTE — Telephone Encounter (Signed)
Done

## 2017-08-26 NOTE — Telephone Encounter (Signed)
Do you have these forms?

## 2017-08-26 NOTE — Telephone Encounter (Signed)
Patient has been informed of message below.  He stated he has appointment with lung doctor Monday [id number].

## 2017-08-27 ENCOUNTER — Telehealth: Payer: Self-pay

## 2017-08-27 ENCOUNTER — Other Ambulatory Visit: Payer: Self-pay | Admitting: Internal Medicine

## 2017-08-27 DIAGNOSIS — G8929 Other chronic pain: Secondary | ICD-10-CM

## 2017-08-27 DIAGNOSIS — M545 Low back pain: Principal | ICD-10-CM

## 2017-08-27 MED ORDER — METHOCARBAMOL 500 MG PO TABS
500.0000 mg | ORAL_TABLET | Freq: Two times a day (BID) | ORAL | 0 refills | Status: DC | PRN
Start: 2017-08-27 — End: 2017-11-05

## 2017-08-27 NOTE — Telephone Encounter (Signed)
Please advise 

## 2017-08-27 NOTE — Telephone Encounter (Signed)
Copied from [place]. Topic: General - Other >> Aug 27, 2017  8:32 AM Yvette Rack wrote: Reason for CRM: patient calling stating that he has an ortho appt next Wednesday about his back he states that he had to take off from work today because its bothering him he would like a RX called into his [place], [place] - [place]

## 2017-08-27 NOTE — Telephone Encounter (Signed)
Pt called to check on status of RX reqeust. He said that he had moderate success from methocarbamol. Minimal success with tizanidine before. Please advise.

## 2017-08-27 NOTE — Telephone Encounter (Signed)
Robaxin interacts with seroquel so will do low dose only needs to f/u ortho  tMS

## 2017-08-28 ENCOUNTER — Encounter: Payer: 59 | Admitting: Physician Assistant

## 2017-08-28 DIAGNOSIS — L97822 Non-pressure chronic ulcer of other part of left lower leg with fat layer exposed: Secondary | ICD-10-CM | POA: Diagnosis not present

## 2017-08-28 NOTE — Telephone Encounter (Signed)
Patient notified and voiced understanding.

## 2017-08-28 NOTE — Telephone Encounter (Signed)
Form faxed

## 2017-08-30 NOTE — Progress Notes (Signed)
MADIX, BLOWE ([id number]) Visit Report for 08/28/2017 Arrival Information Details Patient Name: [name], [name] Date of Service: 08/28/2017 3:45 PM Medical Record Number: [id number] Patient Account Number: [id number] Date of Birth/Sex: 22-Dec-1960 (57 y.o. M) Treating RN: Roger Shelter Primary Care Janiah Devinney: [name] Other Clinician: Referring Jeyson Deshotel: [name] Treating Ladislao Cohenour/Extender: [name], [name] Weeks in Treatment: 10 Visit Information History Since Last Visit All ordered tests and consults were completed: No Patient Arrived: Ambulatory Added or deleted any medications: No Arrival Time: 15:52 Any new allergies or adverse reactions: No Accompanied By: self Had a fall or experienced change in No Transfer Assistance: None activities of daily living that may affect Patient Identification Verified: Yes risk of falls: Secondary Verification Process Completed: Yes Signs or symptoms of abuse/neglect since last visito No Patient Requires Transmission-Based No Hospitalized since last visit: No Precautions: Implantable device outside of the clinic excluding No Patient Has Alerts: No cellular tissue based products placed in the center since last visit: Pain Present Now: No Electronic Signature(s) Signed: 08/28/2017 4:26:13 PM By: Roger Shelter Entered By: Roger Shelter on 08/28/2017 15:52:50 Dubreuil, Nichlos C. ([id number]) -------------------------------------------------------------------------------- Clinic Level of Care Assessment Details Patient Name: [name], [name] Date of Service: 08/28/2017 3:45 PM Medical Record Number: [id number] Patient Account Number: [id number] Date of Birth/Sex: 02-23-1961 (57 y.o. M) Treating RN: Montey Hora Primary Care Kyia Rhude: [name] Other Clinician: Referring Amauri Keefe: [name] Treating Majesty [name]/Extender: [name], [name] Weeks in Treatment: 10 Clinic Level of Care Assessment Items TOOL  4 Quantity Score []  - Use when only an EandM is performed on FOLLOW-UP visit 0 ASSESSMENTS - Nursing Assessment / Reassessment X - Reassessment of Co-morbidities (includes updates in patient status) 1 10 X- 1 5 Reassessment of Adherence to Treatment Plan ASSESSMENTS - Wound and Skin Assessment / Reassessment []  - Simple Wound Assessment / Reassessment - one wound 0 X- 2 5 Complex Wound Assessment / Reassessment - multiple wounds []  - 0 Dermatologic / Skin Assessment (not related to wound area) ASSESSMENTS - Focused Assessment []  - Circumferential Edema Measurements - multi extremities 0 []  - 0 Nutritional Assessment / Counseling / Intervention X- 1 5 Lower Extremity Assessment (monofilament, tuning fork, pulses) []  - 0 Peripheral Arterial Disease Assessment (using hand held doppler) ASSESSMENTS - Ostomy and/or Continence Assessment and Care []  - Incontinence Assessment and Management 0 []  - 0 Ostomy Care Assessment and Management (repouching, etc.) PROCESS - Coordination of Care X - Simple Patient / Family Education for ongoing care 1 15 []  - 0 Complex (extensive) Patient / Family Education for ongoing care []  - 0 Staff obtains Programmer, systems, Records, Test Results / Process Orders []  - 0 Staff telephones HHA, Nursing Homes / Clarify orders / etc []  - 0 Routine Transfer to another Facility (non-emergent condition) []  - 0 Routine Hospital Admission (non-emergent condition) []  - 0 New Admissions / Biomedical engineer / Ordering NPWT, Apligraf, etc. []  - 0 Emergency Hospital Admission (emergent condition) X- 1 10 Simple Discharge Coordination Schremp, Brydan C. ([id number]) []  - 0 Complex (extensive) Discharge Coordination PROCESS - Special Needs []  - Pediatric / Minor Patient Management 0 []  - 0 Isolation Patient Management []  - 0 Hearing / Language / Visual special needs []  - 0 Assessment of Community assistance (transportation, D/C planning, etc.) []  - 0 Additional  assistance / Altered mentation []  - 0 Support Surface(s) Assessment (bed, cushion, seat, etc.) INTERVENTIONS - Wound Cleansing / Measurement []  - Simple Wound Cleansing - one wound 0 X- 2 5 Complex Wound Cleansing -  multiple wounds X- 1 5 Wound Imaging (photographs - any number of wounds) []  - 0 Wound Tracing (instead of photographs) []  - 0 Simple Wound Measurement - one wound X- 2 5 Complex Wound Measurement - multiple wounds INTERVENTIONS - Wound Dressings X - Small Wound Dressing one or multiple wounds 2 10 []  - 0 Medium Wound Dressing one or multiple wounds []  - 0 Large Wound Dressing one or multiple wounds []  - 0 Application of Medications - topical []  - 0 Application of Medications - injection INTERVENTIONS - Miscellaneous []  - External ear exam 0 []  - 0 Specimen Collection (cultures, biopsies, blood, body fluids, etc.) []  - 0 Specimen(s) / Culture(s) sent or taken to Lab for analysis []  - 0 Patient Transfer (multiple staff / Civil Service fast streamer / Similar devices) []  - 0 Simple Staple / Suture removal (25 or less) []  - 0 Complex Staple / Suture removal (26 or more) []  - 0 Hypo / Hyperglycemic Management (close monitor of Blood Glucose) []  - 0 Ankle / Brachial Index (ABI) - do not check if billed separately X- 1 5 Vital Signs Lora, Coreon C. ([id number]) Has the patient been seen at the hospital within the last three years: Yes Total Score: 105 Level Of Care: New/Established - Level 3 Electronic Signature(s) Signed: 08/28/2017 4:53:17 PM By: Montey Hora Entered By: Montey Hora on 08/28/2017 16:33:01 Deasis, Nik C. ([id number]) -------------------------------------------------------------------------------- Encounter Discharge Information Details Patient Name: [name], [name] Date of Service: 08/28/2017 3:45 PM Medical Record Number: [id number] Patient Account Number: [id number] Date of Birth/Sex: Nov 12, 1960 (57 y.o. M) Treating RN: Montey Hora Primary Care Safaa Stingley:  [name] Other Clinician: Referring Tuyen Uncapher: [name] Treating Otoniel Myhand/Extender: [name], [name] Weeks in Treatment: 10 Encounter Discharge Information Items Discharge Pain Level: 0 Discharge Condition: Stable Ambulatory Status: Ambulatory Discharge Destination: Home Private Transportation: Auto Accompanied By: self Schedule Follow-up Appointment: Yes Medication Reconciliation completed and provided No to Patient/Care Katerine Morua: Clinical Summary of Care: Electronic Signature(s) Signed: 08/28/2017 4:53:17 PM By: Montey Hora Entered By: Montey Hora on 08/28/2017 16:33:44 Colburn, Micheil C. ([id number]) -------------------------------------------------------------------------------- Lower Extremity Assessment Details Patient Name: Artiaga, Alphonza C. Date of Service: 08/28/2017 3:45 PM Medical Record Number: [id number] Patient Account Number: [id number] Date of Birth/Sex: 23-Aug-1960 (57 y.o. M) Treating RN: Roger Shelter Primary Care Danton Palmateer: [name] Other Clinician: Referring Kobe Jansma: [name] Treating Nevah Dalal/Extender: [name], [name] Weeks in Treatment: 10 Edema Assessment Assessed: [Left: No] [Right: No] Edema: [Left: N] [Right: o] Calf Left: Right: Point of Measurement: 37 cm From Medial Instep 38 cm cm Ankle Left: Right: Point of Measurement: 12 cm From Medial Instep 26.5 cm cm Vascular Assessment Claudication: Claudication Assessment [Left:None] Pulses: Dorsalis Pedis Palpable: [Left:Yes] Posterior Tibial Extremity colors, hair growth, and conditions: Extremity Color: [Left:Normal] Hair Growth on Extremity: [Left:No] Temperature of Extremity: [Left:Warm] Capillary Refill: [Left:< 3 seconds] Toe Nail Assessment Left: Right: Thick: Yes Discolored: Yes Deformed: No Improper Length and Hygiene: No Electronic Signature(s) Signed: 08/28/2017 4:26:13 PM By: Roger Shelter Entered By: Roger Shelter on  08/28/2017 16:01:39 Schillaci, Jestin C. ([id number]) -------------------------------------------------------------------------------- Multi Wound Chart Details Patient Name: Rhett, Emersyn C. Date of Service: 08/28/2017 3:45 PM Medical Record Number: [id number] Patient Account Number: [id number] Date of Birth/Sex: May 05, 1961 (57 y.o. M) Treating RN: Montey Hora Primary Care Juel Ripley: [name] Other Clinician: Referring Olen Eaves: [name] Treating Arnetta Odeh/Extender: [name], [name] Weeks in Treatment: 10 Vital Signs Height(in): 75 Pulse(bpm): 86 Weight(lbs): 179 Blood Pressure(mmHg): 132/76 Body Mass Index(BMI): 22 Temperature(F): 98.2 Respiratory Rate 18 (breaths/min): Photos: [N/A:N/A]  Wound Location: Left Lower Leg - Medial, Left Lower Leg - Medial, Distal N/A Proximal Wounding Event: Gradually Appeared Gradually Appeared N/A Primary Etiology: Lymphedema Lymphedema N/A Comorbid History: Sleep Apnea, Confinement Sleep Apnea, Confinement N/A Anxiety Anxiety Date Acquired: 01/17/2017 01/17/2017 N/A Weeks of Treatment: 10 10 N/A Wound Status: Open Open N/A Measurements L x W x D 0.1x0.1x0.1 1.1x1x0.1 N/A (cm) Area (cm) : 0.008 0.864 N/A Volume (cm) : 0.001 0.086 N/A % Reduction in Area: 98.80% 83.10% N/A % Reduction in Volume: 98.60% 83.20% N/A Classification: Full Thickness Without Full Thickness Without N/A Exposed Support Structures Exposed Support Structures Exudate Amount: Medium Large N/A Exudate Type: Serosanguineous Serosanguineous N/A Exudate Color: red, brown red, brown N/A Wound Margin: Flat and Intact Distinct, outline attached N/A Granulation Amount: None Present (0%) Medium (34-66%) N/A Granulation Quality: N/A Red, Hyper-granulation N/A Necrotic Amount: Large (67-100%) Medium (34-66%) N/A Necrotic Tissue: Eschar, Adherent [place] N/A Exposed Structures: Fascia: No Fat Layer (Subcutaneous N/A Fat Layer (Subcutaneous  Tissue) Exposed: Yes Tissue) Exposed: No Fascia: No Tendon: No Tendon: No Burgo, Dacoda C. ([id number]) Muscle: No Muscle: No Joint: No Joint: No Bone: No Bone: No Epithelialization: None Small (1-33%) N/A Periwound Skin Texture: Excoriation: No Excoriation: No N/A Induration: No Induration: No Callus: No Callus: No Crepitus: No Crepitus: No Rash: No Rash: No Scarring: No Scarring: No Periwound Skin Moisture: Maceration: No Maceration: No N/A Dry/Scaly: No Dry/Scaly: No Periwound Skin Color: Atrophie Blanche: No Atrophie Blanche: No N/A Cyanosis: No Cyanosis: No Ecchymosis: No Ecchymosis: No Erythema: No Erythema: No Hemosiderin Staining: No Hemosiderin Staining: No Mottled: No Mottled: No Pallor: No Pallor: No Rubor: No Rubor: No Temperature: No Abnormality No Abnormality N/A Tenderness on Palpation: No Yes N/A Wound Preparation: Ulcer Cleansing: Ulcer Cleansing: N/A Rinsed/Irrigated with Saline Rinsed/Irrigated with Saline Topical Anesthetic Applied: Topical Anesthetic Applied: Other: lidocaine 4% Other: lidocaine 4% Treatment Notes Electronic Signature(s) Signed: 08/28/2017 4:53:17 PM By: Montey Hora Entered By: Montey Hora on 08/28/2017 16:29:16 Amsden, Imari CMarland Kitchen ([id number]) -------------------------------------------------------------------------------- [place] Details Patient Name: Vanbenschoten, Kong C. Date of Service: 08/28/2017 3:45 PM Medical Record Number: [id number] Patient Account Number: [id number] Date of Birth/Sex: 1960-12-22 (57 y.o. M) Treating RN: Montey Hora Primary Care Cheyenne Schumm: [name] Other Clinician: Referring Novalynn Branaman: [name] Treating Benett Swoyer/Extender: [name], [name] Weeks in Treatment: 10 Active Inactive ` Orientation to the Wound Care Program Nursing Diagnoses: Knowledge deficit related to the wound healing center program Goals: Patient/caregiver will verbalize  understanding of the [place] Program Date Initiated: 06/19/2017 Target Resolution Date: 07/10/2017 Goal Status: Active Interventions: Provide education on orientation to the wound center Notes: ` Wound/Skin Impairment Nursing Diagnoses: Impaired tissue integrity Goals: Patient/caregiver will verbalize understanding of skin care regimen Date Initiated: 06/19/2017 Target Resolution Date: 07/10/2017 Goal Status: Active Ulcer/skin breakdown will have a volume reduction of 30% by week 4 Date Initiated: 06/19/2017 Target Resolution Date: 07/10/2017 Goal Status: Active Interventions: Assess patient/caregiver ability to obtain necessary supplies Assess patient/caregiver ability to perform ulcer/skin care regimen upon admission and as needed Assess ulceration(s) every visit Treatment Activities: Skin care regimen initiated : 06/19/2017 Notes: Electronic Signature(s) Signed: 08/28/2017 4:53:17 PM By: Georgena Spurling, Mechele Claude ([id number]) Entered By: Montey Hora on 08/28/2017 16:29:07 Lysne, Rendon C. ([id number]) -------------------------------------------------------------------------------- Pain Assessment Details Patient Name: Maron, Keeshawn C. Date of Service: 08/28/2017 3:45 PM Medical Record Number: [id number] Patient Account Number: [id number] Date of Birth/Sex: Jan 05, 1961 (57 y.o. M) Treating RN: Roger Shelter Primary Care Traci Gafford: [name] Other Clinician: Referring Yatzil Clippinger:  MCLEAN-SCOCOZZA, TRACY Treating Jadier Rockers/Extender: STONE III, [name] Weeks in Treatment: 10 Active Problems Location of Pain Severity and Description of Pain Patient Has Paino No Site Locations Pain Management and Medication Current Pain Management: Electronic Signature(s) Signed: 08/28/2017 4:26:13 PM By: Roger Shelter Entered By: Roger Shelter on 08/28/2017 15:52:56 Schader, Harlon CMarland Kitchen  ([id number]) -------------------------------------------------------------------------------- Patient/Caregiver Education Details Patient Name: Dobos, Esa C. Date of Service: 08/28/2017 3:45 PM Medical Record Number: [id number] Patient Account Number: [id number] Date of Birth/Gender: [birth year] (57 y.o. M) Treating RN: Montey Hora Primary Care Physician: [name] Other Clinician: Referring Physician: [name] Treating Physician/Extender: Sharalyn Ink in Treatment: 10 Education Assessment Education Provided To: Patient Education Topics Provided Wound/Skin Impairment: Handouts: Other: wound care as ordered Methods: Demonstration, Explain/Verbal Responses: State content correctly Electronic Signature(s) Signed: 08/28/2017 4:53:17 PM By: Montey Hora Entered By: Montey Hora on 08/28/2017 16:34:03 Poppen, Castle C. ([id number]) -------------------------------------------------------------------------------- Wound Assessment Details Patient Name: Lacher, [name] C. Date of Service: 08/28/2017 3:45 PM Medical Record Number: [id number] Patient Account Number: [id number] Date of Birth/Sex: April 12, 1961 (57 y.o. M) Treating RN: Roger Shelter Primary Care Tymika Grilli: [name] Other Clinician: Referring Rogina Schiano: [name] Treating Meeah Totino/Extender: [name], [name] Weeks in Treatment: 10 Wound Status Wound Number: 1 Primary Etiology: Lymphedema Wound Location: Left Lower Leg - Medial, Proximal Wound Status: Open Wounding Event: Gradually Appeared Comorbid History: Sleep Apnea, Confinement Anxiety Date Acquired: 01/17/2017 Weeks Of Treatment: 10 Clustered Wound: No Photos Photo Uploaded By: Roger Shelter on 08/28/2017 16:07:24 Wound Measurements Length: (cm) 0.1 Width: (cm) 0.1 Depth: (cm) 0.1 Area: (cm) 0.008 Volume: (cm) 0.001 % Reduction in Area: 98.8% % Reduction in Volume: 98.6% Epithelialization:  None Tunneling: No Undermining: No Wound Description Full Thickness Without Exposed Support Classification: Structures Wound Margin: Flat and Intact Exudate Medium Amount: Exudate Type: Serosanguineous Exudate Color: red, brown Foul Odor After Cleansing: No Slough/Fibrino Yes Wound Bed Granulation Amount: None Present (0%) Exposed Structure Necrotic Amount: Large (67-100%) Fascia Exposed: No Necrotic Quality: Eschar, Adherent Slough Fat Layer (Subcutaneous Tissue) Exposed: No Tendon Exposed: No Muscle Exposed: No Joint Exposed: No Bone Exposed: No Lickteig, Zacharius C. ([id number]) Periwound Skin Texture Texture Color No Abnormalities Noted: No No Abnormalities Noted: No Callus: No Atrophie Blanche: No Crepitus: No Cyanosis: No Excoriation: No Ecchymosis: No Induration: No Erythema: No Rash: No Hemosiderin Staining: No Scarring: No Mottled: No Pallor: No Moisture Rubor: No No Abnormalities Noted: No Dry / Scaly: No Temperature / Pain Maceration: No Temperature: No Abnormality Wound Preparation Ulcer Cleansing: Rinsed/Irrigated with Saline Topical Anesthetic Applied: Other: lidocaine 4%, Treatment Notes Wound #1 (Left, Proximal, Medial Lower Leg) 1. Cleansed with: Clean wound with Normal Saline 2. Anesthetic Topical Lidocaine 4% cream to wound bed prior to debridement 4. Dressing Applied: Prisma Ag 5. Secondary Dressing Applied Bordered Foam Dressing 7. Secured with Patient to wear own compression stockings Electronic Signature(s) Signed: 08/28/2017 4:26:13 PM By: Roger Shelter Entered By: Roger Shelter on 08/28/2017 15:59:58 Lobato, Laiden C. ([id number]) -------------------------------------------------------------------------------- Wound Assessment Details Patient Name: Deramo, Neri C. Date of Service: 08/28/2017 3:45 PM Medical Record Number: [id number] Patient Account Number: [id number] Date of Birth/Sex: 06-10-1960 (57 y.o. M) Treating RN: Roger Shelter Primary Care Dorris Pierre: [name] Other Clinician: Referring Brysen Shankman: [name] Treating Glenn Gullickson/Extender: STONE III, [name] Weeks in Treatment: 10 Wound Status Wound Number: 2 Primary Etiology: Lymphedema Wound Location: Left Lower Leg - Medial, Distal Wound Status: Open Wounding Event: Gradually Appeared Comorbid History: Sleep Apnea, Confinement Anxiety Date Acquired: 01/17/2017 Weeks Of Treatment: 10 Clustered Wound: No  Photos Photo Uploaded By: Roger Shelter on 08/28/2017 16:07:39 Wound Measurements Length: (cm) 1.1 Width: (cm) 1 Depth: (cm) 0.1 Area: (cm) 0.864 Volume: (cm) 0.086 % Reduction in Area: 83.1% % Reduction in Volume: 83.2% Epithelialization: Small (1-33%) Tunneling: No Undermining: No Wound Description Full Thickness Without Exposed Support Classification: Structures Wound Margin: Distinct, outline attached Exudate Large Amount: Exudate Type: Serosanguineous Exudate Color: red, brown Foul Odor After Cleansing: No Slough/Fibrino Yes Wound Bed Granulation Amount: Medium (34-66%) Exposed Structure Granulation Quality: Red, Hyper-granulation Fascia Exposed: No Necrotic Amount: Medium (34-66%) Fat Layer (Subcutaneous Tissue) Exposed: Yes Necrotic Quality: Adherent Slough Tendon Exposed: No Muscle Exposed: No Joint Exposed: No Bone Exposed: No Freel, Marrion C. ([id number]) Periwound Skin Texture Texture Color No Abnormalities Noted: No No Abnormalities Noted: No Callus: No Atrophie Blanche: No Crepitus: No Cyanosis: No Excoriation: No Ecchymosis: No Induration: No Erythema: No Rash: No Hemosiderin Staining: No Scarring: No Mottled: No Pallor: No Moisture Rubor: No No Abnormalities Noted: No Dry / Scaly: No Temperature / Pain Maceration: No Temperature: No Abnormality Tenderness on Palpation: Yes Wound Preparation Ulcer Cleansing: Rinsed/Irrigated with Saline Topical Anesthetic Applied: Other:  lidocaine 4%, Treatment Notes Wound #2 (Left, Distal, Medial Lower Leg) 1. Cleansed with: Clean wound with Normal Saline 2. Anesthetic Topical Lidocaine 4% cream to wound bed prior to debridement 4. Dressing Applied: Prisma Ag 5. Secondary Dressing Applied Bordered Foam Dressing 7. Secured with Patient to wear own compression stockings Electronic Signature(s) Signed: 08/28/2017 4:26:13 PM By: Roger Shelter Entered By: Roger Shelter on 08/28/2017 16:00:29 Mcadory, Rafel C. ([id number]) -------------------------------------------------------------------------------- Vitals Details Patient Name: Steinbach, Manases C. Date of Service: 08/28/2017 3:45 PM Medical Record Number: [id number] Patient Account Number: [id number] Date of Birth/Sex: [birth year] (56 y.o. M) Treating RN: Roger Shelter Primary Care Mattie Nordell: [name] Other Clinician: Referring Lakendra Helling: [name] Treating Cecelia Graciano/Extender: [name], [name] Weeks in Treatment: 10 Vital Signs Time Taken: 15:52 Temperature (F): 98.2 Height (in): 75 Pulse (bpm): 86 Weight (lbs): 179 Respiratory Rate (breaths/min): 18 Body Mass Index (BMI): 22.4 Blood Pressure (mmHg): 132/76 Reference Range: 80 - 120 mg / dl Electronic Signature(s) Signed: 08/28/2017 4:26:13 PM By: Roger Shelter Entered By: Roger Shelter on 08/28/2017 15:53:14

## 2017-08-31 ENCOUNTER — Encounter: Payer: Self-pay | Admitting: Internal Medicine

## 2017-08-31 ENCOUNTER — Ambulatory Visit: Payer: 59 | Admitting: Internal Medicine

## 2017-08-31 VITALS — BP 124/70 | HR 83 | Resp 16 | Ht 75.0 in | Wt 246.0 lb

## 2017-08-31 DIAGNOSIS — J181 Lobar pneumonia, unspecified organism: Secondary | ICD-10-CM | POA: Diagnosis not present

## 2017-08-31 NOTE — Patient Instructions (Signed)
Repeat Ct chest in about 1 month. Follow up after that.

## 2017-08-31 NOTE — Progress Notes (Signed)
* [name]     Assessment and Plan:  Pneumonia. -Symptoms of dyspnea, fever, excess sputum production consistent with a diagnosis of pneumonia. - Continue antibioticsper ID recommendations, agree with current rule out TB. -Given recurrence of severe pneumonia in 2 years, would consider checking serum immunoglobulins, once the patient has recovered (acute illness can affect results). -Given resolution of pneumonic infiltrates previously with treatment of steroids and antibiotics, will check ACE andANCAlevel. --Outpatient followup in 2-4 weeks, at that time will schedule follow up imaging.   Masslike consolidation. -Patient presented similarly in 2017, underwent bronchoscopy which was negative. After treatment with antibiotics and steroids the infiltrates cleared. -We will therefore treat again with IV antibiotics, can change to p.o. antibiotics upon discharge, presuming the TB has been ruled out. -We will need repeat imaging to ensure resolution, if not the patient may require a bronchoscopy.  OSA.  --Split night study was performed on 08/04/12; AHI of 14.9; titrated to CPAP of 8.  --sat dropped to 89% on RA, patient was started on 2L [place].  --He has not been using CPAP regularly anymore at home, was intolerant, then symptoms improved after bariatric surgery and CPAP was deemed no longer necessary.   Orders Placed This Encounter  Procedures  . CT CHEST WO CONTRAST   Return in about 1 month (around 09/28/2017).     Date: 08/31/2017  MRN# [id number] [name] 09/20/1960   [name] is a 57 y.o. old male seen in follow up for chief complaint of  Chief Complaint  Patient presents with  . Hospitalization Follow-up    Former [facility] patient who is here for f/u pneumonia  . Pneumonia    Pt does have sob with exertion and chest tighness. He denies cough/wheeze.     HPI:   Patient is a 57 year old male, recently seen in March 2019 inpatient with left  upper lobe consolidation and masslike infiltrate in the lingula in the left lower lobe medially.  It is similar presentation a few years prior, underwent bronchoscopy which was negative and his infiltrates resolved.  On this recent admission patient was seen by ID service, he underwent sputum AFB smear x3 which were negative, cultures remain pending. 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 He still have chest tightness with activity.  He has a history of OSA. He was originally diagnosed remotely but lost a tremendous amount of weight after gastric bypass in 2003. He was seen by [facility] in 2014 for OSA, and was encouraged to continue to use cpap, but is now longer using it since he had a gastric bypass.   **sleep study on 08/04/12 AHI of 14.9, CPAP titrated to 8, but did not continue to use it. **chest x-ray 08/11/17; large consolidation in the left upper lobe and the hilar location, masslike infiltrate in the lingula and in the left lower lobe medially.  There is right and left paratracheal, left hilar and subcarinal lymphadenopathy.   Medication:    Current Outpatient Medications:  .  acetaminophen (TYLENOL) 500 MG tablet, Take 1,000 mg by mouth every 8 (eight) hours as needed for mild pain or moderate pain., Disp: , Rfl:  .  albuterol (PROVENTIL HFA;VENTOLIN HFA) 108 (90 Base) MCG/ACT inhaler, Inhale 1-2 puffs into the lungs every 6 (six) hours as needed for wheezing or shortness of breath., Disp: 1 Inhaler, Rfl: 11 .  ALPRAZolam Duanne Moron)  1 MG tablet, Take 1 mg by mouth 3 (three) times daily as needed for anxiety. Taking 0.5 to 3 mg qd per psychiatry, Disp: , Rfl:  .  diclofenac (VOLTAREN) 75 MG EC tablet, Take 75 mg by mouth 2 (two) times daily., Disp: , Rfl:  .  DULOXETINE HCL PO, Take 120 mg by mouth every morning. , Disp: , Rfl:  .  methocarbamol (ROBAXIN) 500 MG tablet, Take 1 tablet (500 mg total) by mouth 2 (two)  times daily as needed for muscle spasms., Disp: 45 tablet, Rfl: 0 .  QUEtiapine (SEROQUEL) 300 MG tablet, Take 600 mg by mouth at bedtime. , Disp: , Rfl:  .  traMADol (ULTRAM) 50 MG tablet, Take 1 tablet (50 mg total) by mouth every 8 (eight) hours as needed., Disp: 20 tablet, Rfl: 0   Allergies:  Sulfa antibiotics  Review of Systems: Gen:  Denies  fever, sweats. HEENT: Denies blurred vision. Cvc:  No dizziness, chest pain or heaviness Resp:   Denies cough or sputum porduction. Gi: Denies swallowing difficulty, stomach pain. constipation, bowel incontinence Gu:  Denies bladder incontinence, burning urine Ext:   No Joint pain, stiffness. Skin: No skin rash, easy bruising. Endoc:  No polyuria, polydipsia. Psych: No depression, insomnia. Other:  All other systems were reviewed and found to be negative other than what is mentioned in the HPI.   Physical Examination:   VS: BP 124/70 (BP Location: Left Arm, Cuff Size: Normal)   Pulse 83   Resp 16   Ht 6\' 3"  (1.905 m)   Wt 246 lb (111.6 kg)   SpO2 97%   BMI 30.75 kg/m    General Appearance: No distress  Neuro:without focal findings,  speech normal,  HEENT: PERRLA, EOM intact. Pulmonary: normal breath sounds, No wheezing.   CardiovascularNormal S1,S2.  No m/r/g.   Abdomen: Benign, Soft, non-tender. Renal:  No costovertebral tenderness  GU:  Not performed at this time. Endoc: No evident thyromegaly, no signs of acromegaly. Skin:   warm, no rash. Extremities: normal, no cyanosis, clubbing.   LABORATORY PANEL:   CBC No results for input(s): WBC, HGB, HCT, PLT in the last 168 hours. ------------------------------------------------------------------------------------------------------------------  Chemistries  No results for input(s): NA, K, CL, CO2, GLUCOSE, BUN, CREATININE, CALCIUM, MG, AST, ALT, ALKPHOS, BILITOT in the last 168 hours.  Invalid input(s):  GFRCGP ------------------------------------------------------------------------------------------------------------------  Cardiac Enzymes No results for input(s): TROPONINI in the last 168 hours. ------------------------------------------------------------  RADIOLOGY:   No results found for this or any previous visit. Results for orders placed in visit on 07/07/17  DG Chest 2 View   Narrative CLINICAL DATA:  Shortness of breath.  Reported recent pneumonia  EXAM: CHEST  2 VIEW  COMPARISON:  January 24, 2017  FINDINGS: There is scarring in the right mid lung region, stable. There is no appreciable edema or consolidation. Heart size and pulmonary vascularity are normal. No adenopathy. No bone lesions. There are areas of mild eventration along each hemidiaphragm, stable.  IMPRESSION: Scarring right mid lung region anteriorly. No edema or consolidation. Stable cardiac silhouette.   Electronically Signed   By: Lowella Grip III M.D.   On: 07/08/2017 08:54    ------------------------------------------------------------------------------------------------------------------  Thank  you for allowing [facility] to assist in the care of your patient. Our recommendations are noted above.  Please contact us if we can be of further service.   Marda Stalker, MD.  [facility] Office Number: [phone]  Patricia Pesa, M.D.  Merton Border, M.D  08/31/2017

## 2017-09-01 NOTE — Progress Notes (Signed)
LYNTON, CRESCENZO ([id number]) Visit Report for 08/28/2017 Chief Complaint Document Details Patient Name: [name], [name] Date of Service: 08/28/2017 3:45 PM Medical Record Number: [id number] Patient Account Number: [id number] Date of Birth/Sex: [birth year] (57 y.o. M) Treating RN: [name] Primary Care Provider: Myrtie [name] Other Clinician: Referring Provider: Myrtie [name] Treating Provider/Extender: [name], [name] in Treatment: 10 Information Obtained from: Patient Chief Complaint He is here in follow up for lle venous ulcers Electronic Signature(s) Signed: 08/28/2017 6:18:22 PM By: [name] Keeler PA-C Entered By: [name] on 08/28/2017 15:34:51 [name], [name] ([id number]) -------------------------------------------------------------------------------- HPI Details Patient Name: [name], [name] Date of Service: 08/28/2017 3:45 PM Medical Record Number: [id number] Patient Account Number: [id number] Date of Birth/Sex: [birth year] (57 y.o. M) Treating RN: [name] Primary Care Provider: Myrtie [name] Other Clinician: Referring Provider: Myrtie [name] Treating Provider/Extender: [name], [name] [name] in Treatment: 10 History of Present Illness HPI Description: 06/25/17-he is here in follow-up evaluation for left lower extremity, medial malleolus, venous ulcers. He is compliant with compression stocking where although his compression stocking does not fit appropriately in length. He has been advised to contact elastic therapy, who measured and provided his current stockings, regarding a more appropriate fitted stocking. He is voicing no complaints or concerns, tolerated debridement. There is improvement in appearance, no significant change in measurements. We will continue with Santyl ointment, will apply to all 3 wounds. He will follow-up next week 06/30/17-he is here earlier than his scheduled appointment on Friday, per his request, secondary  to new open areas. After inquiry, it appears that the new superficial open areas are secondary to adhesive removal. There is no evidence of infection, minimal/scant drainage, most of the new areas are epithelialized. His chronic wounds are stable in appearance, will continue with Santyl. He wants to maintain Friday appointment and will follow-up next Friday, he was encouraged to contact clinic with any ulcer changes or concerns 07/24/17 On evaluation today patient appears to be doing well in regard to left lower extremity ulcer. He has been tolerating the dressing changes without complication. He still continues to use the [place] which does seem to be helpful for him this is excellent news. Definitely no evidence of infection today which is good news. Overall I'm pleased with how things are going. Patient likewise is also pleased. 07/31/17 on evaluation today patient's wound on the left medial lower extremity appears to be doing okay although he is having a little bit of maceration noted at this point. Obviously this may be due to the drainage coupled with the fact we have been using the Santyl and trying to keep this voice. Obviously it looks as if the wound is a little bit too macerated at this time. Fortunately there does not appear to be any evidence of infection which is good news. No fevers, chills, nausea, or vomiting noted at this time. 08/21/17 on evaluation today patient appears to be doing very well in regard to his left lower extremity ulcers. In fact one of the two these have completely healed at this point. The other is doing much better it is smaller and there is good epithelialization no significant adherent slough noted at this point. He has been tolerating the dressing changes without complication. There does not appear to be any evidence of infection. Unfortunately since I last saw him he has been in the hospital due to pneumonia. The good news is he seems to be improving following  his hospital stay he still seems a  little bit fatigued in my opinion he states that is indeed the case. Overall the wound does appear to be better. 08/28/17 on evaluation today patient appears to be doing very well in regard to his lower extremity ulcers. He has been tolerating the dressing changes without complication. Overall he states that he's not having as much discomfort as he has in the past which is also good news. He is very pleased with how things have progressed. Electronic Signature(s) Signed: 08/28/2017 6:18:22 PM By: [name] Keeler PA-C Entered By: [name] on 08/28/2017 17:42:30 [name], [name] ([id number]) -------------------------------------------------------------------------------- Physical Exam Details Patient Name: [name], [name] Date of Service: 08/28/2017 3:45 PM Medical Record Number: [id number] Patient Account Number: [id number] Date of Birth/Sex: 11-13-1960 (57 y.o. M) Treating RN: [name] Primary Care Provider: Myrtie [name] Other Clinician: Referring Provider: Myrtie [name] Treating Provider/Extender: [name] [name] in Treatment: 10 Constitutional Well-nourished and well-hydrated in no acute distress. Respiratory normal breathing without difficulty. clear to auscultation bilaterally. Cardiovascular regular rate and rhythm with normal S1, S2. Psychiatric this patient is able to make decisions and demonstrates good insight into disease process. Alert and Oriented x 3. pleasant and cooperative. Notes Patient's wound shows an excellent granular surface on evaluation today this did not require any additional sharp debridement today I did cleanse the wounds with saline and gauze which he tolerated well without complication. Electronic Signature(s) Signed: 08/28/2017 6:18:22 PM By: [name] Keeler PA-C Entered By: [name] on 08/28/2017 17:43:10 [name], [name]  ([id number]) -------------------------------------------------------------------------------- Physician Orders Details Patient Name: [name], [name] Date of Service: 08/28/2017 3:45 PM Medical Record Number: [id number] Patient Account Number: [id number] Date of Birth/Sex: 1960/08/24 (57 y.o. M) Treating RN: [name] Primary Care Provider: Myrtie [name] Other Clinician: Referring Provider: Myrtie [name] Treating Provider/Extender: [name], Avia Merkley [name] in Treatment: 10 Verbal / Phone Orders: No Diagnosis Coding ICD-10 Coding Code Description I89.0 Lymphedema, not elsewhere classified L97.822 Non-pressure chronic ulcer of other part of left lower leg with fat layer exposed E66.09 Other obesity due to excess calories Wound Cleansing Wound #1 Left,Proximal,Medial Lower Leg o Clean wound with Normal Saline. Wound #2 Left,Distal,Medial Lower Leg o Clean wound with Normal Saline. Anesthetic (add to Medication List) Wound #1 Left,Proximal,Medial Lower Leg o Topical Lidocaine 4% cream applied to wound bed prior to debridement (In Clinic Only). Wound #2 Left,Distal,Medial Lower Leg o Topical Lidocaine 4% cream applied to wound bed prior to debridement (In Clinic Only). Primary Wound Dressing Wound #1 Left,Proximal,Medial Lower Leg o Silver Collagen Wound #2 Left,Distal,Medial Lower Leg o Silver Collagen Secondary Dressing Wound #1 Left,Proximal,Medial Lower Leg o Boardered Foam Dressing Wound #2 Left,Distal,Medial Lower Leg o Boardered Foam Dressing Dressing Change Frequency Wound #1 Left,Proximal,Medial Lower Leg o Change dressing every other day. Wound #2 Left,Distal,Medial Lower Leg o Change dressing every other day. Follow-up Appointments [name], [name] ([id number]) Wound #1 Left,Proximal,Medial Lower Leg o Return Appointment in 1 week. Wound #2 Left,Distal,Medial Lower Leg o Return Appointment in 1 week. Edema Control Wound #1  Left,Proximal,Medial Lower Leg o Patient to wear own compression stockings Wound #2 Left,Distal,Medial Lower Leg o Patient to wear own compression stockings Electronic Signature(s) Signed: 08/28/2017 4:53:17 PM By: [name] Signed: 08/28/2017 6:18:22 PM By: [name] Keeler PA-C Entered By: [name] on 08/28/2017 16:29:36 [name], [name] ([id number]) -------------------------------------------------------------------------------- Problem List Details Patient Name: Giannini, Noe C. Date of Service: 08/28/2017 3:45 PM Medical Record Number: [id number] Patient Account Number: [id number] Date of Birth/Sex: 07-20-1960 (57  y.o. M) Treating RN: [name] Primary Care Provider: Myrtie [name] Other Clinician: Referring Provider: Myrtie [name] Treating Provider/Extender: [name], Kole Hilyard [name] in Treatment: 10 Active Problems ICD-10 Impacting Encounter Code Description Active Date Wound Healing Diagnosis I89.0 Lymphedema, not elsewhere classified 06/22/2017 Yes L97.822 Non-pressure chronic ulcer of other part of left lower leg with 06/22/2017 Yes fat layer exposed E66.09 Other obesity due to excess calories 06/22/2017 Yes Inactive Problems Resolved Problems Electronic Signature(s) Signed: 08/28/2017 6:18:22 PM By: [name] Keeler PA-C Entered By: [name] on 08/28/2017 15:34:45 [name], [name] ([id number]) -------------------------------------------------------------------------------- Progress Note Details Patient Name: [name], [name] Date of Service: 08/28/2017 3:45 PM Medical Record Number: [id number] Patient Account Number: [id number] Date of Birth/Sex: 1961/02/13 (57 y.o. M) Treating RN: [name] Primary Care Provider: Myrtie [name] Other Clinician: Referring Provider: Myrtie [name] Treating Provider/Extender: [name], Kasidee Voisin [name] in Treatment: 10 Subjective Chief Complaint Information obtained from Patient He is here in follow up  for lle venous ulcers History of Present Illness (HPI) 06/25/17-he is here in follow-up evaluation for left lower extremity, medial malleolus, venous ulcers. He is compliant with compression stocking where although his compression stocking does not fit appropriately in length. He has been advised to contact elastic therapy, who measured and provided his current stockings, regarding a more appropriate fitted stocking. He is voicing no complaints or concerns, tolerated debridement. There is improvement in appearance, no significant change in measurements. We will continue with Santyl ointment, will apply to all 3 wounds. He will follow-up next week 06/30/17-he is here earlier than his scheduled appointment on Friday, per his request, secondary to new open areas. After inquiry, it appears that the new superficial open areas are secondary to adhesive removal. There is no evidence of infection, minimal/scant drainage, most of the new areas are epithelialized. His chronic wounds are stable in appearance, will continue with Santyl. He wants to maintain Friday appointment and will follow-up next Friday, he was encouraged to contact clinic with any ulcer changes or concerns 07/24/17 On evaluation today patient appears to be doing well in regard to left lower extremity ulcer. He has been tolerating the dressing changes without complication. He still continues to use the [place] which does seem to be helpful for him this is excellent news. Definitely no evidence of infection today which is good news. Overall I'm pleased with how things are going. Patient likewise is also pleased. 07/31/17 on evaluation today patient's wound on the left medial lower extremity appears to be doing okay although he is having a little bit of maceration noted at this point. Obviously this may be due to the drainage coupled with the fact we have been using the Santyl and trying to keep this voice. Obviously it looks as if the wound is a  little bit too macerated at this time. Fortunately there does not appear to be any evidence of infection which is good news. No fevers, chills, nausea, or vomiting noted at this time. 08/21/17 on evaluation today patient appears to be doing very well in regard to his left lower extremity ulcers. In fact one of the two these have completely healed at this point. The other is doing much better it is smaller and there is good epithelialization no significant adherent slough noted at this point. He has been tolerating the dressing changes without complication. There does not appear to be any evidence of infection. Unfortunately since I last saw him he has been in the hospital due to pneumonia. The good news  is he seems to be improving following his hospital stay he still seems a little bit fatigued in my opinion he states that is indeed the case. Overall the wound does appear to be better. 08/28/17 on evaluation today patient appears to be doing very well in regard to his lower extremity ulcers. He has been tolerating the dressing changes without complication. Overall he states that he's not having as much discomfort as he has in the past which is also good news. He is very pleased with how things have progressed. Patient History Information obtained from Patient. Family History Cancer - Father, Heart Disease - Father,Siblings, No family history of Diabetes, Hereditary Spherocytosis, Hypertension, Kidney Disease, Lung Disease, Seizures, Stroke, Thyroid Problems, Tuberculosis. [name], [name] ([id number]) Social History Current some day smoker, Marital Status - Separated, Alcohol Use - Rarely, Drug Use - No History, Caffeine Use - Daily. Review of Systems (ROS) Constitutional Symptoms (General Health) Denies complaints or symptoms of Fever, Chills. Respiratory The patient has no complaints or symptoms. Cardiovascular The patient has no complaints or symptoms. Psychiatric The patient has no  complaints or symptoms. Objective Constitutional Well-nourished and well-hydrated in no acute distress. Vitals Time Taken: 3:52 PM, Height: 75 in, Weight: 179 lbs, BMI: 22.4, Temperature: 98.2 F, Pulse: 86 bpm, Respiratory Rate: 18 breaths/min, Blood Pressure: 132/76 mmHg. Respiratory normal breathing without difficulty. clear to auscultation bilaterally. Cardiovascular regular rate and rhythm with normal S1, S2. Psychiatric this patient is able to make decisions and demonstrates good insight into disease process. Alert and Oriented x 3. pleasant and cooperative. General Notes: Patient's wound shows an excellent granular surface on evaluation today this did not require any additional sharp debridement today I did cleanse the wounds with saline and gauze which he tolerated well without complication. Integumentary (Hair, Skin) Wound #1 status is Open. Original cause of wound was Gradually Appeared. The wound is located on the Left,Proximal,Medial Lower Leg. The wound measures 0.1cm length x 0.1cm width x 0.1cm depth; 0.008cm^2 area and 0.001cm^3 volume. There is no tunneling or undermining noted. There is a medium amount of serosanguineous drainage noted. The wound margin is flat and intact. There is no granulation within the wound bed. There is a large (67-100%) amount of necrotic tissue within the wound bed including Eschar and Adherent Slough. The periwound skin appearance did not exhibit: Callus, Crepitus, Excoriation, Induration, Rash, Scarring, Dry/Scaly, Maceration, Atrophie Blanche, Cyanosis, Ecchymosis, Hemosiderin Staining, Mottled, Pallor, Rubor, Erythema. Periwound temperature was noted as No Abnormality. Wound #2 status is Open. Original cause of wound was Gradually Appeared. The wound is located on the Left,Distal,Medial Lower Leg. The wound measures 1.1cm length x 1cm width x 0.1cm depth; 0.864cm^2 area and 0.086cm^3 volume. There is Fat Layer (Subcutaneous Tissue) Exposed  exposed. There is no tunneling or undermining noted. There is a large amount of serosanguineous drainage noted. The wound margin is distinct with the outline attached to the wound base. There is medium (34-66%) red, hyper - granulation within the wound bed. There is a medium (34-66%) amount of necrotic tissue within the [name], [name] ([id number]) wound bed including Adherent Slough. The periwound skin appearance did not exhibit: Callus, Crepitus, Excoriation, Induration, Rash, Scarring, Dry/Scaly, Maceration, Atrophie Blanche, Cyanosis, Ecchymosis, Hemosiderin Staining, Mottled, Pallor, Rubor, Erythema. Periwound temperature was noted as No Abnormality. The periwound has tenderness on palpation. Assessment Active Problems ICD-10 I89.0 - Lymphedema, not elsewhere classified L97.822 - Non-pressure chronic ulcer of other part of left lower leg with fat layer exposed E66.09 - Other obesity  due to excess calories Plan Wound Cleansing: Wound #1 Left,Proximal,Medial Lower Leg: Clean wound with Normal Saline. Wound #2 Left,Distal,Medial Lower Leg: Clean wound with Normal Saline. Anesthetic (add to Medication List): Wound #1 Left,Proximal,Medial Lower Leg: Topical Lidocaine 4% cream applied to wound bed prior to debridement (In Clinic Only). Wound #2 Left,Distal,Medial Lower Leg: Topical Lidocaine 4% cream applied to wound bed prior to debridement (In Clinic Only). Primary Wound Dressing: Wound #1 Left,Proximal,Medial Lower Leg: Silver Collagen Wound #2 Left,Distal,Medial Lower Leg: Silver Collagen Secondary Dressing: Wound #1 Left,Proximal,Medial Lower Leg: Boardered Foam Dressing Wound #2 Left,Distal,Medial Lower Leg: Boardered Foam Dressing Dressing Change Frequency: Wound #1 Left,Proximal,Medial Lower Leg: Change dressing every other day. Wound #2 Left,Distal,Medial Lower Leg: Change dressing every other day. Follow-up Appointments: Wound #1 Left,Proximal,Medial Lower Leg: Return  Appointment in 1 week. Wound #2 Left,Distal,Medial Lower Leg: Return Appointment in 1 week. Edema Control: Wound #1 Left,Proximal,Medial Lower Leg: Patient to wear own compression stockings Wound #2 Left,Distal,Medial Lower Leg: Patient to wear own compression stockings [name], Mase C. ([id number]) I am going to recommend at this point in time that we continue with the Current wound care measures. Patient is in agreement this plan. If anything changes in the meantime he will let us know. Please see above for specific wound care orders. We will see patient for re-evaluation in 1 week(s) here in the clinic. If anything worsens or changes patient will contact our office for additional recommendations. Electronic Signature(s) Signed: 08/28/2017 6:18:22 PM By: [name] Keeler PA-C Entered By: [name] on 08/28/2017 17:43:27 Mandarino, Zorion [name] [name] ([id number]) -------------------------------------------------------------------------------- ROS/PFSH Details Patient Name: Hally, Drayven C. Date of Service: 08/28/2017 3:45 PM Medical Record Number: [id number] Patient Account Number: [id number] Date of Birth/Sex: [birth year] (57 y.o. M) Treating RN: [name] Primary Care Provider: Myrtie [name] Other Clinician: Referring Provider: Myrtie [name] Treating Provider/Extender: [name], Sarah Baez [name] in Treatment: 10 Information Obtained From Patient Wound History Do you currently have one or more open woundso Yes How many open wounds do you currently haveo 3 Approximately how long have you had your woundso several months How have you been treating your wound(s) until nowo xeroform gauze Has your wound(s) ever healed and then re-openedo No Have you had any lab work done in the past montho No Have you tested positive for an antibiotic resistant organism (MRSA, VRE)o No Have you tested positive for osteomyelitis (bone infection)o No Have you had any tests for circulation on your legso  No Constitutional Symptoms (General Health) Complaints and Symptoms: Negative for: Fever; Chills Eyes Medical History: Negative for: Cataracts; Glaucoma; Optic Neuritis Hematologic/Lymphatic Medical History: Negative for: Anemia; Hemophilia; Human Immunodeficiency Virus; Lymphedema; Sickle Cell Disease Respiratory Complaints and Symptoms: No Complaints or Symptoms Medical History: Positive for: Sleep Apnea Negative for: Aspiration; Asthma; Chronic Obstructive Pulmonary Disease (COPD); Pneumothorax; Tuberculosis Cardiovascular Complaints and Symptoms: No Complaints or Symptoms Medical History: Negative for: Arrhythmia; Congestive Heart Failure; Coronary Artery Disease; Deep Vein Thrombosis; Hypertension; Hypotension; Myocardial Infarction; Peripheral Arterial Disease; Peripheral Venous Disease; Phlebitis; Vasculitis Gastrointestinal Merle, Krzysztof C. ([id number]) Medical History: Negative for: Cirrhosis ; Colitis; Crohnos; Hepatitis A; Hepatitis B; Hepatitis C Endocrine Medical History: Negative for: Type I Diabetes; Type II Diabetes Genitourinary Medical History: Negative for: End Stage Renal Disease Immunological Medical History: Negative for: Lupus Erythematosus; Raynaudos; Scleroderma Integumentary (Skin) Medical History: Negative for: History of Burn Musculoskeletal Medical History: Negative for: Gout; Rheumatoid Arthritis; Osteoarthritis; Osteomyelitis Neurologic Medical History: Negative for: Dementia; Neuropathy; Paraplegia; Seizure Disorder Psychiatric Complaints and Symptoms: No  Complaints or Symptoms Medical History: Positive for: Confinement Anxiety Negative for: Anorexia/bulimia Immunizations Pneumococcal Vaccine: Received Pneumococcal Vaccination: No Implantable Devices Family and Social History Cancer: Yes - Father; Diabetes: No; Heart Disease: Yes - Father,Siblings; Hereditary Spherocytosis: No; Hypertension: No; Kidney Disease: No; Lung Disease: No;  Seizures: No; Stroke: No; Thyroid Problems: No; Tuberculosis: No; Current some day smoker; Marital Status - Separated; Alcohol Use: Rarely; Drug Use: No History; Caffeine Use: Daily; Financial Concerns: No; Food, Clothing or Shelter Needs: No; Support System Lacking: No; Transportation Concerns: No; Advanced Directives: No; Patient does not want information on Advanced Directives; Do not resuscitate: No; Living Will: No; Medical Power of Attorney: No Physician Affirmation I have reviewed and agree with the above information. ARATH, KAIGLER ([id number]) Electronic Signature(s) Signed: 08/28/2017 6:18:22 PM By: [name] Keeler PA-C Signed: 08/31/2017 5:08:18 PM By: [name] Entered By: [name] on 08/28/2017 17:42:48 Biggers, Abdulmalik C. ([id number]) -------------------------------------------------------------------------------- SuperBill Details Patient Name: Micco, Houston C. Date of Service: 08/28/2017 Medical Record Number: [id number] Patient Account Number: [id number] Date of Birth/Sex: 02/03/1961 (57 y.o. M) Treating RN: [name] Primary Care Provider: Myrtie [name] Other Clinician: Referring Provider: Myrtie [name] Treating Provider/Extender: [name], [place] Miyoshi [name] in Treatment: 10 Diagnosis Coding ICD-10 Codes Code Description I89.0 Lymphedema, not elsewhere classified L97.822 Non-pressure chronic ulcer of other part of left lower leg with fat layer exposed E66.09 Other obesity due to excess calories Facility Procedures CPT4 Code: [id number] Description: 99213 - WOUND CARE VISIT-LEV 3 EST PT Modifier: Quantity: 1 Physician Procedures CPT4 Code Description: [id number] 99213 - WC PHYS LEVEL 3 - EST PT ICD-10 Diagnosis Description I89.0 Lymphedema, not elsewhere classified L97.822 Non-pressure chronic ulcer of other part of left lower leg wit E66.09 Other obesity due to excess calories Modifier: h fat layer expos Quantity: 1 ed Electronic Signature(s) Signed:  08/28/2017 6:18:22 PM By: [name] Keeler PA-C Entered By: [name] on 08/28/2017 17:43:42

## 2017-09-02 NOTE — Telephone Encounter (Signed)
Form placed up front for pick up.

## 2017-09-02 NOTE — Telephone Encounter (Signed)
Pt needing to pick up copy of FMLA paperwork. His company did not receive it. He will take it to them. He will pick up today before 5pm.

## 2017-09-03 ENCOUNTER — Other Ambulatory Visit: Payer: Self-pay | Admitting: Sports Medicine

## 2017-09-03 DIAGNOSIS — M545 Low back pain, unspecified: Secondary | ICD-10-CM

## 2017-09-03 DIAGNOSIS — M5136 Other intervertebral disc degeneration, lumbar region: Secondary | ICD-10-CM

## 2017-09-03 DIAGNOSIS — G8929 Other chronic pain: Secondary | ICD-10-CM

## 2017-09-04 ENCOUNTER — Encounter: Payer: 59 | Attending: Physician Assistant | Admitting: Physician Assistant

## 2017-09-04 DIAGNOSIS — F419 Anxiety disorder, unspecified: Secondary | ICD-10-CM | POA: Insufficient documentation

## 2017-09-04 DIAGNOSIS — G4730 Sleep apnea, unspecified: Secondary | ICD-10-CM | POA: Insufficient documentation

## 2017-09-04 DIAGNOSIS — L97822 Non-pressure chronic ulcer of other part of left lower leg with fat layer exposed: Secondary | ICD-10-CM | POA: Insufficient documentation

## 2017-09-04 DIAGNOSIS — I890 Lymphedema, not elsewhere classified: Secondary | ICD-10-CM | POA: Insufficient documentation

## 2017-09-04 DIAGNOSIS — F17200 Nicotine dependence, unspecified, uncomplicated: Secondary | ICD-10-CM | POA: Insufficient documentation

## 2017-09-08 NOTE — Progress Notes (Signed)
LORENSO, QUIRINO ([id number]) Visit Report for 09/04/2017 Arrival Information Details Patient Name: [name], [name]. Date of Service: 09/04/2017 2:45 PM Medical Record Number: [id number] Patient Account Number: [id number] Date of Birth/Sex: 20-Feb-1961 (57 y.o. M) Treating RN: [name] Primary Care Valora Norell: [name] Other Clinician: Referring Leta Bucklin: [name] Treating Parminder Cupples/Extender: [name], [name] Weeks in Treatment: 11 Visit Information History Since Last Visit Added or deleted any medications: No Patient Arrived: Ambulatory Any new allergies or adverse reactions: No Arrival Time: 15:20 Had a fall or experienced change in No Accompanied By: self activities of daily living that may affect Transfer Assistance: None risk of falls: Patient Identification Verified: Yes Signs or symptoms of abuse/neglect since last visito No Secondary Verification Process Completed: Yes Hospitalized since last visit: No Patient Requires Transmission-Based No Has Dressing in Place as Prescribed: Yes Precautions: Pain Present Now: No Patient Has Alerts: No Electronic Signature(s) Signed: 09/07/2017 9:08:37 AM By: [name], [name], RN, CWS, [name], [name] Entered By: [name], [name], RN, CWS, [name] on 09/04/2017 15:20:26 [place], [name] ([id number]) -------------------------------------------------------------------------------- Clinic Level of Care Assessment Details Patient Name: [name], [name]. Date of Service: 09/04/2017 2:45 PM Medical Record Number: [id number] Patient Account Number: [id number] Date of Birth/Sex: Jun 19, 1960 (57 y.o. M) Treating RN: [name] Primary Care Tzion Wedel: [name] Other Clinician: Referring Zayvian Mcmurtry: [name] Treating Willard Farquharson/Extender: [name], [name] Weeks in Treatment: 11 Clinic Level of Care Assessment Items TOOL 4 Quantity Score []  - Use when only an EandM is performed on FOLLOW-UP visit 0 ASSESSMENTS - Nursing Assessment /  Reassessment []  - Reassessment of Co-morbidities (includes updates in patient status) 0 X- 1 5 Reassessment of Adherence to Treatment Plan ASSESSMENTS - Wound and Skin Assessment / Reassessment X - Simple Wound Assessment / Reassessment - one wound 1 5 []  - 0 Complex Wound Assessment / Reassessment - multiple wounds []  - 0 Dermatologic / Skin Assessment (not related to wound area) ASSESSMENTS - Focused Assessment []  - Circumferential Edema Measurements - multi extremities 0 []  - 0 Nutritional Assessment / Counseling / Intervention []  - 0 Lower Extremity Assessment (monofilament, tuning fork, pulses) []  - 0 Peripheral Arterial Disease Assessment (using hand held doppler) ASSESSMENTS - Ostomy and/or Continence Assessment and Care []  - Incontinence Assessment and Management 0 []  - 0 Ostomy Care Assessment and Management (repouching, etc.) PROCESS - Coordination of Care X - Simple Patient / Family Education for ongoing care 1 15 []  - 0 Complex (extensive) Patient / Family Education for ongoing care []  - 0 Staff obtains Programmer, systems, Records, Test Results / Process Orders X- 1 10 Staff telephones HHA, Nursing [name] / Clarify orders / etc []  - 0 Routine Transfer to another Facility (non-emergent condition) []  - 0 Routine Hospital Admission (non-emergent condition) []  - 0 New Admissions / Biomedical engineer / Ordering NPWT, Apligraf, etc. []  - 0 Emergency Hospital Admission (emergent condition) X- 1 10 Simple Discharge Coordination [name], [name]. ([id number]) []  - 0 Complex (extensive) Discharge Coordination PROCESS - Special Needs []  - Pediatric / Minor Patient Management 0 []  - 0 Isolation Patient Management []  - 0 Hearing / Language / Visual special needs []  - 0 Assessment of Community assistance (transportation, D/[name] planning, etc.) []  - 0 Additional assistance / Altered mentation []  - 0 Support Surface(s) Assessment (bed, cushion, seat, etc.) INTERVENTIONS - Wound  Cleansing / Measurement X - Simple Wound Cleansing - one wound 1 5 X- 2 5 Complex Wound Cleansing - multiple wounds X- 1 5 Wound Imaging (photographs - any  number of wounds) []  - 0 Wound Tracing (instead of photographs) []  - 0 Simple Wound Measurement - one wound X- 2 5 Complex Wound Measurement - multiple wounds INTERVENTIONS - Wound Dressings X - Small Wound Dressing one or multiple wounds 2 10 []  - 0 Medium Wound Dressing one or multiple wounds []  - 0 Large Wound Dressing one or multiple wounds []  - 0 Application of Medications - topical []  - 0 Application of Medications - injection INTERVENTIONS - Miscellaneous []  - External ear exam 0 []  - 0 Specimen Collection (cultures, biopsies, blood, body fluids, etc.) []  - 0 Specimen(s) / Culture(s) sent or taken to Lab for analysis []  - 0 Patient Transfer (multiple staff / Civil Service fast streamer / Similar devices) []  - 0 Simple Staple / Suture removal (25 or less) []  - 0 Complex Staple / Suture removal (26 or more) []  - 0 Hypo / Hyperglycemic Management (close monitor of Blood Glucose) []  - 0 Ankle / Brachial Index (ABI) - do not check if billed separately X- 1 5 Vital Signs [name], [name]. ([id number]) Has the patient been seen at the hospital within the last three years: Yes Total Score: 100 Level Of Care: New/Established - Level 3 Electronic Signature(s) Signed: 09/07/2017 9:08:37 AM By: [name], [name], RN, CWS, [name], [name] Entered By: [name], [name], RN, CWS, [name] on 09/04/2017 15:37:28 Steichen, [name] ([id number]) -------------------------------------------------------------------------------- Encounter Discharge Information Details Patient Name: [name], [name]. Date of Service: 09/04/2017 2:45 PM Medical Record Number: [id number] Patient Account Number: [id number] Date of Birth/Sex: 1960-06-04 (57 y.o. M) Treating RN: [name] Primary Care Delona Clasby: [name] Other Clinician: Referring Alieu Finnigan: [name] Treating  Jusiah Aguayo/Extender: [name], [name] Weeks in Treatment: 11 Encounter Discharge Information Items Discharge Pain Level: 0 Discharge Condition: Stable Ambulatory Status: Ambulatory Discharge Destination: Home Transportation: Private Auto Accompanied By: self Schedule Follow-up Appointment: Yes Medication Reconciliation completed and No provided to Patient/Care Rhema Boyett: Patient Clinical Summary of Care: Declined Electronic Signature(s) Signed: 09/07/2017 10:47:07 AM By: Ruthine Dose Entered By: Ruthine Dose on 09/04/2017 15:43:40 Hagner, Fabiano [name]. ([id number]) -------------------------------------------------------------------------------- Lower Extremity Assessment Details Patient Name: [name], [name]. Date of Service: 09/04/2017 2:45 PM Medical Record Number: [id number] Patient Account Number: [id number] Date of Birth/Sex: 05-26-1961 (57 y.o. M) Treating RN: [name] Primary Care Allenmichael Mcpartlin: [name] Other Clinician: Referring Tae Vonada: [name] Treating Myleigh Amara/Extender: [name], [name] Weeks in Treatment: 11 Edema Assessment Assessed: [Left: Yes] [Right: No] Edema: [Left: Ye] [Right: s] Calf Left: Right: Point of Measurement: 37 cm From Medial Instep 40.5 cm cm Ankle Left: Right: Point of Measurement: 12 cm From Medial Instep 27.7 cm cm Vascular Assessment Pulses: Dorsalis Pedis Palpable: [Left:Yes] Posterior Tibial Extremity colors, hair growth, and conditions: Extremity Color: [Left:Normal] Hair Growth on Extremity: [Left:No] Temperature of Extremity: [Left:Warm] Capillary Refill: [Left:< 3 seconds] Toe Nail Assessment Left: Right: Thick: Yes Discolored: Yes Deformed: Yes Improper Length and Hygiene: Yes Electronic Signature(s) Signed: 09/07/2017 9:08:37 AM By: [name], [name], RN, CWS, [name], [name] Entered By: [name], [name], RN, CWS, [name] on 09/04/2017 15:24:23 Day, Jayel Loletha Grayer  ([id number]) -------------------------------------------------------------------------------- Multi Wound Chart Details Patient Name: [name], [name]. Date of Service: 09/04/2017 2:45 PM Medical Record Number: [id number] Patient Account Number: [id number] Date of Birth/Sex: 1960-11-15 (57 y.o. M) Treating RN: [name] Primary Care Mila Pair: [name] Other Clinician: Referring Marleta Lapierre: [name] Treating Johnnathan Hagemeister/Extender: [name], [name] Weeks in Treatment: 11 Vital Signs Height(in): 75 Pulse(bpm): 82 Weight(lbs): 179 Blood Pressure(mmHg): 136/79 Body Mass Index(BMI): 22 Temperature(F): 98.3 Respiratory Rate  16 (breaths/min): Photos: [1:No Photos] [2:No Photos] [N/A:N/A] Wound Location: [1:Left Lower Leg - Medial, Proximal] [2:Left Lower Leg - Medial, Distal N/A] Wounding Event: [1:Gradually Appeared] [2:Gradually Appeared] [N/A:N/A] Primary Etiology: [1:Lymphedema] [2:Lymphedema] [N/A:N/A] Comorbid History: [1:Sleep Apnea, Confinement Anxiety] [2:Sleep Apnea, Confinement Anxiety] [N/A:N/A] Date Acquired: [1:01/17/2017] [2:01/17/2017] [N/A:N/A] Weeks of Treatment: [1:11] [2:11] [N/A:N/A] Wound Status: [1:Open] [2:Open] [N/A:N/A] Measurements L x W x D [1:0.2x0.2x0.1] [2:1x0.8x0.1] [N/A:N/A] (cm) Area (cm) : [1:0.031] [2:0.628] [N/A:N/A] Volume (cm) : [1:0.003] [2:0.063] [N/A:N/A] % Reduction in Area: [1:95.50%] [2:87.70%] [N/A:N/A] % Reduction in Volume: [1:95.70%] [2:87.70%] [N/A:N/A] Classification: [1:Full Thickness Without Exposed Support Structures] [2:Full Thickness Without Exposed Support Structures] [N/A:N/A] Exudate Amount: [1:Medium] [2:Large] [N/A:N/A] Exudate Type: [1:Serosanguineous] [2:Serosanguineous] [N/A:N/A] Exudate Color: [1:red, brown] [2:red, brown] [N/A:N/A] Wound Margin: [1:Flat and Intact] [2:Distinct, outline attached] [N/A:N/A] Granulation Amount: [1:Small (1-33%)] [2:Medium (34-66%)] [N/A:N/A] Granulation Quality: [1:Red]  [2:Red, Hyper-granulation] [N/A:N/A] Necrotic Amount: [1:Large (67-100%)] [2:Medium (34-66%)] [N/A:N/A] Necrotic Tissue: [1:Eschar, Adherent Slough] [2:Adherent Slough] [N/A:N/A] Exposed Structures: [1:Fascia: No Fat Layer (Subcutaneous Tissue) Exposed: No Tendon: No Muscle: No Joint: No Bone: No Limited to Skin Breakdown] [2:Fat Layer (Subcutaneous Tissue) Exposed: Yes Fascia: No Tendon: No Muscle: No Joint: No Bone: No] [N/A:N/A] Epithelialization: [1:None] [2:Small (1-33%)] [N/A:N/A] Periwound Skin Texture: [1:Excoriation: No Induration: No] [2:Excoriation: No Induration: No] [N/A:N/A] Callus: No Callus: No Crepitus: No Crepitus: No Rash: No Rash: No Scarring: No Scarring: No Periwound Skin Moisture: Maceration: No Maceration: No N/A Dry/Scaly: No Dry/Scaly: No Periwound Skin Color: Atrophie Blanche: No Atrophie Blanche: No N/A Cyanosis: No Cyanosis: No Ecchymosis: No Ecchymosis: No Erythema: No Erythema: No Hemosiderin Staining: No Hemosiderin Staining: No Mottled: No Mottled: No Pallor: No Pallor: No Rubor: No Rubor: No Temperature: No Abnormality No Abnormality N/A Tenderness on Palpation: No Yes N/A Wound Preparation: Ulcer Cleansing: Ulcer Cleansing: N/A Rinsed/Irrigated with Saline Rinsed/Irrigated with Saline Topical Anesthetic Applied: Topical Anesthetic Applied: Other: lidocaine 4% Other: lidocaine 4% Treatment Notes Electronic Signature(s) Signed: 09/07/2017 9:08:37 AM By: [name], [name], RN, CWS, [name], [name] Entered By: [name], [name], RN, CWS, [name] on 09/04/2017 15:35:43 Kiernan, [name] ([id number]) -------------------------------------------------------------------------------- Multi-Disciplinary Care Plan Details Patient Name: [name], [name]. Date of Service: 09/04/2017 2:45 PM Medical Record Number: [id number] Patient Account Number: [id number] Date of Birth/Sex: 08/11/1960 (57 y.o. M) Treating RN: [name] Primary Care Ritika Hellickson: [name] Other  Clinician: Referring Kawanna Christley: [name] Treating Cordai Rodrigue/Extender: [name], [name] Weeks in Treatment: 11 Active Inactive ` Orientation to the Wound Care Program Nursing Diagnoses: Knowledge deficit related to the wound healing center program Goals: Patient/caregiver will verbalize understanding of the [facility] Program Date Initiated: 06/19/2017 Target Resolution Date: 07/10/2017 Goal Status: Active Interventions: Provide education on orientation to the wound center Notes: ` Wound/Skin Impairment Nursing Diagnoses: Impaired tissue integrity Goals: Patient/caregiver will verbalize understanding of skin care regimen Date Initiated: 06/19/2017 Target Resolution Date: 07/10/2017 Goal Status: Active Ulcer/skin breakdown will have a volume reduction of 30% by week 4 Date Initiated: 06/19/2017 Target Resolution Date: 07/10/2017 Goal Status: Active Interventions: Assess patient/caregiver ability to obtain necessary supplies Assess patient/caregiver ability to perform ulcer/skin care regimen upon admission and as needed Assess ulceration(s) every visit Treatment Activities: Skin care regimen initiated : 06/19/2017 Notes: Electronic Signature(s) Signed: 09/07/2017 9:08:37 AM By: [name], [name], RN, CWS, [name], [name] [name], [name] ([id number]) Entered By: [name], [name], RN, CWS, [name] on 09/04/2017 15:35:35 [name], [name]. ([id number]) -------------------------------------------------------------------------------- Pain Assessment Details Patient Name: [name], [name]. Date of Service: 09/04/2017 2:45 PM Medical Record Number: [id number] Patient  Account Number: [id number] Date of Birth/Sex: [birth year] (57 y.o. M) Treating RN: [name] Primary Care Selita Staiger: [name] Other Clinician: Referring Tristen Luce: [name] Treating Berdine Rasmusson/Extender: [name], [name] Weeks in Treatment: 11 Active Problems Location of Pain Severity and Description of Pain Patient  Has Paino No Site Locations With Dressing Change: No Pain Management and Medication Current Pain Management: Electronic Signature(s) Signed: 09/07/2017 9:08:37 AM By: [name], [name], RN, CWS, [name], [name] Entered By: [name], [name], RN, CWS, [name] on 09/04/2017 15:20:32 [name] ([id number]) -------------------------------------------------------------------------------- Patient/Caregiver Education Details Patient Name: Bardin, Omaree [name]. Date of Service: 09/04/2017 2:45 PM Medical Record Number: [id number] Patient Account Number: [id number] Date of Birth/Gender: 13-May-1961 (57 y.o. M) Treating RN: [name] Primary Care Physician: [name] Other Clinician: Referring Physician: [name] Treating Physician/Extender: Sharalyn Ink in Treatment: 11 Education Assessment Education Provided To: Patient Education Topics Provided Wound/Skin Impairment: Handouts: Caring for Your Ulcer Methods: Demonstration, Explain/Verbal Responses: State content correctly Electronic Signature(s) Signed: 09/07/2017 9:08:37 AM By: [name], [name], RN, CWS, [name], [name] Entered By: [name], [name], RN, CWS, [name] on 09/04/2017 15:38:22 [name], [name] ([id number]) -------------------------------------------------------------------------------- Wound Assessment Details Patient Name: [name], [name]. Date of Service: 09/04/2017 2:45 PM Medical Record Number: [id number] Patient Account Number: [id number] Date of Birth/Sex: [birth year] (57 y.o. M) Treating RN: [name] Primary Care Elliana Bal: [name] Other Clinician: Referring  Cohick: [name] Treating Vista Sawatzky/Extender: [name], [name] Weeks in Treatment: 11 Wound Status Wound Number: 1 Primary Etiology: Lymphedema Wound Location: Left Lower Leg - Medial, Proximal Wound Status: Open Wounding Event: Gradually Appeared Comorbid History: Sleep Apnea, Confinement Anxiety Date Acquired: 01/17/2017 Weeks Of Treatment: 11 Clustered  Wound: No Photos Photo Uploaded By: Roger Shelter on 09/04/2017 16:03:06 Wound Measurements Length: (cm) 0.2 Width: (cm) 0.2 Depth: (cm) 0.1 Area: (cm) 0.031 Volume: (cm) 0.003 % Reduction in Area: 95.5% % Reduction in Volume: 95.7% Epithelialization: None Tunneling: No Undermining: No Wound Description Full Thickness Without Exposed Support Classification: Structures Wound Margin: Flat and Intact Exudate Medium Amount: Exudate Type: Serosanguineous Exudate Color: red, brown Foul Odor After Cleansing: No Slough/Fibrino No Wound Bed Granulation Amount: Small (1-33%) Exposed Structure Granulation Quality: Red Fascia Exposed: No Necrotic Amount: Large (67-100%) Fat Layer (Subcutaneous Tissue) Exposed: No Necrotic Quality: Eschar, Adherent Slough Tendon Exposed: No Muscle Exposed: No Joint Exposed: No Bone Exposed: No Quattrone, Oval [name]. ([id number]) Limited to Skin Breakdown Periwound Skin Texture Texture Color No Abnormalities Noted: No No Abnormalities Noted: No Callus: No Atrophie Blanche: No Crepitus: No Cyanosis: No Excoriation: No Ecchymosis: No Induration: No Erythema: No Rash: No Hemosiderin Staining: No Scarring: No Mottled: No Pallor: No Moisture Rubor: No No Abnormalities Noted: No Dry / Scaly: No Temperature / Pain Maceration: No Temperature: No Abnormality Wound Preparation Ulcer Cleansing: Rinsed/Irrigated with Saline Topical Anesthetic Applied: Other: lidocaine 4%, Treatment Notes Wound #1 (Left, Proximal, Medial Lower Leg) 1. Cleansed with: Clean wound with Normal Saline 2. Anesthetic Topical Lidocaine 4% cream to wound bed prior to debridement 4. Dressing Applied: Prisma Ag 5. Secondary Dressing Applied Bordered Foam Dressing 7. Secured with Patient to wear own compression stockings Electronic Signature(s) Signed: 09/07/2017 9:08:37 AM By: [name], [name], RN, CWS, [name], [name] Entered By: [name], [name], RN, CWS, [name] on 09/04/2017  15:21:59 Aldaco, [name] ([id number]) -------------------------------------------------------------------------------- Wound Assessment Details Patient Name: Shumard, Lonzo [name]. Date of Service: 09/04/2017 2:45 PM Medical Record Number: [id number] Patient Account Number: [id number] Date of Birth/Sex: [birth year] (57 y.o. M) Treating RN: [name] Primary Care Flint Hakeem:  MCLEAN-SCOCOZZA, TRACY Other Clinician: Referring Denzel Etienne: [name] Treating Aaleeyah Bias/Extender: STONE III, [name] Weeks in Treatment: 11 Wound Status Wound Number: 2 Primary Etiology: Lymphedema Wound Location: Left Lower Leg - Medial, Distal Wound Status: Open Wounding Event: Gradually Appeared Comorbid History: Sleep Apnea, Confinement Anxiety Date Acquired: 01/17/2017 Weeks Of Treatment: 11 Clustered Wound: No Photos Photo Uploaded By: Roger Shelter on 09/04/2017 16:03:07 Wound Measurements Length: (cm) 1 Width: (cm) 0.8 Depth: (cm) 0.1 Area: (cm) 0.628 Volume: (cm) 0.063 % Reduction in Area: 87.7% % Reduction in Volume: 87.7% Epithelialization: Small (1-33%) Tunneling: No Undermining: No Wound Description Full Thickness Without Exposed Support Classification: Structures Wound Margin: Distinct, outline attached Exudate Large Amount: Exudate Type: Serosanguineous Exudate Color: red, brown Foul Odor After Cleansing: No Slough/Fibrino Yes Wound Bed Granulation Amount: Medium (34-66%) Exposed Structure Granulation Quality: Red, Hyper-granulation Fascia Exposed: No Necrotic Amount: Medium (34-66%) Fat Layer (Subcutaneous Tissue) Exposed: Yes Necrotic Quality: Adherent Slough Tendon Exposed: No Muscle Exposed: No Joint Exposed: No Bone Exposed: No Manzo, Ihan [name]. ([id number]) Periwound Skin Texture Texture Color No Abnormalities Noted: No No Abnormalities Noted: No Callus: No Atrophie Blanche: No Crepitus: No Cyanosis: No Excoriation: No Ecchymosis: No Induration: No Erythema:  No Rash: No Hemosiderin Staining: No Scarring: No Mottled: No Pallor: No Moisture Rubor: No No Abnormalities Noted: No Dry / Scaly: No Temperature / Pain Maceration: No Temperature: No Abnormality Tenderness on Palpation: Yes Wound Preparation Ulcer Cleansing: Rinsed/Irrigated with Saline Topical Anesthetic Applied: Other: lidocaine 4%, Treatment Notes Wound #2 (Left, Distal, Medial Lower Leg) 1. Cleansed with: Clean wound with Normal Saline 2. Anesthetic Topical Lidocaine 4% cream to wound bed prior to debridement 4. Dressing Applied: Prisma Ag 5. Secondary Dressing Applied Bordered Foam Dressing 7. Secured with Patient to wear own compression stockings Electronic Signature(s) Signed: 09/07/2017 9:08:37 AM By: [name], [name], RN, CWS, [name], [name] Entered By: [name], [name], RN, CWS, [name] on 09/04/2017 15:22:44 [place], [name] ([id number]) -------------------------------------------------------------------------------- Vitals Details Patient Name: Frenkel, Deondray [name]. Date of Service: 09/04/2017 2:45 PM Medical Record Number: [id number] Patient Account Number: [id number] Date of Birth/Sex: [birth year] (57 y.o. M) Treating RN: [name] Primary Care Ogden Handlin: [name] Other Clinician: Referring Hilja Kintzel: [name] Treating Jillayne Witte/Extender: [name], [name] Weeks in Treatment: 11 Vital Signs Time Taken: 15:20 Temperature (F): 98.3 Height (in): 75 Pulse (bpm): 82 Weight (lbs): 179 Respiratory Rate (breaths/min): 16 Body Mass Index (BMI): 22.4 Blood Pressure (mmHg): 136/79 Reference Range: 80 - 120 mg / dl Electronic Signature(s) Signed: 09/07/2017 9:08:37 AM By: [name], [name], RN, CWS, [name], [name] Entered By: [name], [name], RN, CWS, [name] on 09/04/2017 15:20:56

## 2017-09-08 NOTE — Progress Notes (Signed)
PLEZ, BELTON ([id number]) Visit Report for 09/04/2017 Chief Complaint Document Details Patient Name: [name], [name] Date of Service: 09/04/2017 2:45 PM Medical Record Number: [id number] Patient Account Number: [id number] Date of Birth/Sex: 1961/02/19 (57 y.o. M) Treating RN: [name] Primary Care Provider: Myrtie [name] Other Clinician: Referring Provider: Myrtie [name] Treating Provider/Extender: [name] Hake, Myra Weng Weeks in Treatment: 11 Information Obtained from: Patient Chief Complaint He is here in follow up for lle venous ulcers Electronic Signature(s) Signed: 09/04/2017 5:57:55 PM By: Worthy Keeler [name] Entered By: Worthy Keeler on 09/04/2017 15:07:14 [name], [name] ([id number]) -------------------------------------------------------------------------------- HPI Details Patient Name: [name], [name] Date of Service: 09/04/2017 2:45 PM Medical Record Number: [id number] Patient Account Number: [id number] Date of Birth/Sex: August 27, 1960 (57 y.o. M) Treating RN: [name] Primary Care Provider: Myrtie [name] Other Clinician: Referring Provider: Myrtie [name] Treating Provider/Extender: [name] Weeks in Treatment: 11 History of Present Illness HPI Description: 06/25/17-he is here in follow-up evaluation for left lower extremity, medial malleolus, venous ulcers. He is compliant with compression stocking where although his compression stocking does not fit appropriately in length. He has been advised to contact elastic therapy, who measured and provided his current stockings, regarding a more appropriate fitted stocking. He is voicing no complaints or concerns, tolerated debridement. There is improvement in appearance, no significant change in measurements. We will continue with Santyl ointment, will apply to all 3 wounds. He will follow-up next week 06/30/17-he is here earlier than his scheduled appointment on Friday, per his request, secondary to  new open areas. After inquiry, it appears that the new superficial open areas are secondary to adhesive removal. There is no evidence of infection, minimal/scant drainage, most of the new areas are epithelialized. His chronic wounds are stable in appearance, will continue with Santyl. He wants to maintain Friday appointment and will follow-up next Friday, he was encouraged to contact clinic with any ulcer changes or concerns 07/24/17 On evaluation today patient appears to be doing well in regard to left lower extremity ulcer. He has been tolerating the dressing changes without complication. He still continues to use the [place] which does seem to be helpful for him this is excellent news. Definitely no evidence of infection today which is good news. Overall I'm pleased with how things are going. Patient likewise is also pleased. 07/31/17 on evaluation today patient's wound on the left medial lower extremity appears to be doing okay although he is having a little bit of maceration noted at this point. Obviously this may be due to the drainage coupled with the fact we have been using the Santyl and trying to keep this voice. Obviously it looks as if the wound is a little bit too macerated at this time. Fortunately there does not appear to be any evidence of infection which is good news. No fevers, chills, nausea, or vomiting noted at this time. 08/21/17 on evaluation today patient appears to be doing very well in regard to his left lower extremity ulcers. In fact one of the two these have completely healed at this point. The other is doing much better it is smaller and there is good epithelialization no significant adherent slough noted at this point. He has been tolerating the dressing changes without complication. There does not appear to be any evidence of infection. Unfortunately since I last saw him he has been in the hospital due to pneumonia. The good news is he seems to be improving following his  hospital stay he still seems a  little bit fatigued in my opinion he states that is indeed the case. Overall the wound does appear to be better. 09/04/17 on evaluation today patient appears to be doing very well in regard to his left lower extremity ulcer. He has been tolerating the dressing changes without complication he continues to make progress week by week which is good news. Overall he states the only complication he sees is that he wishes this will move along more quickly. Electronic Signature(s) Signed: 09/04/2017 5:57:55 PM By: Worthy Keeler [name] Entered By: Worthy Keeler on 09/04/2017 17:14:26 [name], [name] Kitchen ([id number]) -------------------------------------------------------------------------------- Physical Exam Details Patient Name: [name] C. Date of Service: 09/04/2017 2:45 PM Medical Record Number: [id number] Patient Account Number: [id number] Date of Birth/Sex: 04-29-1961 (57 y.o. M) Treating RN: [name] Primary Care Provider: Myrtie [name] Other Clinician: Referring Provider: Myrtie [name] Treating Provider/Extender: [name] Weeks in Treatment: 29 Constitutional Well-nourished and well-hydrated in no acute distress. Respiratory normal breathing without difficulty. clear to auscultation bilaterally. Cardiovascular regular rate and rhythm with normal S1, S2. trace pitting edema of the bilateral lower extremities. Psychiatric this patient is able to make decisions and demonstrates good insight into disease process. Alert and Oriented x 3. pleasant and cooperative. Notes Patient's wound shows an excellent granulation surface which did not require sharp debridement today. I did cleanse this with saline and gauze to mechanically debride the wound he tolerated this well without pain. Electronic Signature(s) Signed: 09/04/2017 5:57:55 PM By: Worthy Keeler [name] Entered By: Worthy Keeler on 09/04/2017 17:15:13 [name], [name] Kitchen  ([id number]) -------------------------------------------------------------------------------- Physician Orders Details Patient Name: [name] C. Date of Service: 09/04/2017 2:45 PM Medical Record Number: [id number] Patient Account Number: [id number] Date of Birth/Sex: June 27, 1960 (57 y.o. M) Treating RN: [name] Primary Care Provider: Myrtie [name] Other Clinician: Referring Provider: Myrtie [name] Treating Provider/Extender: [name] Hake, Khoi Hamberger Weeks in Treatment: 11 Verbal / Phone Orders: No Diagnosis Coding ICD-10 Coding Code Description I89.0 Lymphedema, not elsewhere classified L97.822 Non-pressure chronic ulcer of other part of left lower leg with fat layer exposed E66.09 Other obesity due to excess calories Wound Cleansing Wound #1 Left,Proximal,Medial Lower Leg o Clean wound with Normal Saline. Wound #2 Left,Distal,Medial Lower Leg o Clean wound with Normal Saline. Anesthetic (add to Medication List) Wound #1 Left,Proximal,Medial Lower Leg o Topical Lidocaine 4% cream applied to wound bed prior to debridement (In Clinic Only). Wound #2 Left,Distal,Medial Lower Leg o Topical Lidocaine 4% cream applied to wound bed prior to debridement (In Clinic Only). Primary Wound Dressing Wound #1 Left,Proximal,Medial Lower Leg o Silver Collagen Wound #2 Left,Distal,Medial Lower Leg o Silver Collagen Secondary Dressing Wound #1 Left,Proximal,Medial Lower Leg o Boardered Foam Dressing Wound #2 Left,Distal,Medial Lower Leg o Boardered Foam Dressing Dressing Change Frequency Wound #1 Left,Proximal,Medial Lower Leg o Change dressing every other day. Wound #2 Left,Distal,Medial Lower Leg o Change dressing every other day. Follow-up Appointments [name], [name] ([id number]) Wound #1 Left,Proximal,Medial Lower Leg o Return Appointment in 1 week. Wound #2 Left,Distal,Medial Lower Leg o Return Appointment in 1 week. Edema Control Wound #1  Left,Proximal,Medial Lower Leg o Patient to wear own compression stockings Wound #2 Left,Distal,Medial Lower Leg o Patient to wear own compression stockings Electronic Signature(s) Signed: 09/04/2017 5:57:55 PM By: Worthy Keeler [name] Signed: 09/07/2017 9:08:37 AM By: [name], [name], RN, CWS, [name], [name] Entered By: [name], [name], RN, CWS, [name] on 09/04/2017 15:36:32 [name], [name] ([id number]) -------------------------------------------------------------------------------- Problem List Details Patient Name: Riepe, Herbie C. Date of Service:  09/04/2017 2:45 PM Medical Record Number: [id number] Patient Account Number: [id number] Date of Birth/Sex: 1960-11-21 (57 y.o. M) Treating RN: [name] Primary Care Provider: Myrtie [name] Other Clinician: Referring Provider: Myrtie [name] Treating Provider/Extender: [name] Hake, Mikai Meints Weeks in Treatment: 11 Active Problems ICD-10 Impacting Encounter Code Description Active Date Wound Healing Diagnosis I89.0 Lymphedema, not elsewhere classified 06/22/2017 Yes L97.822 Non-pressure chronic ulcer of other part of left lower leg with 06/22/2017 Yes fat layer exposed E66.09 Other obesity due to excess calories 06/22/2017 Yes Inactive Problems Resolved Problems Electronic Signature(s) Signed: 09/04/2017 5:57:55 PM By: Worthy Keeler [name] Entered By: Worthy Keeler on 09/04/2017 15:07:06 [name], [name] ([id number]) -------------------------------------------------------------------------------- Progress Note Details Patient Name: [name], [name] Date of Service: 09/04/2017 2:45 PM Medical Record Number: [id number] Patient Account Number: [id number] Date of Birth/Sex: 04/11/1961 (57 y.o. M) Treating RN: [name] Primary Care Provider: Myrtie [name] Other Clinician: Referring Provider: Myrtie [name] Treating Provider/Extender: [name] Hake, Arlie Riker Weeks in Treatment: 11 Subjective Chief Complaint Information obtained from  Patient He is here in follow up for lle venous ulcers History of Present Illness (HPI) 06/25/17-he is here in follow-up evaluation for left lower extremity, medial malleolus, venous ulcers. He is compliant with compression stocking where although his compression stocking does not fit appropriately in length. He has been advised to contact elastic therapy, who measured and provided his current stockings, regarding a more appropriate fitted stocking. He is voicing no complaints or concerns, tolerated debridement. There is improvement in appearance, no significant change in measurements. We will continue with Santyl ointment, will apply to all 3 wounds. He will follow-up next week 06/30/17-he is here earlier than his scheduled appointment on Friday, per his request, secondary to new open areas. After inquiry, it appears that the new superficial open areas are secondary to adhesive removal. There is no evidence of infection, minimal/scant drainage, most of the new areas are epithelialized. His chronic wounds are stable in appearance, will continue with Santyl. He wants to maintain Friday appointment and will follow-up next Friday, he was encouraged to contact clinic with any ulcer changes or concerns 07/24/17 On evaluation today patient appears to be doing well in regard to left lower extremity ulcer. He has been tolerating the dressing changes without complication. He still continues to use the [place] which does seem to be helpful for him this is excellent news. Definitely no evidence of infection today which is good news. Overall I'm pleased with how things are going. Patient likewise is also pleased. 07/31/17 on evaluation today patient's wound on the left medial lower extremity appears to be doing okay although he is having a little bit of maceration noted at this point. Obviously this may be due to the drainage coupled with the fact we have been using the Santyl and trying to keep this voice.  Obviously it looks as if the wound is a little bit too macerated at this time. Fortunately there does not appear to be any evidence of infection which is good news. No fevers, chills, nausea, or vomiting noted at this time. 08/21/17 on evaluation today patient appears to be doing very well in regard to his left lower extremity ulcers. In fact one of the two these have completely healed at this point. The other is doing much better it is smaller and there is good epithelialization no significant adherent slough noted at this point. He has been tolerating the dressing changes without complication. There does not appear to be any evidence of infection. Unfortunately since  I last saw him he has been in the hospital due to pneumonia. The good news is he seems to be improving following his hospital stay he still seems a little bit fatigued in my opinion he states that is indeed the case. Overall the wound does appear to be better. 09/04/17 on evaluation today patient appears to be doing very well in regard to his left lower extremity ulcer. He has been tolerating the dressing changes without complication he continues to make progress week by week which is good news. Overall he states the only complication he sees is that he wishes this will move along more quickly. Patient History Information obtained from Patient. Family History Cancer - Father, Heart Disease - Father,Siblings, No family history of Diabetes, Hereditary Spherocytosis, Hypertension, Kidney Disease, Lung Disease, Seizures, Stroke, Thyroid Problems, Tuberculosis. [name], [name] ([id number]) Social History Current some day smoker, Marital Status - Separated, Alcohol Use - Rarely, Drug Use - No History, Caffeine Use - Daily. Review of Systems (ROS) Constitutional Symptoms (General Health) Denies complaints or symptoms of Fever, Chills. Respiratory The patient has no complaints or symptoms. Cardiovascular Complains or has symptoms of LE  edema. Psychiatric The patient has no complaints or symptoms. Objective Constitutional Well-nourished and well-hydrated in no acute distress. Vitals Time Taken: 3:20 PM, Height: 75 in, Weight: 179 lbs, BMI: 22.4, Temperature: 98.3 F, Pulse: 82 bpm, Respiratory Rate: 16 breaths/min, Blood Pressure: 136/79 mmHg. Respiratory normal breathing without difficulty. clear to auscultation bilaterally. Cardiovascular regular rate and rhythm with normal S1, S2. trace pitting edema of the bilateral lower extremities. Psychiatric this patient is able to make decisions and demonstrates good insight into disease process. Alert and Oriented x 3. pleasant and cooperative. General Notes: Patient's wound shows an excellent granulation surface which did not require sharp debridement today. I did cleanse this with saline and gauze to mechanically debride the wound he tolerated this well without pain. Integumentary (Hair, Skin) Wound #1 status is Open. Original cause of wound was Gradually Appeared. The wound is located on the Left,Proximal,Medial Lower Leg. The wound measures 0.2cm length x 0.2cm width x 0.1cm depth; 0.031cm^2 area and 0.003cm^3 volume. The wound is limited to skin breakdown. There is no tunneling or undermining noted. There is a medium amount of serosanguineous drainage noted. The wound margin is flat and intact. There is small (1-33%) red granulation within the wound bed. There is a large (67-100%) amount of necrotic tissue within the wound bed including Eschar and Adherent Slough. The periwound skin appearance did not exhibit: Callus, Crepitus, Excoriation, Induration, Rash, Scarring, Dry/Scaly, Maceration, Atrophie Blanche, Cyanosis, Ecchymosis, Hemosiderin Staining, Mottled, Pallor, Rubor, Erythema. Periwound temperature was noted as No Abnormality. Wound #2 status is Open. Original cause of wound was Gradually Appeared. The wound is located on the Left,Distal,Medial Lower Leg. The  wound measures 1cm length x 0.8cm width x 0.1cm depth; 0.628cm^2 area and 0.063cm^3 volume. There is Fat Layer (Subcutaneous Tissue) Exposed exposed. There is no tunneling or undermining noted. There is a large amount of serosanguineous drainage noted. The wound margin is distinct with the outline attached to the wound base. There is medium [name], [name] ([id number]) (34-66%) red, hyper - granulation within the wound bed. There is a medium (34-66%) amount of necrotic tissue within the wound bed including Adherent Slough. The periwound skin appearance did not exhibit: Callus, Crepitus, Excoriation, Induration, Rash, Scarring, Dry/Scaly, Maceration, Atrophie Blanche, Cyanosis, Ecchymosis, Hemosiderin Staining, Mottled, Pallor, Rubor, Erythema. Periwound temperature was noted as No Abnormality. The periwound has  tenderness on palpation. Assessment Active Problems ICD-10 I89.0 - Lymphedema, not elsewhere classified L97.822 - Non-pressure chronic ulcer of other part of left lower leg with fat layer exposed E66.09 - Other obesity due to excess calories Plan Wound Cleansing: Wound #1 Left,Proximal,Medial Lower Leg: Clean wound with Normal Saline. Wound #2 Left,Distal,Medial Lower Leg: Clean wound with Normal Saline. Anesthetic (add to Medication List): Wound #1 Left,Proximal,Medial Lower Leg: Topical Lidocaine 4% cream applied to wound bed prior to debridement (In Clinic Only). Wound #2 Left,Distal,Medial Lower Leg: Topical Lidocaine 4% cream applied to wound bed prior to debridement (In Clinic Only). Primary Wound Dressing: Wound #1 Left,Proximal,Medial Lower Leg: Silver Collagen Wound #2 Left,Distal,Medial Lower Leg: Silver Collagen Secondary Dressing: Wound #1 Left,Proximal,Medial Lower Leg: Boardered Foam Dressing Wound #2 Left,Distal,Medial Lower Leg: Boardered Foam Dressing Dressing Change Frequency: Wound #1 Left,Proximal,Medial Lower Leg: Change dressing every other day. Wound #2  Left,Distal,Medial Lower Leg: Change dressing every other day. Follow-up Appointments: Wound #1 Left,Proximal,Medial Lower Leg: Return Appointment in 1 week. Wound #2 Left,Distal,Medial Lower Leg: Return Appointment in 1 week. Edema Control: Wound #1 Left,Proximal,Medial Lower Leg: Patient to wear own compression stockings Vandenbos, Angelica C. ([id number]) Wound #2 Left,Distal,Medial Lower Leg: Patient to wear own compression stockings I am going to suggest at this point that we continue with the Current wound care measures since he seems to be doing so well. Patient is in agreement with plan. Please see above for specific wound care orders. We will see patient for re-evaluation in 1 week(s) here in the clinic. If anything worsens or changes patient will contact our office for additional recommendations. Electronic Signature(s) Signed: 09/04/2017 5:57:55 PM By: Worthy Keeler [name] Entered By: Worthy Keeler on 09/04/2017 17:15:26 [name], [name] Kitchen ([id number]) -------------------------------------------------------------------------------- ROS/PFSH Details Patient Name: [name], Euclide C. Date of Service: 09/04/2017 2:45 PM Medical Record Number: [id number] Patient Account Number: [id number] Date of Birth/Sex: [birth year] (57 y.o. M) Treating RN: [name] Primary Care Provider: Myrtie [name] Other Clinician: Referring Provider: Myrtie [name] Treating Provider/Extender: [name] Hake, Veronnica Hennings Weeks in Treatment: 11 Information Obtained From Patient Wound History Do you currently have one or more open woundso Yes How many open wounds do you currently haveo 3 Approximately how long have you had your woundso several months How have you been treating your wound(s) until nowo xeroform gauze Has your wound(s) ever healed and then re-openedo No Have you had any lab work done in the past montho No Have you tested positive for an antibiotic resistant organism (MRSA, VRE)o No Have you tested  positive for osteomyelitis (bone infection)o No Have you had any tests for circulation on your legso No Constitutional Symptoms (General Health) Complaints and Symptoms: Negative for: Fever; Chills Cardiovascular Complaints and Symptoms: Positive for: LE edema Medical History: Negative for: Arrhythmia; Congestive Heart Failure; Coronary Artery Disease; Deep Vein Thrombosis; Hypertension; Hypotension; Myocardial Infarction; Peripheral Arterial Disease; Peripheral Venous Disease; Phlebitis; Vasculitis Eyes Medical History: Negative for: Cataracts; Glaucoma; Optic Neuritis Hematologic/Lymphatic Medical History: Negative for: Anemia; Hemophilia; Human Immunodeficiency Virus; Lymphedema; Sickle Cell Disease Respiratory Complaints and Symptoms: No Complaints or Symptoms Medical History: Positive for: Sleep Apnea Negative for: Aspiration; Asthma; Chronic Obstructive Pulmonary Disease (COPD); Pneumothorax; Tuberculosis Gastrointestinal Nero, Maven C. ([id number]) Medical History: Negative for: Cirrhosis ; Colitis; Crohnos; Hepatitis A; Hepatitis B; Hepatitis C Endocrine Medical History: Negative for: Type I Diabetes; Type II Diabetes Genitourinary Medical History: Negative for: End Stage Renal Disease Immunological Medical History: Negative for: Lupus Erythematosus; Raynaudos; Scleroderma Integumentary (Skin) Medical History: Negative for:  History of Burn Musculoskeletal Medical History: Negative for: Gout; Rheumatoid Arthritis; Osteoarthritis; Osteomyelitis Neurologic Medical History: Negative for: Dementia; Neuropathy; Paraplegia; Seizure Disorder Psychiatric Complaints and Symptoms: No Complaints or Symptoms Medical History: Positive for: Confinement Anxiety Negative for: Anorexia/bulimia Immunizations Pneumococcal Vaccine: Received Pneumococcal Vaccination: No Implantable Devices Family and Social History Cancer: Yes - Father; Diabetes: No; Heart Disease: Yes -  Father,Siblings; Hereditary Spherocytosis: No; Hypertension: No; Kidney Disease: No; Lung Disease: No; Seizures: No; Stroke: No; Thyroid Problems: No; Tuberculosis: No; Current some day smoker; Marital Status - Separated; Alcohol Use: Rarely; Drug Use: No History; Caffeine Use: Daily; Financial Concerns: No; Food, Clothing or Shelter Needs: No; Support System Lacking: No; Transportation Concerns: No; Advanced Directives: No; Patient does not want information on Advanced Directives; Do not resuscitate: No; Living Will: No; Medical Power of Attorney: No Physician Affirmation I have reviewed and agree with the above information. BOBIE, KISTLER ([id number]) Electronic Signature(s) Signed: 09/04/2017 5:57:55 PM By: Worthy Keeler [name] Signed: 09/07/2017 4:12:39 PM By: [name] Entered By: Worthy Keeler on 09/04/2017 17:14:47 Smalling, Lexx C. ([id number]) -------------------------------------------------------------------------------- SuperBill Details Patient Name: Mansour, Abdinasir C. Date of Service: 09/04/2017 Medical Record Number: [id number] Patient Account Number: [id number] Date of Birth/Sex: September 21, 1960 (56 y.o. M) Treating RN: [name] Primary Care Provider: Myrtie [name] Other Clinician: Referring Provider: Myrtie [name] Treating Provider/Extender: [name] Hake, Taren Toops Weeks in Treatment: 11 Diagnosis Coding ICD-10 Codes Code Description I89.0 Lymphedema, not elsewhere classified L97.822 Non-pressure chronic ulcer of other part of left lower leg with fat layer exposed E66.09 Other obesity due to excess calories Facility Procedures CPT4 Code: [id number] Description: 99213 - WOUND CARE VISIT-LEV 3 EST PT Modifier: Quantity: 1 Physician Procedures CPT4 Code Description: [id number] 99213 - WC PHYS LEVEL 3 - EST PT ICD-10 Diagnosis Description I89.0 Lymphedema, not elsewhere classified L97.822 Non-pressure chronic ulcer of other part of left lower leg wit E66.09 Other obesity due  to excess calories Modifier: h fat layer expos Quantity: 1 ed Electronic Signature(s) Signed: 09/04/2017 5:57:55 PM By: Worthy Keeler [name] Entered By: Worthy Keeler on 09/04/2017 17:15:40

## 2017-09-09 ENCOUNTER — Other Ambulatory Visit: Payer: Self-pay

## 2017-09-11 ENCOUNTER — Encounter: Payer: 59 | Admitting: Physician Assistant

## 2017-09-11 DIAGNOSIS — L97822 Non-pressure chronic ulcer of other part of left lower leg with fat layer exposed: Secondary | ICD-10-CM | POA: Diagnosis not present

## 2017-09-14 ENCOUNTER — Ambulatory Visit
Admission: RE | Admit: 2017-09-14 | Discharge: 2017-09-14 | Disposition: A | Discharge status: Home / Self Care | Payer: 59 | Source: Ambulatory Visit | Attending: Sports Medicine | Admitting: Sports Medicine

## 2017-09-14 DIAGNOSIS — M545 Low back pain: Secondary | ICD-10-CM

## 2017-09-14 DIAGNOSIS — G8929 Other chronic pain: Secondary | ICD-10-CM

## 2017-09-14 DIAGNOSIS — M48061 Spinal stenosis, lumbar region without neurogenic claudication: Secondary | ICD-10-CM | POA: Insufficient documentation

## 2017-09-14 DIAGNOSIS — M5136 Other intervertebral disc degeneration, lumbar region: Secondary | ICD-10-CM | POA: Diagnosis not present

## 2017-09-14 DIAGNOSIS — M5126 Other intervertebral disc displacement, lumbar region: Secondary | ICD-10-CM | POA: Diagnosis not present

## 2017-09-15 NOTE — Progress Notes (Signed)
KIMBALL, APPLEBY ([id number]) Visit Report for 09/11/2017 Arrival Information Details Patient Name: Oetken, Asad C. Date of Service: 09/11/2017 3:00 PM Medical Record Number: [id number] Patient Account Number: [id number] Date of Birth/Sex: 02/23/1961 (57 y.o. M) Treating RN: Roger Shelter Primary Care Conner Neiss: [name] Other Clinician: Referring Cianni Manny: [name] Treating Sritha Chauncey/Extender: [name], [name] Weeks in Treatment: 12 Visit Information History Since Last Visit All ordered tests and consults were completed: No Patient Arrived: Ambulatory Added or deleted any medications: No Arrival Time: 15:07 Any new allergies or adverse reactions: No Accompanied By: self Had a fall or experienced change in No Transfer Assistance: None activities of daily living that may affect Patient Identification Verified: Yes risk of falls: Secondary Verification Process Completed: Yes Signs or symptoms of abuse/neglect since last visito No Patient Requires Transmission-Based No Hospitalized since last visit: No Precautions: Implantable device outside of the clinic excluding No Patient Has Alerts: No cellular tissue based products placed in the center since last visit: Pain Present Now: No Electronic Signature(s) Signed: 09/11/2017 4:04:12 PM By: Roger Shelter Entered By: Roger Shelter on 09/11/2017 15:08:04 Britain, Trevone C. ([id number]) -------------------------------------------------------------------------------- Clinic Level of Care Assessment Details Patient Name: Zerkle, Tyrese C. Date of Service: 09/11/2017 3:00 PM Medical Record Number: [id number] Patient Account Number: [id number] Date of Birth/Sex: 1961-02-26 (57 y.o. M) Treating RN: Montey Hora Primary Care Kamoria Lucien: [name] Other Clinician: Referring Alexie Lanni: [name] Treating Horst Ostermiller/Extender: [name], [name] Weeks in Treatment: 12 Clinic Level of Care Assessment Items TOOL  4 Quantity Score []  - Use when only an EandM is performed on FOLLOW-UP visit 0 ASSESSMENTS - Nursing Assessment / Reassessment X - Reassessment of Co-morbidities (includes updates in patient status) 1 10 X- 1 5 Reassessment of Adherence to Treatment Plan ASSESSMENTS - Wound and Skin Assessment / Reassessment X - Simple Wound Assessment / Reassessment - one wound 1 5 []  - 0 Complex Wound Assessment / Reassessment - multiple wounds []  - 0 Dermatologic / Skin Assessment (not related to wound area) ASSESSMENTS - Focused Assessment []  - Circumferential Edema Measurements - multi extremities 0 []  - 0 Nutritional Assessment / Counseling / Intervention X- 1 5 Lower Extremity Assessment (monofilament, tuning fork, pulses) []  - 0 Peripheral Arterial Disease Assessment (using hand held doppler) ASSESSMENTS - Ostomy and/or Continence Assessment and Care []  - Incontinence Assessment and Management 0 []  - 0 Ostomy Care Assessment and Management (repouching, etc.) PROCESS - Coordination of Care X - Simple Patient / Family Education for ongoing care 1 15 []  - 0 Complex (extensive) Patient / Family Education for ongoing care []  - 0 Staff obtains Programmer, systems, Records, Test Results / Process Orders []  - 0 Staff telephones HHA, Nursing Homes / Clarify orders / etc []  - 0 Routine Transfer to another Facility (non-emergent condition) []  - 0 Routine Hospital Admission (non-emergent condition) []  - 0 New Admissions / Biomedical engineer / Ordering NPWT, Apligraf, etc. []  - 0 Emergency Hospital Admission (emergent condition) X- 1 10 Simple Discharge Coordination Pearman, Bence C. ([id number]) []  - 0 Complex (extensive) Discharge Coordination PROCESS - Special Needs []  - Pediatric / Minor Patient Management 0 []  - 0 Isolation Patient Management []  - 0 Hearing / Language / Visual special needs []  - 0 Assessment of Community assistance (transportation, D/C planning, etc.) []  - 0 Additional  assistance / Altered mentation []  - 0 Support Surface(s) Assessment (bed, cushion, seat, etc.) INTERVENTIONS - Wound Cleansing / Measurement X - Simple Wound Cleansing - one wound 1 5 []  - 0 Complex  Wound Cleansing - multiple wounds X- 1 5 Wound Imaging (photographs - any number of wounds) []  - 0 Wound Tracing (instead of photographs) X- 1 5 Simple Wound Measurement - one wound []  - 0 Complex Wound Measurement - multiple wounds INTERVENTIONS - Wound Dressings X - Small Wound Dressing one or multiple wounds 1 10 []  - 0 Medium Wound Dressing one or multiple wounds []  - 0 Large Wound Dressing one or multiple wounds []  - 0 Application of Medications - topical []  - 0 Application of Medications - injection INTERVENTIONS - Miscellaneous []  - External ear exam 0 []  - 0 Specimen Collection (cultures, biopsies, blood, body fluids, etc.) []  - 0 Specimen(s) / Culture(s) sent or taken to Lab for analysis []  - 0 Patient Transfer (multiple staff / Civil Service fast streamer / Similar devices) []  - 0 Simple Staple / Suture removal (25 or less) []  - 0 Complex Staple / Suture removal (26 or more) []  - 0 Hypo / Hyperglycemic Management (close monitor of Blood Glucose) []  - 0 Ankle / Brachial Index (ABI) - do not check if billed separately X- 1 5 Vital Signs Unangst, Jaymz C. ([id number]) Has the patient been seen at the hospital within the last three years: Yes Total Score: 80 Level Of Care: New/Established - Level 3 Electronic Signature(s) Signed: 09/11/2017 4:45:51 PM By: Montey Hora Entered By: Montey Hora on 09/11/2017 15:34:48 Mendonsa, Ciaran C. ([id number]) -------------------------------------------------------------------------------- Encounter Discharge Information Details Patient Name: Judy, Zimir C. Date of Service: 09/11/2017 3:00 PM Medical Record Number: [id number] Patient Account Number: [id number] Date of Birth/Sex: [birth year] (57 y.o. M) Treating RN: Montey Hora Primary Care Maribel Hadley:  [name] Other Clinician: Referring Alvilda Mckenna: [name] Treating Raiford Fetterman/Extender: [name], [name] Weeks in Treatment: 12 Encounter Discharge Information Items Discharge Pain Level: 0 Discharge Condition: Stable Ambulatory Status: Ambulatory Discharge Destination: Home Transportation: Private Auto Accompanied By: self Schedule Follow-up Appointment: Yes Medication Reconciliation completed and No provided to Patient/Care Najae Rathert: Patient Clinical Summary of Care: Declined Electronic Signature(s) Signed: 09/11/2017 4:32:37 PM By: Ruthine Dose Entered By: Ruthine Dose on 09/11/2017 15:41:55 Legendre, [name] C. ([id number]) -------------------------------------------------------------------------------- Lower Extremity Assessment Details Patient Name: Lax, Shloima C. Date of Service: 09/11/2017 3:00 PM Medical Record Number: [id number] Patient Account Number: [id number] Date of Birth/Sex: 07-06-1960 (57 y.o. M) Treating RN: Roger Shelter Primary Care Nyimah Shadduck: [name] Other Clinician: Referring Bobbijo Holst: [name] Treating Embrie Mikkelsen/Extender: [name], [name] Weeks in Treatment: 12 Edema Assessment Assessed: [Left: No] [Right: No] [Left: Edema] [Right: :] Calf Left: Right: Point of Measurement: 37 cm From Medial Instep 41.6 cm cm Ankle Left: Right: Point of Measurement: 12 cm From Medial Instep 27.5 cm cm Vascular Assessment Claudication: Claudication Assessment [Left:None] Pulses: Dorsalis Pedis Palpable: [Left:Yes] Posterior Tibial Extremity colors, hair growth, and conditions: Extremity Color: [Left:Normal] Hair Growth on Extremity: [Left:Yes] Temperature of Extremity: [Left:Warm] Capillary Refill: [Left:< 3 seconds] Toe Nail Assessment Left: Right: Thick: No Discolored: No Deformed: No Improper Length and Hygiene: No Electronic Signature(s) Signed: 09/11/2017 4:04:12 PM By: Roger Shelter Entered By: Roger Shelter on 09/11/2017 15:13:11 Veracruz, Nicholas C. ([id number]) -------------------------------------------------------------------------------- Multi Wound Chart Details Patient Name: Sultan, Jermiah C. Date of Service: 09/11/2017 3:00 PM Medical Record Number: [id number] Patient Account Number: [id number] Date of Birth/Sex: 28-Aug-1960 (57 y.o. M) Treating RN: Montey Hora Primary Care Marvelous Bouwens: [name] Other Clinician: Referring Odilia Damico: [name] Treating Mortimer Bair/Extender: [name], [name] Weeks in Treatment: 12 Vital Signs Height(in): 75 Pulse(bpm): 91 Weight(lbs): 179 Blood Pressure(mmHg): 137/88 Body Mass Index(BMI): 22 Temperature(F): 98.1 Respiratory Rate  16 (breaths/min): Photos: [1:No Photos] [2:No Photos] [N/A:N/A] Wound Location: [1:Left, Proximal, Medial Lower Leg] [2:Left Lower Leg - Medial, Distal N/A] Wounding Event: [1:Gradually Appeared] [2:Gradually Appeared] [N/A:N/A] Primary Etiology: [1:Lymphedema] [2:Lymphedema] [N/A:N/A] Comorbid History: [1:N/A] [2:Sleep Apnea, Confinement Anxiety] [N/A:N/A] Date Acquired: [1:01/17/2017] [2:01/17/2017] [N/A:N/A] Weeks of Treatment: [1:12] [2:12] [N/A:N/A] Wound Status: [1:Healed - Epithelialized] [2:Open] [N/A:N/A] Measurements L x W x D [1:0x0x0] [2:0.6x0.6x0.1] [N/A:N/A] (cm) Area (cm) : [1:0] [2:0.283] [N/A:N/A] Volume (cm) : [1:0] [2:0.028] [N/A:N/A] % Reduction in Area: [1:100.00%] [2:94.50%] [N/A:N/A] % Reduction in Volume: [1:100.00%] [2:94.50%] [N/A:N/A] Classification: [1:Full Thickness Without Exposed Support Structures] [2:Full Thickness Without Exposed Support Structures] [N/A:N/A] Exudate Amount: [1:N/A] [2:Large] [N/A:N/A] Exudate Type: [1:N/A] [2:Serosanguineous] [N/A:N/A] Exudate Color: [1:N/A] [2:red, brown] [N/A:N/A] Wound Margin: [1:N/A] [2:Distinct, outline attached] [N/A:N/A] Granulation Amount: [1:N/A] [2:Large (67-100%)] [N/A:N/A] Granulation Quality: [1:N/A] [2:Red,  Hyper-granulation] [N/A:N/A] Necrotic Amount: [1:N/A] [2:Small (1-33%)] [N/A:N/A] Epithelialization: [1:N/A] [2:Small (1-33%)] [N/A:N/A] Periwound Skin Texture: [1:No Abnormalities Noted] [2:Excoriation: No Induration: No Callus: No Crepitus: No Rash: No Scarring: No] [N/A:N/A] Periwound Skin Moisture: [1:No Abnormalities Noted] [2:Maceration: No Dry/Scaly: No] [N/A:N/A] Periwound Skin Color: [1:No Abnormalities Noted] [2:Atrophie Blanche: No Cyanosis: No Ecchymosis: No] [N/A:N/A] Erythema: No Hemosiderin Staining: No Mottled: No Pallor: No Rubor: No Temperature: N/A No Abnormality N/A Tenderness on Palpation: No Yes N/A Wound Preparation: N/A Ulcer Cleansing: N/A Rinsed/Irrigated with Saline Topical Anesthetic Applied: Other: lidocaine 4% Treatment Notes Electronic Signature(s) Signed: 09/11/2017 4:45:51 PM By: Montey Hora Entered By: Montey Hora on 09/11/2017 15:33:36 Solorzano, Arsenio Loletha Grayer ([id number]) -------------------------------------------------------------------------------- [place] Details Patient Name: Mitchum, Mehar C. Date of Service: 09/11/2017 3:00 PM Medical Record Number: [id number] Patient Account Number: [id number] Date of Birth/Sex: June 18, 1960 (57 y.o. M) Treating RN: Montey Hora Primary Care Aengus Sauceda: [name] Other Clinician: Referring Barrett Goldie: [name] Treating Avanish Cerullo/Extender: [name], [name] Weeks in Treatment: 12 Active Inactive ` Orientation to the Wound Care Program Nursing Diagnoses: Knowledge deficit related to the wound healing center program Goals: Patient/caregiver will verbalize understanding of the [facility] Date Initiated: 06/19/2017 Target Resolution Date: 07/10/2017 Goal Status: Active Interventions: Provide education on orientation to the wound center Notes: ` Wound/Skin Impairment Nursing Diagnoses: Impaired tissue integrity Goals: Patient/caregiver will verbalize  understanding of skin care regimen Date Initiated: 06/19/2017 Target Resolution Date: 07/10/2017 Goal Status: Active Ulcer/skin breakdown will have a volume reduction of 30% by week 4 Date Initiated: 06/19/2017 Target Resolution Date: 07/10/2017 Goal Status: Active Interventions: Assess patient/caregiver ability to obtain necessary supplies Assess patient/caregiver ability to perform ulcer/skin care regimen upon admission and as needed Assess ulceration(s) every visit Treatment Activities: Skin care regimen initiated : 06/19/2017 Notes: Electronic Signature(s) Signed: 09/11/2017 4:45:51 PM By: Georgena Spurling, Mechele Claude ([id number]) Entered By: Montey Hora on 09/11/2017 15:33:29 Haupert, Aerik C. ([id number]) -------------------------------------------------------------------------------- Pain Assessment Details Patient Name: Mccubbin, Jhalen C. Date of Service: 09/11/2017 3:00 PM Medical Record Number: [id number] Patient Account Number: [id number] Date of Birth/Sex: 1960-10-22 (57 y.o. M) Treating RN: Roger Shelter Primary Care Chardonnay Holzmann: [name] Other Clinician: Referring Jamison Yuhasz: [name] Treating Jye Fariss/Extender: [name], [name] Weeks in Treatment: 12 Active Problems Location of Pain Severity and Description of Pain Patient Has Paino No Site Locations Pain Management and Medication Current Pain Management: Electronic Signature(s) Signed: 09/11/2017 4:04:12 PM By: Roger Shelter Entered By: Roger Shelter on 09/11/2017 15:08:09 Wierzbicki, Sasuke Loletha Grayer ([id number]) -------------------------------------------------------------------------------- Patient/Caregiver Education Details Patient Name: Metoyer, Burak C. Date of Service: 09/11/2017 3:00 PM Medical Record Number: [id number] Patient Account Number: [id number] Date of Birth/Gender: April 10, 1961 (57 y.o.  M) Treating RN: Montey Hora Primary Care Physician: [name] Other Clinician: Referring Physician:  [name] Treating Physician/Extender: Sharalyn Ink in Treatment: 12 Education Assessment Education Provided To: Patient Education Topics Provided Wound/Skin Impairment: Handouts: Caring for Your Ulcer, Other: change dressing as ordered Methods: Demonstration, Explain/Verbal Responses: State content correctly Electronic Signature(s) Signed: 09/11/2017 4:45:51 PM By: Montey Hora Entered By: Montey Hora on 09/11/2017 15:38:35 Retz, Xxavier C. ([id number]) -------------------------------------------------------------------------------- Wound Assessment Details Patient Name: Macneill, Zyiere C. Date of Service: 09/11/2017 3:00 PM Medical Record Number: [id number] Patient Account Number: [id number] Date of Birth/Sex: [birth year] (57 y.o. M) Treating RN: Roger Shelter Primary Care Hoang Reich: [name] Other Clinician: Referring Maja Mccaffery: [name] Treating Antigone Crowell/Extender: [name], [name] Weeks in Treatment: 12 Wound Status Wound Number: 1 Primary Etiology: Lymphedema Wound Location: Left, Proximal, Medial Lower Leg Wound Status: Healed - Epithelialized Wounding Event: Gradually Appeared Date Acquired: 01/17/2017 Weeks Of Treatment: 12 Clustered Wound: No Photos Photo Uploaded By: Roger Shelter on 09/11/2017 16:07:21 Wound Measurements Length: (cm) 0 Width: (cm) 0 Depth: (cm) 0 Area: (cm) 0 Volume: (cm) 0 % Reduction in Area: 100% % Reduction in Volume: 100% Wound Description Full Thickness Without Exposed Support Classification: Structures Periwound Skin Texture Texture Color No Abnormalities Noted: No No Abnormalities Noted: No Moisture No Abnormalities Noted: No Electronic Signature(s) Signed: 09/11/2017 4:04:12 PM By: Roger Shelter Entered By: Roger Shelter on 09/11/2017 15:11:39 Clugston, Olaf C. ([id number]) -------------------------------------------------------------------------------- Wound Assessment  Details Patient Name: Labombard, Makael C. Date of Service: 09/11/2017 3:00 PM Medical Record Number: [id number] Patient Account Number: [id number] Date of Birth/Sex: 03-Oct-1960 (57 y.o. M) Treating RN: Roger Shelter Primary Care Levaughn Puccinelli: [name] Other Clinician: Referring Ronel Rodeheaver: [name] Treating Suri Tafolla/Extender: [name], [name] Weeks in Treatment: 12 Wound Status Wound Number: 2 Primary Etiology: Lymphedema Wound Location: Left Lower Leg - Medial, Distal Wound Status: Open Wounding Event: Gradually Appeared Comorbid History: Sleep Apnea, Confinement Anxiety Date Acquired: 01/17/2017 Weeks Of Treatment: 12 Clustered Wound: No Photos Photo Uploaded By: Roger Shelter on 09/11/2017 16:07:21 Wound Measurements Length: (cm) 0.6 Width: (cm) 0.6 Depth: (cm) 0.1 Area: (cm) 0.283 Volume: (cm) 0.028 % Reduction in Area: 94.5% % Reduction in Volume: 94.5% Epithelialization: Small (1-33%) Tunneling: No Undermining: No Wound Description Full Thickness Without Exposed Support Classification: Structures Wound Margin: Distinct, outline attached Exudate Large Amount: Exudate Type: Serosanguineous Exudate Color: red, brown Foul Odor After Cleansing: No Slough/Fibrino Yes Wound Bed Granulation Amount: Large (67-100%) Exposed Structure Granulation Quality: Red, Hyper-granulation Fascia Exposed: No Necrotic Amount: Small (1-33%) Fat Layer (Subcutaneous Tissue) Exposed: Yes Necrotic Quality: Adherent Slough Tendon Exposed: No Muscle Exposed: No Joint Exposed: No Bone Exposed: No Knoke, Saahil C. ([id number]) Periwound Skin Texture Texture Color No Abnormalities Noted: No No Abnormalities Noted: No Callus: No Atrophie Blanche: No Crepitus: No Cyanosis: No Excoriation: No Ecchymosis: No Induration: No Erythema: No Rash: No Hemosiderin Staining: No Scarring: No Mottled: No Pallor: No Moisture Rubor: No No Abnormalities Noted: No Dry /  Scaly: No Temperature / Pain Maceration: No Temperature: No Abnormality Tenderness on Palpation: Yes Wound Preparation Ulcer Cleansing: Rinsed/Irrigated with Saline Topical Anesthetic Applied: Other: lidocaine 4%, Treatment Notes Wound #2 (Left, Distal, Medial Lower Leg) 1. Cleansed with: Clean wound with Normal Saline 2. Anesthetic Topical Lidocaine 4% cream to wound bed prior to debridement 4. Dressing Applied: Prisma Ag 5. Secondary Dressing Applied Bordered Foam Dressing Electronic Signature(s) Signed: 09/11/2017 4:04:12 PM By: Roger Shelter Entered By: Roger Shelter on 09/11/2017 15:13:37 Kates, Jilberto C. ([id number]) -------------------------------------------------------------------------------- Vitals Details  Patient Name: Presswood, Dona C. Date of Service: 09/11/2017 3:00 PM Medical Record Number: [id number] Patient Account Number: [id number] Date of Birth/Sex: 1961/02/02 (57 y.o. M) Treating RN: Roger Shelter Primary Care Ashleyann Shoun: [name] Other Clinician: Referring Jarel Cuadra: [name] Treating Jaleel Allen/Extender: [name], [name] Weeks in Treatment: 12 Vital Signs Time Taken: 15:08 Temperature (F): 98.1 Height (in): 75 Pulse (bpm): 91 Weight (lbs): 179 Respiratory Rate (breaths/min): 16 Body Mass Index (BMI): 22.4 Blood Pressure (mmHg): 137/88 Reference Range: 80 - 120 mg / dl Electronic Signature(s) Signed: 09/11/2017 4:04:12 PM By: Roger Shelter Entered By: Roger Shelter on 09/11/2017 15:10:08

## 2017-09-18 ENCOUNTER — Encounter: Payer: 59 | Admitting: Physician Assistant

## 2017-09-18 DIAGNOSIS — L97822 Non-pressure chronic ulcer of other part of left lower leg with fat layer exposed: Secondary | ICD-10-CM | POA: Diagnosis not present

## 2017-09-18 NOTE — Progress Notes (Signed)
[name], [name] ([id number]) Visit Report for 09/11/2017 Chief Complaint Document Details Patient Name: [name], [name] Date of Service: 09/11/2017 3:00 PM Medical Record Number: [id number] Patient Account Number: [id number] Date of Birth/Sex: 21-Oct-1960 (57 y.o. M) Treating RN: Montey Hora Primary Care Provider: [name] Other Clinician: Referring Provider: [name] Treating Provider/Extender: [name], [name] Weeks in Treatment: 12 Information Obtained from: Patient Chief Complaint He is here in follow up for lle venous ulcers Electronic Signature(s) Signed: 09/13/2017 12:59:06 AM By: Worthy Keeler PA-C Entered By: Worthy Keeler on 09/12/2017 23:43:32 Cerrito, Lani C. ([id number]) -------------------------------------------------------------------------------- HPI Details Patient Name: [name], [name] Date of Service: 09/11/2017 3:00 PM Medical Record Number: [id number] Patient Account Number: [id number] Date of Birth/Sex: 08-27-1960 (57 y.o. M) Treating RN: Montey Hora Primary Care Provider: [name] Other Clinician: Referring Provider: [name] Treating Provider/Extender: [name], [name] Weeks in Treatment: 12 History of Present Illness HPI Description: 06/25/17-he is here in follow-up evaluation for left lower extremity, medial malleolus, venous ulcers. He is compliant with compression stocking where although his compression stocking does not fit appropriately in length. He has been advised to contact elastic therapy, who measured and provided his current stockings, regarding a more appropriate fitted stocking. He is voicing no complaints or concerns, tolerated debridement. There is improvement in appearance, no significant change in measurements. We will continue with Santyl ointment, will apply to all 3 wounds. He will follow-up next week 06/30/17-he is here earlier than his scheduled appointment on Friday, per his request, secondary  to new open areas. After inquiry, it appears that the new superficial open areas are secondary to adhesive removal. There is no evidence of infection, minimal/scant drainage, most of the new areas are epithelialized. His chronic wounds are stable in appearance, will continue with Santyl. He wants to maintain Friday appointment and will follow-up next Friday, he was encouraged to contact clinic with any ulcer changes or concerns 07/24/17 On evaluation today patient appears to be doing well in regard to left lower extremity ulcer. He has been tolerating the dressing changes without complication. He still continues to use the [place] which does seem to be helpful for him this is excellent news. Definitely no evidence of infection today which is good news. Overall I'm pleased with how things are going. Patient likewise is also pleased. 07/31/17 on evaluation today patient's wound on the left medial lower extremity appears to be doing okay although he is having a little bit of maceration noted at this point. Obviously this may be due to the drainage coupled with the fact we have been using the Santyl and trying to keep this voice. Obviously it looks as if the wound is a little bit too macerated at this time. Fortunately there does not appear to be any evidence of infection which is good news. No fevers, chills, nausea, or vomiting noted at this time. 08/21/17 on evaluation today patient appears to be doing very well in regard to his left lower extremity ulcers. In fact one of the two these have completely healed at this point. The other is doing much better it is smaller and there is good epithelialization no significant adherent slough noted at this point. He has been tolerating the dressing changes without complication. There does not appear to be any evidence of infection. Unfortunately since I last saw him he has been in the hospital due to pneumonia. The good news is he seems to be improving following  his hospital stay he still seems a  little bit fatigued in my opinion he states that is indeed the case. Overall the wound does appear to be better. 09/04/17 on evaluation today patient appears to be doing very well in regard to his left lower extremity ulcer. He has been tolerating the dressing changes without complication he continues to make progress week by week which is good news. Overall he states the only complication he sees is that he wishes this will move along more quickly. 09/11/17 on evaluation today patient appears to be doing very well in regard to his lower extremity ulcer. He has been tolerating the dressing changes without complication specifically the wraps and seems to be doing very well. One of his two ulcers has actually healed on evaluation today and the other is significantly smaller. I do believe things are making excellent progress. Electronic Signature(s) Signed: 09/13/2017 12:59:06 AM By: Worthy Keeler PA-C Entered By: Worthy Keeler on 09/12/2017 23:43:47 [name], [name] ([id number]) -------------------------------------------------------------------------------- Physical Exam Details Patient Name: [name], [name] Date of Service: 09/11/2017 3:00 PM Medical Record Number: [id number] Patient Account Number: [id number] Date of Birth/Sex: 1960-07-21 (57 y.o. M) Treating RN: Montey Hora Primary Care Provider: [name] Other Clinician: Referring Provider: [name] Treating Provider/Extender: STONE III, [name] Weeks in Treatment: 90 Constitutional Well-nourished and well-hydrated in no acute distress. Respiratory normal breathing without difficulty. clear to auscultation bilaterally. Cardiovascular regular rate and rhythm with normal S1, S2. 1+ pitting edema of the bilateral lower extremities. Psychiatric this patient is able to make decisions and demonstrates good insight into disease process. Alert and Oriented x 3. pleasant and  cooperative. Notes Patient's wound bed shows an excellent granulation surface no sharp debridement required today I did mechanically debride the wound with saline and gauze without complication and post debridement the wound did appear to be much cleaner. Electronic Signature(s) Signed: 09/13/2017 12:59:06 AM By: Worthy Keeler PA-C Entered By: Worthy Keeler on 09/12/2017 23:44:38 [name], [name] Loletha Grayer ([id number]) -------------------------------------------------------------------------------- Physician Orders Details Patient Name: [name], [name] Date of Service: 09/11/2017 3:00 PM Medical Record Number: [id number] Patient Account Number: [id number] Date of Birth/Sex: 10/08/1960 (56 y.o. M) Treating RN: Montey Hora Primary Care Provider: [name] Other Clinician: Referring Provider: [name] Treating Provider/Extender: [name], [name] Weeks in Treatment: 12 Verbal / Phone Orders: No Diagnosis Coding Wound Cleansing Wound #2 Left,Distal,Medial Lower Leg o Clean wound with Normal Saline. Anesthetic (add to Medication List) Wound #2 Left,Distal,Medial Lower Leg o Topical Lidocaine 4% cream applied to wound bed prior to debridement (In Clinic Only). Primary Wound Dressing Wound #2 Left,Distal,Medial Lower Leg o Silver Collagen Secondary Dressing Wound #2 Left,Distal,Medial Lower Leg o Boardered Foam Dressing Dressing Change Frequency Wound #2 Left,Distal,Medial Lower Leg o Change dressing every other day. Follow-up Appointments Wound #2 Left,Distal,Medial Lower Leg o Return Appointment in 1 week. Edema Control Wound #2 Left,Distal,Medial Lower Leg o Patient to wear own compression stockings Electronic Signature(s) Signed: 09/11/2017 4:45:51 PM By: Montey Hora Signed: 09/13/2017 12:59:06 AM By: Worthy Keeler PA-C Entered By: Montey Hora on 09/11/2017 15:34:27 [name], [name]  ([id number]) -------------------------------------------------------------------------------- Problem List Details Patient Name: [name], [name] Date of Service: 09/11/2017 3:00 PM Medical Record Number: [id number] Patient Account Number: [id number] Date of Birth/Sex: February 12, 1961 (57 y.o. 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 ([id number]) -------------------------------------------------------------------------------- Progress Note Details Patient Name: [name], [name] Date of Service: 09/11/2017 3:00 PM Medical Record Number: [id number] Patient Account Number: [id number] Date of Birth/Sex: 1960/11/11 (57 y.o. M) Treating RN: Montey Hora Primary Care Provider: [name] Other Clinician: Referring Provider: [name] Treating Provider/Extender: [name], [name] Weeks in Treatment: 12 Subjective Chief Complaint Information obtained from Patient He is here in follow up for lle venous ulcers History of Present Illness (HPI) 06/25/17-he is here in follow-up evaluation for left lower extremity, medial malleolus, venous ulcers. He is compliant with compression stocking where although his compression stocking does not fit appropriately in length. He has been advised to contact elastic therapy, who measured and provided  his current stockings, regarding a more appropriate fitted stocking. He is voicing no complaints or concerns, tolerated debridement. There is improvement in appearance, no significant change in measurements. We will continue with Santyl ointment, will apply to all 3 wounds. He will follow-up next week 06/30/17-he is here earlier than his scheduled appointment on Friday, per his request, secondary to new open areas. After inquiry, it appears that the new superficial open areas are secondary to adhesive removal. There is no evidence of infection, minimal/scant drainage, most of the new areas are epithelialized. His chronic wounds are stable in appearance, will continue with Santyl. He wants to maintain Friday appointment and will follow-up next Friday, he was encouraged to contact clinic with any ulcer changes or concerns 07/24/17 On evaluation today patient appears to be doing well in regard to left lower extremity ulcer. He has been tolerating the dressing changes without complication. He still continues to use the [place] which does seem to be helpful for him this is excellent news. Definitely no evidence of infection today which is good news. Overall I'm pleased with how things are going. Patient likewise is also pleased. 07/31/17 on evaluation today patient's wound on the left medial lower extremity appears to be doing okay although he is having a little bit of maceration noted at this point. Obviously this may be due to the drainage coupled with the fact we have been using the Santyl and trying to keep this voice. Obviously it looks as if the wound is a little bit too macerated at this time. Fortunately there does not appear to be any evidence of infection which is good news. No fevers, chills, nausea, or vomiting noted at this time. 08/21/17 on evaluation today patient appears to be doing very well in regard to his left lower extremity ulcers. In fact one of the two these have completely healed at  this point. The other is doing much better it is smaller and there is good epithelialization no significant adherent slough noted at this point. He has been tolerating the dressing changes without complication. There does not appear to be any evidence of infection. Unfortunately since I last saw him he has been in the hospital due to pneumonia. The good news is he seems to be improving following his hospital stay he still seems a little bit fatigued in my opinion he states that is indeed the case. Overall the wound does appear to be better. 09/04/17 on evaluation today patient appears to be doing  very well in regard to his left lower extremity ulcer. He has been tolerating the dressing changes without complication he continues to make progress week by week which is good news. Overall he states the only complication he sees is that he wishes this will move along more quickly. 09/11/17 on evaluation today patient appears to be doing very well in regard to his lower extremity ulcer. He has been tolerating the dressing changes without complication specifically the wraps and seems to be doing very well. One of his two ulcers has actually healed on evaluation today and the other is significantly smaller. I do believe things are making excellent progress. Patient History Information obtained from Patient. [name], [name] ([id number]) Family History Cancer - Father, Heart Disease - Father,Siblings, No family history of Diabetes, Hereditary Spherocytosis, Hypertension, Kidney Disease, Lung Disease, Seizures, Stroke, Thyroid Problems, Tuberculosis. Social History Current some day smoker, Marital Status - Separated, Alcohol Use - Rarely, Drug Use - No History, Caffeine Use - Daily. Review of Systems (ROS) Constitutional Symptoms (General Health) Denies complaints or symptoms of Fever, Chills. Respiratory The patient has no complaints or symptoms. Cardiovascular Complains or has symptoms of LE  edema. Psychiatric The patient has no complaints or symptoms. Objective Constitutional Well-nourished and well-hydrated in no acute distress. Vitals Time Taken: 3:08 PM, Height: 75 in, Weight: 179 lbs, BMI: 22.4, Temperature: 98.1 F, Pulse: 91 bpm, Respiratory Rate: 16 breaths/min, Blood Pressure: 137/88 mmHg. Respiratory normal breathing without difficulty. clear to auscultation bilaterally. Cardiovascular regular rate and rhythm with normal S1, S2. 1+ pitting edema of the bilateral lower extremities. Psychiatric this patient is able to make decisions and demonstrates good insight into disease process. Alert and Oriented x 3. pleasant and cooperative. General Notes: Patient's wound bed shows an excellent granulation surface no sharp debridement required today I did mechanically debride the wound with saline and gauze without complication and post debridement the wound did appear to be much cleaner. Integumentary (Hair, Skin) Wound #1 status is Healed - Epithelialized. Original cause of wound was Gradually Appeared. The wound is located on the Left,Proximal,Medial Lower Leg. The wound measures 0cm length x 0cm width x 0cm depth; 0cm^2 area and 0cm^3 volume. Wound #2 status is Open. Original cause of wound was Gradually Appeared. The wound is located on the Left,Distal,Medial Lower Leg. The wound measures 0.6cm length x 0.6cm width x 0.1cm depth; 0.283cm^2 area and 0.028cm^3 volume. There is Fat Layer (Subcutaneous Tissue) Exposed exposed. There is no tunneling or undermining noted. There is a large amount of serosanguineous drainage noted. The wound margin is distinct with the outline attached to the wound base. There is large [name], [name] ([id number]) (67-100%) red, hyper - granulation within the wound bed. There is a small (1-33%) amount of necrotic tissue within the wound bed including Adherent Slough. The periwound skin appearance did not exhibit: Callus, Crepitus, Excoriation,  Induration, Rash, Scarring, Dry/Scaly, Maceration, Atrophie Blanche, Cyanosis, Ecchymosis, Hemosiderin Staining, Mottled, Pallor, Rubor, Erythema. Periwound temperature was noted as No Abnormality. The periwound has tenderness on palpation. 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 Anesthetic (add to Medication List): Wound #2 Left,Distal,Medial Lower Leg: Topical Lidocaine 4% cream applied to wound bed prior to debridement (In Clinic Only). Primary Wound Dressing: Wound #2 Left,Distal,Medial Lower Leg: Silver  Collagen Secondary Dressing: Wound #2 Left,Distal,Medial Lower Leg: Boardered Foam Dressing Dressing Change Frequency: Wound #2 Left,Distal,Medial Lower Leg: Change dressing every other day. Follow-up Appointments: Wound #2 Left,Distal,Medial Lower Leg: Return Appointment in 1 week. Edema Control: Wound #2 Left,Distal,Medial Lower Leg: Patient to wear own compression stockings I am going to suggest that we continue with the Current wound care measures for the next week. Patient is in agreement with plan. Hopefully he will continue to show signs of good improvement week by week as he has up to this point. Please see above for specific wound care orders. We will see patient for re-evaluation in 1 week(s) here in the clinic. If anything worsens or changes patient will contact our office for additional recommendations. Electronic Signature(s) [name], TOWERY ([id number]) Signed: 09/13/2017 12:59:06 AM By: Worthy Keeler PA-C Entered By: Worthy Keeler on 09/12/2017 23:45:04 [place], [place] Loletha Grayer ([id number]) -------------------------------------------------------------------------------- ROS/PFSH Details Patient Name:  Birchall, Dmonte C. Date of Service: 09/11/2017 3:00 PM Medical Record Number: [id number] Patient Account Number: [id number] Date of Birth/Sex: 1960-06-08 (57 y.o. M) Treating RN: Montey Hora Primary Care Provider: [name] Other Clinician: Referring Provider: [name] Treating Provider/Extender: [name], [name] Weeks in Treatment: 12 Information Obtained From Patient Wound History Do you currently have one or more open woundso Yes How many open wounds do you currently haveo 3 Approximately how long have you had your woundso several months How have you been treating your wound(s) until nowo xeroform gauze Has your wound(s) ever healed and then re-openedo No Have you had any lab work done in the past montho No Have you tested positive for an antibiotic resistant organism (MRSA, VRE)o No Have you tested positive for osteomyelitis (bone infection)o No Have you had any tests for circulation on your legso No Constitutional Symptoms (General Health) Complaints and Symptoms: Negative for: Fever; Chills Cardiovascular Complaints and Symptoms: Positive for: LE edema Medical History: Negative for: Arrhythmia; Congestive Heart Failure; Coronary Artery Disease; Deep Vein Thrombosis; Hypertension; Hypotension; Myocardial Infarction; Peripheral Arterial Disease; Peripheral Venous Disease; Phlebitis; Vasculitis Eyes Medical History: Negative for: Cataracts; Glaucoma; Optic Neuritis Hematologic/Lymphatic Medical History: Negative for: Anemia; Hemophilia; Human Immunodeficiency Virus; Lymphedema; Sickle Cell Disease Respiratory Complaints and Symptoms: No Complaints or Symptoms Medical History: Positive for: Sleep Apnea Negative for: Aspiration; Asthma; Chronic Obstructive Pulmonary Disease (COPD); Pneumothorax; Tuberculosis Gastrointestinal Mccutchen, Masoud C. ([id number]) Medical History: Negative for: Cirrhosis ; Colitis; Crohnos; Hepatitis A; Hepatitis B; Hepatitis  C Endocrine Medical History: Negative for: Type I Diabetes; Type II Diabetes Genitourinary Medical History: Negative for: End Stage Renal Disease Immunological Medical History: Negative for: Lupus Erythematosus; Raynaudos; Scleroderma Integumentary (Skin) Medical History: Negative for: History of Burn Musculoskeletal Medical History: Negative for: Gout; Rheumatoid Arthritis; Osteoarthritis; Osteomyelitis Neurologic Medical History: Negative for: Dementia; Neuropathy; Paraplegia; Seizure Disorder Psychiatric Complaints and Symptoms: No Complaints or Symptoms Medical History: Positive for: Confinement Anxiety Negative for: Anorexia/bulimia Immunizations Pneumococcal Vaccine: Received Pneumococcal Vaccination: No Implantable Devices Family and Social History Cancer: Yes - Father; Diabetes: No; Heart Disease: Yes - Father,Siblings; Hereditary Spherocytosis: No; Hypertension: No; Kidney Disease: No; Lung Disease: No; Seizures: No; Stroke: No; Thyroid Problems: No; Tuberculosis: No; Current some day smoker; Marital Status - Separated; Alcohol Use: Rarely; Drug Use: No History; Caffeine Use: Daily; Financial Concerns: No; Food, Clothing or Shelter Needs: No; Support System Lacking: No; Transportation Concerns: No; Advanced Directives: No; Patient does not want information on Advanced Directives; Do not resuscitate: No; Living Will: No; Medical Power of Attorney: No Physician Affirmation I have  reviewed and agree with the above information. DARREK, LEASURE ([id number]) Electronic Signature(s) Signed: 09/13/2017 12:59:06 AM By: Worthy Keeler PA-C Signed: 09/14/2017 4:05:33 PM By: Montey Hora Entered By: Worthy Keeler on 09/12/2017 23:44:09 Route, Tyr C. ([id number]) -------------------------------------------------------------------------------- SuperBill Details Patient Name: Cross, Camarion C. Date of Service: 09/11/2017 Medical Record Number: [id number] Patient Account Number:  [id number] Date of Birth/Sex: 07-01-1960 (57 y.o. M) Treating RN: Montey Hora Primary Care Provider: [name] Other Clinician: Referring Provider: [name] Treating Provider/Extender: [name], [name] Weeks in Treatment: 12 Diagnosis Coding ICD-10 Codes Code Description I89.0 Lymphedema, not elsewhere classified L97.822 Non-pressure chronic ulcer of other part of left lower leg with fat layer exposed E66.09 Other obesity due to excess calories Facility Procedures CPT4 Code: [id number] Description: 99213 - WOUND CARE VISIT-LEV 3 EST PT Modifier: Quantity: 1 Physician Procedures CPT4 Code Description: [id number] 99213 - WC PHYS LEVEL 3 - EST PT ICD-10 Diagnosis Description I89.0 Lymphedema, not elsewhere classified L97.822 Non-pressure chronic ulcer of other part of left lower leg wit E66.09 Other obesity due to excess calories Modifier: h fat layer expos Quantity: 1 ed Electronic Signature(s) Signed: 09/13/2017 12:59:06 AM By: Worthy Keeler PA-C Entered By: Worthy Keeler on 09/12/2017 23:45:33

## 2017-09-25 ENCOUNTER — Encounter: Payer: 59 | Admitting: Physician Assistant

## 2017-09-25 DIAGNOSIS — L97822 Non-pressure chronic ulcer of other part of left lower leg with fat layer exposed: Secondary | ICD-10-CM | POA: Diagnosis not present

## 2017-09-26 NOTE — Progress Notes (Signed)
EVER, [name] ([id number]) Visit Report for 09/18/2017 Chief Complaint Document Details Patient Name: [name], [name] Date of Service: 09/18/2017 3:00 PM Medical Record Number: [id number] Patient Account Number: [id number] Date of Birth/Sex: 1960-10-09 (57 y.o. M) Treating RN: Montey Hora Primary Care Provider: [name] Other Clinician: Referring Provider: [name] Treating Provider/Extender: [name], [name] Weeks in Treatment: 13 Information Obtained from: Patient Chief Complaint He is here in follow up for lle venous ulcers Electronic Signature(s) Signed: 09/18/2017 5:53:05 PM By: Worthy Keeler PA-C Entered By: Worthy Keeler on 09/18/2017 17:08:10 Lesage, Shahin C. ([id number]) -------------------------------------------------------------------------------- HPI Details Patient Name: [name], [name] Date of Service: 09/18/2017 3:00 PM Medical Record Number: [id number] Patient Account Number: [id number] Date of Birth/Sex: [birth year] (58 y.o. M) Treating RN: Montey Hora Primary Care Provider: [name] Other Clinician: Referring Provider: [name] Treating Provider/Extender: [name], [name] Weeks in Treatment: 13 History of Present Illness HPI Description: 06/25/17-he is here in follow-up evaluation for left lower extremity, medial malleolus, venous ulcers. He is compliant with compression stocking where although his compression stocking does not fit appropriately in length. He has been advised to contact elastic therapy, who measured and provided his current stockings, regarding a more appropriate fitted stocking. He is voicing no complaints or concerns, tolerated debridement. There is improvement in appearance, no significant change in measurements. We will continue with Santyl ointment, will apply to all 3 wounds. He will follow-up next week 06/30/17-he is here earlier than his scheduled appointment on Friday, per his request, secondary  to new open areas. After inquiry, it appears that the new superficial open areas are secondary to adhesive removal. There is no evidence of infection, minimal/scant drainage, most of the new areas are epithelialized. His chronic wounds are stable in appearance, will continue with Santyl. He wants to maintain Friday appointment and will follow-up next Friday, he was encouraged to contact clinic with any ulcer changes or concerns 07/24/17 On evaluation today patient appears to be doing well in regard to left lower extremity ulcer. He has been tolerating the dressing changes without complication. He still continues to use the [place] which does seem to be helpful for him this is excellent news. Definitely no evidence of infection today which is good news. Overall I'm pleased with how things are going. Patient likewise is also pleased. 07/31/17 on evaluation today patient's wound on the left medial lower extremity appears to be doing okay although he is having a little bit of maceration noted at this point. Obviously this may be due to the drainage coupled with the fact we have been using the Santyl and trying to keep this voice. Obviously it looks as if the wound is a little bit too macerated at this time. Fortunately there does not appear to be any evidence of infection which is good news. No fevers, chills, nausea, or vomiting noted at this time. 08/21/17 on evaluation today patient appears to be doing very well in regard to his left lower extremity ulcers. In fact one of the two these have completely healed at this point. The other is doing much better it is smaller and there is good epithelialization no significant adherent slough noted at this point. He has been tolerating the dressing changes without complication. There does not appear to be any evidence of infection. Unfortunately since I last saw him he has been in the hospital due to pneumonia. The good news is he seems to be improving following  his hospital stay he still seems a  little bit fatigued in my opinion he states that is indeed the case. Overall the wound does appear to be better. 09/04/17 on evaluation today patient appears to be doing very well in regard to his left lower extremity ulcer. He has been tolerating the dressing changes without complication he continues to make progress week by week which is good news. Overall he states the only complication he sees is that he wishes this will move along more quickly. 09/11/17 on evaluation today patient appears to be doing very well in regard to his lower extremity ulcer. He has been tolerating the dressing changes without complication specifically the wraps and seems to be doing very well. One of his two ulcers has actually healed on evaluation today and the other is significantly smaller. I do believe things are making excellent progress. 09/18/17 on evaluation today patient appears to be doing excellent in regard to his lower extremity ulcer on the left lower extremity. The previously healed site is still healed and appears to be doing well. The last remaining opening is much smaller and show signs of improving. Electronic Signature(s) Signed: 09/18/2017 5:53:05 PM By: Worthy Keeler PA-C Entered By: Worthy Keeler on 09/18/2017 17:08:33 Mauriello, Olanrewaju C. ([id number]) Swindell, Mechele Claude ([id number]) -------------------------------------------------------------------------------- Physical Exam Details Patient Name: [name], [name] Date of Service: 09/18/2017 3:00 PM Medical Record Number: [id number] Patient Account Number: [id number] Date of Birth/Sex: [birth year] (56 y.o. M) Treating RN: Montey Hora Primary Care Provider: [name] Other Clinician: Referring Provider: [name] Treating Provider/Extender: STONE III, [name] Weeks in Treatment: 52 Constitutional Well-nourished and well-hydrated in no acute distress. Respiratory normal breathing without difficulty.  clear to auscultation bilaterally. Cardiovascular regular rate and rhythm with normal S1, S2. trace pitting edema of the bilateral lower extremities. Psychiatric this patient is able to make decisions and demonstrates good insight into disease process. Alert and Oriented x 3. pleasant and cooperative. Notes Currently everything seems to be progressing very nicely in regard to the wound no sharp debridement was required today I did mechanically debride using saline and gauze which he tolerated without any significant pain or complication. Overall I think we are headed in the right direction. Electronic Signature(s) Signed: 09/18/2017 5:53:05 PM By: Worthy Keeler PA-C Entered By: Worthy Keeler on 09/18/2017 17:09:26 Bruski, Isam Loletha Grayer ([id number]) -------------------------------------------------------------------------------- Physician Orders Details Patient Name: Ganesh, Trelon C. Date of Service: 09/18/2017 3:00 PM Medical Record Number: [id number] Patient Account Number: [id number] Date of Birth/Sex: Apr 12, 1961 (57 y.o. M) Treating RN: [name] Primary Care Provider: [name] Other Clinician: Referring Provider: [name] Treating Provider/Extender: [name], [name] Weeks in Treatment: 13 Verbal / Phone Orders: No Diagnosis Coding Wound Cleansing Wound #2 Left,Distal,Medial Lower Leg o Clean wound with Normal Saline. Anesthetic (add to Medication List) Wound #2 Left,Distal,Medial Lower Leg o Topical Lidocaine 4% cream applied to wound bed prior to debridement (In Clinic Only). Primary Wound Dressing Wound #2 Left,Distal,Medial Lower Leg o Silver Collagen Secondary Dressing Wound #2 Left,Distal,Medial Lower Leg o Boardered Foam Dressing Dressing Change Frequency Wound #2 Left,Distal,Medial Lower Leg o Change dressing every other day. Follow-up Appointments Wound #2 Left,Distal,Medial Lower Leg o Return Appointment in 1 week. Edema  Control Wound #2 Left,Distal,Medial Lower Leg o Patient to wear own compression stockings Electronic Signature(s) Signed: 09/18/2017 5:53:05 PM By: Worthy Keeler PA-C Signed: 09/23/2017 12:27:24 PM By: [name], [name], RN, CWS, [name], [name] Entered By: [name], [name], RN, CWS, [name] on 09/18/2017 15:37:57 Mackowiak, Mechele Claude ([id number]) -------------------------------------------------------------------------------- Problem List  Details Patient Name: Leinbach, Damarien C. Date of Service: 09/18/2017 3:00 PM Medical Record Number: [id number] Patient Account Number: [id number] Date of Birth/Sex: 02-23-1961 (57 y.o. 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 ([id number]) -------------------------------------------------------------------------------- Progress Note Details Patient Name: Mrozek, Aydeen C. Date of Service: 09/18/2017 3:00 PM Medical Record Number: [id number] Patient Account Number: [id number] Date of Birth/Sex: Sep 12, 1960 (57 y.o. M) Treating RN: Montey Hora Primary Care Provider: [name] Other Clinician: Referring Provider: [name] Treating Provider/Extender: [name], [name] Weeks in Treatment: 13 Subjective Chief Complaint Information obtained from Patient He is here in follow up for lle venous ulcers History of  Present Illness (HPI) 06/25/17-he is here in follow-up evaluation for left lower extremity, medial malleolus, venous ulcers. He is compliant with compression stocking where although his compression stocking does not fit appropriately in length. He has been advised to contact elastic therapy, who measured and provided his current stockings, regarding a more appropriate fitted stocking. He is voicing no complaints or concerns, tolerated debridement. There is improvement in appearance, no significant change in measurements. We will continue with Santyl ointment, will apply to all 3 wounds. He will follow-up next week 06/30/17-he is here earlier than his scheduled appointment on Friday, per his request, secondary to new open areas. After inquiry, it appears that the new superficial open areas are secondary to adhesive removal. There is no evidence of infection, minimal/scant drainage, most of the new areas are epithelialized. His chronic wounds are stable in appearance, will continue with Santyl. He wants to maintain Friday appointment and will follow-up next Friday, he was encouraged to contact clinic with any ulcer changes or concerns 07/24/17 On evaluation today patient appears to be doing well in regard to left lower extremity ulcer. He has been tolerating the dressing changes without complication. He still continues to use the [place] which does seem to be helpful for him this is excellent news. Definitely no evidence of infection today which is good news. Overall I'm pleased with how things are going. Patient likewise is also pleased. 07/31/17 on evaluation today patient's wound on the left medial lower extremity appears to be doing okay although he is having a little bit of maceration noted at this point. Obviously this may be due to the drainage coupled with the fact we have been using the Santyl and trying to keep this voice. Obviously it looks as if the wound is a little bit too macerated at this  time. Fortunately there does not appear to be any evidence of infection which is good news. No fevers, chills, nausea, or vomiting noted at this time. 08/21/17 on evaluation today patient appears to be doing very well in regard to his left lower extremity ulcers. In fact one of the two these have completely healed at this point. The other is doing much better it is smaller and there is good epithelialization no significant adherent slough noted at this point. He has been tolerating the dressing changes without complication. There does not  appear to be any evidence of infection. Unfortunately since I last saw him he has been in the hospital due to pneumonia. The good news is he seems to be improving following his hospital stay he still seems a little bit fatigued in my opinion he states that is indeed the case. Overall the wound does appear to be better. 09/04/17 on evaluation today patient appears to be doing very well in regard to his left lower extremity ulcer. He has been tolerating the dressing changes without complication he continues to make progress week by week which is good news. Overall he states the only complication he sees is that he wishes this will move along more quickly. 09/11/17 on evaluation today patient appears to be doing very well in regard to his lower extremity ulcer. He has been tolerating the dressing changes without complication specifically the wraps and seems to be doing very well. One of his two ulcers has actually healed on evaluation today and the other is significantly smaller. I do believe things are making excellent progress. 09/18/17 on evaluation today patient appears to be doing excellent in regard to his lower extremity ulcer on the left lower extremity. The previously healed site is still healed and appears to be doing well. The last remaining opening is much smaller and show signs of improving. VOLNEY, REIERSON ([id number]) Patient History Information obtained  from Patient. Family History Cancer - Father, Heart Disease - Father,Siblings, No family history of Diabetes, Hereditary Spherocytosis, Hypertension, Kidney Disease, Lung Disease, Seizures, Stroke, Thyroid Problems, Tuberculosis. Social History Current some day smoker, Marital Status - Separated, Alcohol Use - Rarely, Drug Use - No History, Caffeine Use - Daily. Review of Systems (ROS) Constitutional Symptoms (General Health) Denies complaints or symptoms of Fever, Chills. Respiratory The patient has no complaints or symptoms. Cardiovascular Complains or has symptoms of LE edema. Psychiatric The patient has no complaints or symptoms. Objective Constitutional Well-nourished and well-hydrated in no acute distress. Vitals Time Taken: 3:24 PM, Height: 75 in, Weight: 179 lbs, BMI: 22.4, Temperature: 98.2 F, Pulse: 81 bpm, Respiratory Rate: 16 breaths/min, Blood Pressure: 138/82 mmHg. Respiratory normal breathing without difficulty. clear to auscultation bilaterally. Cardiovascular regular rate and rhythm with normal S1, S2. trace pitting edema of the bilateral lower extremities. Psychiatric this patient is able to make decisions and demonstrates good insight into disease process. Alert and Oriented x 3. pleasant and cooperative. General Notes: Currently everything seems to be progressing very nicely in regard to the wound no sharp debridement was required today I did mechanically debride using saline and gauze which he tolerated without any significant pain or complication. Overall I think we are headed in the right direction. Integumentary (Hair, Skin) Wound #2 status is Open. Original cause of wound was Gradually Appeared. The wound is located on the Left,Distal,Medial Lower Leg. The wound measures 0.5cm length x 0.5cm width x 0.1cm depth; 0.196cm^2 area and 0.02cm^3 volume. There is Fat Layer (Subcutaneous Tissue) Exposed exposed. There is no tunneling or undermining noted. There is  a medium Tumbleson, Maxxon C. ([id number]) amount of serosanguineous drainage noted. The wound margin is distinct with the outline attached to the wound base. There is large (67-100%) red, hyper - granulation within the wound bed. There is a small (1-33%) amount of necrotic tissue within the wound bed including Adherent Slough. The periwound skin appearance did not exhibit: Callus, Crepitus, Excoriation, Induration, Rash, Scarring, Dry/Scaly, Maceration, Atrophie Blanche, Cyanosis, Ecchymosis, Hemosiderin Staining, Mottled, Pallor, Rubor, Erythema. Periwound temperature was noted as No  Abnormality. The periwound has tenderness on palpation. 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 Anesthetic (add to Medication List): Wound #2 Left,Distal,Medial Lower Leg: Topical Lidocaine 4% cream applied to wound bed prior to debridement (In Clinic Only). Primary Wound Dressing: Wound #2 Left,Distal,Medial Lower Leg: Silver Collagen Secondary Dressing: Wound #2 Left,Distal,Medial Lower Leg: Boardered Foam Dressing Dressing Change Frequency: Wound #2 Left,Distal,Medial Lower Leg: Change dressing every other day. Follow-up Appointments: Wound #2 Left,Distal,Medial Lower Leg: Return Appointment in 1 week. Edema Control: Wound #2 Left,Distal,Medial Lower Leg: Patient to wear own compression stockings I'm gonna suggest that we continue with the Current wound care measures. Patient is in agreement the plan. We will see were things stand in one weeks time. Please see above for specific wound care orders. We will see patient for re-evaluation in 1 week(s) here in the clinic. If anything worsens or changes patient will contact our office for  additional recommendations. Electronic Signature(s) DAQUANN, MERRIOTT ([id number]) Signed: 09/18/2017 5:53:05 PM By: Worthy Keeler PA-C Entered By: Worthy Keeler on 09/18/2017 17:09:36 Stracke, Kevon Loletha Grayer ([id number]) -------------------------------------------------------------------------------- ROS/PFSH Details Patient Name: Barasch, Kalev C. Date of Service: 09/18/2017 3:00 PM Medical Record Number: [id number] Patient Account Number: [id number] Date of Birth/Sex: March 26, 1961 (57 y.o. M) Treating RN: Montey Hora Primary Care Provider: [name] Other Clinician: Referring Provider: [name] Treating Provider/Extender: [name], [name] Weeks in Treatment: 13 Information Obtained From Patient Wound History Do you currently have one or more open woundso Yes How many open wounds do you currently haveo 3 Approximately how long have you had your woundso several months How have you been treating your wound(s) until nowo xeroform gauze Has your wound(s) ever healed and then re-openedo No Have you had any lab work done in the past montho No Have you tested positive for an antibiotic resistant organism (MRSA, VRE)o No Have you tested positive for osteomyelitis (bone infection)o No Have you had any tests for circulation on your legso No Constitutional Symptoms (General Health) Complaints and Symptoms: Negative for: Fever; Chills Cardiovascular Complaints and Symptoms: Positive for: LE edema Medical History: Negative for: Arrhythmia; Congestive Heart Failure; Coronary Artery Disease; Deep Vein Thrombosis; Hypertension; Hypotension; Myocardial Infarction; Peripheral Arterial Disease; Peripheral Venous Disease; Phlebitis; Vasculitis Eyes Medical History: Negative for: Cataracts; Glaucoma; Optic Neuritis Hematologic/Lymphatic Medical History: Negative for: Anemia; Hemophilia; Human Immunodeficiency Virus; Lymphedema; Sickle Cell Disease Respiratory Complaints and  Symptoms: No Complaints or Symptoms Medical History: Positive for: Sleep Apnea Negative for: Aspiration; Asthma; Chronic Obstructive Pulmonary Disease (COPD); Pneumothorax; Tuberculosis Gastrointestinal Deignan, Arnulfo C. ([id number]) Medical History: Negative for: Cirrhosis ; Colitis; Crohnos; Hepatitis A; Hepatitis B; Hepatitis C Endocrine Medical History: Negative for: Type I Diabetes; Type II Diabetes Genitourinary Medical History: Negative for: End Stage Renal Disease Immunological Medical History: Negative for: Lupus Erythematosus; Raynaudos; Scleroderma Integumentary (Skin) Medical History: Negative for: History of Burn Musculoskeletal Medical History: Negative for: Gout; Rheumatoid Arthritis; Osteoarthritis; Osteomyelitis Neurologic Medical History: Negative for: Dementia; Neuropathy; Paraplegia; Seizure Disorder Psychiatric Complaints and Symptoms: No Complaints or Symptoms Medical History: Positive for: Confinement Anxiety Negative for: Anorexia/bulimia Immunizations Pneumococcal Vaccine: Received Pneumococcal Vaccination: No Implantable Devices Family and Social History Cancer: Yes - Father; Diabetes: No; Heart Disease: Yes - Father,Siblings; Hereditary Spherocytosis: No; Hypertension: No; Kidney Disease: No; Lung Disease:  No; Seizures: No; Stroke: No; Thyroid Problems: No; Tuberculosis: No; Current some day smoker; Marital Status - Separated; Alcohol Use: Rarely; Drug Use: No History; Caffeine Use: Daily; Financial Concerns: No; Food, Clothing or Shelter Needs: No; Support System Lacking: No; Transportation Concerns: No; Advanced Directives: No; Patient does not want information on Advanced Directives; Do not resuscitate: No; Living Will: No; Medical Power of Attorney: No Physician Affirmation I have reviewed and agree with the above information. ABIR, CRAINE ([id number]) Electronic Signature(s) Signed: 09/18/2017 5:53:05 PM By: Worthy Keeler PA-C Signed:  09/22/2017 4:18:46 PM By: Montey Hora Entered By: Worthy Keeler on 09/18/2017 17:08:58 Axton, Pilot C. ([id number]) -------------------------------------------------------------------------------- SuperBill Details Patient Name: Heft, Lash C. Date of Service: 09/18/2017 Medical Record Number: [id number] Patient Account Number: [id number] Date of Birth/Sex: [birth year] (57 y.o. M) Treating RN: Montey Hora Primary Care Provider: [name] Other Clinician: Referring Provider: [name] Treating Provider/Extender: [name], [name] Weeks in Treatment: 13 Diagnosis Coding ICD-10 Codes Code Description I89.0 Lymphedema, not elsewhere classified L97.822 Non-pressure chronic ulcer of other part of left lower leg with fat layer exposed E66.09 Other obesity due to excess calories Facility Procedures CPT4 Code: [id number] Description: 16384 - WOUND CARE VISIT-LEV 2 EST PT Modifier: Quantity: 1 Physician Procedures CPT4 Code Description: [id number] 32122 - WC PHYS LEVEL 3 - EST PT ICD-10 Diagnosis Description I89.0 Lymphedema, not elsewhere classified L97.822 Non-pressure chronic ulcer of other part of left lower leg wit E66.09 Other obesity due to excess calories Modifier: h fat layer expos Quantity: 1 ed Electronic Signature(s) Signed: 09/18/2017 5:53:05 PM By: Worthy Keeler PA-C Entered By: Worthy Keeler on 09/18/2017 17:09:52

## 2017-09-26 NOTE — Progress Notes (Signed)
[name], [name] ([id number]) Visit Report for 09/18/2017 Arrival Information Details Patient Name: [name], [name] Date of Service: 09/18/2017 3:00 PM Medical Record Number: [id number] Patient Account Number: [id number] Date of Birth/Sex: [birth year] (57 y.o. M) Treating RN: [name] Primary Care [name]: [name] Other Clinician: Referring [name]: [name] Treating [name]/Extender: [name], [name]: 13 Visit Information History Since Last Visit All ordered tests and consults were completed: No Patient Arrived: Ambulatory Added or deleted any medications: No Arrival Time: 15:22 Any new allergies or adverse reactions: No Accompanied By: self Had a fall or experienced change in No Transfer Assistance: None activities of daily living that may affect Patient Identification Verified: Yes risk of falls: Secondary Verification Process Completed: Yes Signs or symptoms of abuse/neglect since last visito No Patient Requires Transmission-Based No Hospitalized since last visit: No Precautions: Implantable device outside of the clinic excluding No Patient Has Alerts: No cellular tissue based products placed in the center since last visit: Pain Present Now: No Electronic Signature(s) Signed: 09/21/2017 4:40:07 PM By: [name] Entered By: [name] on 09/18/2017 15:23:07 [name], [name] ([id number]) -------------------------------------------------------------------------------- Clinic Level of Care Assessment Details Patient Name: [name], [name] Date of Service: 09/18/2017 3:00 PM Medical Record Number: [id number] Patient Account Number: [id number] Date of Birth/Sex: 01-15-1961 (57 y.o. M) Treating RN: [name] Primary Care [name]: [name] Other Clinician: Referring [name]: [name] Treating [name]: [name], [name]: 13 Clinic Level of Care Assessment Items TOOL 4  Quantity Score []  - Use when only an EandM is performed on FOLLOW-UP visit 0 ASSESSMENTS - Nursing Assessment / Reassessment []  - Reassessment of Co-morbidities (includes updates in patient status) 0 X- 1 5 Reassessment of Adherence to [name] Plan ASSESSMENTS - Wound and Skin Assessment / Reassessment X - Simple Wound Assessment / Reassessment - one wound 1 5 []  - 0 Complex Wound Assessment / Reassessment - multiple wounds []  - 0 Dermatologic / Skin Assessment (not related to wound area) ASSESSMENTS - Focused Assessment []  - Circumferential Edema Measurements - multi extremities 0 []  - 0 Nutritional Assessment / Counseling / Intervention []  - 0 Lower Extremity Assessment (monofilament, tuning fork, pulses) []  - 0 Peripheral Arterial Disease Assessment (using hand held doppler) ASSESSMENTS - Ostomy and/or Continence Assessment and Care []  - Incontinence Assessment and Management 0 []  - 0 Ostomy Care Assessment and Management (repouching, etc.) PROCESS - Coordination of Care X - Simple Patient / Family Education for ongoing care 1 15 []  - 0 Complex (extensive) Patient / Family Education for ongoing care []  - 0 Staff obtains Programmer, systems, Records, Test Results / Process Orders []  - 0 Staff telephones HHA, Nursing Homes / Clarify orders / etc []  - 0 Routine Transfer to another Facility (non-emergent condition) []  - 0 Routine Hospital Admission (non-emergent condition) []  - 0 New Admissions / Biomedical engineer / Ordering NPWT, Apligraf, etc. []  - 0 Emergency Hospital Admission (emergent condition) X- 1 10 Simple Discharge Coordination [name], [name] ([id number]) []  - 0 Complex (extensive) Discharge Coordination PROCESS - Special Needs []  - Pediatric / Minor Patient Management 0 []  - 0 Isolation Patient Management []  - 0 Hearing / Language / Visual special needs []  - 0 Assessment of Community assistance (transportation, D/C planning, etc.) []  - 0 Additional  assistance / Altered mentation []  - 0 Support Surface(s) Assessment (bed, cushion, seat, etc.) INTERVENTIONS - Wound Cleansing / Measurement X - Simple Wound Cleansing - one wound 1 5 []  - 0 Complex Wound  Cleansing - multiple wounds X- 1 5 Wound Imaging (photographs - any number of wounds) []  - 0 Wound Tracing (instead of photographs) X- 1 5 Simple Wound Measurement - one wound []  - 0 Complex Wound Measurement - multiple wounds INTERVENTIONS - Wound Dressings []  - Small Wound Dressing one or multiple wounds 0 X- 1 15 Medium Wound Dressing one or multiple wounds []  - 0 Large Wound Dressing one or multiple wounds []  - 0 Application of Medications - topical []  - 0 Application of Medications - injection INTERVENTIONS - Miscellaneous []  - External ear exam 0 []  - 0 Specimen Collection (cultures, biopsies, blood, body fluids, etc.) []  - 0 Specimen(s) / Culture(s) sent or taken to Lab for analysis []  - 0 Patient Transfer (multiple staff / Civil Service fast streamer / Similar devices) []  - 0 Simple Staple / Suture removal (25 or less) []  - 0 Complex Staple / Suture removal (26 or more) []  - 0 Hypo / Hyperglycemic Management (close monitor of Blood Glucose) []  - 0 Ankle / Brachial Index (ABI) - do not check if billed separately X- 1 5 Vital Signs [name], [name] ([id number]) Has the patient been seen at the hospital within the last three years: Yes Total Score: 70 Level Of Care: New/Established - Level 2 Electronic Signature(s) Signed: 09/23/2017 12:27:24 PM By: [name], [name], RN, CWS, [name], [name] Entered By: [name], [name], RN, CWS, [name] on 09/18/2017 15:38:19 [name], [name] ([id number]) -------------------------------------------------------------------------------- Encounter Discharge Information Details Patient Name: [name], [name] Date of Service: 09/18/2017 3:00 PM Medical Record Number: [id number] Patient Account Number: [id number] Date of Birth/Sex: [birth year] (57 y.o. M) Treating RN: Montey Hora Primary Care [name]: [name] Other Clinician: Referring [name]: [name] Treating Shi [name]/Extender: [name], [name]: 13 Encounter Discharge Information Items Discharge Pain Level: 0 Discharge Condition: Stable Ambulatory Status: Ambulatory Discharge Destination: Home Transportation: Private Auto Accompanied By: self Schedule Follow-up Appointment: Yes Medication Reconciliation completed and No provided to Patient/Care Meggin Ola: Patient Clinical Summary of Care: Declined Electronic Signature(s) Signed: 09/18/2017 4:41:07 PM By: Alric Quan Entered By: Alric Quan on 09/18/2017 15:52:15 Favero, Tresean C. ([id number]) -------------------------------------------------------------------------------- Lower Extremity Assessment Details Patient Name: [name], [name] Date of Service: 09/18/2017 3:00 PM Medical Record Number: [id number] Patient Account Number: [id number] Date of Birth/Sex: [birth year] (57 y.o. M) Treating RN: [name] Primary Care Jodel Mayhall: [name] Other Clinician: Referring Korinna Tat: [name] Treating Jamani Eley/Extender: [name], [name]: 13 Edema Assessment Assessed: [Left: No] [Right: No] [Left: Edema] [Right: :] Calf Left: Right: Point of Measurement: 37 cm From Medial Instep 41 cm cm Ankle Left: Right: Point of Measurement: 12 cm From Medial Instep 27 cm cm Vascular Assessment Claudication: Claudication Assessment [Left:None] Pulses: Dorsalis Pedis Palpable: [Left:Yes] Posterior Tibial Extremity colors, hair growth, and conditions: Extremity Color: [Left:Normal] Hair Growth on Extremity: [Left:Yes] Temperature of Extremity: [Left:Warm] Capillary Refill: [Left:< 3 seconds] Toe Nail Assessment Left: Right: Thick: Yes Discolored: Yes Deformed: No Improper Length and Hygiene: Yes Electronic Signature(s) Signed: 09/21/2017 4:40:07 PM By:  [name] Entered By: [name] on 09/18/2017 15:29:05 [name], [name] ([id number]) -------------------------------------------------------------------------------- Multi Wound Chart Details Patient Name: [name], [name] Date of Service: 09/18/2017 3:00 PM Medical Record Number: [id number] Patient Account Number: [id number] Date of Birth/Sex: [birth year] (57 y.o. M) Treating RN: [name] Primary Care Erisa Mehlman: [name] Other Clinician: Referring Kadden Osterhout: [name] Treating Laural Eiland/Extender: [name], [name]: 13 Vital Signs Height(in): 75 Pulse(bpm): 81 Weight(lbs): 179 Blood Pressure(mmHg): 138/82 Body Mass  Index(BMI): 22 Temperature(F): 98.2 Respiratory Rate 16 (breaths/min): Photos: [2:No Photos] [N/A:N/A] Wound Location: [2:Left Lower Leg - Medial, Distal] [N/A:N/A] Wounding Event: [2:Gradually Appeared] [N/A:N/A] Primary Etiology: [2:Lymphedema] [N/A:N/A] Comorbid History: [2:Sleep Apnea, Confinement Anxiety] [N/A:N/A] Date Acquired: [2:01/17/2017] [N/A:N/A] Weeks of [name]: [2:13] [N/A:N/A] Wound Status: [2:Open] [N/A:N/A] Measurements L x W x D [2:0.5x0.5x0.1] [N/A:N/A] (cm) Area (cm) : [2:0.196] [N/A:N/A] Volume (cm) : [2:0.02] [N/A:N/A] % Reduction in Area: [2:96.20%] [N/A:N/A] % Reduction in Volume: [2:96.10%] [N/A:N/A] Classification: [2:Full Thickness Without Exposed Support Structures] [N/A:N/A] Exudate Amount: [2:Medium] [N/A:N/A] Exudate Type: [2:Serosanguineous] [N/A:N/A] Exudate Color: [2:red, brown] [N/A:N/A] Wound Margin: [2:Distinct, outline attached] [N/A:N/A] Granulation Amount: [2:Large (67-100%)] [N/A:N/A] Granulation Quality: [2:Red, Hyper-granulation] [N/A:N/A] Necrotic Amount: [2:Small (1-33%)] [N/A:N/A] Exposed Structures: [2:Fat Layer (Subcutaneous Tissue) Exposed: Yes Fascia: No Tendon: No Muscle: No Joint: No Bone: No] [N/A:N/A] Epithelialization: [2:Small (1-33%)]  [N/A:N/A] Periwound Skin Texture: [2:Excoriation: No Induration: No Callus: No Crepitus: No] [N/A:N/A] Rash: No Scarring: No Periwound Skin Moisture: Maceration: No N/A N/A Dry/Scaly: No Periwound Skin Color: Atrophie Blanche: No N/A N/A Cyanosis: No Ecchymosis: No Erythema: No Hemosiderin Staining: No Mottled: No Pallor: No Rubor: No Temperature: No Abnormality N/A N/A Tenderness on Palpation: Yes N/A N/A Wound Preparation: Ulcer Cleansing: N/A N/A Rinsed/Irrigated with Saline Topical Anesthetic Applied: Other: hurricaine spray [name] Notes Electronic Signature(s) Signed: 09/23/2017 12:27:24 PM By: [name], [name], RN, CWS, [name], [name] Entered By: [name], [name], RN, CWS, [name] on 09/18/2017 15:37:23 Batt, [name] ([id number]) -------------------------------------------------------------------------------- Multi-Disciplinary Care Plan Details Patient Name: [name], [name] Date of Service: 09/18/2017 3:00 PM Medical Record Number: [id number] Patient Account Number: [id number] Date of Birth/Sex: [birth year] (57 y.o. M) Treating RN: [name] Primary Care Yilin Weedon: [name] Other Clinician: Referring Wanita Derenzo: [name] Treating Donie Lemelin/Extender: [name], [name]: 13 Active Inactive ` Orientation to the Wound Care Program Nursing Diagnoses: Knowledge deficit related to the wound healing center program Goals: Patient/caregiver will verbalize understanding of the [place] Program Date Initiated: 06/19/2017 Target Resolution Date: 07/10/2017 Goal Status: Active Interventions: Provide education on orientation to the wound center Notes: ` Wound/Skin Impairment Nursing Diagnoses: Impaired tissue integrity Goals: Patient/caregiver will verbalize understanding of skin care regimen Date Initiated: 06/19/2017 Target Resolution Date: 07/10/2017 Goal Status: Active Ulcer/skin breakdown will have a volume reduction of 30% by week  4 Date Initiated: 06/19/2017 Target Resolution Date: 07/10/2017 Goal Status: Active Interventions: Assess patient/caregiver ability to obtain necessary supplies Assess patient/caregiver ability to perform ulcer/skin care regimen upon admission and as needed Assess ulceration(s) every visit [name] Activities: Skin care regimen initiated : 06/19/2017 Notes: Electronic Signature(s) Signed: 09/23/2017 12:27:24 PM By: [name], [name], RN, CWS, [name], [name] [name], [name] ([id number]) Entered By: [name], [name], RN, CWS, [name] on 09/18/2017 15:37:15 [name], [name] ([id number]) -------------------------------------------------------------------------------- Pain Assessment Details Patient Name: Febres, Newel C. Date of Service: 09/18/2017 3:00 PM Medical Record Number: [id number] Patient Account Number: [id number] Date of Birth/Sex: 1960/06/04 (57 y.o. M) Treating RN: [name] Primary Care Alejandro Adcox: [name] Other Clinician: Referring Trayvon Trumbull: [name] Treating Ebba Goll/Extender: [name], [name]: 13 Active Problems Location of Pain Severity and Description of Pain Patient Has Paino No Site Locations Pain Management and Medication Current Pain Management: Electronic Signature(s) Signed: 09/21/2017 4:40:07 PM By: [name] Entered By: [name] on 09/18/2017 15:23:13 Donate, Zyrion Loletha Grayer ([id number]) -------------------------------------------------------------------------------- Patient/Caregiver Education Details Patient Name: Klich, Perez C. Date of Service: 09/18/2017 3:00 PM Medical Record Number: [id number] Patient Account Number: [id number] Date of Birth/Gender: 28-Jun-1960 (57 y.o. M)  Treating RN: Ahmed Prima Primary Care Physician: [name] Other Clinician: Referring Physician: Myrtie [name] Treating Physician/Extender: Sharalyn Ink in [name]: 13 Education Assessment Education Provided  To: Patient Education Topics Provided Wound/Skin Impairment: Handouts: Caring for Your Ulcer, Other: change dressing as ordered Methods: Demonstration, Explain/Verbal Responses: State content correctly Electronic Signature(s) Signed: 09/18/2017 4:41:07 PM By: Alric Quan Entered By: Alric Quan on 09/18/2017 15:52:34 Milkovich, Ajai C. ([id number]) -------------------------------------------------------------------------------- Wound Assessment Details Patient Name: Nadeem, Carly C. Date of Service: 09/18/2017 3:00 PM Medical Record Number: [id number] Patient Account Number: [id number] Date of Birth/Sex: 11/02/1960 (57 y.o. M) Treating RN: [name] Primary Care Brittini Brubeck: [name] Other Clinician: Referring Eyoel Throgmorton: [name] Treating Aviana Shevlin/Extender: [name], [name]: 13 Wound Status Wound Number: 2 Primary Etiology: Lymphedema Wound Location: Left Lower Leg - Medial, Distal Wound Status: Open Wounding Event: Gradually Appeared Comorbid History: Sleep Apnea, Confinement Anxiety Date Acquired: 01/17/2017 Weeks Of [name]: 13 Clustered Wound: No Photos Photo Uploaded By: [name] on 09/18/2017 16:02:02 Wound Measurements Length: (cm) 0.5 Width: (cm) 0.5 Depth: (cm) 0.1 Area: (cm) 0.196 Volume: (cm) 0.02 % Reduction in Area: 96.2% % Reduction in Volume: 96.1% Epithelialization: Small (1-33%) Tunneling: No Undermining: No Wound Description Full Thickness Without Exposed Support Classification: Structures Wound Margin: Distinct, outline attached Exudate Medium Amount: Exudate Type: Serosanguineous Exudate Color: red, brown Foul Odor After Cleansing: No Slough/Fibrino Yes Wound Bed Granulation Amount: Large (67-100%) Exposed Structure Granulation Quality: Red, Hyper-granulation Fascia Exposed: No Necrotic Amount: Small (1-33%) Fat Layer (Subcutaneous Tissue) Exposed: Yes Necrotic Quality:  Adherent Slough Tendon Exposed: No Muscle Exposed: No Joint Exposed: No Bone Exposed: No Brandow, Nunzio C. ([id number]) Periwound Skin Texture Texture Color No Abnormalities Noted: No No Abnormalities Noted: No Callus: No Atrophie Blanche: No Crepitus: No Cyanosis: No Excoriation: No Ecchymosis: No Induration: No Erythema: No Rash: No Hemosiderin Staining: No Scarring: No Mottled: No Pallor: No Moisture Rubor: No No Abnormalities Noted: No Dry / Scaly: No Temperature / Pain Maceration: No Temperature: No Abnormality Tenderness on Palpation: Yes Wound Preparation Ulcer Cleansing: Rinsed/Irrigated with Saline Topical Anesthetic Applied: Other: hurricaine spray, Electronic Signature(s) Signed: 09/21/2017 4:40:07 PM By: [name] Entered By: [name] on 09/18/2017 15:27:36 Memmer, Foxx C. ([id number]) -------------------------------------------------------------------------------- Vitals Details Patient Name: Salazar, Gorden C. Date of Service: 09/18/2017 3:00 PM Medical Record Number: [id number] Patient Account Number: [id number] Date of Birth/Sex: [birth year] (57 y.o. M) Treating RN: [name] Primary Care Demonica Farrey: [name] Other Clinician: Referring Jelani Trueba: [name] Treating Julane Crock/Extender: [name], [name]: 13 Vital Signs Time Taken: 15:24 Temperature (F): 98.2 Height (in): 75 Pulse (bpm): 81 Weight (lbs): 179 Respiratory Rate (breaths/min): 16 Body Mass Index (BMI): 22.4 Blood Pressure (mmHg): 138/82 Reference Range: 80 - 120 mg / dl Electronic Signature(s) Signed: 09/21/2017 4:40:07 PM By: [name] Entered By: [name] on 09/18/2017 15:25:37

## 2017-09-28 LAB — ACID FAST SMEAR (AFB)
ACID FAST SMEAR - AFSCU2: NEGATIVE
ACID FAST SMEAR - AFSCU2: NEGATIVE

## 2017-09-28 LAB — ACID FAST CULTURE WITH REFLEXED SENSITIVITIES: ACID FAST CULTURE - AFSCU3: NEGATIVE

## 2017-09-28 LAB — ACID FAST CULTURE WITH REFLEXED SENSITIVITIES (MYCOBACTERIA)
Acid Fast Culture: NEGATIVE
Acid Fast Culture: NEGATIVE

## 2017-09-28 LAB — ACID FAST SMEAR (AFB, MYCOBACTERIA)

## 2017-09-29 ENCOUNTER — Encounter: Payer: Self-pay | Admitting: Internal Medicine

## 2017-09-29 ENCOUNTER — Other Ambulatory Visit: Payer: Self-pay | Admitting: Internal Medicine

## 2017-09-29 DIAGNOSIS — G8929 Other chronic pain: Secondary | ICD-10-CM

## 2017-09-29 DIAGNOSIS — M545 Low back pain: Principal | ICD-10-CM

## 2017-09-29 LAB — ACID FAST CULTURE WITH REFLEXED SENSITIVITIES

## 2017-09-29 LAB — ACID FAST CULTURE WITH REFLEXED SENSITIVITIES (MYCOBACTERIA): Acid Fast Culture: NEGATIVE

## 2017-09-29 MED ORDER — DICLOFENAC SODIUM 75 MG PO TBEC: 75.0000 mg | DELAYED_RELEASE_TABLET | Freq: Two times a day (BID) | ORAL | 1 refills | Status: DC | PRN

## 2017-09-29 NOTE — Progress Notes (Signed)
STEPEHN, ECKARD ([id number]) Visit Report for 09/25/2017 Chief Complaint Document Details Patient Name: [name], [name] Date of Service: 09/25/2017 3:00 PM Medical Record Number: [id number] Patient Account Number: [id number] Date of Birth/Sex: 25-Jan-1961 (57 y.o. M) Treating RN: Montey Hora Primary Care Provider: [name] Other Clinician: Referring Provider: [name] Treating Provider/Extender: [name], [name] Weeks in Treatment: 14 Information Obtained from: Patient Chief Complaint He is here in follow up for lle venous ulcers Electronic Signature(s) Signed: 09/25/2017 5:37:52 PM By: Worthy Keeler PA-C Entered By: Worthy Keeler on 09/25/2017 15:04:10 [name], [name] ([id number]) -------------------------------------------------------------------------------- HPI Details Patient Name: [name], [name] Date of Service: 09/25/2017 3:00 PM Medical Record Number: [id number] Patient Account Number: [id number] Date of Birth/Sex: 1960/06/27 (57 y.o. M) Treating RN: Montey Hora Primary Care Provider: [name] Other Clinician: Referring Provider: [name] Treating Provider/Extender: [name], [name] Weeks in Treatment: 14 History of Present Illness HPI Description: 06/25/17-he is here in follow-up evaluation for left lower extremity, medial malleolus, venous ulcers. He is compliant with compression stocking where although his compression stocking does not fit appropriately in length. He has been advised to contact elastic therapy, who measured and provided his current stockings, regarding a more appropriate fitted stocking. He is voicing no complaints or concerns, tolerated debridement. There is improvement in appearance, no significant change in measurements. We will continue with Santyl ointment, will apply to all 3 wounds. He will follow-up next week 06/30/17-he is here earlier than his scheduled appointment on Friday, per his request, secondary  to new open areas. After inquiry, it appears that the new superficial open areas are secondary to adhesive removal. There is no evidence of infection, minimal/scant drainage, most of the new areas are epithelialized. His chronic wounds are stable in appearance, will continue with Santyl. He wants to maintain Friday appointment and will follow-up next Friday, he was encouraged to contact clinic with any ulcer changes or concerns 07/24/17 On evaluation today patient appears to be doing well in regard to left lower extremity ulcer. He has been tolerating the dressing changes without complication. He still continues to use the Edwards which does seem to be helpful for him this is excellent news. Definitely no evidence of infection today which is good news. Overall I'm pleased with how things are going. Patient likewise is also pleased. 07/31/17 on evaluation today patient's wound on the left medial lower extremity appears to be doing okay although he is having a little bit of maceration noted at this point. Obviously this may be due to the drainage coupled with the fact we have been using the Santyl and trying to keep this voice. Obviously it looks as if the wound is a little bit too macerated at this time. Fortunately there does not appear to be any evidence of infection which is good news. No fevers, chills, nausea, or vomiting noted at this time. 08/21/17 on evaluation today patient appears to be doing very well in regard to his left lower extremity ulcers. In fact one of the two these have completely healed at this point. The other is doing much better it is smaller and there is good epithelialization no significant adherent slough noted at this point. He has been tolerating the dressing changes without complication. There does not appear to be any evidence of infection. Unfortunately since I last saw him he has been in the hospital due to pneumonia. The good news is he seems to be improving following  his hospital stay he still seems a  little bit fatigued in my opinion he states that is indeed the case. Overall the wound does appear to be better. 09/04/17 on evaluation today patient appears to be doing very well in regard to his left lower extremity ulcer. He has been tolerating the dressing changes without complication he continues to make progress week by week which is good news. Overall he states the only complication he sees is that he wishes this will move along more quickly. 09/11/17 on evaluation today patient appears to be doing very well in regard to his lower extremity ulcer. He has been tolerating the dressing changes without complication specifically the wraps and seems to be doing very well. One of his two ulcers has actually healed on evaluation today and the other is significantly smaller. I do believe things are making excellent progress. 09/18/17 on evaluation today patient appears to be doing excellent in regard to his lower extremity ulcer on the left lower extremity. The previously healed site is still healed and appears to be doing well. The last remaining opening is much smaller and show signs of improving. 09/25/17 on evaluation today patient appears to actually be doing very well in regard to his ulcer. He has been tolerating the dressing changes without complication. This all and all appears to be almost completely healed. He still has one open area remaining that we are still working on. Otherwise he is having no discomfort which is good news. [name] ([id number]) Electronic Signature(s) Signed: 09/25/2017 5:37:52 PM By: Worthy Keeler PA-C Entered By: Worthy Keeler on 09/25/2017 15:33:20 [name], [name] ([id number]) -------------------------------------------------------------------------------- Physical Exam Details Patient Name: [name], [name] Date of Service: 09/25/2017 3:00 PM Medical Record Number: [id number] Patient Account Number: [id number] Date of Birth/Sex:  1960-09-01 (57 y.o. M) Treating RN: Montey Hora Primary Care Provider: [name] Other Clinician: Referring Provider: [name] Treating Provider/Extender: STONE III, [name] Weeks in Treatment: 60 Constitutional Well-nourished and well-hydrated in no acute distress. Respiratory normal breathing without difficulty. clear to auscultation bilaterally. Cardiovascular regular rate and rhythm with normal S1, S2. no clubbing, cyanosis, significant edema, <3 sec cap refill. Psychiatric this patient is able to make decisions and demonstrates good insight into disease process. Alert and Oriented x 3. pleasant and cooperative. Notes Patient's wound shows an excellent granular surface and is very small at this point. There does not appear to be any evidence of infection. The good news is he is tolerating the dressing changes without complication and is showing signs of great improvement. Electronic Signature(s) Signed: 09/25/2017 5:37:52 PM By: Worthy Keeler PA-C Entered By: Worthy Keeler on 09/25/2017 15:34:17 [name], [name] ([id number]) -------------------------------------------------------------------------------- Physician Orders Details Patient Name: Oliver, Bransyn C. Date of Service: 09/25/2017 3:00 PM Medical Record Number: [id number] Patient Account Number: [id number] Date of Birth/Sex: 10/31/1960 (57 y.o. M) Treating RN: Montey Hora Primary Care Provider: [name] Other Clinician: Referring Provider: [name] Treating Provider/Extender: [name], [name] Weeks in Treatment: 14 Verbal / Phone Orders: No Diagnosis Coding ICD-10 Coding Code Description I89.0 Lymphedema, not elsewhere classified L97.822 Non-pressure chronic ulcer of other part of left lower leg with fat layer exposed E66.09 Other obesity due to excess calories Wound Cleansing Wound #2 Left,Distal,Medial Lower Leg o Clean wound with Normal Saline. Anesthetic (add  to Medication List) Wound #2 Left,Distal,Medial Lower Leg o Topical Lidocaine 4% cream applied to wound bed prior to debridement (In Clinic Only). Primary Wound Dressing Wound #2 Left,Distal,Medial Lower Leg o Silver Collagen Secondary Dressing Wound #2  Left,Distal,Medial Lower Leg o Boardered Foam Dressing Dressing Change Frequency Wound #2 Left,Distal,Medial Lower Leg o Change dressing every other day. Follow-up Appointments Wound #2 Left,Distal,Medial Lower Leg o Return Appointment in 2 weeks. Edema Control Wound #2 Left,Distal,Medial Lower Leg o Patient to wear own compression stockings Electronic Signature(s) Signed: 09/25/2017 4:37:07 PM By: Montey Hora Signed: 09/25/2017 5:37:52 PM By: Worthy Keeler PA-C Entered By: Montey Hora on 09/25/2017 15:06:25 [name], [name] ([id number]) [name], [name] ([id number]) -------------------------------------------------------------------------------- Problem List Details Patient Name: Hogate, Jarae C. Date of Service: 09/25/2017 3:00 PM Medical Record Number: [id number] Patient Account Number: [id number] Date of Birth/Sex: 14-Jun-1960 (57 y.o. M) Treating RN: Montey Hora Primary Care Provider: [name] Other Clinician: Referring Provider: [name] Treating Provider/Extender: [name], [name] Weeks in Treatment: 14 Active Problems ICD-10 Impacting Encounter Code Description Active Date Wound Healing Diagnosis I89.0 Lymphedema, not elsewhere classified 06/22/2017 Yes L97.822 Non-pressure chronic ulcer of other part of left lower leg with 06/22/2017 Yes fat layer exposed E66.09 Other obesity due to excess calories 06/22/2017 Yes Inactive Problems Resolved Problems Electronic Signature(s) Signed: 09/25/2017 5:37:52 PM By: Worthy Keeler PA-C Entered By: Worthy Keeler on 09/25/2017 15:04:04 [name], [name]  ([id number]) -------------------------------------------------------------------------------- Progress Note Details Patient Name: [name], [name] Date of Service: 09/25/2017 3:00 PM Medical Record Number: [id number] Patient Account Number: [id number] Date of Birth/Sex: 03/07/1961 (57 y.o. M) Treating RN: Montey Hora Primary Care Provider: [name] Other Clinician: Referring Provider: [name] Treating Provider/Extender: [name], [name] Weeks in Treatment: 14 Subjective Chief Complaint Information obtained from Patient He is here in follow up for lle venous ulcers History of Present Illness (HPI) 06/25/17-he is here in follow-up evaluation for left lower extremity, medial malleolus, venous ulcers. He is compliant with compression stocking where although his compression stocking does not fit appropriately in length. He has been advised to contact elastic therapy, who measured and provided his current stockings, regarding a more appropriate fitted stocking. He is voicing no complaints or concerns, tolerated debridement. There is improvement in appearance, no significant change in measurements. We will continue with Santyl ointment, will apply to all 3 wounds. He will follow-up next week 06/30/17-he is here earlier than his scheduled appointment on Friday, per his request, secondary to new open areas. After inquiry, it appears that the new superficial open areas are secondary to adhesive removal. There is no evidence of infection, minimal/scant drainage, most of the new areas are epithelialized. His chronic wounds are stable in appearance, will continue with Santyl. He wants to maintain Friday appointment and will follow-up next Friday, he was encouraged to contact clinic with any ulcer changes or concerns 07/24/17 On evaluation today patient appears to be doing well in regard to left lower extremity ulcer. He has been tolerating the dressing changes without complication.  He still continues to use the [place] which does seem to be helpful for him this is excellent news. Definitely no evidence of infection today which is good news. Overall I'm pleased with how things are going. Patient likewise is also pleased. 07/31/17 on evaluation today patient's wound on the left medial lower extremity appears to be doing okay although he is having a little bit of maceration noted at this point. Obviously this may be due to the drainage coupled with the fact we have been using the Santyl and trying to keep this voice. Obviously it looks as if the wound is a little bit too macerated at this time. Fortunately there does not appear to be  any evidence of infection which is good news. No fevers, chills, nausea, or vomiting noted at this time. 08/21/17 on evaluation today patient appears to be doing very well in regard to his left lower extremity ulcers. In fact one of the two these have completely healed at this point. The other is doing much better it is smaller and there is good epithelialization no significant adherent slough noted at this point. He has been tolerating the dressing changes without complication. There does not appear to be any evidence of infection. Unfortunately since I last saw him he has been in the hospital due to pneumonia. The good news is he seems to be improving following his hospital stay he still seems a little bit fatigued in my opinion he states that is indeed the case. Overall the wound does appear to be better. 09/04/17 on evaluation today patient appears to be doing very well in regard to his left lower extremity ulcer. He has been tolerating the dressing changes without complication he continues to make progress week by week which is good news. Overall he states the only complication he sees is that he wishes this will move along more quickly. 09/11/17 on evaluation today patient appears to be doing very well in regard to his lower extremity ulcer. He has  been tolerating the dressing changes without complication specifically the wraps and seems to be doing very well. One of his two ulcers has actually healed on evaluation today and the other is significantly smaller. I do believe things are making excellent progress. 09/18/17 on evaluation today patient appears to be doing excellent in regard to his lower extremity ulcer on the left lower extremity. The previously healed site is still healed and appears to be doing well. The last remaining opening is much smaller and show signs of improving. [name], [name] ([id number]) 09/25/17 on evaluation today patient appears to actually be doing very well in regard to his ulcer. He has been tolerating the dressing changes without complication. This all and all appears to be almost completely healed. He still has one open area remaining that we are still working on. Otherwise he is having no discomfort which is good news. Patient History Information obtained from Patient. Family History Cancer - Father, Heart Disease - Father,Siblings, No family history of Diabetes, Hereditary Spherocytosis, Hypertension, Kidney Disease, Lung Disease, Seizures, Stroke, Thyroid Problems, Tuberculosis. Social History Current some day smoker, Marital Status - Separated, Alcohol Use - Rarely, Drug Use - No History, Caffeine Use - Daily. Review of Systems (ROS) Constitutional Symptoms (General Health) Denies complaints or symptoms of Fever, Chills. Respiratory The patient has no complaints or symptoms. Cardiovascular Complains or has symptoms of LE edema. Psychiatric The patient has no complaints or symptoms. Objective Constitutional Well-nourished and well-hydrated in no acute distress. Vitals Time Taken: 2:48 PM, Height: 75 in, Weight: 179 lbs, BMI: 22.4, Temperature: 98.2 F, Pulse: 91 bpm, Blood Pressure: 139/87 mmHg. Respiratory normal breathing without difficulty. clear to auscultation  bilaterally. Cardiovascular regular rate and rhythm with normal S1, S2. no clubbing, cyanosis, significant edema, Psychiatric this patient is able to make decisions and demonstrates good insight into disease process. Alert and Oriented x 3. pleasant and cooperative. General Notes: Patient's wound shows an excellent granular surface and is very small at this point. There does not appear to be any evidence of infection. The good news is he is tolerating the dressing changes without complication and is showing signs of great improvement. Integumentary (Hair, Skin) [name], [name] ([id number])  Wound #2 status is Open. Original cause of wound was Gradually Appeared. The wound is located on the Left,Distal,Medial Lower Leg. The wound measures 0.3cm length x 0.2cm width x 0.1cm depth; 0.047cm^2 area and 0.005cm^3 volume. There is no tunneling or undermining noted. There is a small amount of serous drainage noted. The wound margin is distinct with the outline attached to the wound base. There is small (1-33%) pink granulation within the wound bed. There is a large (67- 100%) amount of necrotic tissue within the wound bed including Eschar. The periwound skin appearance exhibited: Scarring. The periwound skin appearance did not exhibit: Callus, Crepitus, Excoriation, Induration, Rash, Dry/Scaly, Maceration, Atrophie Blanche, Cyanosis, Ecchymosis, Hemosiderin Staining, Mottled, Pallor, Rubor, Erythema. Periwound temperature was noted as No Abnormality. 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 Anesthetic (add to Medication List): Wound #2 Left,Distal,Medial Lower Leg: Topical Lidocaine 4% cream applied to wound bed prior to debridement  (In Clinic Only). Primary Wound Dressing: Wound #2 Left,Distal,Medial Lower Leg: Silver Collagen Secondary Dressing: Wound #2 Left,Distal,Medial Lower Leg: Boardered Foam Dressing Dressing Change Frequency: Wound #2 Left,Distal,Medial Lower Leg: Change dressing every other day. Follow-up Appointments: Wound #2 Left,Distal,Medial Lower Leg: Return Appointment in 2 weeks. Edema Control: Wound #2 Left,Distal,Medial Lower Leg: Patient to wear own compression stockings I'm gonna recommend at this point in time that we continue with the Current wound care measures for the next week. Patient is in agreement with this plan. Fortunately he shows no evidence of infection which is good news. I think we can probably give him a two week follow-up visit at which point I expect and hope that this will be completely healed by that time. Patient is in agreement with plan. Otherwise and listen thing changes significantly I'm hopeful that this will be the last visit he has to come back and see [place] for. [name], [name] ([id number]) Please see above for specific wound care orders. We will see patient for re-evaluation in 1 week(s) here in the clinic. If anything worsens or changes patient will contact our office for additional recommendations. Electronic Signature(s) Signed: 09/25/2017 5:37:52 PM By: Worthy Keeler PA-C Entered By: Worthy Keeler on 09/25/2017 15:34:53 [name], Kimm CMarland Kitchen ([id number]) -------------------------------------------------------------------------------- ROS/PFSH Details Patient Name: [name], [name] Date of Service: 09/25/2017 3:00 PM Medical Record Number: [id number] Patient Account Number: [id number] Date of Birth/Sex: 11-24-1960 (57 y.o. M) Treating RN: Montey Hora Primary Care Provider: [name] Other Clinician: Referring Provider: [name] Treating Provider/Extender: [name], [name] Weeks in Treatment: 14 Information Obtained From Patient Wound  History Do you currently have one or more open woundso Yes How many open wounds do you currently haveo 3 Approximately how long have you had your woundso several months How have you been treating your wound(s) until nowo xeroform gauze Has your wound(s) ever healed and then re-openedo No Have you had any lab work done in the past montho No Have you tested positive for an antibiotic resistant organism (MRSA, VRE)o No Have you tested positive for osteomyelitis (bone infection)o No Have you had any tests for circulation on your legso No Constitutional Symptoms (General Health) Complaints and Symptoms: Negative for: Fever; Chills Cardiovascular Complaints and Symptoms: Positive for: LE edema Medical History: Negative for: Arrhythmia; Congestive Heart Failure; Coronary Artery Disease;  Deep Vein Thrombosis; Hypertension; Hypotension; Myocardial Infarction; Peripheral Arterial Disease; Peripheral Venous Disease; Phlebitis; Vasculitis Eyes Medical History: Negative for: Cataracts; Glaucoma; Optic Neuritis Hematologic/Lymphatic Medical History: Negative for: Anemia; Hemophilia; Human Immunodeficiency Virus; Lymphedema; Sickle Cell Disease Respiratory Complaints and Symptoms: No Complaints or Symptoms Medical History: Positive for: Sleep Apnea Negative for: Aspiration; Asthma; Chronic Obstructive Pulmonary Disease (COPD); Pneumothorax; Tuberculosis Gastrointestinal Svehla, Josephus C. ([id number]) Medical History: Negative for: Cirrhosis ; Colitis; Crohnos; Hepatitis A; Hepatitis B; Hepatitis C Endocrine Medical History: Negative for: Type I Diabetes; Type II Diabetes Genitourinary Medical History: Negative for: End Stage Renal Disease Immunological Medical History: Negative for: Lupus Erythematosus; Raynaudos; Scleroderma Integumentary (Skin) Medical History: Negative for: History of Burn Musculoskeletal Medical History: Negative for: Gout; Rheumatoid Arthritis; Osteoarthritis;  Osteomyelitis Neurologic Medical History: Negative for: Dementia; Neuropathy; Paraplegia; Seizure Disorder Psychiatric Complaints and Symptoms: No Complaints or Symptoms Medical History: Positive for: Confinement Anxiety Negative for: Anorexia/bulimia Immunizations Pneumococcal Vaccine: Received Pneumococcal Vaccination: No Implantable Devices Family and Social History Cancer: Yes - Father; Diabetes: No; Heart Disease: Yes - Father,Siblings; Hereditary Spherocytosis: No; Hypertension: No; Kidney Disease: No; Lung Disease: No; Seizures: No; Stroke: No; Thyroid Problems: No; Tuberculosis: No; Current some day smoker; Marital Status - Separated; Alcohol Use: Rarely; Drug Use: No History; Caffeine Use: Daily; Financial Concerns: No; Food, Clothing or Shelter Needs: No; Support System Lacking: No; Transportation Concerns: No; Advanced Directives: No; Patient does not want information on Advanced Directives; Do not resuscitate: No; Living Will: No; Medical Power of Attorney: No Physician Affirmation I have reviewed and agree with the above information. HASAAN, RADDE ([id number]) Electronic Signature(s) Signed: 09/25/2017 4:37:07 PM By: Montey Hora Signed: 09/25/2017 5:37:52 PM By: Worthy Keeler PA-C Entered By: Worthy Keeler on 09/25/2017 15:33:42 Bisaillon, Darrly C. ([id number]) -------------------------------------------------------------------------------- SuperBill Details Patient Name: Pineau, Jovante C. Date of Service: 09/25/2017 Medical Record Number: [id number] Patient Account Number: [id number] Date of Birth/Sex: 1961-03-10 (57 y.o. M) Treating RN: Montey Hora Primary Care Provider: [name] Other Clinician: Referring Provider: [name] Treating Provider/Extender: [name], [name] Weeks in Treatment: 14 Diagnosis Coding ICD-10 Codes Code Description I89.0 Lymphedema, not elsewhere classified L97.822 Non-pressure chronic ulcer of other part of left lower  leg with fat layer exposed E66.09 Other obesity due to excess calories Facility Procedures CPT4 Code: [id number] Description: 99213 - WOUND CARE VISIT-LEV 3 EST PT Modifier: Quantity: 1 Physician Procedures CPT4 Code Description: [id number] 99213 - WC PHYS LEVEL 3 - EST PT ICD-10 Diagnosis Description I89.0 Lymphedema, not elsewhere classified L97.822 Non-pressure chronic ulcer of other part of left lower leg wit E66.09 Other obesity due to excess calories Modifier: h fat layer expos Quantity: 1 ed Electronic Signature(s) Signed: 09/25/2017 5:37:52 PM By: Worthy Keeler PA-C Entered By: Worthy Keeler on 09/25/2017 15:35:17

## 2017-09-29 NOTE — Progress Notes (Signed)
DERWARD, MARPLE ([id number]) Visit Report for 09/25/2017 Arrival Information Details Patient Name: [name], [name] Date of Service: 09/25/2017 3:00 PM Medical Record Number: [id number] Patient Account Number: [id number] Date of Birth/Sex: 03/23/1961 (57 y.o. M) Treating RN: Roger Shelter Primary Care Juanetta Negash: [name] Other Clinician: Referring Lilyonna Steidle: [name] Treating Scott Fix/Extender: [name], [name] Weeks in Treatment: 14 Visit Information History Since Last Visit All ordered tests and consults were completed: No Patient Arrived: Ambulatory Added or deleted any medications: No Arrival Time: 14:46 Any new allergies or adverse reactions: No Accompanied By: self Had a fall or experienced change in No Transfer Assistance: None activities of daily living that may affect Patient Identification Verified: Yes risk of falls: Secondary Verification Process Completed: Yes Signs or symptoms of abuse/neglect since last visito No Patient Requires Transmission-Based No Hospitalized since last visit: No Precautions: Implantable device outside of the clinic excluding No Patient Has Alerts: No cellular tissue based products placed in the center since last visit: Pain Present Now: No Electronic Signature(s) Signed: 09/25/2017 4:07:12 PM By: Roger Shelter Entered By: Roger Shelter on 09/25/2017 14:46:30 Shutter, Fortino C. ([id number]) -------------------------------------------------------------------------------- Clinic Level of Care Assessment Details Patient Name: [name], [name] Date of Service: 09/25/2017 3:00 PM Medical Record Number: [id number] Patient Account Number: [id number] Date of Birth/Sex: [birth year] (57 y.o. M) Treating RN: Montey Hora Primary Care Matan Steen: [name] Other Clinician: Referring Demani Mcbrien: [name] Treating Gustave Lindeman/Extender: [name], [name] Weeks in Treatment: 14 Clinic Level of Care Assessment Items TOOL  4 Quantity Score []  - Use when only an EandM is performed on FOLLOW-UP visit 0 ASSESSMENTS - Nursing Assessment / Reassessment X - Reassessment of Co-morbidities (includes updates in patient status) 1 10 X- 1 5 Reassessment of Adherence to Treatment Plan ASSESSMENTS - Wound and Skin Assessment / Reassessment X - Simple Wound Assessment / Reassessment - one wound 1 5 []  - 0 Complex Wound Assessment / Reassessment - multiple wounds []  - 0 Dermatologic / Skin Assessment (not related to wound area) ASSESSMENTS - Focused Assessment X - Circumferential Edema Measurements - multi extremities 1 5 []  - 0 Nutritional Assessment / Counseling / Intervention X- 1 5 Lower Extremity Assessment (monofilament, tuning fork, pulses) []  - 0 Peripheral Arterial Disease Assessment (using hand held doppler) ASSESSMENTS - Ostomy and/or Continence Assessment and Care []  - Incontinence Assessment and Management 0 []  - 0 Ostomy Care Assessment and Management (repouching, etc.) PROCESS - Coordination of Care X - Simple Patient / Family Education for ongoing care 1 15 []  - 0 Complex (extensive) Patient / Family Education for ongoing care []  - 0 Staff obtains Programmer, systems, Records, Test Results / Process Orders []  - 0 Staff telephones HHA, Nursing Homes / Clarify orders / etc []  - 0 Routine Transfer to another Facility (non-emergent condition) []  - 0 Routine Hospital Admission (non-emergent condition) []  - 0 New Admissions / Biomedical engineer / Ordering NPWT, Apligraf, etc. []  - 0 Emergency Hospital Admission (emergent condition) X- 1 10 Simple Discharge Coordination Reither, [name] ([id number]) []  - 0 Complex (extensive) Discharge Coordination PROCESS - Special Needs []  - Pediatric / Minor Patient Management 0 []  - 0 Isolation Patient Management []  - 0 Hearing / Language / Visual special needs []  - 0 Assessment of Community assistance (transportation, D/C planning, etc.) []  - 0 Additional  assistance / Altered mentation []  - 0 Support Surface(s) Assessment (bed, cushion, seat, etc.) INTERVENTIONS - Wound Cleansing / Measurement X - Simple Wound Cleansing - one wound 1 5 []  - 0  Complex Wound Cleansing - multiple wounds X- 1 5 Wound Imaging (photographs - any number of wounds) []  - 0 Wound Tracing (instead of photographs) X- 1 5 Simple Wound Measurement - one wound []  - 0 Complex Wound Measurement - multiple wounds INTERVENTIONS - Wound Dressings X - Small Wound Dressing one or multiple wounds 1 10 []  - 0 Medium Wound Dressing one or multiple wounds []  - 0 Large Wound Dressing one or multiple wounds []  - 0 Application of Medications - topical []  - 0 Application of Medications - injection INTERVENTIONS - Miscellaneous []  - External ear exam 0 []  - 0 Specimen Collection (cultures, biopsies, blood, body fluids, etc.) []  - 0 Specimen(s) / Culture(s) sent or taken to Lab for analysis []  - 0 Patient Transfer (multiple staff / Civil Service fast streamer / Similar devices) []  - 0 Simple Staple / Suture removal (25 or less) []  - 0 Complex Staple / Suture removal (26 or more) []  - 0 Hypo / Hyperglycemic Management (close monitor of Blood Glucose) []  - 0 Ankle / Brachial Index (ABI) - do not check if billed separately X- 1 5 Vital Signs Oloughlin, Kelcey C. ([id number]) Has the patient been seen at the hospital within the last three years: Yes Total Score: 85 Level Of Care: New/Established - Level 3 Electronic Signature(s) Signed: 09/25/2017 4:37:07 PM By: Montey Hora Entered By: Montey Hora on 09/25/2017 15:06:47 Robeck, Librado C. ([id number]) -------------------------------------------------------------------------------- Lower Extremity Assessment Details Patient Name: Huseby, [name] Date of Service: 09/25/2017 3:00 PM Medical Record Number: [id number] Patient Account Number: [id number] Date of Birth/Sex: May 12, 1961 (57 y.o. M) Treating RN: Roger Shelter Primary Care Ling Flesch:  [name] Other Clinician: Referring Amandalee Lacap: [name] Treating Alaia Lordi/Extender: [name], [name] Weeks in Treatment: 14 Edema Assessment Assessed: [Left: No] [Right: No] Edema: [Left: Ye] [Right: s] Calf Left: Right: Point of Measurement: 37 cm From Medial Instep 42.8 cm cm Ankle Left: Right: Point of Measurement: 12 cm From Medial Instep 29 cm cm Vascular Assessment Pulses: Dorsalis Pedis Palpable: [Left:Yes] Posterior Tibial Extremity colors, hair growth, and conditions: Extremity Color: [Left:Normal] Hair Growth on Extremity: [Left:No] Temperature of Extremity: [Left:Warm] Capillary Refill: [Left:< 3 seconds] Toe Nail Assessment Left: Right: Thick: Yes Discolored: Yes Deformed: No Improper Length and Hygiene: Yes Electronic Signature(s) Signed: 09/25/2017 4:07:12 PM By: Roger Shelter Entered By: Roger Shelter on 09/25/2017 14:54:34 Ciaravino, Lionardo C. ([id number]) -------------------------------------------------------------------------------- Multi Wound Chart Details Patient Name: [name], [name] Date of Service: 09/25/2017 3:00 PM Medical Record Number: [id number] Patient Account Number: [id number] Date of Birth/Sex: June 11, 1960 (57 y.o. M) Treating RN: Montey Hora Primary Care Rease Swinson: [name] Other Clinician: Referring Myrene Bougher: [name] Treating Miche Loughridge/Extender: [name], [name] Weeks in Treatment: 14 Vital Signs Height(in): 75 Pulse(bpm): 91 Weight(lbs): 179 Blood Pressure(mmHg): 139/87 Body Mass Index(BMI): 22 Temperature(F): 98.2 Respiratory Rate (breaths/min): Photos: [2:No Photos] [N/A:N/A] Wound Location: [2:Left Lower Leg - Medial, Distal] [N/A:N/A] Wounding Event: [2:Gradually Appeared] [N/A:N/A] Primary Etiology: [2:Lymphedema] [N/A:N/A] Comorbid History: [2:Sleep Apnea, Confinement Anxiety] [N/A:N/A] Date Acquired: [2:01/17/2017] [N/A:N/A] Weeks of Treatment: [2:14] [N/A:N/A] Wound  Status: [2:Open] [N/A:N/A] Measurements L x W x D [2:0.3x0.2x0.1] [N/A:N/A] (cm) Area (cm) : [2:0.047] [N/A:N/A] Volume (cm) : [2:0.005] [N/A:N/A] % Reduction in Area: [2:99.10%] [N/A:N/A] % Reduction in Volume: [2:99.00%] [N/A:N/A] Classification: [2:Full Thickness Without Exposed Support Structures] [N/A:N/A] Exudate Amount: [2:Small] [N/A:N/A] Exudate Type: [2:Serous] [N/A:N/A] Exudate Color: [2:amber] [N/A:N/A] Wound Margin: [2:Distinct, outline attached] [N/A:N/A] Granulation Amount: [2:Small (1-33%)] [N/A:N/A] Granulation Quality: [2:Pink] [N/A:N/A] Necrotic Amount: [2:Large (67-100%)] [N/A:N/A] Necrotic Tissue: [2:Eschar] [N/A:N/A] Exposed  Structures: [2:Fascia: No Fat Layer (Subcutaneous Tissue) Exposed: No Tendon: No Muscle: No Joint: No Bone: No] [N/A:N/A] Epithelialization: [2:Medium (34-66%)] [N/A:N/A] Periwound Skin Texture: [2:Scarring: Yes Excoriation: No Induration: No Callus: No] [N/A:N/A] Crepitus: No Rash: No Periwound Skin Moisture: Maceration: No N/A N/A Dry/Scaly: No Periwound Skin Color: Atrophie Blanche: No N/A N/A Cyanosis: No Ecchymosis: No Erythema: No Hemosiderin Staining: No Mottled: No Pallor: No Rubor: No Temperature: No Abnormality N/A N/A Tenderness on Palpation: No N/A N/A Wound Preparation: Ulcer Cleansing: N/A N/A Rinsed/Irrigated with Saline Topical Anesthetic Applied: Other: lidocaine 4% Treatment Notes Electronic Signature(s) Signed: 09/25/2017 4:37:07 PM By: Montey Hora Entered By: Montey Hora on 09/25/2017 15:05:18 Leighty, Jessejames Loletha Grayer ([id number]) -------------------------------------------------------------------------------- [place] Details Patient Name: [name], [name] Date of Service: 09/25/2017 3:00 PM Medical Record Number: [id number] Patient Account Number: [id number] Date of Birth/Sex: 1961/02/06 (57 y.o. M) Treating RN: Montey Hora Primary Care Ranada Vigorito: [name] Other  Clinician: Referring Breindy Meadow: [name] Treating Miesha Bachmann/Extender: [name], [name] Weeks in Treatment: 14 Active Inactive ` Orientation to the Wound Care Program Nursing Diagnoses: Knowledge deficit related to the wound healing center program Goals: Patient/caregiver will verbalize understanding of the [facility] Program Date Initiated: 06/19/2017 Target Resolution Date: 07/10/2017 Goal Status: Active Interventions: Provide education on orientation to the wound center Notes: ` Wound/Skin Impairment Nursing Diagnoses: Impaired tissue integrity Goals: Patient/caregiver will verbalize understanding of skin care regimen Date Initiated: 06/19/2017 Target Resolution Date: 07/10/2017 Goal Status: Active Ulcer/skin breakdown will have a volume reduction of 30% by week 4 Date Initiated: 06/19/2017 Target Resolution Date: 07/10/2017 Goal Status: Active Interventions: Assess patient/caregiver ability to obtain necessary supplies Assess patient/caregiver ability to perform ulcer/skin care regimen upon admission and as needed Assess ulceration(s) every visit Treatment Activities: Skin care regimen initiated : 06/19/2017 Notes: Electronic Signature(s) Signed: 09/25/2017 4:37:07 PM By: Georgena Spurling, Mechele Claude ([id number]) Entered By: Montey Hora on 09/25/2017 15:05:10 Grubb, Yutaka C. ([id number]) -------------------------------------------------------------------------------- Pain Assessment Details Patient Name: Banfield, Daksh C. Date of Service: 09/25/2017 3:00 PM Medical Record Number: [id number] Patient Account Number: [id number] Date of Birth/Sex: 1961/01/21 (57 y.o. M) Treating RN: Roger Shelter Primary Care Sebasthian Stailey: [name] Other Clinician: Referring Dominic Rhome: [name] Treating Kassadie Pancake/Extender: [name], [name] Weeks in Treatment: 14 Active Problems Location of Pain Severity and Description of Pain Patient Has Paino No Site  Locations Pain Management and Medication Current Pain Management: Electronic Signature(s) Signed: 09/25/2017 4:07:12 PM By: Roger Shelter Entered By: Roger Shelter on 09/25/2017 14:46:37 Benway, Jackob C. ([id number]) -------------------------------------------------------------------------------- Wound Assessment Details Patient Name: [name], [name] Date of Service: 09/25/2017 3:00 PM Medical Record Number: [id number] Patient Account Number: [id number] Date of Birth/Sex: June 09, 1960 (57 y.o. M) Treating RN: Roger Shelter Primary Care Toinette Lackie: [name] Other Clinician: Referring Harvard Zeiss: [name] Treating Knight Oelkers/Extender: [name], [name] Weeks in Treatment: 14 Wound Status Wound Number: 2 Primary Etiology: Lymphedema Wound Location: Left Lower Leg - Medial, Distal Wound Status: Open Wounding Event: Gradually Appeared Comorbid History: Sleep Apnea, Confinement Anxiety Date Acquired: 01/17/2017 Weeks Of Treatment: 14 Clustered Wound: No Photos Photo Uploaded By: [name], [name], RN, CWS, [name] on 09/25/2017 15:53:02 Wound Measurements Length: (cm) 0.3 Width: (cm) 0.2 Depth: (cm) 0.1 Area: (cm) 0.047 Volume: (cm) 0.005 % Reduction in Area: 99.1% % Reduction in Volume: 99% Epithelialization: Medium (34-66%) Tunneling: No Undermining: No Wound Description Full Thickness Without Exposed Support Foul Odo Classification: Structures Slough/F Wound Margin: Distinct, outline attached Exudate Small Amount: Exudate Type: Serous Exudate Color: amber r After Cleansing: No ibrino  Yes Wound Bed Granulation Amount: Small (1-33%) Exposed Structure Granulation Quality: Pink Fascia Exposed: No Necrotic Amount: Large (67-100%) Fat Layer (Subcutaneous Tissue) Exposed: No Necrotic Quality: Eschar Tendon Exposed: No Muscle Exposed: No Joint Exposed: No Bone Exposed: No Cammon, Mamadou C. ([id number]) Periwound Skin Texture Texture Color No Abnormalities  Noted: No No Abnormalities Noted: No Callus: No Atrophie Blanche: No Crepitus: No Cyanosis: No Excoriation: No Ecchymosis: No Induration: No Erythema: No Rash: No Hemosiderin Staining: No Scarring: Yes Mottled: No Pallor: No Moisture Rubor: No No Abnormalities Noted: No Dry / Scaly: No Temperature / Pain Maceration: No Temperature: No Abnormality Wound Preparation Ulcer Cleansing: Rinsed/Irrigated with Saline Topical Anesthetic Applied: Other: lidocaine 4%, Electronic Signature(s) Signed: 09/25/2017 4:07:12 PM By: Roger Shelter Entered By: Roger Shelter on 09/25/2017 14:51:57 Nhem, Jomo C. ([id number]) -------------------------------------------------------------------------------- Vitals Details Patient Name: [name], [name] Date of Service: 09/25/2017 3:00 PM Medical Record Number: [id number] Patient Account Number: [id number] Date of Birth/Sex: 01/14/1961 (57 y.o. M) Treating RN: Roger Shelter Primary Care Quaniya Damas: [name] Other Clinician: Referring Evgenia Merriman: [name] Treating Terria Deschepper/Extender: [name], [name] Weeks in Treatment: 14 Vital Signs Time Taken: 14:48 Temperature (F): 98.2 Height (in): 75 Pulse (bpm): 91 Weight (lbs): 179 Blood Pressure (mmHg): 139/87 Body Mass Index (BMI): 22.4 Reference Range: 80 - 120 mg / dl Electronic Signature(s) Signed: 09/25/2017 4:07:12 PM By: Roger Shelter Entered By: Roger Shelter on 09/25/2017 14:48:36

## 2017-09-30 ENCOUNTER — Ambulatory Visit
Admission: RE | Admit: 2017-09-30 | Discharge: 2017-09-30 | Disposition: A | Discharge status: Home / Self Care | Payer: 59 | Source: Ambulatory Visit | Attending: Internal Medicine | Admitting: Internal Medicine

## 2017-09-30 DIAGNOSIS — J189 Pneumonia, unspecified organism: Secondary | ICD-10-CM | POA: Diagnosis not present

## 2017-09-30 DIAGNOSIS — J181 Lobar pneumonia, unspecified organism: Secondary | ICD-10-CM

## 2017-09-30 DIAGNOSIS — E041 Nontoxic single thyroid nodule: Secondary | ICD-10-CM | POA: Insufficient documentation

## 2017-10-02 LAB — ACID FAST CULTURE WITH REFLEXED SENSITIVITIES: ACID FAST CULTURE - AFSCU3: NEGATIVE

## 2017-10-02 LAB — ACID FAST SMEAR (AFB): ACID FAST SMEAR - AFSCU2: NEGATIVE

## 2017-10-02 LAB — ACID FAST SMEAR (AFB, MYCOBACTERIA)

## 2017-10-05 ENCOUNTER — Encounter: Payer: Self-pay | Admitting: Internal Medicine

## 2017-10-05 ENCOUNTER — Ambulatory Visit: Payer: 59 | Admitting: Internal Medicine

## 2017-10-05 VITALS — BP 134/84 | HR 91 | Resp 16 | Ht 75.0 in | Wt 266.0 lb

## 2017-10-05 DIAGNOSIS — J181 Lobar pneumonia, unspecified organism: Secondary | ICD-10-CM

## 2017-10-05 DIAGNOSIS — F17210 Nicotine dependence, cigarettes, uncomplicated: Secondary | ICD-10-CM

## 2017-10-05 DIAGNOSIS — R918 Other nonspecific abnormal finding of lung field: Secondary | ICD-10-CM | POA: Diagnosis not present

## 2017-10-05 DIAGNOSIS — R911 Solitary pulmonary nodule: Secondary | ICD-10-CM | POA: Diagnosis not present

## 2017-10-05 DIAGNOSIS — Z720 Tobacco use: Secondary | ICD-10-CM

## 2017-10-05 NOTE — Patient Instructions (Signed)
Will repeat CT chest in 6 months.

## 2017-10-05 NOTE — Progress Notes (Signed)
* [facility]     Assessment and Plan:  Pneumonia. - Consolidation lungs appear significantly improved. -Symptoms have nearly resolved.  Masslike consolidation/lung nodule. -Appears to be resolving on most recent CT chest on 09/30/2017. - Per radiology interpretation there is a left upper lobe nodule, though this may be residual from the pneumonia itself.  Repeat CT chest in about 6 months.  OSA.  --Split night study was performed on 08/04/12; AHI of 14.9; titrated to CPAP of 8.  --sat dropped to 89% on RA, patient was started on 2L [place].  --He has not been using CPAP regularly anymore at home, was intolerant, then symptoms improved after bariatric surgery and CPAP was deemed no longer necessary.   Nicotine abuse. - Discussed that smoking cessation, including various options, 3 minutes spent in discussion.  Orders Placed This Encounter  Procedures  . CT CHEST WO CONTRAST   Return in about 6 months (around 04/07/2018).     Date: 10/05/2017  MRN# [id number] [name] 03/07/1961   [name] is a 57 y.o. old male seen in follow up for chief complaint of  Chief Complaint  Patient presents with  . COPD    pt here for  1 mos f/u with CT scan/labs  . Shortness of Breath    pt is feeling much better  . Sleep Apnea    currently not on cpap     HPI:   Patient is a 57 year old male, recently seen in March 2019 inpatient with left upper lobe consolidation and masslike infiltrate in the lingula in the left lower lobe medially.  It is similar presentation a few years prior, underwent bronchoscopy which was negative and his infiltrates resolved.  On this recent admission patient was seen by ID service, he underwent sputum AFB smear x3 which were negative. He feels that he is doing well, he is gaining weight, breathing is much better, nearly back to normal. He has minimal cough, which is dry.   He is smoking about a pack per 3 days. He is not really thinking of  quitting at this time. Has tried chantix but did not help.   He has a history of OSA. He was originally diagnosed remotely but lost a tremendous amount of weight after gastric bypass in 2003. He was seen by [facility] in 2014 for OSA, and was encouraged to continue to use cpap, but is now longer using it since he had a gastric bypass.   **sleep study on 08/04/12 AHI of 14.9, CPAP titrated to 8, but did not continue to use it. **Imaging personally reviewed CT chest 09/30/2017, reviewed and compared with previous on 08/11/2017; there is a dramatic improvement in the previous masslike consolidation in the left lung.  This appears to be resolving to scar.  There is a 1 cm nodule in the left upper lobe, again likely residual from the previous pneumonia.  Medication:    Current Outpatient Medications:  .  acetaminophen (TYLENOL) 500 MG tablet, Take 1,000 mg by mouth every 8 (eight) hours as needed for mild pain or moderate pain., Disp: , Rfl:  .  albuterol (PROVENTIL HFA;VENTOLIN HFA) 108 (90 Base) MCG/ACT inhaler, Inhale 1-2 puffs into the lungs every 6 (six) hours as needed for wheezing or shortness of breath., Disp: 1 Inhaler, Rfl: 11 .  ALPRAZolam (XANAX) 1 MG tablet, Take 1 mg by mouth 3 (three) times daily as needed for anxiety. Taking 0.5 to 3 mg qd per psychiatry, Disp: ,  Rfl:  .  diclofenac (VOLTAREN) 75 MG EC tablet, Take 1 tablet (75 mg total) by mouth 2 (two) times daily as needed., Disp: 180 tablet, Rfl: 1 .  DULOXETINE HCL PO, Take 120 mg by mouth every morning. , Disp: , Rfl:  .  methocarbamol (ROBAXIN) 500 MG tablet, Take 1 tablet (500 mg total) by mouth 2 (two) times daily as needed for muscle spasms., Disp: 45 tablet, Rfl: 0 .  QUEtiapine (SEROQUEL) 300 MG tablet, Take 600 mg by mouth at bedtime. , Disp: , Rfl:  .  traMADol (ULTRAM) 50 MG tablet, Take 1 tablet (50 mg total) by mouth every 8 (eight) hours as needed., Disp: 20 tablet, Rfl: 0   Allergies:  Sulfa  antibiotics  Review of Systems: Gen:  Denies  fever, sweats. HEENT: Denies blurred vision. Cvc:  No dizziness, chest pain or heaviness Resp:   Denies cough or sputum porduction. Gi: Denies swallowing difficulty, stomach pain. constipation, bowel incontinence Gu:  Denies bladder incontinence, burning urine Ext:   No Joint pain, stiffness. Skin: No skin rash, easy bruising. Endoc:  No polyuria, polydipsia. Psych: No depression, insomnia. Other:  All other systems were reviewed and found to be negative other than what is mentioned in the HPI.   Physical Examination:   VS: There were no vitals taken for this visit.   General Appearance: No distress  Neuro:without focal findings,  speech normal,  HEENT: PERRLA, EOM intact. Pulmonary: normal breath sounds, No wheezing.   CardiovascularNormal S1,S2.  No m/r/g.   Abdomen: Benign, Soft, non-tender. Renal:  No costovertebral tenderness  GU:  Not performed at this time. Endoc: No evident thyromegaly, no signs of acromegaly. Skin:   warm, no rash. Extremities: normal, no cyanosis, clubbing.   LABORATORY PANEL:   CBC No results for input(s): WBC, HGB, HCT, PLT in the last 168 hours. ------------------------------------------------------------------------------------------------------------------  Chemistries  No results for input(s): NA, K, CL, CO2, GLUCOSE, BUN, CREATININE, CALCIUM, MG, AST, ALT, ALKPHOS, BILITOT in the last 168 hours.  Invalid input(s): GFRCGP ------------------------------------------------------------------------------------------------------------------  Cardiac Enzymes No results for input(s): TROPONINI in the last 168 hours. ------------------------------------------------------------  RADIOLOGY:   No results found for this or any previous visit. Results for orders placed in visit on 07/07/17  DG Chest 2 View   Narrative CLINICAL DATA:  Shortness of breath.  Reported recent pneumonia  EXAM: CHEST  2  VIEW  COMPARISON:  January 24, 2017  FINDINGS: There is scarring in the right mid lung region, stable. There is no appreciable edema or consolidation. Heart size and pulmonary vascularity are normal. No adenopathy. No bone lesions. There are areas of mild eventration along each hemidiaphragm, stable.  IMPRESSION: Scarring right mid lung region anteriorly. No edema or consolidation. Stable cardiac silhouette.   Electronically Signed   By: Lowella Grip III M.D.   On: 07/08/2017 08:54    ------------------------------------------------------------------------------------------------------------------  Thank  you for allowing [facility] to assist in the care of your patient. Our recommendations are noted above.  Please contact us if we can be of further service.   Marda Stalker, MD.  [facility] Office Number: [phone]  Patricia Pesa, M.D.  Merton Border, M.D  10/05/2017

## 2017-10-09 ENCOUNTER — Encounter: Payer: 59 | Attending: Physician Assistant | Admitting: Physician Assistant

## 2017-10-09 DIAGNOSIS — I890 Lymphedema, not elsewhere classified: Secondary | ICD-10-CM | POA: Insufficient documentation

## 2017-10-09 DIAGNOSIS — E669 Obesity, unspecified: Secondary | ICD-10-CM | POA: Diagnosis not present

## 2017-10-09 DIAGNOSIS — Z872 Personal history of diseases of the skin and subcutaneous tissue: Secondary | ICD-10-CM | POA: Diagnosis not present

## 2017-10-09 DIAGNOSIS — L97829 Non-pressure chronic ulcer of other part of left lower leg with unspecified severity: Secondary | ICD-10-CM | POA: Diagnosis not present

## 2017-10-09 DIAGNOSIS — Z6822 Body mass index (BMI) 22.0-22.9, adult: Secondary | ICD-10-CM | POA: Insufficient documentation

## 2017-10-09 DIAGNOSIS — Z09 Encounter for follow-up examination after completed treatment for conditions other than malignant neoplasm: Secondary | ICD-10-CM | POA: Diagnosis not present

## 2017-10-09 DIAGNOSIS — I872 Venous insufficiency (chronic) (peripheral): Secondary | ICD-10-CM | POA: Diagnosis not present

## 2017-10-12 NOTE — Progress Notes (Signed)
KIT, MOLLETT ([id number]) Visit Report for 10/09/2017 Arrival Information Details Patient Name: [name], [name] Date of Service: 10/09/2017 2:45 PM Medical Record Number: [id number] Patient Account Number: [id number] Date of Birth/Sex: [birth year] (57 y.o. M) Treating RN: Roger Shelter Primary Care Salaam Battershell: [name] Other Clinician: Referring Kianah Harries: [name] Treating Isbella Arline/Extender: [name], [name] Weeks in Treatment: 16 Visit Information History Since Last Visit All ordered tests and consults were completed: No Patient Arrived: Ambulatory Added or deleted any medications: No Arrival Time: 15:14 Any new allergies or adverse reactions: No Accompanied By: self Had a fall or experienced change in No Transfer Assistance: None activities of daily living that may affect Patient Requires Transmission-Based No risk of falls: Precautions: Signs or symptoms of abuse/neglect since last visito No Patient Has Alerts: No Hospitalized since last visit: No Implantable device outside of the clinic excluding No cellular tissue based products placed in the center since last visit: Has Dressing in Place as Prescribed: Yes Pain Present Now: No Electronic Signature(s) Signed: 10/09/2017 3:34:23 PM By: Roger Shelter Entered By: Roger Shelter on 10/09/2017 15:15:58 [name], [name] ([id number]) -------------------------------------------------------------------------------- Clinic Level of Care Assessment Details Patient Name: Salazar, [name] Date of Service: 10/09/2017 2:45 PM Medical Record Number: [id number] Patient Account Number: [id number] Date of Birth/Sex: February 13, 1961 (57 y.o. M) Treating RN: Montey Hora Primary Care Shion Bluestein: [name] Other Clinician: Referring Chivas Notz: [name] Treating Latriece Anstine/Extender: [name], [name] Weeks in Treatment: 16 Clinic Level of Care Assessment Items TOOL 4 Quantity Score []  - Use when only an  EandM is performed on FOLLOW-UP visit 0 ASSESSMENTS - Nursing Assessment / Reassessment X - Reassessment of Co-morbidities (includes updates in patient status) 1 10 X- 1 5 Reassessment of Adherence to Treatment Plan ASSESSMENTS - Wound and Skin Assessment / Reassessment X - Simple Wound Assessment / Reassessment - one wound 1 5 []  - 0 Complex Wound Assessment / Reassessment - multiple wounds []  - 0 Dermatologic / Skin Assessment (not related to wound area) ASSESSMENTS - Focused Assessment X - Circumferential Edema Measurements - multi extremities 1 5 []  - 0 Nutritional Assessment / Counseling / Intervention X- 1 5 Lower Extremity Assessment (monofilament, tuning fork, pulses) []  - 0 Peripheral Arterial Disease Assessment (using hand held doppler) ASSESSMENTS - Ostomy and/or Continence Assessment and Care []  - Incontinence Assessment and Management 0 []  - 0 Ostomy Care Assessment and Management (repouching, etc.) PROCESS - Coordination of Care X - Simple Patient / Family Education for ongoing care 1 15 []  - 0 Complex (extensive) Patient / Family Education for ongoing care []  - 0 Staff obtains Programmer, systems, Records, Test Results / Process Orders []  - 0 Staff telephones HHA, Nursing Homes / Clarify orders / etc []  - 0 Routine Transfer to another Facility (non-emergent condition) []  - 0 Routine Hospital Admission (non-emergent condition) []  - 0 New Admissions / Biomedical engineer / Ordering NPWT, Apligraf, etc. []  - 0 Emergency Hospital Admission (emergent condition) X- 1 10 Simple Discharge Coordination [name], [name] ([id number]) []  - 0 Complex (extensive) Discharge Coordination PROCESS - Special Needs []  - Pediatric / Minor Patient Management 0 []  - 0 Isolation Patient Management []  - 0 Hearing / Language / Visual special needs []  - 0 Assessment of Community assistance (transportation, D/C planning, etc.) []  - 0 Additional assistance / Altered mentation []  -  0 Support Surface(s) Assessment (bed, cushion, seat, etc.) INTERVENTIONS - Wound Cleansing / Measurement X - Simple Wound Cleansing - one wound 1 5 []  - 0 Complex Wound  Cleansing - multiple wounds X- 1 5 Wound Imaging (photographs - any number of wounds) []  - 0 Wound Tracing (instead of photographs) X- 1 5 Simple Wound Measurement - one wound []  - 0 Complex Wound Measurement - multiple wounds INTERVENTIONS - Wound Dressings []  - Small Wound Dressing one or multiple wounds 0 []  - 0 Medium Wound Dressing one or multiple wounds []  - 0 Large Wound Dressing one or multiple wounds []  - 0 Application of Medications - topical []  - 0 Application of Medications - injection INTERVENTIONS - Miscellaneous []  - External ear exam 0 []  - 0 Specimen Collection (cultures, biopsies, blood, body fluids, etc.) []  - 0 Specimen(s) / Culture(s) sent or taken to Lab for analysis []  - 0 Patient Transfer (multiple staff / Civil Service fast streamer / Similar devices) []  - 0 Simple Staple / Suture removal (25 or less) []  - 0 Complex Staple / Suture removal (26 or more) []  - 0 Hypo / Hyperglycemic Management (close monitor of Blood Glucose) []  - 0 Ankle / Brachial Index (ABI) - do not check if billed separately X- 1 5 Vital Signs [name], [name] ([id number]) Has the patient been seen at the hospital within the last three years: Yes Total Score: 75 Level Of Care: New/Established - Level 2 Electronic Signature(s) Signed: 10/09/2017 4:42:41 PM By: Montey Hora Entered By: Montey Hora on 10/09/2017 16:06:01 [name], [name] ([id number]) -------------------------------------------------------------------------------- Encounter Discharge Information Details Patient Name: [name], [name] Date of Service: 10/09/2017 2:45 PM Medical Record Number: [id number] Patient Account Number: [id number] Date of Birth/Sex: 1960/06/22 (57 y.o. M) Treating RN: Montey Hora Primary Care Syerra Abdelrahman: [name] Other  Clinician: Referring Cyana Shook: [name] Treating Blonnie Maske/Extender: [name], [name] Weeks in Treatment: 16 Encounter Discharge Information Items Discharge Condition: Stable Ambulatory Status: Ambulatory Discharge Destination: Home Transportation: Private Auto Accompanied By: self Schedule Follow-up Appointment: No Clinical Summary of Care: Electronic Signature(s) Signed: 10/09/2017 4:42:41 PM By: Montey Hora Entered By: Montey Hora on 10/09/2017 16:06:20 Tesoro, Jermal C. ([id number]) -------------------------------------------------------------------------------- Lower Extremity Assessment Details Patient Name: Purnell, Don C. Date of Service: 10/09/2017 2:45 PM Medical Record Number: [id number] Patient Account Number: [id number] Date of Birth/Sex: 01-18-1961 (56 y.o. M) Treating RN: Roger Shelter Primary Care Tatanisha Cuthbert: [name] Other Clinician: Referring Shadell Brenn: [name] Treating Martez Weiand/Extender: [name], [name] Weeks in Treatment: 16 Vascular Assessment Pulses: Dorsalis Pedis Palpable: [Left:Yes] Posterior Tibial Extremity colors, hair growth, and conditions: Extremity Color: [Left:Normal] Temperature of Extremity: [Left:Warm] Capillary Refill: [Left:< 3 seconds] Toe Nail Assessment Left: Right: Thick: Yes Discolored: Yes Deformed: Yes Improper Length and Hygiene: Yes Electronic Signature(s) Signed: 10/09/2017 3:34:23 PM By: Roger Shelter Entered By: Roger Shelter on 10/09/2017 15:21:25 [name], [name] Loletha Grayer ([id number]) -------------------------------------------------------------------------------- Multi-Disciplinary Care Plan Details Patient Name: [name], [name] Date of Service: 10/09/2017 2:45 PM Medical Record Number: [id number] Patient Account Number: [id number] Date of Birth/Sex: 1960-06-14 (57 y.o. M) Treating RN: Montey Hora Primary Care Elek Holderness: [name] Other Clinician: Referring Tamlyn Sides:  [name] Treating Kayon Dozier/Extender: [name], [name] Weeks in Treatment: 16 Active Inactive Electronic Signature(s) Signed: 10/09/2017 4:42:41 PM By: Montey Hora Entered By: Montey Hora on 10/09/2017 15:58:57 Larocque, Tarique C. ([id number]) -------------------------------------------------------------------------------- Pain Assessment Details Patient Name: [name], [name] Date of Service: 10/09/2017 2:45 PM Medical Record Number: [id number] Patient Account Number: [id number] Date of Birth/Sex: December 24, 1960 (57 y.o. M) Treating RN: Roger Shelter Primary Care Shondrea Steinert: [name] Other Clinician: Referring Donnis Pecha: [name] Treating Lenford Beddow/Extender: [name], [name] Weeks in Treatment: 16 Active Problems Location of Pain Severity and Description  of Pain Patient Has Paino No Site Locations Pain Management and Medication Current Pain Management: Electronic Signature(s) Signed: 10/09/2017 3:34:23 PM By: Roger Shelter Entered By: Roger Shelter on 10/09/2017 15:16:04 [name], [name] ([id number]) -------------------------------------------------------------------------------- Patient/Caregiver Education Details Patient Name: Demeritt, Garfield C. Date of Service: 10/09/2017 2:45 PM Medical Record Number: [id number] Patient Account Number: [id number] Date of Birth/Gender: 1961/02/05 (57 y.o. M) Treating RN: Montey Hora Primary Care Physician: [name] Other Clinician: Referring Physician: [name] Treating Physician/Extender: Sharalyn Ink in Treatment: 16 Education Assessment Education Provided To: Patient Education Topics Provided Basic Hygiene: Handouts: Other: care of newly healed ulcer site Methods: Explain/Verbal Responses: State content correctly Electronic Signature(s) Signed: 10/09/2017 4:42:41 PM By: Montey Hora Entered By: Montey Hora on 10/09/2017 16:06:40 Southern, Remi C.  ([id number]) -------------------------------------------------------------------------------- Wound Assessment Details Patient Name: Flury, Rodney C. Date of Service: 10/09/2017 2:45 PM Medical Record Number: [id number] Patient Account Number: [id number] Date of Birth/Sex: 10/24/1960 (57 y.o. M) Treating RN: Montey Hora Primary Care Kairen Hallinan: [name] Other Clinician: Referring Leesha Veno: [name] Treating Crickett Abbett/Extender: STONE III, [name] Weeks in Treatment: 16 Wound Status Wound Number: 2 Primary Etiology: Lymphedema Wound Location: Left, Distal, Medial Lower Leg Wound Status: Healed - Epithelialized Wounding Event: Gradually Appeared Comorbid History: Sleep Apnea, Confinement Anxiety Date Acquired: 01/17/2017 Weeks Of Treatment: 16 Clustered Wound: No Wound Measurements Length: (cm) 0 % Red Width: (cm) 0 % Red Depth: (cm) 0 Epith Area: (cm) 0 Tunn Volume: (cm) 0 Unde uction in Area: 100% uction in Volume: 100% elialization: Medium (34-66%) eling: No rmining: No Wound Description Full Thickness Without Exposed Support Foul Classification: Structures Sloug Wound Margin: Distinct, outline attached Exudate None Present Amount: Odor After Cleansing: No h/Fibrino Yes Wound Bed Granulation Amount: None Present (0%) Exposed Structure Necrotic Amount: Large (67-100%) Fascia Exposed: No Necrotic Quality: Eschar Fat Layer (Subcutaneous Tissue) Exposed: No Tendon Exposed: No Muscle Exposed: No Joint Exposed: No Bone Exposed: No Periwound Skin Texture Texture Color No Abnormalities Noted: No No Abnormalities Noted: No Callus: No Atrophie Blanche: No Crepitus: No Cyanosis: No Excoriation: No Ecchymosis: No Induration: No Erythema: No Rash: No Hemosiderin Staining: No Scarring: Yes Mottled: No Pallor: No Moisture Rubor: No No Abnormalities Noted: No Dry / Scaly: No Temperature / Pain Maceration: No Temperature: No  Abnormality Rybka, Reyhan C. ([id number]) Wound Preparation Ulcer Cleansing: Rinsed/Irrigated with Saline Topical Anesthetic Applied: Other: lidocaine 4%, Electronic Signature(s) Signed: 10/09/2017 4:42:41 PM By: Montey Hora Previous Signature: 10/09/2017 3:34:23 PM Version By: Roger Shelter Entered By: Montey Hora on 10/09/2017 15:58:45 Gurney, Ethridge C. ([id number]) -------------------------------------------------------------------------------- Vitals Details Patient Name: Ferrone, Daquane C. Date of Service: 10/09/2017 2:45 PM Medical Record Number: [id number] Patient Account Number: [id number] Date of Birth/Sex: 04-Nov-1960 (57 y.o. M) Treating RN: Roger Shelter Primary Care Brooklee Michelin: [name] Other Clinician: Referring Donald Jacque: [name] Treating Tylesha Gibeault/Extender: [name], [name] Weeks in Treatment: 16 Vital Signs Time Taken: 15:16 Temperature (F): 97.8 Height (in): 75 Pulse (bpm): 79 Weight (lbs): 179 Respiratory Rate (breaths/min): 18 Body Mass Index (BMI): 22.4 Blood Pressure (mmHg): 135/89 Reference Range: 80 - 120 mg / dl Electronic Signature(s) Signed: 10/09/2017 3:34:23 PM By: Roger Shelter Entered By: Roger Shelter on 10/09/2017 15:20:12

## 2017-10-12 NOTE — Progress Notes (Signed)
HESTON, WIDENER ([id number]) Visit Report for 10/09/2017 Chief Complaint Document Details Patient Name: [name], [name]. Date of Service: 10/09/2017 2:45 PM Medical Record Number: [id number] Patient Account Number: [id number] Date of Birth/Sex: 06-Sep-1960 (57 y.o. M) Treating RN: Primary Care Provider: [name] Other Clinician: Referring Provider: [name] Treating Provider/Extender: [name], [name] Weeks in Treatment: 16 Information Obtained from: Patient Chief Complaint He is here in follow up for lle venous ulcers Electronic Signature(s) Signed: 10/10/2017 10:27:37 AM By: Worthy Keeler PA-[name] Entered By: Worthy Keeler on 10/09/2017 15:35:18 [name], [name]. ([id number]) -------------------------------------------------------------------------------- HPI Details Patient Name: [name], Jjesus [name]. Date of Service: 10/09/2017 2:45 PM Medical Record Number: [id number] Patient Account Number: [id number] Date of Birth/Sex: 1961/05/16 (57 y.o. M) Treating RN: Primary Care Provider: [name] Other Clinician: Referring Provider: [name] Treating Provider/Extender: [name], [name] Weeks in Treatment: 16 History of Present Illness HPI Description: 06/25/17-he is here in follow-up evaluation for left lower extremity, medial malleolus, venous ulcers. He is compliant with compression stocking where although his compression stocking does not fit appropriately in length. He has been advised to contact elastic therapy, who measured and provided his current stockings, regarding a more appropriate fitted stocking. He is voicing no complaints or concerns, tolerated debridement. There is improvement in appearance, no significant change in measurements. We will continue with Santyl ointment, will apply to all 3 wounds. He will follow-up next week 06/30/17-he is here earlier than his scheduled appointment on Friday, per his request, secondary to new open areas.  After inquiry, it appears that the new superficial open areas are secondary to adhesive removal. There is no evidence of infection, minimal/scant drainage, most of the new areas are epithelialized. His chronic wounds are stable in appearance, will continue with Santyl. He wants to maintain Friday appointment and will follow-up next Friday, he was encouraged to contact clinic with any ulcer changes or concerns 07/24/17 On evaluation today patient appears to be doing well in regard to left lower extremity ulcer. He has been tolerating the dressing changes without complication. He still continues to use the [place] which does seem to be helpful for him this is excellent news. Definitely no evidence of infection today which is good news. Overall I'm pleased with how things are going. Patient likewise is also pleased. 07/31/17 on evaluation today patient's wound on the left medial lower extremity appears to be doing okay although he is having a little bit of maceration noted at this point. Obviously this may be due to the drainage coupled with the fact we have been using the Santyl and trying to keep this voice. Obviously it looks as if the wound is a little bit too macerated at this time. Fortunately there does not appear to be any evidence of infection which is good news. No fevers, chills, nausea, or vomiting noted at this time. 08/21/17 on evaluation today patient appears to be doing very well in regard to his left lower extremity ulcers. In fact one of the two these have completely healed at this point. The other is doing much better it is smaller and there is good epithelialization no significant adherent slough noted at this point. He has been tolerating the dressing changes without complication. There does not appear to be any evidence of infection. Unfortunately since I last saw him he has been in the hospital due to pneumonia. The good news is he seems to be improving following his hospital stay he  still seems a little bit fatigued in  my opinion he states that is indeed the case. Overall the wound does appear to be better. 09/04/17 on evaluation today patient appears to be doing very well in regard to his left lower extremity ulcer. He has been tolerating the dressing changes without complication he continues to make progress week by week which is good news. Overall he states the only complication he sees is that he wishes this will move along more quickly. 09/11/17 on evaluation today patient appears to be doing very well in regard to his lower extremity ulcer. He has been tolerating the dressing changes without complication specifically the wraps and seems to be doing very well. One of his two ulcers has actually healed on evaluation today and the other is significantly smaller. I do believe things are making excellent progress. 09/18/17 on evaluation today patient appears to be doing excellent in regard to his lower extremity ulcer on the left lower extremity. The previously healed site is still healed and appears to be doing well. The last remaining opening is much smaller and show signs of improving. 09/25/17 on evaluation today patient appears to actually be doing very well in regard to his ulcer. He has been tolerating the dressing changes without complication. This all and all appears to be almost completely healed. He still has one open area remaining that we are still working on. Otherwise he is having no discomfort which is good news. HERBERT, [name] ([id number]) 10/09/17 on evaluation today patient appears to show signs of being completely healed. There was a little area that was somewhat discolored but upon further inspection there appears to be complete epithelialization over top of this which is good news. Overall he is having no pain which is also good and I do feel like he has progressed very nicely. Electronic Signature(s) Signed: 10/10/2017 10:27:37 AM By: Worthy Keeler  PA-[name] Entered By: Worthy Keeler on 10/10/2017 10:17:12 [name], [name] ([id number]) -------------------------------------------------------------------------------- Physical Exam Details Patient Name: [name], [name]. Date of Service: 10/09/2017 2:45 PM Medical Record Number: [id number] Patient Account Number: [id number] Date of Birth/Sex: 11/24/1960 (57 y.o. M) Treating RN: Primary Care Provider: [name] Other Clinician: Referring Provider: [name] Treating Provider/Extender: STONE III, [name] Weeks in Treatment: 91 Constitutional Well-nourished and well-hydrated in no acute distress. Respiratory normal breathing without difficulty. Psychiatric this patient is able to make decisions and demonstrates good insight into disease process. Alert and Oriented x 3. pleasant and cooperative. Notes There was no open wound noted upon inspection today which is excellent. Unfortunately the patient did seem to be somewhat depressed although he would not tell me about anything that is going on. Electronic Signature(s) Signed: 10/10/2017 10:27:37 AM By: Worthy Keeler PA-[name] Entered By: Worthy Keeler on 10/10/2017 10:18:04 [name], [name] Claude ([id number]) -------------------------------------------------------------------------------- Physician Orders Details Patient Name: [name], [name]. Date of Service: 10/09/2017 2:45 PM Medical Record Number: [id number] Patient Account Number: [id number] Date of Birth/Sex: [birth year] (57 y.o. M) Treating RN: Montey Hora Primary Care Provider: [name] Other Clinician: Referring Provider: [name] Treating Provider/Extender: [name], [name] Weeks in Treatment: 16 Verbal / Phone Orders: No Diagnosis Coding ICD-10 Coding Code Description I89.0 Lymphedema, not elsewhere classified L97.822 Non-pressure chronic ulcer of other part of left lower leg with fat layer exposed E66.09 Other obesity due to excess  calories Discharge From [facility] o Discharge from [facility] Signature(s) Signed: 10/09/2017 4:42:41 PM By: Montey Hora Signed: 10/10/2017 10:27:37 AM By: Worthy Keeler PA-[name] Entered By: Marjory Lies,  Joanna on 10/09/2017 15:59:19 Elias, Tia [name]. ([id number]) -------------------------------------------------------------------------------- Problem List Details Patient Name: [name], [name]. Date of Service: 10/09/2017 2:45 PM Medical Record Number: [id number] Patient Account Number: [id number] Date of Birth/Sex: 1961-03-02 (57 y.o. M) Treating RN: Primary Care Provider: [name] Other Clinician: Referring Provider: [name] Treating Provider/Extender: [name], [name] Weeks in Treatment: 16 Active Problems ICD-10 Impacting Encounter Code Description Active Date Wound Healing Diagnosis I89.0 Lymphedema, not elsewhere classified 06/22/2017 Yes L97.822 Non-pressure chronic ulcer of other part of left lower leg with 06/22/2017 Yes fat layer exposed E66.09 Other obesity due to excess calories 06/22/2017 Yes Inactive Problems Resolved Problems Electronic Signature(s) Signed: 10/10/2017 10:27:37 AM By: Worthy Keeler PA-[name] Entered By: Worthy Keeler on 10/09/2017 15:35:11 [name], [name]. ([id number]) -------------------------------------------------------------------------------- Progress Note Details Patient Name: [name], [name]. Date of Service: 10/09/2017 2:45 PM Medical Record Number: [id number] Patient Account Number: [id number] Date of Birth/Sex: [birth year] (57 y.o. M) Treating RN: Primary Care Provider: [name] Other Clinician: Referring Provider: [name] Treating Provider/Extender: [name], [name] Weeks in Treatment: 16 Subjective Chief Complaint Information obtained from Patient He is here in follow up for lle venous ulcers History of Present Illness (HPI) 06/25/17-he is here in follow-up evaluation for left lower  extremity, medial malleolus, venous ulcers. He is compliant with compression stocking where although his compression stocking does not fit appropriately in length. He has been advised to contact elastic therapy, who measured and provided his current stockings, regarding a more appropriate fitted stocking. He is voicing no complaints or concerns, tolerated debridement. There is improvement in appearance, no significant change in measurements. We will continue with Santyl ointment, will apply to all 3 wounds. He will follow-up next week 06/30/17-he is here earlier than his scheduled appointment on Friday, per his request, secondary to new open areas. After inquiry, it appears that the new superficial open areas are secondary to adhesive removal. There is no evidence of infection, minimal/scant drainage, most of the new areas are epithelialized. His chronic wounds are stable in appearance, will continue with Santyl. He wants to maintain Friday appointment and will follow-up next Friday, he was encouraged to contact clinic with any ulcer changes or concerns 07/24/17 On evaluation today patient appears to be doing well in regard to left lower extremity ulcer. He has been tolerating the dressing changes without complication. He still continues to use the [place] which does seem to be helpful for him this is excellent news. Definitely no evidence of infection today which is good news. Overall I'm pleased with how things are going. Patient likewise is also pleased. 07/31/17 on evaluation today patient's wound on the left medial lower extremity appears to be doing okay although he is having a little bit of maceration noted at this point. Obviously this may be due to the drainage coupled with the fact we have been using the Santyl and trying to keep this voice. Obviously it looks as if the wound is a little bit too macerated at this time. Fortunately there does not appear to be any evidence of infection which is  good news. No fevers, chills, nausea, or vomiting noted at this time. 08/21/17 on evaluation today patient appears to be doing very well in regard to his left lower extremity ulcers. In fact one of the two these have completely healed at this point. The other is doing much better it is smaller and there is good epithelialization no significant adherent slough noted at this point. He has been tolerating the  dressing changes without complication. There does not appear to be any evidence of infection. Unfortunately since I last saw him he has been in the hospital due to pneumonia. The good news is he seems to be improving following his hospital stay he still seems a little bit fatigued in my opinion he states that is indeed the case. Overall the wound does appear to be better. 09/04/17 on evaluation today patient appears to be doing very well in regard to his left lower extremity ulcer. He has been tolerating the dressing changes without complication he continues to make progress week by week which is good news. Overall he states the only complication he sees is that he wishes this will move along more quickly. 09/11/17 on evaluation today patient appears to be doing very well in regard to his lower extremity ulcer. He has been tolerating the dressing changes without complication specifically the wraps and seems to be doing very well. One of his two ulcers has actually healed on evaluation today and the other is significantly smaller. I do believe things are making excellent progress. 09/18/17 on evaluation today patient appears to be doing excellent in regard to his lower extremity ulcer on the left lower extremity. The previously healed site is still healed and appears to be doing well. The last remaining opening is much smaller and show signs of improving. [name], [name] ([id number]) 09/25/17 on evaluation today patient appears to actually be doing very well in regard to his ulcer. He has been tolerating  the dressing changes without complication. This all and all appears to be almost completely healed. He still has one open area remaining that we are still working on. Otherwise he is having no discomfort which is good news. 10/09/17 on evaluation today patient appears to show signs of being completely healed. There was a little area that was somewhat discolored but upon further inspection there appears to be complete epithelialization over top of this which is good news. Overall he is having no pain which is also good and I do feel like he has progressed very nicely. Patient History Information obtained from Patient. Family History Cancer - Father, Heart Disease - Father,Siblings, No family history of Diabetes, Hereditary Spherocytosis, Hypertension, Kidney Disease, Lung Disease, Seizures, Stroke, Thyroid Problems, Tuberculosis. Social History Current some day smoker, Marital Status - Separated, Alcohol Use - Rarely, Drug Use - No History, Caffeine Use - Daily. Review of Systems (ROS) Constitutional Symptoms (General Health) Denies complaints or symptoms of Fatigue, Fever, Chills. Psychiatric The patient has no complaints or symptoms. Objective Constitutional Well-nourished and well-hydrated in no acute distress. Vitals Time Taken: 3:16 PM, Height: 75 in, Weight: 179 lbs, BMI: 22.4, Temperature: 97.8 F, Pulse: 79 bpm, Respiratory Rate: 18 breaths/min, Blood Pressure: 135/89 mmHg. Respiratory normal breathing without difficulty. Psychiatric this patient is able to make decisions and demonstrates good insight into disease process. Alert and Oriented x 3. pleasant and cooperative. General Notes: There was no open wound noted upon inspection today which is excellent. Unfortunately the patient did seem to be somewhat depressed although he would not tell me about anything that is going on. Integumentary (Hair, Skin) Wound #2 status is Healed - Epithelialized. Original cause of wound was  Gradually Appeared. The wound is located on the Left,Distal,Medial Lower Leg. The wound measures 0cm length x 0cm width x 0cm depth; 0cm^2 area and 0cm^3 volume. There is no tunneling or undermining noted. There is a none present amount of drainage noted. The wound margin is distinct  Courts, Tenzin [name]. ([id number]) with the outline attached to the wound base. There is no granulation within the wound bed. There is a large (67-100%) amount of necrotic tissue within the wound bed including Eschar. The periwound skin appearance exhibited: Scarring. The periwound skin appearance did not exhibit: Callus, Crepitus, Excoriation, Induration, Rash, Dry/Scaly, Maceration, Atrophie Blanche, Cyanosis, Ecchymosis, Hemosiderin Staining, Mottled, Pallor, Rubor, Erythema. Periwound temperature was noted as No Abnormality. Assessment Active Problems ICD-10 I89.0 - Lymphedema, not elsewhere classified L97.822 - Non-pressure chronic ulcer of other part of left lower leg with fat layer exposed E66.09 - Other obesity due to excess calories Plan Discharge From [facility]: Discharge from [facility] discontinue wound care services as he is completely healed. He will need to continue to wear his compression stockings. We will see him in the future as needed if he has any concerns. Electronic Signature(s) Signed: 10/10/2017 10:27:37 AM By: Worthy Keeler PA-[name] Entered By: Worthy Keeler on 10/10/2017 10:18:40 [name], [name] CMarland [name] ([id number]) -------------------------------------------------------------------------------- ROS/PFSH Details Patient Name: [name], [name]. Date of Service: 10/09/2017 2:45 PM Medical Record Number: [id number] Patient Account Number: [id number] Date of Birth/Sex: [birth year] (57 y.o. M) Treating RN: Primary Care Provider: [name] Other Clinician: Referring Provider: [name] Treating Provider/Extender: [name], [name] Weeks in Treatment:  16 Information Obtained From Patient Wound History Do you currently have one or more open woundso Yes How many open wounds do you currently haveo 3 Approximately how long have you had your woundso several months How have you been treating your wound(s) until nowo xeroform gauze Has your wound(s) ever healed and then re-openedo No Have you had any lab work done in the past montho No Have you tested positive for an antibiotic resistant organism (MRSA, VRE)o No Have you tested positive for osteomyelitis (bone infection)o No Have you had any tests for circulation on your legso No Constitutional Symptoms (General Health) Complaints and Symptoms: Negative for: Fatigue; Fever; Chills Eyes Medical History: Negative for: Cataracts; Glaucoma; Optic Neuritis Hematologic/Lymphatic Medical History: Negative for: Anemia; Hemophilia; Human Immunodeficiency Virus; Lymphedema; Sickle Cell Disease Respiratory Medical History: Positive for: Sleep Apnea Negative for: Aspiration; Asthma; Chronic Obstructive Pulmonary Disease (COPD); Pneumothorax; Tuberculosis Cardiovascular Medical History: Negative for: Arrhythmia; Congestive Heart Failure; Coronary Artery Disease; Deep Vein Thrombosis; Hypertension; Hypotension; Myocardial Infarction; Peripheral Arterial Disease; Peripheral Venous Disease; Phlebitis; Vasculitis Gastrointestinal Medical History: Negative for: Cirrhosis ; Colitis; Crohnos; Hepatitis A; Hepatitis B; Hepatitis [name] Endocrine Medical History: Negative for: Type I Diabetes; Type II Diabetes [name], [name]. ([id number]) Genitourinary Medical History: Negative for: End Stage Renal Disease Immunological Medical History: Negative for: Lupus Erythematosus; Raynaudos; Scleroderma Integumentary (Skin) Medical History: Negative for: History of Burn Musculoskeletal Medical History: Negative for: Gout; Rheumatoid Arthritis; Osteoarthritis; Osteomyelitis Neurologic Medical History: Negative  for: Dementia; Neuropathy; Paraplegia; Seizure Disorder Psychiatric Complaints and Symptoms: No Complaints or Symptoms Medical History: Positive for: Confinement Anxiety Negative for: Anorexia/bulimia Immunizations Pneumococcal Vaccine: Received Pneumococcal Vaccination: No Implantable Devices Family and Social History Cancer: Yes - Father; Diabetes: No; Heart Disease: Yes - Father,Siblings; Hereditary Spherocytosis: No; Hypertension: No; Kidney Disease: No; Lung Disease: No; Seizures: No; Stroke: No; Thyroid Problems: No; Tuberculosis: No; Current some day smoker; Marital Status - Separated; Alcohol Use: Rarely; Drug Use: No History; Caffeine Use: Daily; Financial Concerns: No; Food, Clothing or Shelter Needs: No; Support System Lacking: No; Transportation Concerns: No; Advanced Directives: No; Patient does not want information on Advanced Directives; Do not resuscitate: No; Living Will: No; Medical  Power of Attorney: No Physician Affirmation I have reviewed and agree with the above information. Electronic Signature(s) Signed: 10/10/2017 10:27:37 AM By: Worthy Keeler PA-[name] Entered By: Worthy Keeler on 10/10/2017 10:17:26 [name], [name] ([id number]) -------------------------------------------------------------------------------- SuperBill Details Patient Name: Amadon, [name]. Date of Service: 10/09/2017 Medical Record Number: [id number] Patient Account Number: [id number] Date of Birth/Sex: Dec 23, 1960 (57 y.o. M) Treating RN: Primary Care Provider: [name] Other Clinician: Referring Provider: [name] Treating Provider/Extender: [name], [name] Weeks in Treatment: 16 Diagnosis Coding ICD-10 Codes Code Description I89.0 Lymphedema, not elsewhere classified L97.822 Non-pressure chronic ulcer of other part of left lower leg with fat layer exposed E66.09 Other obesity due to excess calories Facility Procedures CPT4 Code: [id number] Description: 76195 - WOUND  CARE VISIT-LEV 2 EST PT Modifier: Quantity: 1 Physician Procedures CPT4 Code Description: [id number] - WC PHYS LEVEL 2 - EST PT ICD-10 Diagnosis Description I89.0 Lymphedema, not elsewhere classified L97.822 Non-pressure chronic ulcer of other part of left lower leg wit E66.09 Other obesity due to excess calories Modifier: h fat layer expos Quantity: 1 ed Electronic Signature(s) Signed: 10/10/2017 10:27:37 AM By: Worthy Keeler PA-[name] Entered By: Worthy Keeler on 10/10/2017 10:18:53

## 2017-10-21 DIAGNOSIS — M5126 Other intervertebral disc displacement, lumbar region: Secondary | ICD-10-CM | POA: Diagnosis not present

## 2017-10-21 DIAGNOSIS — M5416 Radiculopathy, lumbar region: Secondary | ICD-10-CM | POA: Diagnosis not present

## 2017-10-31 DIAGNOSIS — I82409 Acute embolism and thrombosis of unspecified deep veins of unspecified lower extremity: Secondary | ICD-10-CM

## 2017-10-31 HISTORY — DX: Acute embolism and thrombosis of unspecified deep veins of unspecified lower extremity: I82.409

## 2017-11-03 ENCOUNTER — Emergency Department: Payer: 59

## 2017-11-03 ENCOUNTER — Other Ambulatory Visit: Payer: Self-pay

## 2017-11-03 ENCOUNTER — Encounter: Payer: Self-pay | Admitting: *Deleted

## 2017-11-03 ENCOUNTER — Observation Stay
Admission: EM | Admit: 2017-11-03 | Discharge: 2017-11-05 | Disposition: A | Discharge status: Home / Self Care | Payer: 59 | Attending: Internal Medicine | Admitting: Internal Medicine

## 2017-11-03 DIAGNOSIS — F319 Bipolar disorder, unspecified: Secondary | ICD-10-CM | POA: Diagnosis not present

## 2017-11-03 DIAGNOSIS — W109XXA Fall (on) (from) unspecified stairs and steps, initial encounter: Secondary | ICD-10-CM | POA: Insufficient documentation

## 2017-11-03 DIAGNOSIS — G894 Chronic pain syndrome: Secondary | ICD-10-CM | POA: Insufficient documentation

## 2017-11-03 DIAGNOSIS — K219 Gastro-esophageal reflux disease without esophagitis: Secondary | ICD-10-CM | POA: Insufficient documentation

## 2017-11-03 DIAGNOSIS — Z882 Allergy status to sulfonamides status: Secondary | ICD-10-CM | POA: Insufficient documentation

## 2017-11-03 DIAGNOSIS — R350 Frequency of micturition: Secondary | ICD-10-CM | POA: Diagnosis not present

## 2017-11-03 DIAGNOSIS — G9389 Other specified disorders of brain: Secondary | ICD-10-CM | POA: Diagnosis not present

## 2017-11-03 DIAGNOSIS — N401 Enlarged prostate with lower urinary tract symptoms: Secondary | ICD-10-CM | POA: Diagnosis not present

## 2017-11-03 DIAGNOSIS — F17210 Nicotine dependence, cigarettes, uncomplicated: Secondary | ICD-10-CM | POA: Diagnosis not present

## 2017-11-03 DIAGNOSIS — M19071 Primary osteoarthritis, right ankle and foot: Secondary | ICD-10-CM | POA: Insufficient documentation

## 2017-11-03 DIAGNOSIS — Z8249 Family history of ischemic heart disease and other diseases of the circulatory system: Secondary | ICD-10-CM | POA: Insufficient documentation

## 2017-11-03 DIAGNOSIS — Z96653 Presence of artificial knee joint, bilateral: Secondary | ICD-10-CM | POA: Insufficient documentation

## 2017-11-03 DIAGNOSIS — M19072 Primary osteoarthritis, left ankle and foot: Secondary | ICD-10-CM | POA: Diagnosis not present

## 2017-11-03 DIAGNOSIS — R4182 Altered mental status, unspecified: Secondary | ICD-10-CM

## 2017-11-03 DIAGNOSIS — Z9884 Bariatric surgery status: Secondary | ICD-10-CM | POA: Diagnosis not present

## 2017-11-03 DIAGNOSIS — Z8719 Personal history of other diseases of the digestive system: Secondary | ICD-10-CM | POA: Diagnosis not present

## 2017-11-03 DIAGNOSIS — G4733 Obstructive sleep apnea (adult) (pediatric): Secondary | ICD-10-CM | POA: Insufficient documentation

## 2017-11-03 DIAGNOSIS — Z79899 Other long term (current) drug therapy: Secondary | ICD-10-CM | POA: Diagnosis not present

## 2017-11-03 DIAGNOSIS — G9341 Metabolic encephalopathy: Principal | ICD-10-CM | POA: Insufficient documentation

## 2017-11-03 DIAGNOSIS — K5730 Diverticulosis of large intestine without perforation or abscess without bleeding: Secondary | ICD-10-CM | POA: Diagnosis not present

## 2017-11-03 DIAGNOSIS — R918 Other nonspecific abnormal finding of lung field: Secondary | ICD-10-CM | POA: Insufficient documentation

## 2017-11-03 DIAGNOSIS — Z6832 Body mass index (BMI) 32.0-32.9, adult: Secondary | ICD-10-CM | POA: Diagnosis not present

## 2017-11-03 DIAGNOSIS — M479 Spondylosis, unspecified: Secondary | ICD-10-CM | POA: Diagnosis not present

## 2017-11-03 DIAGNOSIS — S92312A Displaced fracture of first metatarsal bone, left foot, initial encounter for closed fracture: Secondary | ICD-10-CM | POA: Diagnosis not present

## 2017-11-03 DIAGNOSIS — R509 Fever, unspecified: Secondary | ICD-10-CM

## 2017-11-03 DIAGNOSIS — R410 Disorientation, unspecified: Secondary | ICD-10-CM | POA: Diagnosis not present

## 2017-11-03 LAB — CBC
HCT: 42.3 % (ref 40.0–52.0)
Hemoglobin: 14.2 g/dL (ref 13.0–18.0)
MCH: 30.4 pg (ref 26.0–34.0)
MCHC: 33.5 g/dL (ref 32.0–36.0)
MCV: 90.6 fL (ref 80.0–100.0)
PLATELETS: 289 10*3/uL (ref 150–440)
RBC: 4.67 MIL/uL (ref 4.40–5.90)
RDW: 14.9 % — ABNORMAL HIGH (ref 11.5–14.5)
WBC: 6.5 10*3/uL (ref 3.8–10.6)

## 2017-11-03 LAB — COMPREHENSIVE METABOLIC PANEL
ALK PHOS: 82 U/L (ref 38–126)
ALT: 18 U/L (ref 17–63)
AST: 34 U/L (ref 15–41)
Albumin: 4.2 g/dL (ref 3.5–5.0)
Anion gap: 11 (ref 5–15)
BUN: 12 mg/dL (ref 6–20)
CO2: 24 mmol/L (ref 22–32)
CREATININE: 0.87 mg/dL (ref 0.61–1.24)
Calcium: 9 mg/dL (ref 8.9–10.3)
Chloride: 106 mmol/L (ref 101–111)
GFR calc non Af Amer: >60 mL/min (ref >60)
Glucose, Bld: 146 mg/dL — ABNORMAL HIGH (ref 65–99)
Potassium: 3.7 mmol/L (ref 3.5–5.1)
Sodium: 141 mmol/L (ref 135–145)
Total Bilirubin: 0.6 mg/dL (ref 0.3–1.2)
Total Protein: 7.2 g/dL (ref 6.5–8.1)

## 2017-11-03 LAB — GLUCOSE, CAPILLARY: Glucose-Capillary: 138 mg/dL — ABNORMAL HIGH (ref 65–99)

## 2017-11-03 MED ORDER — LORAZEPAM 2 MG/ML IJ SOLN
INTRAMUSCULAR | Status: AC
  Filled 2017-11-03: qty 1

## 2017-11-03 MED ORDER — LORAZEPAM 2 MG/ML IJ SOLN
2.0000 mg | Freq: Once | INTRAMUSCULAR | Status: AC
  Administered 2017-11-03: 2 mg via INTRAVENOUS

## 2017-11-03 MED ORDER — IOPAMIDOL (ISOVUE-370) INJECTION 76%
75.0000 mL | Freq: Once | INTRAVENOUS | Status: AC | PRN
  Administered 2017-11-03: 75 mL via INTRAVENOUS

## 2017-11-03 NOTE — ED Notes (Addendum)
Report received from [facility]
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
## 2017-11-03 NOTE — ED Triage Notes (Signed)
Pt brought in via ems from home.  On arrival to treatment room, pt yelling out.  Pt unable to answer any questions.  Last known well was last night.  md at bedside.  Pt confused.

## 2017-11-04 ENCOUNTER — Observation Stay: Payer: 59

## 2017-11-04 ENCOUNTER — Other Ambulatory Visit: Payer: Self-pay

## 2017-11-04 DIAGNOSIS — R4182 Altered mental status, unspecified: Secondary | ICD-10-CM

## 2017-11-04 DIAGNOSIS — L97521 Non-pressure chronic ulcer of other part of left foot limited to breakdown of skin: Secondary | ICD-10-CM | POA: Diagnosis not present

## 2017-11-04 DIAGNOSIS — R410 Disorientation, unspecified: Secondary | ICD-10-CM | POA: Diagnosis present

## 2017-11-04 DIAGNOSIS — N400 Enlarged prostate without lower urinary tract symptoms: Secondary | ICD-10-CM | POA: Diagnosis not present

## 2017-11-04 DIAGNOSIS — R918 Other nonspecific abnormal finding of lung field: Secondary | ICD-10-CM | POA: Diagnosis not present

## 2017-11-04 LAB — URINALYSIS, COMPLETE (UACMP) WITH MICROSCOPIC
Bilirubin Urine: NEGATIVE
GLUCOSE, UA: NEGATIVE mg/dL
Ketones, ur: 20 mg/dL — AB
Leukocytes, UA: NEGATIVE
Nitrite: NEGATIVE
Protein, ur: NEGATIVE mg/dL
SPECIFIC GRAVITY, URINE: 1.021 (ref 1.005–1.030)
pH: 6 (ref 5.0–8.0)

## 2017-11-04 LAB — URINE DRUG SCREEN, QUALITATIVE (ARMC ONLY)
AMPHETAMINES, UR SCREEN: NOT DETECTED
Barbiturates, Ur Screen: NOT DETECTED
Benzodiazepine, Ur Scrn: POSITIVE — AB
Cannabinoid 50 Ng, Ur ~~LOC~~: POSITIVE — AB
Cocaine Metabolite,Ur ~~LOC~~: NOT DETECTED
MDMA (ECSTASY) UR SCREEN: NOT DETECTED
Methadone Scn, Ur: NOT DETECTED
Opiate, Ur Screen: NOT DETECTED
PHENCYCLIDINE (PCP) UR S: NOT DETECTED
TRICYCLIC, UR SCREEN: NOT DETECTED

## 2017-11-04 LAB — C-REACTIVE PROTEIN: CRP: 3.8 mg/dL — AB (ref <1.0)

## 2017-11-04 LAB — AMMONIA: Ammonia: 15 umol/L (ref 9–35)

## 2017-11-04 LAB — LACTIC ACID, PLASMA
Lactic Acid, Venous: 1.3 mmol/L (ref 0.5–1.9)
Lactic Acid, Venous: 1.5 mmol/L (ref 0.5–1.9)

## 2017-11-04 LAB — TSH: TSH: 0.917 u[IU]/mL (ref 0.350–4.500)

## 2017-11-04 LAB — SEDIMENTATION RATE: SED RATE: 13 mm/h (ref 0–20)

## 2017-11-04 MED ORDER — ALPRAZOLAM 1 MG PO TABS
1.0000 mg | ORAL_TABLET | Freq: Three times a day (TID) | ORAL | Status: DC | PRN
  Administered 2017-11-04: 1 mg via ORAL
  Filled 2017-11-04: qty 1

## 2017-11-04 MED ORDER — DEXTROSE 5 % IV SOLN
10.0000 mg/kg | Freq: Three times a day (TID) | INTRAVENOUS | Status: DC
  Administered 2017-11-04 – 2017-11-05 (×3): 955 mg via INTRAVENOUS
  Filled 2017-11-04 (×5): qty 19.1

## 2017-11-04 MED ORDER — SODIUM CHLORIDE 0.9 % IV SOLN
2.0000 g | Freq: Once | INTRAVENOUS | Status: AC
  Administered 2017-11-04: 2 g via INTRAVENOUS
  Filled 2017-11-04: qty 2

## 2017-11-04 MED ORDER — DEXAMETHASONE SODIUM PHOSPHATE 10 MG/ML IJ SOLN
10.0000 mg | Freq: Once | INTRAMUSCULAR | Status: AC
  Administered 2017-11-04: 10 mg via INTRAVENOUS
  Filled 2017-11-04: qty 1

## 2017-11-04 MED ORDER — DEXTROSE 5 % IV SOLN
10.0000 mg/kg | Freq: Once | INTRAVENOUS | Status: AC
  Administered 2017-11-04: 980 mg via INTRAVENOUS
  Filled 2017-11-04: qty 19.6

## 2017-11-04 MED ORDER — ONDANSETRON HCL 4 MG/2ML IJ SOLN: 4.0000 mg | Freq: Four times a day (QID) | INTRAMUSCULAR | Status: DC | PRN

## 2017-11-04 MED ORDER — DIVALPROEX SODIUM 500 MG PO DR TAB
500.0000 mg | DELAYED_RELEASE_TABLET | Freq: Two times a day (BID) | ORAL | Status: DC
  Administered 2017-11-05: 500 mg via ORAL
  Filled 2017-11-04 (×2): qty 1

## 2017-11-04 MED ORDER — ALBUTEROL SULFATE (2.5 MG/3ML) 0.083% IN NEBU: 2.5000 mg | INHALATION_SOLUTION | RESPIRATORY_TRACT | Status: DC | PRN

## 2017-11-04 MED ORDER — ASPIRIN EC 81 MG PO TBEC
81.0000 mg | DELAYED_RELEASE_TABLET | Freq: Every day | ORAL | Status: DC
  Administered 2017-11-04 – 2017-11-05 (×2): 81 mg via ORAL
  Filled 2017-11-04 (×2): qty 1

## 2017-11-04 MED ORDER — ENOXAPARIN SODIUM 40 MG/0.4ML ~~LOC~~ SOLN
40.0000 mg | SUBCUTANEOUS | Status: DC
  Administered 2017-11-04 – 2017-11-05 (×2): 40 mg via SUBCUTANEOUS
  Filled 2017-11-04 (×2): qty 0.4

## 2017-11-04 MED ORDER — VALPROATE SODIUM 500 MG/5ML IV SOLN
1000.0000 mg | Freq: Once | INTRAVENOUS | Status: AC
  Administered 2017-11-04: 1000 mg via INTRAVENOUS
  Filled 2017-11-04: qty 10

## 2017-11-04 MED ORDER — VANCOMYCIN HCL 10 G IV SOLR
2000.0000 mg | Freq: Once | INTRAVENOUS | Status: AC
  Administered 2017-11-04: 2000 mg via INTRAVENOUS
  Filled 2017-11-04: qty 2000

## 2017-11-04 MED ORDER — DEXTROSE 5 % IV SOLN: 10.0000 mg/kg | Freq: Three times a day (TID) | INTRAVENOUS | Status: DC

## 2017-11-04 MED ORDER — ACETAMINOPHEN 650 MG RE SUPP: 650.0000 mg | Freq: Four times a day (QID) | RECTAL | Status: DC | PRN

## 2017-11-04 MED ORDER — DOCUSATE SODIUM 100 MG PO CAPS
100.0000 mg | ORAL_CAPSULE | Freq: Two times a day (BID) | ORAL | Status: DC
  Administered 2017-11-04 – 2017-11-05 (×2): 100 mg via ORAL
  Filled 2017-11-04 (×3): qty 1

## 2017-11-04 MED ORDER — ACETAMINOPHEN 325 MG PO TABS
650.0000 mg | ORAL_TABLET | Freq: Four times a day (QID) | ORAL | Status: DC | PRN
  Administered 2017-11-04: 650 mg via ORAL
  Filled 2017-11-04 (×2): qty 2

## 2017-11-04 MED ORDER — ONDANSETRON HCL 4 MG PO TABS: 4.0000 mg | ORAL_TABLET | Freq: Four times a day (QID) | ORAL | Status: DC | PRN
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
## 2017-11-04 NOTE — Procedures (Addendum)
ELECTROENCEPHALOGRAM REPORT   Patient: [name]       Room #: 159A-AA EEG No. ID: [id number] Age: 57 y.o.        Sex: male Referring Physician: Mody Report Date:  11/04/2017        Interpreting Physician: Alexis Goodell  History: [name] is an 57 y.o. male with altered mental status  Medications:  Rocephin, Acyclovir, Vancomycin  Conditions of Recording:  This is a 16 channel EEG carried out with the patient in the awake but confused state.  Description:  The waking background activity is discontinous.  It consists of a low voltage, symmetrical, fairly well organized, 6-7 Hz theta activity, seen from the parieto-occipital and posterior temporal regions.  Low voltage fast activity, poorly organized, is seen anteriorly and is at times superimposed on more posterior regions.  A mixture of theta and alpha rhythms, but mostly theta thythms are seen from the central and temporal regions.  This activity dominates the beginning of the recording with intermittent periods of increased voltage, poorly organized polymorphic delta activity that is generalized.  This slow activity lasts from 5-80 seconds before the aforementioned rhythm returns.  During these periods of slowing are noted frequent, superimposed, high voltage, periodic transients of triphasic morphology.  Again, these are generalized as well.  Towards the end of the recording there is very little of the theta activity noted an the background rhythm is dominated by the polymorphic delta activity and the frequent, generalized triphasic activity.      Hyperventilation was not performed.  Intermittent photic stimulation was performed but failed to illicit any change in the tracing.   IMPRESSION: This is an abnormal electroencephalogram secondary to periodic slowing with frequent superimposed triphasic activity.  Although this is most consistent with an encephalopathy, due to the intermittent nature can not rule out subclinical seizure activity.   Clinical correlation recommended.     Alexis Goodell, MD Neurology [phone] 11/04/2017, 2:15 PM

## 2017-11-04 NOTE — Progress Notes (Signed)
Received pt from er via bed in room 138. Pt very restless and agitated and unable to be redirected. Pt flopping back and forth in bed without being cognizant of medical devices attached to him or his safety. Niece Harmon Pier briefly visited at bedside then left. Pt repeatedly saying "oh man",  'oh god", and he will not answer any questions. Unable to complete admit info. Lt big toe is beefy red; lt foot, ankle and lower leg are 2-3 + non pitting edematous. Dried blood is noted on feet but no source identified. Sitter requested from MD.

## 2017-11-04 NOTE — Progress Notes (Signed)
Spoke to Elizabeth, niece, said that pt is missing approx 45 pills of gabapentin 300mg  caps. Also taking seroquel and xanax per wife.

## 2017-11-04 NOTE — Progress Notes (Signed)
Patient admitted this morning for altered mental status.  I have placed orders for MRI, EEG, neurology consultation.  Agree with the rest of the admitting MD plan

## 2017-11-04 NOTE — Progress Notes (Signed)
eeg completed ° °

## 2017-11-04 NOTE — Progress Notes (Signed)
Pharmacy Antibiotic Note  [name] is a 57 y.o. 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  Pharmacy has been consulted for acyclovir dosing.  Plan: Acyclovir 10 mg/kg/dose IV q8h.   Height: 6\' 2"  (188 cm) Weight: 253 lb 8.5 oz (115 kg) IBW/kg (Calculated) : 82.2  Temp (24hrs), Avg:99 F (37.2 C), Min:98.5 F (36.9 C), Max:99.6 F (37.6 C)  Recent Labs  Lab 11/03/17 2221 11/04/17 0111 11/04/17 0801  WBC 6.5  --   --   CREATININE 0.87  --   --   LATICACIDVEN  --  1.3 1.5    Estimated Creatinine Clearance: 124.8 mL/min (by C-G formula based on SCr of 0.87 mg/dL).    Allergies  Allergen Reactions  . Sulfa Antibiotics Itching and Rash    More severe reaction 3 years ago, caused pain.  Had taken it prior and not as bad.    Antimicrobials this admission: Acyclovir 6/5>>  Dose adjustments this admission:   Microbiology results:   Thank you for allowing pharmacy to be a part of this patient's care.  Rocky Morel 11/04/2017 3:11 PM
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
## 2017-11-04 NOTE — Progress Notes (Signed)
Awake in bed and cooperative with care. Sitter at bedside. Asking for hydrocodone and voltaren. Explained he does not have wither one currently ordered and he can ask MD about then in am when MD rounds. Medicated per mar. Call bell in reach.

## 2017-11-04 NOTE — Progress Notes (Signed)
Attempted to give pt tylenol, but pt spit out pills, several requests were made to pt to take the tylenol, to promote comfort, but pt unable to follow these directions. Pt has been able to respond with a "yes" when his name is called and follow simple directions with prompting. Sitter at bedside. Also of note, tele monitor has shown heart rate from 40 to 104, all sinus.

## 2017-11-04 NOTE — Progress Notes (Signed)
Lactic acid order draw for ~0230 was not drawn due to label situation. Lactic reordered for now.

## 2017-11-04 NOTE — Progress Notes (Signed)
Sitter arrived to room. Pt was able to indicate he had to void. 100cc done in urinal with assist

## 2017-11-05 DIAGNOSIS — G9340 Encephalopathy, unspecified: Secondary | ICD-10-CM | POA: Diagnosis not present

## 2017-11-05 LAB — VALPROIC ACID LEVEL: Valproic Acid Lvl: 17 ug/mL — ABNORMAL LOW (ref 50.0–100.0)

## 2017-11-05 MED ORDER — QUETIAPINE FUMARATE 300 MG PO TABS: 300.0000 mg | ORAL_TABLET | Freq: Every day | ORAL | 0 refills | Status: DC

## 2017-11-05 NOTE — Progress Notes (Signed)
Sitter at bedside. Pt resting with eyes closed, arouses easily to name.

## 2017-11-05 NOTE — Progress Notes (Signed)
Pt awake in bed watching tv. Condom cath placed on pt for very frequent urination. Sitter at bedside.

## 2017-11-05 NOTE — Discharge Planning (Signed)
IV x4 and tele removed. RN assessment and VS revealed stability for DC to home.  Discharge papers given, explained and educated.  Informed of suggested FU appt and appt made.  Script printed and giving. Once ready will be wheeled to front and family transporting home via car.

## 2017-11-05 NOTE — Discharge Summary (Signed)
[facility] NAME: [name]    MR#:  [id number]  DATE OF BIRTH:  September 17, 1960  DATE OF ADMISSION:  11/03/2017 ADMITTING PHYSICIAN: Harrie Foreman, MD  DATE OF DISCHARGE: November 05, 2017   PRIMARY CARE PHYSICIAN: McLean-Scocuzza, Nino Glow, MD    ADMISSION DIAGNOSIS:  Fever, unspecified fever cause [R50.9] Altered mental status, unspecified altered mental status type [R41.82] Displaced fracture of first metatarsal bone, left foot, initial encounter for closed fracture [S92.312A]  DISCHARGE DIAGNOSIS:  Active Problems:   Confusion   SECONDARY DIAGNOSIS:   Past Medical History:  Diagnosis Date  . Abnormal finding on GI tract imaging    2006 mesenteritis   . Adrenal adenoma, right    noted CT 06/2015 PET 06/2015 benign   . Anxiety   . Arthritis    "knees, some in my ankles; back" (09/19/2015)  . Bipolar affective ([place])    takes Seroquel nightly  . BPH (benign prostatic hyperplasia)   . Colitis 11/19/2016   On colonoscopy 02/21/2014, biopsy - benign w/acute inflammation and minimal crypt distortion, unclear etiology  . Corneal erosion of left eye 06/12/2017  . DDD (degenerative disc disease), lumbar   . Depression   . Diverticulosis of colon without diverticulitis 11/19/2016  . Family history of prostate cancer 11/11/2016   Father and brother  . GERD (gastroesophageal reflux disease)   . H/O multiple pulmonary nodules    biopsies negative  . Hidradenitis   . Iron deficiency anemia   . Joint pain   . Kidney stones    "passed"  . Morbid obesity ([place])   . Muscle spasm of both lower legs    takes Baclofen daily as needed;notices in hands as well  . OSA (obstructive sleep apnea)    does not use CPAP; "mostly cured w/gastric bypass; borderline result in 2013 sleep study" (09/19/2015)  . Pneumonia 06/2015   put on Prednisone every other day  . 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 Urinary frequency   . Urinary urgency      HOSPITAL COURSE:   57 year old male with a history of depression who presented with altered mental status. 1.  Acute metabolic encephalopathy: Etiology of encephalopathy is due to overmedication.  Patient reports that he took too many Seroquel and gabapentin and this was unintentional.  He had an neurology consultation as well as EEG and MRI.  MRI of the brain showed no acute pathology.  EEG showed signs of encephalopathy.  He is back to his baseline.  2.  Depression: Patient will continue on lower dose of Seroquel and Cymbalta.  He will follow-up with his psychiatrist.  3.  Chronic back pain: Patient will be discharged on baclofen.  He does not want to take gabapentin anymore.   DISCHARGE CONDITIONS AND DIET:   Stable for discharge on regular diet  CONSULTS OBTAINED:  Treatment Team:  Catarina Hartshorn, MD  DRUG ALLERGIES:   Allergies  Allergen Reactions  . Sulfa Antibiotics Itching and Rash    More severe reaction 3 years ago, caused pain.  Had taken it prior and not as bad.    DISCHARGE MEDICATIONS:   Allergies as of 11/05/2017      Reactions   Sulfa Antibiotics Itching, Rash   More severe reaction 3 years ago, caused pain.  Had taken it prior and not as bad.      Medication List    STOP taking these medications   gabapentin  300 MG capsule Commonly known as:  NEURONTIN   methocarbamol 500 MG tablet Commonly known as:  ROBAXIN   traMADol 50 MG tablet Commonly known as:  ULTRAM     TAKE these medications   acetaminophen 500 MG tablet Commonly known as:  TYLENOL Take 1,000 mg by mouth every 8 (eight) hours as needed for mild pain or moderate pain.   albuterol 108 (90 Base) MCG/ACT inhaler Commonly known as:  PROVENTIL HFA;VENTOLIN HFA Inhale 1-2 puffs into the lungs every 6 (six) hours as needed for wheezing or shortness of breath.   ALPRAZolam 1 MG tablet Commonly known as:  XANAX Take 1 mg by mouth 3 (three) times daily as needed for anxiety. Taking 0.5  to 3 mg qd per psychiatry   baclofen 10 MG tablet Commonly known as:  LIORESAL Take 10 mg by mouth 3 (three) times daily.   diclofenac 75 MG EC tablet Commonly known as:  VOLTAREN Take 1 tablet (75 mg total) by mouth 2 (two) times daily as needed.   DULoxetine 60 MG capsule Commonly known as:  CYMBALTA Take 120 mg by mouth daily.   HYDROcodone-acetaminophen 5-325 MG tablet Commonly known as:  NORCO/VICODIN Take 1 tablet by mouth 2 (two) times daily.   QUEtiapine 300 MG tablet Commonly known as:  SEROQUEL Take 1 tablet (300 mg total) by mouth at bedtime. What changed:  how much to take         Today   CHIEF COMPLAINT:   Patient is back to his baseline.  No acute issues overnight.   VITAL SIGNS:  Blood pressure (!) 147/93, pulse 83, temperature 98.3 F (36.8 C), temperature source Oral, resp. rate 18, height 6\' 2"  (1.88 m), weight 115.2 kg (254 lb), SpO2 99 %.   REVIEW OF SYSTEMS:  Review of Systems  Constitutional: Negative.  Negative for chills, fever and malaise/fatigue.  HENT: Negative.  Negative for ear discharge, ear pain, hearing loss, nosebleeds and sore throat.   Eyes: Negative.  Negative for blurred vision and pain.  Respiratory: Negative.  Negative for cough, hemoptysis, shortness of breath and wheezing.   Cardiovascular: Negative.  Negative for chest pain, palpitations and leg swelling.  Gastrointestinal: Negative.  Negative for abdominal pain, blood in stool, diarrhea, nausea and vomiting.  Genitourinary: Negative.  Negative for dysuria.  Musculoskeletal: Negative.  Negative for back pain.  Skin: Negative.   Neurological: Negative for dizziness, tremors, speech change, focal weakness, seizures and headaches.  Endo/Heme/Allergies: Negative.  Does not bruise/bleed easily.  Psychiatric/Behavioral: Negative.  Negative for depression, hallucinations and suicidal ideas.     PHYSICAL EXAMINATION:  GENERAL:  57 y.o.-year-old patient lying in the bed with  no acute distress.  NECK:  Supple, no jugular venous distention. No thyroid enlargement, no tenderness.  LUNGS: Normal breath sounds bilaterally, no wheezing, rales,rhonchi  No use of accessory muscles of respiration.  CARDIOVASCULAR: S1, S2 normal. No murmurs, rubs, or gallops.  ABDOMEN: Soft, non-tender, non-distended. Bowel sounds present. No organomegaly or mass.  EXTREMITIES: No pedal edema, cyanosis, or clubbing.  PSYCHIATRIC: The patient is alert and oriented x 3.  SKIN: No obvious rash, lesion, or ulcer.   DATA REVIEW:   CBC Recent Labs  Lab 11/03/17 2221  WBC 6.5  HGB 14.2  HCT 42.3  PLT 289    Chemistries  Recent Labs  Lab 11/03/17 2221  NA 141  K 3.7  CL 106  CO2 24  GLUCOSE 146*  BUN 12  CREATININE 0.87  CALCIUM 9.0  AST 34  ALT 18  ALKPHOS 82  BILITOT 0.6    Cardiac Enzymes No results for input(s): TROPONINI in the last 168 hours.  Microbiology Results  @[email or web address] @  RADIOLOGY:  Ct Abdomen Pelvis Wo Contrast  Result Date: 11/03/2017 CLINICAL DATA:  Aortic disease. EXAM: CT ABDOMEN AND PELVIS WITHOUT CONTRAST TECHNIQUE: Multidetector CT imaging of the abdomen and pelvis was performed following the standard protocol without IV contrast. COMPARISON:  PET-CT dated June 11, 2015. FINDINGS: Lower chest: No acute abnormality. Hepatobiliary: No focal liver abnormality is seen. No gallstones, gallbladder wall thickening, or biliary dilatation. Pancreas: Unremarkable. No pancreatic ductal dilatation or surrounding inflammatory changes. Spleen: Normal in size without focal abnormality. Adrenals/Urinary Tract: Unchanged right adrenal adenoma. The left adrenal gland is unremarkable. The kidneys are normal without focal lesion, renal calculi, or hydronephrosis. The bladder is decompressed. Stomach/Bowel: Prior Roux-en-Y gastric bypass surgery. Appendix is normal. No bowel wall thickening, distention, or surrounding inflammatory changes. Mild colonic diverticulosis.  Vascular/Lymphatic: No significant vascular findings are present. No enlarged abdominal or pelvic lymph nodes. Reproductive: Central prostatic calcifications. Other: No free fluid or pneumoperitoneum. Prior left inguinal hernia repair. Musculoskeletal: No acute or significant osseous findings. Severe degenerative disc disease at L1-L2. IMPRESSION: 1.  No acute intra-abdominal process. 2. Unchanged right adrenal adenoma. Electronically Signed   By: Titus Dubin M.D.   On: 11/03/2017 23:41   Ct Head Wo Contrast  Result Date: 11/03/2017 CLINICAL DATA:  Confusion. Cerebral aneurysm, SAH, cerebral vasospasm eval EXAM: CT HEAD WITHOUT CONTRAST TECHNIQUE: Contiguous axial images were obtained from the base of the skull through the vertex without intravenous contrast. COMPARISON:  None. FINDINGS: Despite 3 image acquisitions, motion artifact persists. Brain: No intracranial hemorrhage, mass effect, or midline shift. No hydrocephalus. The basilar cisterns are patent. No evidence of territorial infarct or acute ischemia. No extra-axial or intracranial fluid collection. Vascular: No hyperdense vessel. Skull: No skull fracture or evidence of focal lesion. Sinuses/Orbits: No acute findings. Other: None. IMPRESSION: No acute intracranial abnormality, allowing for motion artifact. Electronically Signed   By: Jeb Levering M.D.   On: 11/03/2017 23:39   Mr Brain Wo Contrast  Result Date: 11/04/2017 CLINICAL DATA:  Initial evaluation for acute altered mental status. EXAM: MRI HEAD WITHOUT CONTRAST TECHNIQUE: Multiplanar, multiecho pulse sequences of the brain and surrounding structures were obtained without intravenous contrast. COMPARISON:  Prior CT from 11/03/2017. FINDINGS: Brain: 5 cerebral volume within normal limits for patient age. No significant cerebral white matter changes identified. Small foci of cortical encephalomalacia involving the anterior inferior frontal lobes bilaterally, most like related to remote  traumatic injury. No evidence for acute or subacute ischemia. Gray-white matter differentiation maintained. No evidence for chronic infarction. No acute or chronic intracranial hemorrhage. No mass lesion, midline shift or mass effect. No hydrocephalus. No extra-axial fluid collection. Pituitary gland and suprasellar region within normal limits. Vascular: Major intracranial vascular flow voids maintained. Skull and upper cervical spine: Craniocervical junction normal. Upper cervical spine within normal limits. Bone marrow signal intensity normal. No scalp soft tissue abnormality. Sinuses/Orbits: Globes and orbital soft tissues within normal limits. Right maxillary sinus hypoplastic in appearance. Small left maxillary sinus retention cyst. Mild scattered mucosal thickening within the ethmoidal air cells. No mastoid effusion. Inner ear structures normal. Other: None. IMPRESSION: 1. No acute intracranial abnormality. 2. Small foci of bifrontal encephalomalacia, most likely related to remote traumatic injury. 3. Otherwise normal brain MRI for age. Electronically Signed   By: Jeannine Boga M.D.   On: 11/04/2017 14:33  Dg Chest Portable 1 View  Result Date: 11/04/2017 CLINICAL DATA:  Acute onset of altered mental status and generalized weakness. EXAM: PORTABLE CHEST 1 VIEW COMPARISON:  Chest radiograph performed 07/07/2017, and CT of the chest performed 09/30/2017 FINDINGS: The lungs are hypoexpanded. Bibasilar airspace opacities likely reflect atelectasis and scarring. No pleural effusion or pneumothorax is seen. The cardiomediastinal silhouette is normal in size. No acute osseous abnormalities are identified. IMPRESSION: Lungs hypoexpanded. Bibasilar airspace opacities likely reflect atelectasis and scarring. Electronically Signed   By: Garald Balding M.D.   On: 11/04/2017 00:35   Dg Foot 2 Views Left  Result Date: 11/04/2017 CLINICAL DATA:  Acute onset of left foot soreness and erythema. Ulceration. EXAM:  LEFT FOOT - 2 VIEW COMPARISON:  Left foot radiographs performed 11/10/2013 FINDINGS: There appears to be a minimally displaced acute fracture through the first proximal phalanx. Postoperative change at the first and second metatarsals is grossly unchanged in appearance. No osseous erosions are seen. Degenerative change is noted about the midfoot. 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 Mild soft tissue swelling is noted about the forefoot. Hardware is noted along the distal tibia. The known soft tissue ulceration is not well characterized on radiograph. No radiopaque foreign bodies are seen. IMPRESSION: 1. Minimally displaced acute fracture through the first proximal phalanx. No osseous erosions seen. 2. 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 3. Known soft tissue ulceration is not well characterized. No radiopaque foreign bodies seen. Electronically Signed   By: Garald Balding M.D.   On: 11/04/2017 00:38      Allergies as of 11/05/2017      Reactions   Sulfa Antibiotics Itching, Rash   More severe reaction 3 years ago, caused pain.  Had taken it prior and not as bad.      Medication List    STOP taking these medications   gabapentin 300 MG capsule Commonly known as:  NEURONTIN   methocarbamol 500 MG tablet Commonly known as:  ROBAXIN   traMADol 50 MG tablet Commonly known as:  ULTRAM     TAKE these medications   acetaminophen 500 MG tablet Commonly known as:  TYLENOL Take 1,000 mg by mouth every 8 (eight) hours as needed for mild pain or moderate pain.   albuterol 108 (90 Base) MCG/ACT inhaler Commonly known as:  PROVENTIL HFA;VENTOLIN HFA Inhale 1-2 puffs into the lungs every 6 (six) hours as needed for wheezing or shortness of breath.   ALPRAZolam 1 MG tablet Commonly known as:   XANAX Take 1 mg by mouth 3 (three) times daily as needed for anxiety. Taking 0.5 to 3 mg qd per psychiatry   baclofen 10 MG tablet Commonly known as:  LIORESAL Take 10 mg by mouth 3 (three) times daily.   diclofenac 75 MG EC tablet Commonly known as:  VOLTAREN Take 1 tablet (75 mg total) by mouth 2 (two) times daily as needed.   DULoxetine 60 MG capsule Commonly known as:  CYMBALTA Take 120 mg by mouth daily.   HYDROcodone-acetaminophen 5-325 MG tablet Commonly known as:  NORCO/VICODIN Take 1 tablet by mouth 2 (two) times daily.   QUEtiapine 300 MG tablet Commonly known as:  SEROQUEL Take 1 tablet (300 mg total) by mouth at bedtime. What changed:  how much to take          Management  plans discussed with the patient and he is in agreement. Stable for discharge home  Patient should follow up with pcp  CODE STATUS:     Code Status Orders  (From admission, onward)        Start     Ordered   11/04/17 0307  Full code  Continuous     11/04/17 0306    Code Status History    Date Active Date Inactive Code Status Order ID Comments User Context   08/11/2017 1938 08/16/2017 1523 Full Code [id number]  Saundra Shelling, MD Inpatient   12/26/2015 0300 12/28/2015 2122 Full Code [id number]  Etta Quill, DO Inpatient      TOTAL TIME TAKING CARE OF THIS PATIENT: 38 minutes.    Note: This dictation was prepared with Dragon dictation along with smaller phrase technology. Any transcriptional errors that result from this process are unintentional.  Margerite Impastato M.D on 11/05/2017 at 8:36 AM  Between 7am to 6pm - Pager - [phone] After 6pm go to [email or web address] - password [email or web address] [name]  Office  [phone]  CC: Primary care physician; McLean-Scocuzza, Nino Glow, MD

## 2017-11-07 ENCOUNTER — Emergency Department
Admission: EM | Admit: 2017-11-07 | Discharge: 2017-11-08 | Disposition: A | Discharge status: Home / Self Care | Payer: 59 | Attending: Emergency Medicine | Admitting: Emergency Medicine

## 2017-11-07 ENCOUNTER — Encounter: Payer: Self-pay | Admitting: Emergency Medicine

## 2017-11-07 ENCOUNTER — Other Ambulatory Visit: Payer: Self-pay

## 2017-11-07 ENCOUNTER — Emergency Department: Payer: 59

## 2017-11-07 DIAGNOSIS — Z79899 Other long term (current) drug therapy: Secondary | ICD-10-CM | POA: Insufficient documentation

## 2017-11-07 DIAGNOSIS — F17210 Nicotine dependence, cigarettes, uncomplicated: Secondary | ICD-10-CM | POA: Insufficient documentation

## 2017-11-07 DIAGNOSIS — I82412 Acute embolism and thrombosis of left femoral vein: Secondary | ICD-10-CM | POA: Diagnosis not present

## 2017-11-07 DIAGNOSIS — L03116 Cellulitis of left lower limb: Secondary | ICD-10-CM | POA: Diagnosis not present

## 2017-11-07 DIAGNOSIS — S82302D Unspecified fracture of lower end of left tibia, subsequent encounter for closed fracture with routine healing: Secondary | ICD-10-CM | POA: Diagnosis not present

## 2017-11-07 DIAGNOSIS — D1430 Benign neoplasm of unspecified bronchus and lung: Secondary | ICD-10-CM | POA: Insufficient documentation

## 2017-11-07 DIAGNOSIS — L03115 Cellulitis of right lower limb: Secondary | ICD-10-CM

## 2017-11-07 DIAGNOSIS — D3501 Benign neoplasm of right adrenal gland: Secondary | ICD-10-CM | POA: Insufficient documentation

## 2017-11-07 DIAGNOSIS — Z96653 Presence of artificial knee joint, bilateral: Secondary | ICD-10-CM | POA: Insufficient documentation

## 2017-11-07 DIAGNOSIS — I82411 Acute embolism and thrombosis of right femoral vein: Secondary | ICD-10-CM

## 2017-11-07 DIAGNOSIS — M79662 Pain in left lower leg: Secondary | ICD-10-CM | POA: Diagnosis present

## 2017-11-07 DIAGNOSIS — S92415A Nondisplaced fracture of proximal phalanx of left great toe, initial encounter for closed fracture: Secondary | ICD-10-CM | POA: Diagnosis not present

## 2017-11-07 LAB — CBC WITH DIFFERENTIAL/PLATELET
Basophils Absolute: 0.1 10*3/uL (ref 0–0.1)
Basophils Relative: 1 %
EOS PCT: 4 %
Eosinophils Absolute: 0.2 10*3/uL (ref 0–0.7)
HCT: 39.5 % — ABNORMAL LOW (ref 40.0–52.0)
Hemoglobin: 13.3 g/dL (ref 13.0–18.0)
LYMPHS PCT: 21 %
Lymphs Abs: 1.1 10*3/uL (ref 1.0–3.6)
MCH: 30.4 pg (ref 26.0–34.0)
MCHC: 33.7 g/dL (ref 32.0–36.0)
MCV: 90.1 fL (ref 80.0–100.0)
MONO ABS: 0.8 10*3/uL (ref 0.2–1.0)
Monocytes Relative: 15 %
Neutro Abs: 3.1 10*3/uL (ref 1.4–6.5)
Neutrophils Relative %: 59 %
PLATELETS: 227 10*3/uL (ref 150–440)
RBC: 4.38 MIL/uL — AB (ref 4.40–5.90)
RDW: 15.2 % — AB (ref 11.5–14.5)
WBC: 5.2 10*3/uL (ref 3.8–10.6)

## 2017-11-07 LAB — COMPREHENSIVE METABOLIC PANEL
ALT: 19 U/L (ref 17–63)
AST: 24 U/L (ref 15–41)
Albumin: 3.6 g/dL (ref 3.5–5.0)
Alkaline Phosphatase: 79 U/L (ref 38–126)
Anion gap: 9 (ref 5–15)
BILIRUBIN TOTAL: 0.4 mg/dL (ref 0.3–1.2)
BUN: 17 mg/dL (ref 6–20)
CHLORIDE: 108 mmol/L (ref 101–111)
CO2: 21 mmol/L — ABNORMAL LOW (ref 22–32)
CREATININE: 0.77 mg/dL (ref 0.61–1.24)
Calcium: 8.2 mg/dL — ABNORMAL LOW (ref 8.9–10.3)
GFR calc non Af Amer: >60 mL/min (ref >60)
Glucose, Bld: 127 mg/dL — ABNORMAL HIGH (ref 65–99)
Potassium: 3.8 mmol/L (ref 3.5–5.1)
Sodium: 138 mmol/L (ref 135–145)
TOTAL PROTEIN: 6.4 g/dL — AB (ref 6.5–8.1)

## 2017-11-07 MED ORDER — CEPHALEXIN 500 MG PO CAPS
500.0000 mg | ORAL_CAPSULE | Freq: Once | ORAL | Status: AC
  Administered 2017-11-07: 500 mg via ORAL
  Filled 2017-11-07: qty 1

## 2017-11-07 MED ORDER — RIVAROXABAN (XARELTO) VTE STARTER PACK (15 & 20 MG): ORAL_TABLET | ORAL | 0 refills | Status: DC

## 2017-11-07 MED ORDER — CEPHALEXIN 500 MG PO CAPS: 500.0000 mg | ORAL_CAPSULE | Freq: Three times a day (TID) | ORAL | 0 refills | Status: DC

## 2017-11-07 MED ORDER — DOXYCYCLINE HYCLATE 100 MG PO TABS
100.0000 mg | ORAL_TABLET | Freq: Once | ORAL | Status: AC
  Administered 2017-11-07: 100 mg via ORAL
  Filled 2017-11-07: qty 1

## 2017-11-07 MED ORDER — DOXYCYCLINE HYCLATE 100 MG PO CAPS: 100.0000 mg | ORAL_CAPSULE | Freq: Two times a day (BID) | ORAL | 0 refills | Status: DC

## 2017-11-07 MED ORDER — RIVAROXABAN 15 MG PO TABS
15.0000 mg | ORAL_TABLET | Freq: Once | ORAL | Status: AC
  Administered 2017-11-07: 15 mg via ORAL
  Filled 2017-11-07: qty 1

## 2017-11-07 NOTE — ED Notes (Signed)
MD at bedside. 

## 2017-11-07 NOTE — Discharge Instructions (Addendum)
Your exam today looks like cellulitis which is a skin infection of the leg.  Shows a extensive DVT in the right leg.  Takes Xarelto as a blood thinner to treat the DVT and prevent worsening complications from it.  Take antibiotics as prescribed to treat the skin infection.  Follow-up with primary care and vascular surgery for continued monitoring of your symptoms.  Return to the ED immediately if you develop dizziness, passing out, chest pain, or shortness of breath.

## 2017-11-07 NOTE — ED Notes (Signed)
Pt states that he was admitted for a broken L big toe and injury to the arch of his foot. Was DC 2 days ago. States has been wearing compression stocking on L leg since. States today noticed red rash and swelling to L leg. States increased pain at ankle. States pouch of fluid interior L ankle. States pain across top of L foot at arch. States has been walking on L leg. Denies rash being itchy. Redness goes up about halfway up calf, rash extends to level of redness and becomes more sparse until knee. Alert, oriented, ambulatory.
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
## 2017-11-07 NOTE — ED Triage Notes (Signed)
Per pt he was admitted for dislocated/hyperextended toe. Sees podiatrist. Was on antibiotics while here. Was wearing compression stocking and after walking around had more ankle pain. Has chronic redness and swelling from a plate in his foot. States more red and the "spots" are new.

## 2017-11-07 NOTE — ED Notes (Signed)
Patient transported to Ultrasound 

## 2017-11-09 LAB — CULTURE, BLOOD (ROUTINE X 2)
CULTURE: NO GROWTH
Culture: NO GROWTH
Special Requests: ADEQUATE

## 2017-11-10 ENCOUNTER — Encounter: Payer: Self-pay | Admitting: Internal Medicine

## 2017-11-12 DIAGNOSIS — M5441 Lumbago with sciatica, right side: Secondary | ICD-10-CM | POA: Diagnosis not present

## 2017-11-12 DIAGNOSIS — G8929 Other chronic pain: Secondary | ICD-10-CM | POA: Diagnosis not present

## 2017-11-12 DIAGNOSIS — M48062 Spinal stenosis, lumbar region with neurogenic claudication: Secondary | ICD-10-CM | POA: Diagnosis not present

## 2017-11-13 ENCOUNTER — Ambulatory Visit: Payer: 59 | Admitting: Internal Medicine

## 2017-11-13 ENCOUNTER — Telehealth: Payer: Self-pay | Admitting: Internal Medicine

## 2017-11-13 ENCOUNTER — Encounter: Payer: Self-pay | Admitting: Internal Medicine

## 2017-11-13 VITALS — BP 138/104 | HR 74 | Temp 97.6°F | Resp 18 | Ht 75.0 in | Wt 268.1 lb

## 2017-11-13 DIAGNOSIS — I82402 Acute embolism and thrombosis of unspecified deep veins of left lower extremity: Secondary | ICD-10-CM | POA: Insufficient documentation

## 2017-11-13 DIAGNOSIS — R319 Hematuria, unspecified: Secondary | ICD-10-CM

## 2017-11-13 DIAGNOSIS — R600 Localized edema: Secondary | ICD-10-CM | POA: Diagnosis not present

## 2017-11-13 DIAGNOSIS — M545 Low back pain, unspecified: Secondary | ICD-10-CM

## 2017-11-13 DIAGNOSIS — T148XXA Other injury of unspecified body region, initial encounter: Secondary | ICD-10-CM | POA: Diagnosis not present

## 2017-11-13 DIAGNOSIS — G8929 Other chronic pain: Secondary | ICD-10-CM | POA: Diagnosis not present

## 2017-11-13 DIAGNOSIS — R030 Elevated blood-pressure reading, without diagnosis of hypertension: Secondary | ICD-10-CM | POA: Diagnosis not present

## 2017-11-13 DIAGNOSIS — Z125 Encounter for screening for malignant neoplasm of prostate: Secondary | ICD-10-CM

## 2017-11-13 DIAGNOSIS — M5441 Lumbago with sciatica, right side: Secondary | ICD-10-CM | POA: Diagnosis not present

## 2017-11-13 DIAGNOSIS — I824Y2 Acute embolism and thrombosis of unspecified deep veins of left proximal lower extremity: Secondary | ICD-10-CM

## 2017-11-13 DIAGNOSIS — E559 Vitamin D deficiency, unspecified: Secondary | ICD-10-CM | POA: Diagnosis not present

## 2017-11-13 DIAGNOSIS — E041 Nontoxic single thyroid nodule: Secondary | ICD-10-CM | POA: Diagnosis not present

## 2017-11-13 DIAGNOSIS — Z720 Tobacco use: Secondary | ICD-10-CM | POA: Diagnosis not present

## 2017-11-13 DIAGNOSIS — M549 Dorsalgia, unspecified: Secondary | ICD-10-CM

## 2017-11-13 LAB — VITAMIN D 25 HYDROXY (VIT D DEFICIENCY, FRACTURES): VITD: 30.09 ng/mL (ref 30.00–100.00)

## 2017-11-13 MED ORDER — MUPIROCIN 2 % EX OINT: 1.0000 "application " | TOPICAL_OINTMENT | Freq: Two times a day (BID) | CUTANEOUS | 0 refills | Status: DC

## 2017-11-13 MED ORDER — HYDROCODONE-ACETAMINOPHEN 5-325 MG PO TABS: 1.0000 | ORAL_TABLET | Freq: Three times a day (TID) | ORAL | 0 refills | Status: DC | PRN

## 2017-11-13 MED ORDER — FUROSEMIDE 20 MG PO TABS: 20.0000 mg | ORAL_TABLET | Freq: Every day | ORAL | 3 refills | Status: DC

## 2017-11-13 NOTE — Patient Instructions (Addendum)
Take xarelto 15 mg 2x per day x 21 days and then 20 mg daily  Stay out of work x 2 weeks  Lasix 20 mg in am daily and leg elevation  Follow up in 3 weeks   Deep Vein Thrombosis Deep vein thrombosis (DVT) is a condition in which a blood clot forms in a deep vein, such as a lower leg, thigh, or arm vein. A clot is blood that has thickened into a gel or solid. This condition is dangerous. It can lead to serious and even life-threatening complications if the clot travels to the lungs and causes a blockage (pulmonary embolism). It can also damage veins in the leg. This can result in leg pain, swelling, discoloration, and sores (post-thrombotic syndrome). What are the causes? This condition may be caused by:  A slowdown of blood flow.  Damage to a vein.  A condition that makes blood clot more easily.  What increases the risk? The following factors may make you more likely to develop this condition:  Being overweight.  Being elderly, especially over age 53.  Sitting or lying down for more than four hours.  Lack of physical activity (sedentary lifestyle).  Being pregnant, giving birth, or having recently given birth.  Taking medicines that contain estrogen.  Smoking.  A history of any of the following: ? Blood clots or blood clotting disease. ? Peripheral vascular disease. ? Inflammatory bowel disease. ? Cancer. ? Heart disease. ? Genetic conditions that affect how blood clots. ? Neurological diseases that affect the legs (leg paresis). ? Injury. ? Major or lengthy surgery. ? A central line placed inside a large vein.  What are the signs or symptoms? Symptoms of this condition include:  Swelling, pain, or tenderness in an arm or leg.  Warmth, redness, or discoloration in an arm or leg.  If the clot is in your leg, symptoms may be more noticeable or worse when you stand or walk. Some people do not have any symptoms. How is this diagnosed? This condition is diagnosed  with:  A medical history.  A physical exam.  Tests, such as: ? Blood tests. These are done to see how your blood clots. ? Imaging tests. These are done to check for clots. Tests may include:  Ultrasound.  CT scan.  MRI.  X-ray.  Venogram. For this test, X-rays are taken after a dye is injected into a vein.  How is this treated? Treatment for this condition depends on the cause, your risk for bleeding or developing more clots, and any medical conditions you have. Treatment may include:  Taking blood thinners (also called anticoagulants). These medicines may be taken by mouth, injected under the skin, or injected through an IV tube (catheter). These medicines prevent clots from forming.  Injecting medicine that dissolves blood clots into the affected vein (catheter-directed thrombolysis).  Having surgery. Surgery may be done to: ? Remove the clot. ? Place a filter in a large vein to catch blood clots before they reach the lungs.  Some treatments may be continued for up to six months. Follow these instructions at home: If you are taking an oral blood thinner:  Take the medicine exactly as told by your health care provider. Some blood thinners need to be taken at the same time every day. Do not skip a dose.  Ask your health care provider about what foods and drugs interact with the medicine.  Ask about possible side effects. General instructions  Blood thinners can cause easy bruising and  difficulty stopping bleeding. Because of this, if you are taking or were given a blood thinner: ? Hold pressure over cuts for longer than usual. ? Tell your dentist and other health care providers that you are taking blood thinners before having any procedures that can cause bleeding. ? Avoid contact sports.  Take over-the-counter and prescription medicines only as told by your health care provider.  Return to your normal activities as told by your health care provider. Ask your health  care provider what activities are safe for you.  Wear compression stockings if recommended by your health care provider.  Keep all follow-up visits as told by your health care provider. This is important. How is this prevented? To lower your risk of developing this condition again:  For 30 or more minutes every day, do an activity that: ? Involves moving your arms and legs. ? Increases your heart rate.  When traveling for longer than four hours: ? Exercise your arms and legs every hour. ? Drink plenty of water. ? Avoid drinking alcohol.  Avoid sitting or lying for a long time without moving your legs.  Stay a healthy weight.  If you are a woman who is older than age 29, avoid unnecessary use of medicines that contain estrogen.  Do not use any products that contain nicotine or tobacco, such as cigarettes and e-cigarettes. This is especially important if you take estrogen medicines. If you need help quitting, ask your health care provider.  Contact a health care provider if:  You miss a dose of your blood thinner.  You have nausea, vomiting, or diarrhea that lasts for more than one day.  Your menstrual period is heavier than usual.  You have unusual bruising. Get help right away if:  You have new or increased pain, swelling, or redness in an arm or leg.  You have numbness or tingling in an arm or leg.  You have shortness of breath.  You have chest pain.  You have a rapid or irregular heartbeat.  You feel light-headed or dizzy.  You cough up blood.  There is blood in your vomit, stool, or urine.  You have a serious fall or accident, or you hit your head.  You have a severe headache or confusion.  You have a cut that will not stop bleeding. These symptoms may represent a serious problem that is an emergency. Do not wait to see if the symptoms will go away. Get medical help right away. Call your local emergency services (911 in the [place] Do not drive yourself to  the hospital. Summary  DVT is a condition in which a blood clot forms in a deep vein, such as a lower leg, thigh, or arm vein.  Symptoms can include swelling, warmth, pain, and redness in your leg or arm.  Treatment may include taking blood thinners, injecting medicine that dissolves blood clots,wearing compression stockings, or surgery.  If you are prescribed blood thinners, take them exactly as told. This information is not intended to replace advice given to you by your health care provider. Make sure you discuss any questions you have with your health care provider. Document Released: 05/19/2005 Document Revised: 06/21/2016 Document Reviewed: 06/21/2016 Elsevier Interactive Patient Education  2018 [employer].

## 2017-11-13 NOTE — Telephone Encounter (Signed)
He should not be taking tramadol with hydrocodone  inform patient and pharmacy   Hold tramadol if taking hydrocodone   TMS

## 2017-11-13 NOTE — Telephone Encounter (Signed)
Copied from [place] [id number]. Topic: Quick Communication - See Telephone Encounter >> Nov 13, 2017 11:48 AM Rutherford Nail, NT wrote: CRM for notification. See Telephone encounter for: 11/13/17. Marita Kansas with [facility] calling and states that the patient is on Hydrocodone, alprazolam, and tramadol. States he at increased risk for over dose. States that Dr Sharlet Salina prescribed tramadol (30 day supply) on 5/22. Would like clarification and wants to make Dr Aundra Dubin aware of all of this. Please advise.  CB#: [phone]

## 2017-11-13 NOTE — Progress Notes (Signed)
Pre-visit discussion using our clinic review tool. No additional management support is needed unless otherwise documented below in the visit note.  

## 2017-11-13 NOTE — Telephone Encounter (Signed)
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

## 2017-11-13 NOTE — Telephone Encounter (Signed)
FYI
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
## 2017-11-14 LAB — URINALYSIS, ROUTINE W REFLEX MICROSCOPIC
Bilirubin, UA: NEGATIVE
Glucose, UA: NEGATIVE
Ketones, UA: NEGATIVE
LEUKOCYTES UA: NEGATIVE
Nitrite, UA: NEGATIVE
PROTEIN UA: NEGATIVE
RBC, UA: NEGATIVE
Specific Gravity, UA: 1.019 (ref 1.005–1.030)
Urobilinogen, Ur: 0.2 mg/dL (ref 0.2–1.0)
pH, UA: 5 (ref 5.0–7.5)

## 2017-11-16 ENCOUNTER — Encounter (INDEPENDENT_AMBULATORY_CARE_PROVIDER_SITE_OTHER): Payer: Self-pay | Admitting: Vascular Surgery

## 2017-11-16 ENCOUNTER — Ambulatory Visit (INDEPENDENT_AMBULATORY_CARE_PROVIDER_SITE_OTHER): Payer: 59 | Admitting: Vascular Surgery

## 2017-11-16 ENCOUNTER — Encounter (INDEPENDENT_AMBULATORY_CARE_PROVIDER_SITE_OTHER): Payer: Self-pay

## 2017-11-16 ENCOUNTER — Other Ambulatory Visit (INDEPENDENT_AMBULATORY_CARE_PROVIDER_SITE_OTHER): Payer: Self-pay | Admitting: Vascular Surgery

## 2017-11-16 VITALS — BP 140/95 | HR 88 | Ht 75.0 in | Wt 263.8 lb

## 2017-11-16 DIAGNOSIS — I824Y2 Acute embolism and thrombosis of unspecified deep veins of left proximal lower extremity: Secondary | ICD-10-CM

## 2017-11-16 DIAGNOSIS — G894 Chronic pain syndrome: Secondary | ICD-10-CM

## 2017-11-16 DIAGNOSIS — L03116 Cellulitis of left lower limb: Secondary | ICD-10-CM | POA: Diagnosis not present

## 2017-11-16 LAB — NT-PROBNP: PRO B NATRI PEPTIDE: 120 pg/mL

## 2017-11-16 LAB — PSA, TOTAL AND FREE
PSA, % Free: 14 % (calc) — ABNORMAL LOW (ref >25)
PSA, Free: 0.1 ng/mL
PSA, Total: 0.7 ng/mL (ref <=4.0)

## 2017-11-16 LAB — EXTRA LAV TOP TUBE
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
## 2017-11-17 ENCOUNTER — Encounter
Admission: RE | Disposition: A | Discharge status: Home / Self Care | Payer: Self-pay | Source: Ambulatory Visit | Attending: Vascular Surgery

## 2017-11-17 ENCOUNTER — Ambulatory Visit
Admission: RE | Admit: 2017-11-17 | Discharge: 2017-11-17 | Disposition: A | Discharge status: Home / Self Care | Payer: 59 | Source: Ambulatory Visit | Attending: Vascular Surgery | Admitting: Vascular Surgery

## 2017-11-17 DIAGNOSIS — F319 Bipolar disorder, unspecified: Secondary | ICD-10-CM | POA: Insufficient documentation

## 2017-11-17 DIAGNOSIS — G894 Chronic pain syndrome: Secondary | ICD-10-CM | POA: Diagnosis not present

## 2017-11-17 DIAGNOSIS — Z8249 Family history of ischemic heart disease and other diseases of the circulatory system: Secondary | ICD-10-CM | POA: Insufficient documentation

## 2017-11-17 DIAGNOSIS — Z9884 Bariatric surgery status: Secondary | ICD-10-CM | POA: Insufficient documentation

## 2017-11-17 DIAGNOSIS — F17210 Nicotine dependence, cigarettes, uncomplicated: Secondary | ICD-10-CM | POA: Insufficient documentation

## 2017-11-17 DIAGNOSIS — Z79899 Other long term (current) drug therapy: Secondary | ICD-10-CM | POA: Diagnosis not present

## 2017-11-17 DIAGNOSIS — H16002 Unspecified corneal ulcer, left eye: Secondary | ICD-10-CM | POA: Diagnosis not present

## 2017-11-17 DIAGNOSIS — Z87442 Personal history of urinary calculi: Secondary | ICD-10-CM | POA: Insufficient documentation

## 2017-11-17 DIAGNOSIS — Z9889 Other specified postprocedural states: Secondary | ICD-10-CM | POA: Insufficient documentation

## 2017-11-17 DIAGNOSIS — I82402 Acute embolism and thrombosis of unspecified deep veins of left lower extremity: Secondary | ICD-10-CM

## 2017-11-17 DIAGNOSIS — Z6832 Body mass index (BMI) 32.0-32.9, adult: Secondary | ICD-10-CM | POA: Insufficient documentation

## 2017-11-17 DIAGNOSIS — K219 Gastro-esophageal reflux disease without esophagitis: Secondary | ICD-10-CM | POA: Insufficient documentation

## 2017-11-17 DIAGNOSIS — D3501 Benign neoplasm of right adrenal gland: Secondary | ICD-10-CM | POA: Diagnosis not present

## 2017-11-17 DIAGNOSIS — G4733 Obstructive sleep apnea (adult) (pediatric): Secondary | ICD-10-CM | POA: Diagnosis not present

## 2017-11-17 DIAGNOSIS — L732 Hidradenitis suppurativa: Secondary | ICD-10-CM | POA: Insufficient documentation

## 2017-11-17 DIAGNOSIS — M1990 Unspecified osteoarthritis, unspecified site: Secondary | ICD-10-CM | POA: Insufficient documentation

## 2017-11-17 DIAGNOSIS — L03116 Cellulitis of left lower limb: Secondary | ICD-10-CM | POA: Insufficient documentation

## 2017-11-17 DIAGNOSIS — I824Y2 Acute embolism and thrombosis of unspecified deep veins of left proximal lower extremity: Secondary | ICD-10-CM | POA: Insufficient documentation

## 2017-11-17 DIAGNOSIS — N400 Enlarged prostate without lower urinary tract symptoms: Secondary | ICD-10-CM | POA: Insufficient documentation

## 2017-11-17 DIAGNOSIS — Z882 Allergy status to sulfonamides status: Secondary | ICD-10-CM | POA: Diagnosis not present

## 2017-11-17 DIAGNOSIS — J984 Other disorders of lung: Secondary | ICD-10-CM | POA: Insufficient documentation

## 2017-11-17 DIAGNOSIS — Z96653 Presence of artificial knee joint, bilateral: Secondary | ICD-10-CM | POA: Insufficient documentation

## 2017-11-17 DIAGNOSIS — M5136 Other intervertebral disc degeneration, lumbar region: Secondary | ICD-10-CM | POA: Insufficient documentation

## 2017-11-17 HISTORY — PX: PERIPHERAL VASCULAR THROMBECTOMY: CATH118306

## 2017-11-17 LAB — CREATININE, SERUM: Creatinine, Ser: 0.7 mg/dL (ref 0.61–1.24)

## 2017-11-17 LAB — BUN: BUN: 13 mg/dL (ref 6–20)

## 2017-11-17 SURGERY — PERIPHERAL VASCULAR THROMBECTOMY
Anesthesia: Moderate Sedation | Laterality: Left

## 2017-11-17 MED ORDER — ASPIRIN EC 81 MG PO TBEC: 81.0000 mg | DELAYED_RELEASE_TABLET | Freq: Every day | ORAL | 4 refills | Status: DC

## 2017-11-17 MED ORDER — HEPARIN SODIUM (PORCINE) 1000 UNIT/ML IJ SOLN
INTRAMUSCULAR | Status: DC | PRN
  Administered 2017-11-17: 4000 [IU] via INTRAVENOUS

## 2017-11-17 MED ORDER — HYDRALAZINE HCL 20 MG/ML IJ SOLN: 5.0000 mg | INTRAMUSCULAR | Status: DC | PRN

## 2017-11-17 MED ORDER — FENTANYL CITRATE (PF) 100 MCG/2ML IJ SOLN
INTRAMUSCULAR | Status: AC
  Filled 2017-11-17: qty 2

## 2017-11-17 MED ORDER — SODIUM CHLORIDE 0.9% FLUSH: 3.0000 mL | INTRAVENOUS | Status: DC | PRN

## 2017-11-17 MED ORDER — LABETALOL HCL 5 MG/ML IV SOLN: 10.0000 mg | INTRAVENOUS | Status: DC | PRN

## 2017-11-17 MED ORDER — SODIUM CHLORIDE 0.9 % IV SOLN
INTRAVENOUS | Status: DC
  Administered 2017-11-17: 13:00:00 via INTRAVENOUS

## 2017-11-17 MED ORDER — SODIUM CHLORIDE 0.9 % IV SOLN: INTRAVENOUS | Status: DC

## 2017-11-17 MED ORDER — LIDOCAINE HCL (PF) 1 % IJ SOLN
INTRAMUSCULAR | Status: AC
  Filled 2017-11-17: qty 30

## 2017-11-17 MED ORDER — RIVAROXABAN 20 MG PO TABS
20.0000 mg | ORAL_TABLET | Freq: Every day | ORAL | Status: DC
  Filled 2017-11-17: qty 1

## 2017-11-17 MED ORDER — ONDANSETRON HCL 4 MG/2ML IJ SOLN: 4.0000 mg | Freq: Four times a day (QID) | INTRAMUSCULAR | Status: DC | PRN

## 2017-11-17 MED ORDER — SODIUM CHLORIDE 0.9 % IV SOLN: 250.0000 mL | INTRAVENOUS | Status: DC | PRN

## 2017-11-17 MED ORDER — METHYLPREDNISOLONE SODIUM SUCC 125 MG IJ SOLR: 125.0000 mg | INTRAMUSCULAR | Status: DC | PRN

## 2017-11-17 MED ORDER — MIDAZOLAM HCL 2 MG/2ML IJ SOLN
INTRAMUSCULAR | Status: AC
  Filled 2017-11-17: qty 2

## 2017-11-17 MED ORDER — SODIUM CHLORIDE 0.9% FLUSH: 3.0000 mL | Freq: Two times a day (BID) | INTRAVENOUS | Status: DC

## 2017-11-17 MED ORDER — IOPAMIDOL (ISOVUE-300) INJECTION 61%
INTRAVENOUS | Status: DC | PRN
  Administered 2017-11-17: 75 mL via INTRA_ARTERIAL

## 2017-11-17 MED ORDER — FAMOTIDINE 20 MG PO TABS: 40.0000 mg | ORAL_TABLET | ORAL | Status: DC | PRN

## 2017-11-17 MED ORDER — CEFAZOLIN SODIUM-DEXTROSE 2-4 GM/100ML-% IV SOLN
2.0000 g | Freq: Once | INTRAVENOUS | Status: AC
  Administered 2017-11-17: 2 g via INTRAVENOUS

## 2017-11-17 MED ORDER — HEPARIN SODIUM (PORCINE) 1000 UNIT/ML IJ SOLN
INTRAMUSCULAR | Status: AC
  Filled 2017-11-17: qty 1

## 2017-11-17 MED ORDER — MIDAZOLAM HCL 5 MG/5ML IJ SOLN
INTRAMUSCULAR | Status: AC
  Filled 2017-11-17: qty 5

## 2017-11-17 MED ORDER — OXYCODONE HCL 5 MG PO TABS: 5.0000 mg | ORAL_TABLET | ORAL | Status: DC | PRN

## 2017-11-17 MED ORDER — ONDANSETRON HCL 4 MG/2ML IJ SOLN
4.0000 mg | Freq: Four times a day (QID) | INTRAMUSCULAR | Status: DC | PRN
Start: 2017-11-17 — End: 2017-11-18

## 2017-11-17 MED ORDER — ALTEPLASE 2 MG IJ SOLR
INTRAMUSCULAR | Status: AC
  Filled 2017-11-17: qty 12

## 2017-11-17 MED ORDER — HYDROMORPHONE HCL 1 MG/ML IJ SOLN: 1.0000 mg | Freq: Once | INTRAMUSCULAR | Status: DC | PRN

## 2017-11-17 MED ORDER — HEPARIN (PORCINE) IN NACL 1000-0.9 UT/500ML-% IV SOLN
INTRAVENOUS | Status: AC
  Filled 2017-11-17: qty 1000

## 2017-11-17 MED ORDER — ACETAMINOPHEN 325 MG PO TABS: 650.0000 mg | ORAL_TABLET | ORAL | Status: DC | PRN

## 2017-11-17 MED ORDER — MIDAZOLAM HCL 2 MG/2ML IJ SOLN
INTRAMUSCULAR | Status: DC | PRN
  Administered 2017-11-17: 2 mg via INTRAVENOUS
  Administered 2017-11-17: 1 mg via INTRAVENOUS
  Administered 2017-11-17: 0.5 mg via INTRAVENOUS
  Administered 2017-11-17: 1 mg via INTRAVENOUS
  Administered 2017-11-17: 2 mg via INTRAVENOUS

## 2017-11-17 MED ORDER — MORPHINE SULFATE (PF) 4 MG/ML IV SOLN: 2.0000 mg | INTRAVENOUS | Status: DC | PRN

## 2017-11-17 MED ORDER — FENTANYL CITRATE (PF) 100 MCG/2ML IJ SOLN
INTRAMUSCULAR | Status: DC | PRN
  Administered 2017-11-17: 50 ug via INTRAVENOUS
  Administered 2017-11-17: 25 ug via INTRAVENOUS
  Administered 2017-11-17 (×3): 50 ug via INTRAVENOUS

## 2017-11-17 SURGICAL SUPPLY — 25 items
BALLN ATG 16X6X80 (BALLOONS) ×2
BALLN LUTONIX AV 12X40X75 (BALLOONS) ×2
BALLOON ATG 16X6X80 (BALLOONS) IMPLANT
BALLOON LUTONIX AV 12X40X75 (BALLOONS) IMPLANT
CANISTER PENUMBRA ENGINE (MISCELLANEOUS) ×1 IMPLANT
CATH BEACON 5 .035 65 KMP TIP (CATHETERS) ×1 IMPLANT
CATH BEACON 5 .038 100 VERT TP (CATHETERS) ×1 IMPLANT
CATH INDIGO 8XTORQ 115 KIT (CATHETERS) ×1 IMPLANT
COVER PROBE U/S 5X48 (MISCELLANEOUS) ×2 IMPLANT
DEVICE PRESTO INFLATION (MISCELLANEOUS) ×1 IMPLANT
DEVICE TORQUE .025-.038 (MISCELLANEOUS) ×1 IMPLANT
DRAPE BRACHIAL (DRAPES) ×1 IMPLANT
DRAPE FEMORAL ANGIO W/ POUCH (DRAPES) ×1 IMPLANT
GUIDEWIRE ANGLED .035 180CM (WIRE) ×1 IMPLANT
KIT FEMORAL DEL DENALI (Miscellaneous) ×1 IMPLANT
NDL ENTRY 21GA 7CM ECHOTIP (NEEDLE) IMPLANT
NEEDLE ENTRY 21GA 7CM ECHOTIP (NEEDLE) ×2 IMPLANT
PACK ANGIOGRAPHY (CUSTOM PROCEDURE TRAY) ×2 IMPLANT
SET INTRO CAPELLA COAXIAL (SET/KITS/TRAYS/PACK) ×1 IMPLANT
SHEATH BRITE TIP 10FRX11 (SHEATH) ×1 IMPLANT
SHEATH BRITE TIP 8FRX11 (SHEATH) ×1 IMPLANT
STENT VENOVO 20X60X80 (Permanent Stent) ×1 IMPLANT
SYR MEDRAD MARK V 150ML (SYRINGE) ×1 IMPLANT
WIRE J 3MM .035X145CM (WIRE) ×2 IMPLANT
WIRE MAGIC TORQUE 260C (WIRE) ×1 IMPLANT

## 2017-11-17 NOTE — H&P (Signed)
[facility] History & Physical Update  The patient was interviewed and re-examined.  The patient's previous History and Physical has been reviewed and is unchanged.  There is no change in the plan of care. We plan to proceed with the scheduled procedure.  Hortencia Pilar, MD  11/17/2017, 1:11 PM

## 2017-11-17 NOTE — Progress Notes (Signed)
Dr. Delana Meyer at bedside , speaking with pt.. Pt. Verbalized understanding of converstation. Right groin and left popliteal areas without any complications.

## 2017-11-17 NOTE — Op Note (Signed)
[facility]                                                        OPERATIVE NOTE    PRE-OPERATIVE DIAGNOSIS: Symptomatic left leg DVT;  POST-OPERATIVE DIAGNOSIS: Same  PROCEDURE: 1. Ultrasound guidance for vascular access to the right common femoral vein and left popliteal vein 2. Catheter placement into the inferior vena cava for placement of IVC filter right common femoral venous approach 3. Inferior venacavogram 4. Placement of a Denali IVC filter infrarenal at the L2-L3 level 5. Introduction catheter into venous system second order catheter placement left popliteal approach 6. Mechanical thrombectomy of the left common iliac vein using the penumbra cat 8 catheter 7.    Percutaneous transluminal angioplasty and stent placement left common iliac vein with a 20 mm x 60 mm Venovo stent  SURGEON: Hortencia Pilar  ASSISTANT(S): None  ANESTHESIA: Conscious sedation was administered by the interventional radiology RN under my direct supervision. IV Versed plus fentanyl were utilized. Continuous ECG, pulse oximetry and blood pressure was monitored throughout the entire procedure.  Conscious sedation was administered for a total of 111 minutes.  ESTIMATED BLOOD LOSS: minimal  Contrast: 75  Fluoroscopy time: 8.6  FINDING(S): 1. Patent IVC; thrombus within the left common iliac vein with associated 80% stricture vein  SPECIMEN(S): none  INDICATIONS:  [name] is a 57 y.o. year old male who presents with massive swelling of the left leg quite painful in association with pleuritic chest pains and hypoxia as well as shortness of breath. Inferior vena cava filter is indicated for this reason. Risks and benefits including filter thrombosis, migration, fracture, bleeding, and infection were all discussed. We discussed that all IVC filters that we place can be removed if desired from the patient once the need for the filter  has passed.   DESCRIPTION: After obtaining full informed written consent, the patient was brought back to the vascular suite. The skin was sterilely prepped and draped in a sterile surgical field was created.  Ultrasound was placed in a sterile sleeve.  The right common femoral vein was image with the ultrasound.  It was echolucent and compressible indicating patency.  Image was recorded for the permanent record. The right common femoral vein was accessed under direct ultrasound guidance without difficulty with a Seldinger needle and a J wire was advanced without difficulty.   The dilator is passed over the wire and the delivery sheath was placed into the inferior vena cava. Inferior venacavogram was performed. This demonstrated a patent IVC with the level of the renal veins at L. IVC was measured and noted to be 26.1 mm in diameter.  This was verified with the markers on the dilator of the delivery sheath.  The filter was then deployed into the inferior vena cava at the level of inferior margin of L2-L3  just below the renal veins. The delivery sheath was then removed. Pressure was held. Sterile dressings were placed. The patient tolerated the procedure well.  The patient was then repositioned to the prone and the popliteal fossa of the left leg was prepped and draped in a sterile fashion. Ultrasound was placed in a sterile sleeve. Ultrasound  is utilized to gain access to the popliteal vein. Vein is echolucent and compressible indicating patency at this level.  Image was recorded for the permanent record.  Micro needle followed by microwire was then inserted under direct ultrasound visualization.  The micro-sheath was placed followed by a floppy glide wire and subsequently an 8 [place] sheath.  KMP catheter together with the floppy glide wire then advanced up to the level of the common femoral and then repositioned to the external iliac veins and hand injection contrast is utilized to demonstrate that the  common iliac vein on the left is patent the inferior vena cava is known to be patent from the above filter placement.  Hand-injection through the KMP catheter at the level of the external iliac vein then demonstrated thrombus within the common iliac vein with an associated 90% stenosis.  The confluence of the left common iliac vein with the inferior vena cava was widely patent proximal to the stenosis as was noted during filter placement  Penumbra cat 8 catheter is then prepped on the field and it is advanced over the wire up to the common iliac vein where the distal portion of the vein is treated.  Imaging not only is consistent with the narrowing but also the haziness more proximally suggests the acute thrombus associated with the stricture.  Multiple passes were made.  Review of the material collected in the retrieval basin demonstrated both acute as well as subacute thrombotic material.  Follow-up imaging now demonstrates a significant decrease in the thrombus burden but a stricture of approximately 50% persisted.  The Magic torque wire was then reintroduced and initially a 10 mm x 40 mm Lutonix drug-eluting balloon was used to treat the stricture subsequently a 16 mm x 60 mm Atlas balloon was inflated across the stricture both of these inflations were to 12 atm for 1 minute.  Follow-up imaging demonstrated greater than 40% residual stenosis I elected to place a stent across this lesion.  A 20 mm x 60 mm Venovo stent was deployed without difficulty and postdilated with the 16 mm balloon.  Inflation was to 12 atm for 30 seconds.  Follow-up imaging now demonstrated wide patency of the iliac vein with less than 5% residual stenosis.    Magnified image of the IVC and filter demonstrates that the IVC and the filter remained widely patent with no evidence of thrombus within the filter. Given that the patient now is widely patent with minimal residual thrombus from the distal popliteal to the IVC the procedure  is terminated. The wires removed the sheath is removed pressures held and a pressure dressing with Coban and is then applied. The patient is then returned to the supine position  Interpretation: Initial images demonstrated normal vena cava, measurements indicate the cava is 26.1 mm in diameter.  The [facility] filter is placed without difficulty in good orientation. Initial images the left lower extremity then demonstrated the popliteal, superficial femoral vein, common femoral vein and external iliac veins are widely patent.  The common iliac vein demonstrates a 90% stenosis with associated haziness consistent with acute thrombus distal to the stricture.  Following mechanical thrombectomy the acute appearing thrombus has been removed and there is a significant improvement in the luminal gain.  Angioplasty to 16 mm with an Atlas balloon did not yield an adequate result with greater than 50% residual stenosis and therefore a Venovo stent was placed and postdilated without incident resulting in less than 5% residual stenosis and no evidence of residual  thrombus within the left common iliac.  COMPLICATIONS: None  CONDITION: Stable  Hortencia Pilar  11/17/2017,1:59 PM

## 2017-11-18 ENCOUNTER — Encounter: Payer: Self-pay | Admitting: Internal Medicine

## 2017-11-18 ENCOUNTER — Encounter: Payer: Self-pay | Admitting: Vascular Surgery

## 2017-11-18 ENCOUNTER — Telehealth: Payer: Self-pay | Admitting: Internal Medicine

## 2017-11-18 DIAGNOSIS — Z0279 Encounter for issue of other medical certificate: Secondary | ICD-10-CM

## 2017-11-18 NOTE — Telephone Encounter (Signed)
Paperwork has been placed in provider folder for signature

## 2017-11-18 NOTE — Telephone Encounter (Signed)
Pt dropped off short-term disability papers to be completed by Dr. Marigene Ehlers. Papers are up front in colored folder.

## 2017-11-19 ENCOUNTER — Encounter (INDEPENDENT_AMBULATORY_CARE_PROVIDER_SITE_OTHER): Payer: Self-pay | Admitting: Vascular Surgery

## 2017-11-19 ENCOUNTER — Encounter (INDEPENDENT_AMBULATORY_CARE_PROVIDER_SITE_OTHER): Payer: Self-pay

## 2017-11-23 ENCOUNTER — Encounter: Payer: Self-pay | Admitting: Internal Medicine

## 2017-11-25 ENCOUNTER — Ambulatory Visit
Admission: RE | Admit: 2017-11-25 | Discharge: 2017-11-25 | Disposition: A | Discharge status: Home / Self Care | Payer: 59 | Source: Ambulatory Visit | Attending: Internal Medicine | Admitting: Internal Medicine

## 2017-11-25 ENCOUNTER — Encounter: Payer: Self-pay | Admitting: Internal Medicine

## 2017-11-25 ENCOUNTER — Telehealth: Payer: Self-pay | Admitting: Internal Medicine

## 2017-11-25 DIAGNOSIS — E041 Nontoxic single thyroid nodule: Secondary | ICD-10-CM | POA: Insufficient documentation

## 2017-11-25 NOTE — Telephone Encounter (Signed)
Copied from [place] [id number]. Topic: Quick Communication - See Telephone Encounter >> Nov 25, 2017  4:57 PM Rutherford Nail, NT wrote: CRM for notification. See Telephone encounter for: 11/25/17. Patient calling in regards to the biopsy of his thyroid. Would like it done before Monday if possible. States that he was supposed to go back to work on Monday 11/30/17 and go back for a while without having interruptions. Please advise. CB#: [phone]

## 2017-11-25 NOTE — Telephone Encounter (Signed)
Please advise 

## 2017-11-26 ENCOUNTER — Other Ambulatory Visit: Payer: Self-pay | Admitting: Internal Medicine

## 2017-11-26 DIAGNOSIS — E041 Nontoxic single thyroid nodule: Secondary | ICD-10-CM

## 2017-11-26 NOTE — Telephone Encounter (Signed)
FYI

## 2017-11-26 NOTE — Telephone Encounter (Signed)
They will not be able to work him in urgently for this esp not by Monday  The appt will likely be months out from now   [facility]

## 2017-12-01 NOTE — Telephone Encounter (Signed)
Patient has been informed.

## 2017-12-02 ENCOUNTER — Telehealth: Payer: Self-pay | Admitting: Internal Medicine

## 2017-12-02 ENCOUNTER — Encounter: Payer: Self-pay | Admitting: Internal Medicine

## 2017-12-02 ENCOUNTER — Other Ambulatory Visit: Payer: Self-pay | Admitting: Internal Medicine

## 2017-12-02 ENCOUNTER — Telehealth: Payer: Self-pay | Admitting: *Deleted

## 2017-12-02 DIAGNOSIS — I824Y9 Acute embolism and thrombosis of unspecified deep veins of unspecified proximal lower extremity: Secondary | ICD-10-CM

## 2017-12-02 MED ORDER — RIVAROXABAN 20 MG PO TABS: 20.0000 mg | ORAL_TABLET | Freq: Every day | ORAL | 1 refills | Status: DC

## 2017-12-02 NOTE — Telephone Encounter (Signed)
Left message to call office P[facility] may advise.

## 2017-12-02 NOTE — Telephone Encounter (Signed)
Copied from [place] [phone]. Topic: General - Other >> Dec 02, 2017 12:59 PM Yvette Rack wrote: Reason for CRM: pt calling about his short term disability form for his job he dropped it off 2 weeks ago please give him a call back at [phone] pt would like to pick the form up pt was out of work  until Monday 1st

## 2017-12-02 NOTE — Telephone Encounter (Signed)
Xarelto refill.  Last Refill: Previously prescribed in ED on 11/07/17  #51 Last OV: 11/13/17 PCP: Dr. Terese Door [facility]: [facility] [address]

## 2017-12-02 NOTE — Telephone Encounter (Signed)
Please advise 

## 2017-12-02 NOTE — Telephone Encounter (Signed)
Will have done can pick up Friday   [facility]

## 2017-12-02 NOTE — Telephone Encounter (Signed)
Copied from [place] [phone]. Topic: Quick Communication - Rx Refill/Question >> Dec 02, 2017  1:25 PM Oliver Pila B wrote: Medication: rivaroxaban (XARELTO) tablet 20 mg   [[id number]]   Has the patient contacted their pharmacy? Yes.   (Agent: If no, request that the patient contact the pharmacy for the refill.) (Agent: If yes, when and what did the pharmacy advise?)  Preferred Pharmacy (with phone number or street name): [facility]  Agent: Please be advised that RX refills may take up to 3 business days. We ask that you follow-up with your pharmacy.

## 2017-12-02 NOTE — Telephone Encounter (Signed)
Patient states he would be by to pick up paperwork Friday afternoon when he got off of work at 4:00pm

## 2017-12-04 NOTE — Telephone Encounter (Signed)
Paperwork at front desk , has been faxed and sent to billing and copy to scan.

## 2017-12-07 ENCOUNTER — Ambulatory Visit (INDEPENDENT_AMBULATORY_CARE_PROVIDER_SITE_OTHER): Payer: 59 | Admitting: Vascular Surgery

## 2017-12-07 ENCOUNTER — Encounter (INDEPENDENT_AMBULATORY_CARE_PROVIDER_SITE_OTHER): Payer: Self-pay | Admitting: Vascular Surgery

## 2017-12-07 VITALS — BP 146/96 | HR 90 | Resp 14 | Ht 75.5 in | Wt 262.0 lb

## 2017-12-07 DIAGNOSIS — M1990 Unspecified osteoarthritis, unspecified site: Secondary | ICD-10-CM | POA: Diagnosis not present

## 2017-12-07 DIAGNOSIS — I824Y2 Acute embolism and thrombosis of unspecified deep veins of left proximal lower extremity: Secondary | ICD-10-CM | POA: Diagnosis not present

## 2017-12-07 DIAGNOSIS — E042 Nontoxic multinodular goiter: Secondary | ICD-10-CM | POA: Diagnosis not present

## 2017-12-07 DIAGNOSIS — R600 Localized edema: Secondary | ICD-10-CM | POA: Diagnosis not present
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
## 2017-12-10 DIAGNOSIS — M48062 Spinal stenosis, lumbar region with neurogenic claudication: Secondary | ICD-10-CM | POA: Diagnosis not present

## 2017-12-12 ENCOUNTER — Encounter (INDEPENDENT_AMBULATORY_CARE_PROVIDER_SITE_OTHER): Payer: Self-pay | Admitting: Vascular Surgery

## 2017-12-22 DIAGNOSIS — M545 Low back pain: Secondary | ICD-10-CM | POA: Diagnosis not present

## 2017-12-22 DIAGNOSIS — R2689 Other abnormalities of gait and mobility: Secondary | ICD-10-CM | POA: Diagnosis not present

## 2017-12-22 DIAGNOSIS — M48062 Spinal stenosis, lumbar region with neurogenic claudication: Secondary | ICD-10-CM | POA: Diagnosis not present

## 2017-12-23 DIAGNOSIS — E042 Nontoxic multinodular goiter: Secondary | ICD-10-CM | POA: Diagnosis not present

## 2017-12-24 DIAGNOSIS — E042 Nontoxic multinodular goiter: Secondary | ICD-10-CM | POA: Diagnosis not present

## 2017-12-25 DIAGNOSIS — Z87891 Personal history of nicotine dependence: Secondary | ICD-10-CM | POA: Diagnosis not present

## 2017-12-27 ENCOUNTER — Encounter: Payer: Self-pay | Admitting: Internal Medicine

## 2017-12-29 ENCOUNTER — Telehealth: Payer: Self-pay

## 2017-12-29 DIAGNOSIS — M545 Low back pain: Secondary | ICD-10-CM | POA: Diagnosis not present

## 2017-12-29 DIAGNOSIS — R2689 Other abnormalities of gait and mobility: Secondary | ICD-10-CM | POA: Diagnosis not present

## 2017-12-29 DIAGNOSIS — M48062 Spinal stenosis, lumbar region with neurogenic claudication: Secondary | ICD-10-CM | POA: Diagnosis not present

## 2017-12-29 NOTE — Telephone Encounter (Signed)
Please advose?  

## 2017-12-29 NOTE — Telephone Encounter (Signed)
I would need a form faxed here from Dr. Daun Peacock [phone] to fill out and fax back attn Dr. Kelly Services I will complete this form and see if he needs appt will let him know  [place]

## 2017-12-29 NOTE — Telephone Encounter (Signed)
Copied from [place] [id number]. Topic: General - Other >> Dec 29, 2017  2:51 PM Synthia Innocent wrote: Reason for CRM:  Dr [facility] is needing surgical clearance, surgery is scheduled for 01/27/18. Patient would like to know if needs to be seen again, last seen in June. Has been cleared by vascular dr.  .

## 2017-12-31 ENCOUNTER — Ambulatory Visit: Payer: Self-pay | Admitting: Internal Medicine

## 2018-01-04 DIAGNOSIS — C73 Malignant neoplasm of thyroid gland: Secondary | ICD-10-CM | POA: Diagnosis not present

## 2018-01-12 DIAGNOSIS — M48062 Spinal stenosis, lumbar region with neurogenic claudication: Secondary | ICD-10-CM | POA: Diagnosis not present

## 2018-01-12 DIAGNOSIS — M545 Low back pain: Secondary | ICD-10-CM | POA: Diagnosis not present

## 2018-01-12 DIAGNOSIS — R2689 Other abnormalities of gait and mobility: Secondary | ICD-10-CM | POA: Diagnosis not present

## 2018-01-19 DIAGNOSIS — C73 Malignant neoplasm of thyroid gland: Secondary | ICD-10-CM | POA: Diagnosis not present

## 2018-01-19 DIAGNOSIS — R599 Enlarged lymph nodes, unspecified: Secondary | ICD-10-CM | POA: Diagnosis not present

## 2018-01-20 ENCOUNTER — Encounter
Admission: RE | Admit: 2018-01-20 | Discharge: 2018-01-20 | Disposition: A | Discharge status: Home / Self Care | Payer: 59 | Source: Ambulatory Visit | Attending: Neurosurgery | Admitting: Neurosurgery

## 2018-01-20 ENCOUNTER — Other Ambulatory Visit: Payer: Self-pay

## 2018-01-20 DIAGNOSIS — G4733 Obstructive sleep apnea (adult) (pediatric): Secondary | ICD-10-CM | POA: Insufficient documentation

## 2018-01-20 DIAGNOSIS — Z01810 Encounter for preprocedural cardiovascular examination: Secondary | ICD-10-CM | POA: Insufficient documentation

## 2018-01-20 DIAGNOSIS — Z01812 Encounter for preprocedural laboratory examination: Secondary | ICD-10-CM | POA: Insufficient documentation

## 2018-01-20 HISTORY — DX: Peripheral vascular disease, unspecified: I73.9

## 2018-01-20 HISTORY — DX: Malignant (primary) neoplasm, unspecified: C80.1

## 2018-01-20 HISTORY — DX: Acute embolism and thrombosis of unspecified deep veins of unspecified lower extremity: I82.409

## 2018-01-20 LAB — URINALYSIS, ROUTINE W REFLEX MICROSCOPIC
Bilirubin Urine: NEGATIVE
Glucose, UA: NEGATIVE mg/dL
Hgb urine dipstick: NEGATIVE
Ketones, ur: NEGATIVE mg/dL
LEUKOCYTES UA: NEGATIVE
Nitrite: NEGATIVE
PROTEIN: NEGATIVE mg/dL
Specific Gravity, Urine: 1.013 (ref 1.005–1.030)
pH: 5 (ref 5.0–8.0)

## 2018-01-20 LAB — CBC
HEMATOCRIT: 41.4 % (ref 40.0–52.0)
HEMOGLOBIN: 14.1 g/dL (ref 13.0–18.0)
MCH: 30.8 pg (ref 26.0–34.0)
MCHC: 33.9 g/dL (ref 32.0–36.0)
MCV: 90.9 fL (ref 80.0–100.0)
Platelets: 287 10*3/uL (ref 150–440)
RBC: 4.56 MIL/uL (ref 4.40–5.90)
RDW: 16.1 % — ABNORMAL HIGH (ref 11.5–14.5)
WBC: 5.4 10*3/uL (ref 3.8–10.6)

## 2018-01-20 LAB — BASIC METABOLIC PANEL
ANION GAP: 8 (ref 5–15)
BUN: 12 mg/dL (ref 6–20)
CHLORIDE: 104 mmol/L (ref 98–111)
CO2: 30 mmol/L (ref 22–32)
Calcium: 8.9 mg/dL (ref 8.9–10.3)
Creatinine, Ser: 0.82 mg/dL (ref 0.61–1.24)
GFR calc Af Amer: >60 mL/min (ref >60)
Glucose, Bld: 101 mg/dL — ABNORMAL HIGH (ref 70–99)
POTASSIUM: 4 mmol/L (ref 3.5–5.1)
Sodium: 142 mmol/L (ref 135–145)

## 2018-01-20 LAB — PROTIME-INR
INR: 1.63
Prothrombin Time: 19.2 seconds — ABNORMAL HIGH (ref 11.4–15.2)

## 2018-01-20 LAB — SURGICAL PCR SCREEN
MRSA, PCR: NEGATIVE
Staphylococcus aureus: NEGATIVE

## 2018-01-20 LAB — APTT: aPTT: 37 seconds — ABNORMAL HIGH (ref 24–36)

## 2018-01-20 NOTE — Patient Instructions (Signed)
Your procedure is scheduled on: Wednesday 01/27/18.  Report to DAY SURGERY DEPARTMENT LOCATED ON 2ND FLOOR MEDICAL MALL ENTRANCE. To find out your arrival time please call [phone] between 1PM - 3PM on Tuesday 01/26/18  Remember: Instructions that are not followed completely may result in serious medical risk, up to and including death, or upon the discretion of your surgeon and anesthesiologist your surgery may need to be rescheduled.     _X__ 1. Do not eat food after midnight the night before your procedure.                 No gum chewing or hard candies. You may drink clear liquids up to 2 hours                 before you are scheduled to arrive for your surgery- DO not drink clear                 liquids within 2 hours of the start of your surgery.                 Clear Liquids include:  water, apple juice without pulp, clear carbohydrate                 drink such as Clearfast or Gatorade, Black Coffee or Tea (Do not add                 anything to coffee or tea).  __X__2.  On the morning of surgery brush your teeth with toothpaste and water, you  may rinse your mouth with mouthwash if you wish.  Do not swallow any              toothpaste of mouthwash.     _X__ 3.  No Alcohol for 24 hours before or after surgery.   _X__ 4.  Do Not Smoke or use e-cigarettes For 24 Hours Prior to Your Surgery.                 Do not use any chewable tobacco products for at least 6 hours prior to                 surgery.  ____  5.  Bring all medications with you on the day of surgery if instructed.   __X__  6.  Notify your doctor if there is any change in your medical condition      (cold, fever, infections).     Do not wear jewelry, make-up, hairpins, clips or nail polish. Do not wear lotions, powders, or perfumes.  Do not shave 48 hours prior to surgery. Men may shave face and neck. Do not bring valuables to the hospital.    [facility] is not responsible for any belongings or  valuables.  Contacts, dentures/partials or body piercings may not be worn into surgery. Bring a case for your contacts, glasses or hearing aids, a denture cup will be supplied. Leave your suitcase in the car. After surgery it may be brought to your room. For patients admitted to the hospital, discharge time is determined by your treatment team.   Patients discharged the day of surgery will not be allowed to drive home.   Please read over the following fact sheets that you were given:   MRSA Information  __X__ Take these medicines the morning of surgery with A SIP OF WATER:     1. albuterol (PROVENTIL HFA;VENTOLIN HFA) 108 (90 Base) MCG/ACT inhaler  2. ALPRAZolam (  XANAX) 1 MG tablet  3. methocarbamol (ROBAXIN) 500 MG tablet IF NEEDED  4. traMADol (ULTRAM) 50 MG tablet IF NEEDED   5. acetaminophen (TYLENOL) 500 MG tablet  6.     __X__ Use CHG Soap as directed  _ X___ Use inhalers on the day of surgery. Also bring the inhaler with you to the hospital on the morning of surgery.     __X__ Stop Blood Thinners Coumadin/Plavix/Xarelto/Pleta/Pradaxa/Eliquis/Effient/Aspirin TODAY.  __X__ Stop Anti-inflammatories 7 days before surgery such as Advil, Ibuprofen, Motrin, BC or Goodies Powder, Naprosyn, Naproxen, Aleve, Aspirin, Meloxicam, Voltaren. May take Tylenol if needed for pain or discomfort.   __X__ Stop all herbal supplements, fish oil or vitamin E until after surgery. TODAY.

## 2018-01-27 ENCOUNTER — Encounter: Payer: Self-pay | Admitting: Certified Registered Nurse Anesthetist

## 2018-01-27 ENCOUNTER — Inpatient Hospital Stay: Payer: 59 | Admitting: Anesthesiology

## 2018-01-27 ENCOUNTER — Other Ambulatory Visit: Payer: Self-pay

## 2018-01-27 ENCOUNTER — Inpatient Hospital Stay: Payer: 59

## 2018-01-27 ENCOUNTER — Encounter
Admission: RE | Disposition: A | Discharge status: Home Health | Payer: Self-pay | Source: Home / Self Care | Attending: Neurosurgery

## 2018-01-27 ENCOUNTER — Inpatient Hospital Stay
Admission: RE | Admit: 2018-01-27 | Discharge: 2018-01-29 | DRG: 455 | Disposition: A | Discharge status: Home Health | Payer: 59 | Attending: Neurosurgery | Admitting: Neurosurgery

## 2018-01-27 DIAGNOSIS — Z9884 Bariatric surgery status: Secondary | ICD-10-CM

## 2018-01-27 DIAGNOSIS — Z7951 Long term (current) use of inhaled steroids: Secondary | ICD-10-CM | POA: Diagnosis not present

## 2018-01-27 DIAGNOSIS — R918 Other nonspecific abnormal finding of lung field: Secondary | ICD-10-CM | POA: Diagnosis present

## 2018-01-27 DIAGNOSIS — Z7982 Long term (current) use of aspirin: Secondary | ICD-10-CM

## 2018-01-27 DIAGNOSIS — Z888 Allergy status to other drugs, medicaments and biological substances status: Secondary | ICD-10-CM | POA: Diagnosis not present

## 2018-01-27 DIAGNOSIS — M48062 Spinal stenosis, lumbar region with neurogenic claudication: Principal | ICD-10-CM | POA: Diagnosis present

## 2018-01-27 DIAGNOSIS — F319 Bipolar disorder, unspecified: Secondary | ICD-10-CM | POA: Diagnosis present

## 2018-01-27 DIAGNOSIS — Z8042 Family history of malignant neoplasm of prostate: Secondary | ICD-10-CM

## 2018-01-27 DIAGNOSIS — N400 Enlarged prostate without lower urinary tract symptoms: Secondary | ICD-10-CM | POA: Diagnosis present

## 2018-01-27 DIAGNOSIS — G4733 Obstructive sleep apnea (adult) (pediatric): Secondary | ICD-10-CM | POA: Diagnosis present

## 2018-01-27 DIAGNOSIS — Z882 Allergy status to sulfonamides status: Secondary | ICD-10-CM | POA: Diagnosis not present

## 2018-01-27 DIAGNOSIS — Z419 Encounter for procedure for purposes other than remedying health state, unspecified: Secondary | ICD-10-CM

## 2018-01-27 DIAGNOSIS — Z96653 Presence of artificial knee joint, bilateral: Secondary | ICD-10-CM | POA: Diagnosis present

## 2018-01-27 DIAGNOSIS — M4326 Fusion of spine, lumbar region: Secondary | ICD-10-CM | POA: Diagnosis not present

## 2018-01-27 DIAGNOSIS — Z79891 Long term (current) use of opiate analgesic: Secondary | ICD-10-CM | POA: Diagnosis not present

## 2018-01-27 DIAGNOSIS — F411 Generalized anxiety disorder: Secondary | ICD-10-CM | POA: Diagnosis present

## 2018-01-27 DIAGNOSIS — G9519 Other vascular myelopathies: Secondary | ICD-10-CM | POA: Diagnosis present

## 2018-01-27 DIAGNOSIS — K5900 Constipation, unspecified: Secondary | ICD-10-CM | POA: Diagnosis not present

## 2018-01-27 DIAGNOSIS — M5126 Other intervertebral disc displacement, lumbar region: Secondary | ICD-10-CM | POA: Diagnosis present

## 2018-01-27 DIAGNOSIS — F17210 Nicotine dependence, cigarettes, uncomplicated: Secondary | ICD-10-CM | POA: Diagnosis present

## 2018-01-27 DIAGNOSIS — Z8249 Family history of ischemic heart disease and other diseases of the circulatory system: Secondary | ICD-10-CM | POA: Diagnosis not present

## 2018-01-27 DIAGNOSIS — Z7901 Long term (current) use of anticoagulants: Secondary | ICD-10-CM | POA: Diagnosis not present

## 2018-01-27 DIAGNOSIS — R29818 Other symptoms and signs involving the nervous system: Secondary | ICD-10-CM | POA: Diagnosis present

## 2018-01-27 DIAGNOSIS — Z79899 Other long term (current) drug therapy: Secondary | ICD-10-CM | POA: Diagnosis not present

## 2018-01-27 DIAGNOSIS — Z981 Arthrodesis status: Secondary | ICD-10-CM

## 2018-01-27 DIAGNOSIS — K219 Gastro-esophageal reflux disease without esophagitis: Secondary | ICD-10-CM | POA: Diagnosis present

## 2018-01-27 LAB — PROTIME-INR
INR: 0.87
Prothrombin Time: 11.8 seconds (ref 11.4–15.2)

## 2018-01-27 LAB — GLUCOSE, CAPILLARY: Glucose-Capillary: 166 mg/dL — ABNORMAL HIGH (ref 70–99)

## 2018-01-27 SURGERY — POSTERIOR LUMBAR FUSION 1 LEVEL
Anesthesia: General | Site: Back | Wound class: Clean

## 2018-01-27 MED ORDER — FAMOTIDINE 20 MG PO TABS
ORAL_TABLET | ORAL | Status: AC
  Filled 2018-01-27: qty 1

## 2018-01-27 MED ORDER — BUPIVACAINE LIPOSOME 1.3 % IJ SUSP
INTRAMUSCULAR | Status: AC
  Filled 2018-01-27: qty 20

## 2018-01-27 MED ORDER — SUCCINYLCHOLINE CHLORIDE 20 MG/ML IJ SOLN
INTRAMUSCULAR | Status: DC | PRN
  Administered 2018-01-27: 180 mg via INTRAVENOUS

## 2018-01-27 MED ORDER — ALPRAZOLAM 1 MG PO TABS
1.0000 mg | ORAL_TABLET | Freq: Three times a day (TID) | ORAL | Status: DC | PRN
  Administered 2018-01-27 – 2018-01-28 (×2): 1 mg via ORAL
  Filled 2018-01-27 (×2): qty 1

## 2018-01-27 MED ORDER — SODIUM CHLORIDE 0.9 % IR SOLN
Status: DC | PRN
  Administered 2018-01-27: 1000 mL

## 2018-01-27 MED ORDER — ACETAMINOPHEN 10 MG/ML IV SOLN
INTRAVENOUS | Status: DC | PRN
  Administered 2018-01-27: 1000 mg via INTRAVENOUS

## 2018-01-27 MED ORDER — HYDROMORPHONE HCL 1 MG/ML IJ SOLN
INTRAMUSCULAR | Status: DC | PRN
  Administered 2018-01-27: 1 mg via INTRAVENOUS

## 2018-01-27 MED ORDER — MENTHOL 3 MG MT LOZG
1.0000 | LOZENGE | OROMUCOSAL | Status: DC | PRN
  Filled 2018-01-27: qty 9

## 2018-01-27 MED ORDER — QUETIAPINE FUMARATE 300 MG PO TABS
300.0000 mg | ORAL_TABLET | Freq: Every day | ORAL | Status: DC
  Administered 2018-01-27 – 2018-01-28 (×2): 300 mg via ORAL
  Filled 2018-01-27 (×3): qty 1

## 2018-01-27 MED ORDER — KETAMINE HCL 50 MG/ML IJ SOLN
INTRAMUSCULAR | Status: DC | PRN
  Administered 2018-01-27: 50 mg via INTRAMUSCULAR

## 2018-01-27 MED ORDER — ROCURONIUM BROMIDE 100 MG/10ML IV SOLN
INTRAVENOUS | Status: DC | PRN
  Administered 2018-01-27: 30 mg via INTRAVENOUS
  Administered 2018-01-27: 5 mg via INTRAVENOUS

## 2018-01-27 MED ORDER — PHENYLEPHRINE HCL 10 MG/ML IJ SOLN
INTRAMUSCULAR | Status: DC | PRN
  Administered 2018-01-27: 100 ug via INTRAVENOUS

## 2018-01-27 MED ORDER — PROPOFOL 10 MG/ML IV BOLUS
INTRAVENOUS | Status: AC
  Filled 2018-01-27: qty 20

## 2018-01-27 MED ORDER — SODIUM CHLORIDE FLUSH 0.9 % IV SOLN
INTRAVENOUS | Status: AC
  Filled 2018-01-27: qty 3

## 2018-01-27 MED ORDER — OXYCODONE HCL 5 MG PO TABS
5.0000 mg | ORAL_TABLET | ORAL | Status: DC | PRN
  Administered 2018-01-27 (×2): 5 mg via ORAL
  Filled 2018-01-27 (×2): qty 1

## 2018-01-27 MED ORDER — BUPIVACAINE-EPINEPHRINE 0.5% -1:200000 IJ SOLN
INTRAMUSCULAR | Status: DC | PRN
  Administered 2018-01-27: 8 mL
  Administered 2018-01-27: 7 mL

## 2018-01-27 MED ORDER — BUPIVACAINE-EPINEPHRINE (PF) 0.5% -1:200000 IJ SOLN
INTRAMUSCULAR | Status: AC
  Filled 2018-01-27: qty 30

## 2018-01-27 MED ORDER — SODIUM CHLORIDE 0.9% FLUSH: 3.0000 mL | INTRAVENOUS | Status: DC | PRN

## 2018-01-27 MED ORDER — BACITRACIN 50000 UNITS IM SOLR
INTRAMUSCULAR | Status: AC
  Filled 2018-01-27: qty 1

## 2018-01-27 MED ORDER — FENTANYL CITRATE (PF) 100 MCG/2ML IJ SOLN
25.0000 ug | INTRAMUSCULAR | Status: AC | PRN
  Administered 2018-01-27 (×6): 25 ug via INTRAVENOUS

## 2018-01-27 MED ORDER — ACETAMINOPHEN 650 MG RE SUPP: 650.0000 mg | RECTAL | Status: DC | PRN

## 2018-01-27 MED ORDER — FUROSEMIDE 20 MG PO TABS
20.0000 mg | ORAL_TABLET | Freq: Every day | ORAL | Status: DC
  Administered 2018-01-28 – 2018-01-29 (×2): 20 mg via ORAL
  Filled 2018-01-27 (×2): qty 1

## 2018-01-27 MED ORDER — ACETAMINOPHEN 325 MG PO TABS
650.0000 mg | ORAL_TABLET | ORAL | Status: DC | PRN
  Filled 2018-01-27: qty 2

## 2018-01-27 MED ORDER — SODIUM CHLORIDE 0.9% FLUSH
3.0000 mL | Freq: Two times a day (BID) | INTRAVENOUS | Status: DC
  Administered 2018-01-27 – 2018-01-28 (×2): 3 mL via INTRAVENOUS

## 2018-01-27 MED ORDER — THROMBIN 5000 UNITS EX SOLR
CUTANEOUS | Status: AC
  Filled 2018-01-27: qty 5000

## 2018-01-27 MED ORDER — BUPIVACAINE HCL (PF) 0.5 % IJ SOLN
INTRAMUSCULAR | Status: AC
  Filled 2018-01-27: qty 30

## 2018-01-27 MED ORDER — METHOCARBAMOL 750 MG PO TABS: 750.0000 mg | ORAL_TABLET | Freq: Four times a day (QID) | ORAL | Status: DC | PRN

## 2018-01-27 MED ORDER — THROMBIN 5000 UNITS EX SOLR
CUTANEOUS | Status: DC | PRN
  Administered 2018-01-27: 5000 [IU] via TOPICAL

## 2018-01-27 MED ORDER — SENNOSIDES-DOCUSATE SODIUM 8.6-50 MG PO TABS
1.0000 | ORAL_TABLET | Freq: Every evening | ORAL | Status: DC | PRN
  Administered 2018-01-29: 1 via ORAL
  Filled 2018-01-27: qty 1

## 2018-01-27 MED ORDER — SODIUM CHLORIDE 0.9 % IV SOLN
INTRAVENOUS | Status: DC
  Administered 2018-01-27: 19:00:00 via INTRAVENOUS

## 2018-01-27 MED ORDER — VANCOMYCIN HCL 10 G IV SOLR
1500.0000 mg | INTRAVENOUS | Status: AC
  Administered 2018-01-27: 1500 mg via INTRAVENOUS
  Filled 2018-01-27: qty 1500

## 2018-01-27 MED ORDER — SODIUM CHLORIDE FLUSH 0.9 % IV SOLN
INTRAVENOUS | Status: AC
  Filled 2018-01-27: qty 10

## 2018-01-27 MED ORDER — FENTANYL CITRATE (PF) 100 MCG/2ML IJ SOLN
INTRAMUSCULAR | Status: DC | PRN
  Administered 2018-01-27 (×6): 50 ug via INTRAVENOUS
  Administered 2018-01-27: 25 ug via INTRAVENOUS
  Administered 2018-01-27: 50 ug via INTRAVENOUS
  Administered 2018-01-27: 25 ug via INTRAVENOUS

## 2018-01-27 MED ORDER — CELECOXIB 100 MG PO CAPS
100.0000 mg | ORAL_CAPSULE | Freq: Two times a day (BID) | ORAL | Status: DC
  Administered 2018-01-27: 100 mg via ORAL
  Filled 2018-01-27 (×2): qty 1

## 2018-01-27 MED ORDER — FENTANYL CITRATE (PF) 100 MCG/2ML IJ SOLN
INTRAMUSCULAR | Status: AC
  Filled 2018-01-27: qty 2

## 2018-01-27 MED ORDER — LACTATED RINGERS IV SOLN
INTRAVENOUS | Status: DC
  Administered 2018-01-27 (×2): via INTRAVENOUS

## 2018-01-27 MED ORDER — SODIUM CHLORIDE 0.9 % IV SOLN
INTRAVENOUS | Status: DC | PRN
  Administered 2018-01-27: 40 mL

## 2018-01-27 MED ORDER — PROPOFOL 10 MG/ML IV BOLUS
INTRAVENOUS | Status: DC | PRN
  Administered 2018-01-27 (×2): 50 mg via INTRAVENOUS
  Administered 2018-01-27: 200 mg via INTRAVENOUS

## 2018-01-27 MED ORDER — ONDANSETRON HCL 4 MG PO TABS: 4.0000 mg | ORAL_TABLET | Freq: Four times a day (QID) | ORAL | Status: DC | PRN

## 2018-01-27 MED ORDER — ONDANSETRON HCL 4 MG/2ML IJ SOLN: 4.0000 mg | Freq: Once | INTRAMUSCULAR | Status: DC | PRN

## 2018-01-27 MED ORDER — CEFAZOLIN SODIUM-DEXTROSE 2-4 GM/100ML-% IV SOLN
2.0000 g | Freq: Once | INTRAVENOUS | Status: AC
  Administered 2018-01-27: 2 g via INTRAVENOUS

## 2018-01-27 MED ORDER — HYDROMORPHONE HCL 1 MG/ML IJ SOLN
0.5000 mg | INTRAMUSCULAR | Status: DC | PRN
  Administered 2018-01-27 – 2018-01-28 (×2): 0.5 mg via INTRAVENOUS
  Filled 2018-01-27 (×2): qty 1

## 2018-01-27 MED ORDER — SODIUM CHLORIDE 0.9 % IV SOLN: 250.0000 mL | INTRAVENOUS | Status: DC

## 2018-01-27 MED ORDER — LIDOCAINE HCL (CARDIAC) PF 100 MG/5ML IV SOSY
PREFILLED_SYRINGE | INTRAVENOUS | Status: DC | PRN
  Administered 2018-01-27: 60 mg via INTRAVENOUS

## 2018-01-27 MED ORDER — PHENOL 1.4 % MT LIQD
1.0000 | OROMUCOSAL | Status: DC | PRN
  Filled 2018-01-27: qty 177

## 2018-01-27 MED ORDER — CEFAZOLIN SODIUM-DEXTROSE 2-4 GM/100ML-% IV SOLN
INTRAVENOUS | Status: AC
  Filled 2018-01-27: qty 100

## 2018-01-27 MED ORDER — ALBUTEROL SULFATE (2.5 MG/3ML) 0.083% IN NEBU: 2.5000 mg | INHALATION_SOLUTION | Freq: Four times a day (QID) | RESPIRATORY_TRACT | Status: DC | PRN

## 2018-01-27 MED ORDER — GLYCOPYRROLATE 0.2 MG/ML IJ SOLN
INTRAMUSCULAR | Status: DC | PRN
  Administered 2018-01-27: 0.2 mg via INTRAVENOUS

## 2018-01-27 MED ORDER — METHOCARBAMOL 1000 MG/10ML IJ SOLN
500.0000 mg | Freq: Four times a day (QID) | INTRAVENOUS | Status: DC
  Filled 2018-01-27 (×10): qty 5

## 2018-01-27 MED ORDER — ACETAMINOPHEN 10 MG/ML IV SOLN
INTRAVENOUS | Status: AC
  Filled 2018-01-27: qty 100

## 2018-01-27 MED ORDER — DOXYCYCLINE HYCLATE 100 MG PO TABS
100.0000 mg | ORAL_TABLET | Freq: Two times a day (BID) | ORAL | Status: DC
  Administered 2018-01-28 – 2018-01-29 (×3): 100 mg via ORAL
  Filled 2018-01-27 (×4): qty 1

## 2018-01-27 MED ORDER — ONDANSETRON HCL 4 MG/2ML IJ SOLN: 4.0000 mg | Freq: Four times a day (QID) | INTRAMUSCULAR | Status: DC | PRN

## 2018-01-27 MED ORDER — DEXAMETHASONE SODIUM PHOSPHATE 10 MG/ML IJ SOLN
INTRAMUSCULAR | Status: DC | PRN
  Administered 2018-01-27: 10 mg via INTRAVENOUS

## 2018-01-27 MED ORDER — HYDROMORPHONE HCL 1 MG/ML IJ SOLN
INTRAMUSCULAR | Status: AC
  Filled 2018-01-27: qty 1

## 2018-01-27 MED ORDER — ACETAMINOPHEN 500 MG PO TABS
1000.0000 mg | ORAL_TABLET | Freq: Four times a day (QID) | ORAL | Status: AC
  Administered 2018-01-27 – 2018-01-28 (×3): 1000 mg via ORAL
  Filled 2018-01-27 (×4): qty 2

## 2018-01-27 MED ORDER — MIDAZOLAM HCL 2 MG/2ML IJ SOLN
INTRAMUSCULAR | Status: AC
  Filled 2018-01-27: qty 2

## 2018-01-27 MED ORDER — DIPHENHYDRAMINE HCL 25 MG PO CAPS: 25.0000 mg | ORAL_CAPSULE | Freq: Four times a day (QID) | ORAL | Status: DC | PRN

## 2018-01-27 MED ORDER — MIDAZOLAM HCL 2 MG/2ML IJ SOLN
INTRAMUSCULAR | Status: DC | PRN
  Administered 2018-01-27: 2 mg via INTRAVENOUS

## 2018-01-27 MED ORDER — METHOCARBAMOL 500 MG PO TABS
750.0000 mg | ORAL_TABLET | Freq: Four times a day (QID) | ORAL | Status: DC
  Administered 2018-01-27 – 2018-01-28 (×3): 750 mg via ORAL
  Filled 2018-01-27: qty 1
  Filled 2018-01-27 (×3): qty 2

## 2018-01-27 MED ORDER — BUPIVACAINE HCL (PF) 0.5 % IJ SOLN
INTRAMUSCULAR | Status: DC | PRN
  Administered 2018-01-27: 20 mL

## 2018-01-27 MED ORDER — ONDANSETRON HCL 4 MG/2ML IJ SOLN
INTRAMUSCULAR | Status: DC | PRN
  Administered 2018-01-27: 4 mg via INTRAVENOUS

## 2018-01-27 MED ORDER — FAMOTIDINE 20 MG PO TABS
20.0000 mg | ORAL_TABLET | Freq: Once | ORAL | Status: AC
  Administered 2018-01-27: 20 mg via ORAL

## 2018-01-27 MED ORDER — METHOCARBAMOL 1000 MG/10ML IJ SOLN: 500.0000 mg | Freq: Four times a day (QID) | INTRAVENOUS | Status: DC | PRN

## 2018-01-27 MED ORDER — OXYCODONE HCL 5 MG PO TABS
10.0000 mg | ORAL_TABLET | ORAL | Status: DC | PRN
  Administered 2018-01-27 – 2018-01-29 (×10): 10 mg via ORAL
  Filled 2018-01-27 (×11): qty 2

## 2018-01-27 SURGICAL SUPPLY — 74 items
ADH SKN CLS APL DERMABOND .7 (GAUZE/BANDAGES/DRESSINGS) ×2
AGENT HMST MTR 8 SURGIFLO (HEMOSTASIS) ×2
BUR NEURO DRILL SOFT 3.0X3.8M (BURR) ×4 IMPLANT
CAGE MODULUS XLW 8X22X50 - 10 (Cage) ×1 IMPLANT
CANISTER SUCT 1200ML W/VALVE (MISCELLANEOUS) ×4 IMPLANT
CHLORAPREP W/TINT 26ML (MISCELLANEOUS) ×8 IMPLANT
CORD BIP STRL DISP 12FT (MISCELLANEOUS) ×2 IMPLANT
COUNTER NEEDLE 20/40 LG (NEEDLE) ×2 IMPLANT
COVER LIGHT HANDLE STERIS (MISCELLANEOUS) ×8 IMPLANT
CRADLE LAMINECT ARM (MISCELLANEOUS) ×4 IMPLANT
CUP MEDICINE 2OZ PLAST GRAD ST (MISCELLANEOUS) ×2 IMPLANT
DERMABOND ADVANCED (GAUZE/BANDAGES/DRESSINGS) ×2
DERMABOND ADVANCED .7 DNX12 (GAUZE/BANDAGES/DRESSINGS) ×2 IMPLANT
DRAPE C-ARM 42X72 X-RAY (DRAPES) ×8 IMPLANT
DRAPE C-ARMOR (DRAPES) ×4 IMPLANT
DRAPE INCISE IOBAN 66X45 STRL (DRAPES) ×2 IMPLANT
DRAPE LAPAROTOMY 100X77 ABD (DRAPES) ×4 IMPLANT
DRAPE MICROSCOPE SPINE 48X150 (DRAPES) ×2 IMPLANT
DRAPE POUCH INSTRU U-SHP 10X18 (DRAPES) ×2 IMPLANT
DRAPE SURG 17X11 SM STRL (DRAPES) ×16 IMPLANT
DRSG OPSITE POSTOP 4X6 (GAUZE/BANDAGES/DRESSINGS) IMPLANT
ELECT CAUTERY BLADE TIP 2.5 (TIP) ×2
ELECT EZSTD 165MM 6.5IN (MISCELLANEOUS) ×2
ELECT REM PT RETURN 9FT ADLT (ELECTROSURGICAL) ×4
ELECTRODE CAUTERY BLDE TIP 2.5 (TIP) ×1 IMPLANT
ELECTRODE EZSTD 165MM 6.5IN (MISCELLANEOUS) ×1 IMPLANT
ELECTRODE REM PT RTRN 9FT ADLT (ELECTROSURGICAL) ×2 IMPLANT
FEE INTRAOP MONITOR IMPULS NCS (MISCELLANEOUS) IMPLANT
FRAME EYE SHIELD (PROTECTIVE WEAR) ×4 IMPLANT
GLOVE BIO SURGEON STRL SZ 6.5 (GLOVE) ×8 IMPLANT
GLOVE BIO SURGEON STRL SZ7.5 (GLOVE) ×1 IMPLANT
GLOVE BIOGEL PI IND STRL 7.0 (GLOVE) ×2 IMPLANT
GLOVE BIOGEL PI INDICATOR 7.0 (GLOVE) ×2
GLOVE SURG SYN 8.5  E (GLOVE) ×6
GLOVE SURG SYN 8.5 E (GLOVE) ×6 IMPLANT
GLOVE SURG SYN 8.5 PF PI (GLOVE) ×6 IMPLANT
GOWN SRG XL LVL 3 NONREINFORCE (GOWNS) ×2 IMPLANT
GOWN STRL NON-REIN TWL XL LVL3 (GOWNS) ×4
GOWN STRL REUS W/TWL MED LVL3 (GOWN DISPOSABLE) ×4 IMPLANT
GRADUATE 1200CC STRL 31836 (MISCELLANEOUS) ×2 IMPLANT
GUIDEWIRE NITINOL BEVEL TIP (WIRE) ×4 IMPLANT
INTRAOP MONITOR FEE IMPULS NCS (MISCELLANEOUS)
INTRAOP MONITOR FEE IMPULSE (MISCELLANEOUS)
KIT DILATOR XLIF 5 (KITS) ×1 IMPLANT
KIT NDL NVM5 EMG ELECT (KITS) IMPLANT
KIT NEEDLE NVM5 EMG ELECT (KITS) ×2 IMPLANT
KIT SPINAL PRONEVIEW (KITS) ×2 IMPLANT
KIT SURGICAL ACCESS MAXCESS 4 (KITS) ×1 IMPLANT
KIT TURNOVER KIT A (KITS) ×2 IMPLANT
KNIFE BAYONET SHORT DISCETOMY (MISCELLANEOUS) IMPLANT
MARKER SKIN DUAL TIP RULER LAB (MISCELLANEOUS) ×4 IMPLANT
NDL I PASS (NEEDLE) IMPLANT
NDL SAFETY ECLIPSE 18X1.5 (NEEDLE) ×2 IMPLANT
NEEDLE HYPO 18GX1.5 SHARP (NEEDLE) ×4
NEEDLE HYPO 22GX1.5 SAFETY (NEEDLE) ×2 IMPLANT
NEEDLE I PASS (NEEDLE) ×2 IMPLANT
PACK LAMINECTOMY NEURO (CUSTOM PROCEDURE TRAY) ×2 IMPLANT
PAD ARMBOARD 7.5X6 YLW CONV (MISCELLANEOUS) ×2 IMPLANT
PENCIL ELECTRO HAND CTR (MISCELLANEOUS) ×2 IMPLANT
PUTTY DBM PROPEL MEDIUM (Putty) ×1 IMPLANT
ROD RELINE MAS LORD 5.5X45MM (Rod) ×2 IMPLANT
SCREW LOCK RELINE 5.5 TULIP (Screw) ×4 IMPLANT
SCREW RELINE RED 6.5X50MM POLY (Screw) ×4 IMPLANT
SPOGE SURGIFLO 8M (HEMOSTASIS) ×2
SPONGE SURGIFLO 8M (HEMOSTASIS) ×2 IMPLANT
SUT DVC VLOC 3-0 CL 6 P-12 (SUTURE) ×2 IMPLANT
SUT VIC AB 0 CT1 27 (SUTURE) ×2
SUT VIC AB 0 CT1 27XCR 8 STRN (SUTURE) ×1 IMPLANT
SUT VIC AB 2-0 CT1 18 (SUTURE) ×2 IMPLANT
SYR 30ML LL (SYRINGE) ×4 IMPLANT
TOWEL OR 17X26 4PK STRL BLUE (TOWEL DISPOSABLE) ×7 IMPLANT
TRAY FOLEY MTR SLVR 16FR STAT (SET/KITS/TRAYS/PACK) IMPLANT
TUBE METRX 18MMX5CM (INSTRUMENTS) ×1 IMPLANT
TUBING CONNECTING 10 (TUBING) ×6 IMPLANT

## 2018-01-27 NOTE — Progress Notes (Signed)
Procedure L1-L2 XLIF Procedure Date: 01/27/2018 Diagnosis: Neurogenic claudication   History: [name] is POD1  L1-L2 XLIF. Recovering fairly well but complains of 9/10 back pain and left hip pain. Denies lower extremity symptoms including pain/numbness/tingling/weakness.  Voiding and eating without issue.   Physical Exam: Vitals:   01/27/18 1157  BP: 127/89  Pulse: 91  Resp: 18  Temp: 98.4 F (36.9 C)  SpO2: 95%    AA Ox3 CNI Skin: incision sites (4) intact - 3 back, 1 left lateral. Lumbar incisions dressed with honeycomb bandage. Bandage is clean and dry. Incision and glue intact left lateral incision.  Strength:5/5 throughout (some pain limited weakness in left IS) Sensation: intact and symmetric  Data:  No results for input(s): NA, K, CL, CO2, BUN, CREATININE, LABGLOM, GLUCOSE, CALCIUM in the last 168 hours. No results for input(s): AST, ALT, ALKPHOS in the last 168 hours.  Invalid input(s): TBILI   No results for input(s): WBC, HGB, HCT, PLT in the last 168 hours. Recent Labs  Lab 01/27/18 1157  INR 0.87         Other tests/results:  Lumbar xray results pending  Assessment/Plan:  CELESTER LECH is POD1 s/p L1-2 XLIF for neurogenic claudication. Pain control with pain medication, tylenol, robaxin, and toradol 30mg  initial dose followed by 15mg  every 6 hours.  D/c celebrex Increased robaxin to 1000mg  Will continue to monitor  - mobilize - pain control - DVT prophylaxis -PTOT  Marin Olp PA-C Department of Neurosurgery

## 2018-01-27 NOTE — Anesthesia Post-op Follow-up Note (Signed)
Anesthesia QCDR form completed.        

## 2018-01-27 NOTE — Anesthesia Postprocedure Evaluation (Signed)
Anesthesia Post Note  Patient: [name]  Procedure(s) Performed: POSTERIOR LUMBAR FUSION 1 LEVEL-L1-2 (N/A Back)  Patient location during evaluation: PACU Anesthesia Type: General Level of consciousness: awake and alert Pain management: pain level controlled Vital Signs Assessment: post-procedure vital signs reviewed and stable Respiratory status: spontaneous breathing, nonlabored ventilation, respiratory function stable and patient connected to nasal cannula oxygen Cardiovascular status: blood pressure returned to baseline and stable Postop Assessment: no apparent nausea or vomiting Anesthetic complications: no     Last Vitals:  Vitals:   01/27/18 1910 01/27/18 1955  BP: (!) 145/90 (!) 151/84  Pulse: 78 (!) 102  Resp: 20 19  Temp: 36.8 C 37 C  SpO2: 100% 95%    Last Pain:  Vitals:   01/27/18 2007  TempSrc:   PainSc: [place]

## 2018-01-27 NOTE — Progress Notes (Signed)
Pt with pain meds on board, continues to complain of pain but desats, encouraged to cough and take deep breaths, will monitor for now.

## 2018-01-27 NOTE — H&P (Signed)
History of Present Illness: 01/27/2018 I have confirmed the details below and plan for surgery today.  12/10/2017  [name] returns to see me. Since her last visit, he has began having worsening instability in his legs. He has worsening symptoms when he walks around. He feels like he is getting weak when he walks and feels unsteady on his feet.  11/12/2017 [name] is here today with a chief complaint of low back pain, right buttock pain  Duration: years, worsening since October 2018 Location: primarily around R flank and hip, and upper L spine Quality: stabbing, popping Severity: 9  Precipitating: aggravated by getting up from a seated position, walking, bending, most activities Modifying factors: made better by sitting upright Weakness: none Timing: not specific Bowel/Bladder Dysfunction: none  Conservative measures:  Physical therapy: has not tried Multimodal medical therapy including regular antiinflammatories: norco, methocarbamol, tramadol, tizanidine, steroid taper  Injections: has tried epidural steroid injections, previously provided some relief, but no longer providing relief 10/21/17: Right L4-5 TFESI 09/29/17: Right L4-5 TFESI August 2018: bilateral L4-5 and L5-S1 facet joint injections at [place]  Past Surgery: denies  [name] has no symptoms of cervical myelopathy.  The symptoms are causing a significant impact on the patient's life.   Of note, [name] was seen in the hospital just last weekend and diagnosed with deep venous thrombosis and thrombophlebitis. He was placed on therapeutic anticoagulation at that time as well as antibiotics.  Review of Systems:  A 10 point review of systems is negative, except for the pertinent positives and negatives detailed in the HPI.  Past Medical History: Past Medical History:  Diagnosis Date  . Adrenal adenoma, right  . Anxiety attack  . Bipolar affective (CMS-HCC)  . BPH (benign prostatic  hyperplasia)  . Colitis, acute, unspecified  . DDD (degenerative disc disease), lumbar  . Depression  . Diverticula, colon  without diverticulitis  . Epiphora of left side  . Family history of prostate cancer  . GERD (gastroesophageal reflux disease)  . Hidradenitis  . History of multiple pulmonary nodules  . History of pneumonia  . Iron deficiency anemia  . Kidney stones  . Mediastinal lymphadenopathy  . Morbid obesity (CMS-HCC)  . OSA (obstructive sleep apnea)  . Osteoarthritis  . Peripheral edema  . Sepsis (CMS-HCC) 08/11/2017  . Sleep apnea  . Urinary frequency   Past Surgical History: Past Surgical History:  Procedure Laterality Date  . ankle surgery  . antral window 1988  . ARTHROPLASTY TOTAL KNEE Left 07/27/2015  . ARTHROPLASTY TOTAL KNEE Right 09/19/2015  . colonscopy  . ENDOBRONCHIAL ULTRASOUND 2017  . ESOPHAGOGASTRODOUDENOSCOPY  . foot surgery 2014  . hydradenitis excision  "several times"  . INGUINAL HERNIA REPAIR 2004  . KNEE ARTHROSCOPY Bilateral 1997  . ORIF tibia fracture left Left 1976  . ROUX-EN-Y PROCEDURE 2003  . TONSILLECTOMY  . umbilical hidradenitis excision 2012  . video bronchoscopy 2017   Allergies: Allergies as of 12/10/2017 - Reviewed 12/10/2017  Allergen Reaction Noted  . Gabapentin Other (See Comments) 12/04/2017  . Sulfa (sulfonamide antibiotics) Itching and Rash 11/21/2010   Medications:  No current facility-administered medications on file prior to encounter.    Current Outpatient Medications on File Prior to Encounter  Medication Sig Dispense Refill  . acetaminophen (TYLENOL) 500 MG tablet Take 1,000 mg by mouth every 8 (eight) hours as needed for mild pain or moderate pain (Usually takes twice daily).     Marland Kitchen albuterol (PROVENTIL HFA;VENTOLIN HFA) 108 (  90 Base) MCG/ACT inhaler Inhale 1-2 puffs into the lungs every 6 (six) hours as needed for wheezing or shortness of breath. 1 Inhaler 11  . ALPRAZolam (XANAX) 1 MG tablet Take 1  mg by mouth 3 (three) times daily as needed for anxiety. Taking 0.5 to 3 mg qd per psychiatry    . baclofen (LIORESAL) 10 MG tablet Take 10 mg by mouth 3 (three) times daily as needed.  0  . doxycycline (VIBRAMYCIN) 100 MG capsule Take 1 capsule (100 mg total) by mouth 2 (two) times daily. 28 capsule 0  . HYDROcodone-acetaminophen (NORCO/VICODIN) 5-325 MG tablet Take 1 tablet by mouth 3 (three) times daily as needed for moderate pain. 15 tablet 0  . methocarbamol (ROBAXIN) 500 MG tablet TAKE 1 2 TO 1 (ONE HALF TO ONE) TABLET BY MOUTH AT BEDTIME AS NEEDED  5  . oxycodone (OXY-IR) 5 MG capsule Take 5 mg by mouth 2 (two) times daily.    Marland Kitchen aspirin EC 81 MG tablet Take 1 tablet (81 mg total) by mouth daily. 30 tablet 4  . diclofenac (VOLTAREN) 75 MG EC tablet Take 1 tablet (75 mg total) by mouth 2 (two) times daily as needed. 180 tablet 1  . furosemide (LASIX) 20 MG tablet Take 1 tablet (20 mg total) by mouth daily. In am 30 tablet 3  . mupirocin ointment (BACTROBAN) 2 % Apply 1 application topically 2 (two) times daily. Right leg (Patient not taking: Reported on 01/27/2018) 30 g 0  . predniSONE (DELTASONE) 10 MG tablet   0  . QUEtiapine (SEROQUEL) 300 MG tablet Take by mouth.    . rivaroxaban (XARELTO) 20 MG TABS tablet Take 1 tablet (20 mg total) by mouth daily with supper. 90 tablet 1  . traMADol (ULTRAM) 50 MG tablet Take 50 mg by mouth daily.   1     Social History: Social History   Tobacco Use  . Smoking status: Current Every Day Smoker  Types: Cigarettes  . Smokeless tobacco: Never Used  . Tobacco comment: 1 pack lasts about 5 days  Substance Use Topics  . Alcohol use: Never  Frequency: Never  . Drug use: Never   Family Medical History: Family History  Problem Relation Age of Onset  . Alzheimer's disease Mother  . Cancer Father  . Heart disease Father  . Prostate cancer Father  . Prostate cancer Brother  . Myocardial Infarction (Heart attack) Paternal Uncle   Physical  Examination: Vitals:   Vitals:   01/27/18 1157  BP: 127/89  Pulse: 91  Resp: 18  Temp: 98.4 F (36.9 C)  SpO2: 95%     General: Patient is well developed, well nourished, calm, collected, and in no apparent distress. Attention to examination is appropriate.  Psychiatric: Patient is non-anxious.  Head: Pupils equal, round, and reactive to light.  ENT: Oral mucosa appears well hydrated.  Neck: Supple. Full range of motion.  Respiratory: Patient is breathing without any difficulty.  Extremities: No edema.  Vascular: Palpable dorsal pedal pulses.  Skin: On exposed skin, there are no abnormal skin lesions.  Heart sounds normal no MRG. Chest Clear to Auscultation Bilaterally.   NEUROLOGICAL:  General: In no acute distress.  Awake, alert, oriented to person, place, and time. Speech is clear and fluent. Fund of knowledge is appropriate.   Cranial Nerves: Pupils equal round and reactive to light. Facial tone is symmetric. Facial sensation is symmetric. Shoulder shrug is symmetric. Tongue protrusion is midline. There is no pronator drift.  ROM of spine: diminished mobility. Palpation of spine: mildly tender.   Strength: Side Biceps Triceps Deltoid Interossei Grip Wrist Ext. Wrist Flex.  R 5 5 5 5 5 5 5   L 5 5 5 5 5 5 5    Side Iliopsoas Quads Hamstring PF DF EHL  R 4 5 5 5 5 5   L 5 5 5 5 5 5    Reflexes are 2+ and symmetric at the biceps, triceps, brachioradialis, patella and achilles. Bilateral upper and lower extremity sensation is intact to light touch. Clonus is not present. Toes are down-going. Gait is antalgic. Hoffman's is absent.  Rapid alternating movements are normal.   He has significant swelling in his left greater than right leg below the knee. He was recently diagnosed with a deep venous thrombosis.  Medical Decision Making  Imaging: MRI L spine 09/04/2017 IMPRESSION: Marked progression of disc degeneration and spurring on the right at L1-2. Given the  above findings, favor degenerative change rather than infection. Correlate with symptoms of infection. There is endplate spurring and disc protrusion right greater than left at L1-2 with mild spinal stenosis and subarticular stenosis right greater than left  Small right paracentral disc protrusion L4-5 is progressed in the interval. Subarticular stenosis bilaterally.  Electronically Signed ByFrederik Pear M.D. On: 09/15/2017 07:33  I have personally reviewed the images and agree with the above interpretation with the exception that I feel he has moderate to severe disease at L1 2.  Assessment and Plan: [name] is a pleasant 57 y.o. 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 He is very symptomatic from this.  Plan for L1-2 XLIF/PSFD.  Risks reviewed.  Meade Maw MD

## 2018-01-27 NOTE — Progress Notes (Signed)
Pts sats 100, able to sleep in intervals, will take to room

## 2018-01-27 NOTE — Anesthesia Preprocedure Evaluation (Addendum)
Anesthesia Evaluation  Patient identified by MRN, date of birth, ID band Patient awake    Reviewed: Allergy & Precautions, NPO status , Patient's Chart, lab work & pertinent test results  History of Anesthesia Complications Negative for: history of anesthetic complications  Airway Mallampati: III       Dental   Pulmonary sleep apnea , neg COPD, Current Smoker,           Cardiovascular (-) hypertension(-) Past MI and (-) CHF (-) dysrhythmias (-) Valvular Problems/Murmurs     Neuro/Psych neg Seizures Anxiety Depression Bipolar Disorder    GI/Hepatic Neg liver ROS, GERD  ,  Endo/Other  neg diabetes  Renal/GU Renal disease (stones)     Musculoskeletal   Abdominal   Peds  Hematology  (+) anemia ,   Anesthesia Other Findings   Reproductive/Obstetrics                            Anesthesia Physical Anesthesia Plan  ASA: III  Anesthesia Plan: General   Post-op Pain Management:    Induction:   PONV Risk Score and Plan: 1 and Dexamethasone and Ondansetron  Airway Management Planned:   Additional Equipment:   Intra-op Plan:   Post-operative Plan:   Informed Consent: I have reviewed the patients History and Physical, chart, labs and discussed the procedure including the risks, benefits and alternatives for the proposed anesthesia with the patient or authorized representative who has indicated his/her understanding and acceptance.     Plan Discussed with:   Anesthesia Plan Comments:         Anesthesia Quick Evaluation

## 2018-01-27 NOTE — Transfer of Care (Signed)
Immediate Anesthesia Transfer of Care Note  Patient: [name]  Procedure(s) Performed: POSTERIOR LUMBAR FUSION 1 LEVEL-L1-2 (N/A Back)  Patient Location: PACU  Anesthesia Type:General  Level of Consciousness: awake and patient cooperative  Airway & Oxygen Therapy: Patient Spontanous Breathing and Patient connected to face mask oxygen  Post-op Assessment: Report given to RN and Post -op Vital signs reviewed and stable  Post vital signs: Reviewed and stable  Last Vitals:  Vitals Value Taken Time  BP 145/94 01/27/2018  5:39 PM  Temp    Pulse 93 01/27/2018  5:43 PM  Resp 14 01/27/2018  5:43 PM  SpO2 96 % 01/27/2018  5:43 PM  Vitals shown include unvalidated device data.  Last Pain:  Vitals:   01/27/18 1157  TempSrc: Oral  PainSc: 0-No pain         Complications: No apparent anesthesia complications

## 2018-01-27 NOTE — Consult Note (Signed)
Pharmacy Antibiotic Note Pharmacy consulted for Vancomycin and Cefazolin Pre-op dosing for [name] a 57 y.o. male with planned surgery on 8/28.   Plan: Pharmacy has ordered Vancomycin 1500mg   (15mg /kg Adj BW) IV x 1  dose to be give 120 minutes Pre-op and cefazolin 2g IV x 1 dose 30 min pre-op.   Adj BW: 98kg  Thank you for allowing pharmacy to be a part of this patient's care.  Pernell Dupre, PharmD, BCPS Clinical Pharmacist 01/27/2018 1:02 AM

## 2018-01-27 NOTE — Addendum Note (Signed)
Addendum  created 01/27/18 2029 by Gunnar Bulla, MD   Intraprocedure Event edited, Sign clinical note

## 2018-01-27 NOTE — Progress Notes (Signed)
Patient admitted to room. Patient educated about safety and room call system. Patient rating pain 8/10. Alert and oriented X 4.

## 2018-01-27 NOTE — Anesthesia Postprocedure Evaluation (Signed)
Anesthesia Post Note  Patient: [name]  Procedure(s) Performed: POSTERIOR LUMBAR FUSION 1 LEVEL-L1-2 (N/A Back)  Anesthesia Type: General     Last Vitals:  Vitals:   01/27/18 1910 01/27/18 1955  BP: (!) 145/90 (!) 151/84  Pulse: 78 (!) 102  Resp: 20 19  Temp: 36.8 C 37 C  SpO2: 100% 95%    Last Pain:  Vitals:   01/27/18 2007  TempSrc:   PainSc: [place]

## 2018-01-27 NOTE — Op Note (Signed)
Indications: [name] is a 57 yo male who presented with neurogenic claudication. He failed conservative management and elected for surgical intervention  Findings: L1-2 stenosis  Preoperative Diagnosis: Neurogenic claudication Postoperative Diagnosis: same   EBL: 200 ml IVF: 1500 ml Drains: none Disposition: Extubated and Stable to PACU Complications: none  A foley catheter was placed.   Preoperative Note:   Risks of surgery discussed include: infection, bleeding, stroke, coma, death, paralysis, CSF leak, nerve/spinal cord injury, numbness, tingling, weakness, complex regional pain syndrome, recurrent stenosis and/or disc herniation, vascular injury, development of instability, neck/back pain, need for further surgery, persistent symptoms, development of deformity, and the risks of anesthesia. The patient understood these risks and agreed to proceed.  NAME OF ANTERIOR PROCEDURE:               1. Anterior lumbar interbody fusion via a left lateral retroperitoneal approach at L1/2 2. Placement of a Lordotic Modulus 8x22x50 interbody cage, filled with Demineralized Bone Matrix    NAME OF POSTERIOR PROCEDURE: 1. Posterior instrumentation using   Nuvasive Reline Instrumentation 2. Posterolateral fusion, L1-2  3. Posterior decompression, L1-2    PROCEDURE:  Patient was brought to the operating room, intubated, turned to the lateral position.  All pressure points were checked and double-checked.  The patient was prepped and draped in the standard fashion. Prior to prepping, fluoroscopy was brought in and the patient was positioned with a large bump under the contralateral side between the iliac crest and rib cage, allowing the area between the iliac crest and the lateral aspect of the rib cage to open and increase the ability to reach inferiorly, to facilitate entry into the disc space.  The incision was marked upon the skin both the location of the disc space as well as the superior most  aspect of the iliac crest.  Based on the identification of the disc space an incision was prepared, marked upon the skin and eventually was used for our lateral incision.  The fluoroscopy was turned into a cross table A/P image in order to confirm that the patient's spine remained in a perpendicular trajectory to the floor without rotation.  Once confirming that all the pressure points were checked and double-checked and the patient remained in sturdy position strapped down in this slightly jack-knifed lateral position, the patient was prepped and draped in standard fashion.  The skin was injected with local anesthetic, then incised until the abdominal wall fascia was noted.  I bluntly dissected posteriorly until we were able to identify the posterior musculature near petit's triangle.  At this point, using primarily blunt dissection with our finger aided with a metzenbaum scissor, were able to enter the retroperitoneal cavity.  The retroperitoneal potential space was opened further until palpating out the psoas muscle, the medial aspect of the iliac crest, the medial aspect of the last rib and continued to define the retroperitoneal space with blunt dissection in order to facilitate safe placement of our dilators.    While protecting by dissecting directly onto a finger in the retroperitoneum, the retroperitoneal space was entered safely from the lateral incision and the initial dilator placed onto the muscle belly of the psoas.  While directly stimulating the dilator and after radiographically confirming our location relative to the disc space, I placed the dilator through the psoas.  The dilators were stimulated to ensure remaining safely away from any of the lumbar plexus nerves; the dilators were repositioned until no pathologic stimulation was appreciated.  Once I had  confirmed the location of our initial dilator radiographically, a K-wire secured the dilator into the L1/2 disc space and confirmed position  under A/P and lateral fluoroscopy.  At this point, I dilated up with direct stimulation to confirm lack of pathologic stimulation.  Once all the dilators were in position, I placed in the retractor and secured it onto the table, locked into position and confirmed under A/P and lateral fluoroscopy to confirm our approach angle to the disc space as well as location relative to the disc space.  I then placed the muscle stimulator in through the working channel down to the vertebral body, stimulating the entire lateral surface of the vertebral body and any of the visualized psoas muscle that was adjacent to the retractor, confirming again the safe passage to the psoas before we began performing the discectomy.  At this point, we began our discectomy at L1/2.  The disc was incised laterally throughout the extent of our exposure. Using a combination of pituitary rongeurs, Kerrison rongeurs, rasps, curettes of various sorts, we were able to begin to clean out the disc space.  Once we had cleaned out the majority of the disc space, we then cut the lateral annulus with a cob, breaking the lateral annual attachments on the contralateral side by subtly working the cob through the annulus while using flouroscopy.  Care was taken not to extend further than required after cutting the annular attachments.  After this had been performed, we prepared the endplates for placement of our graft, sized a graft to the disc space by serially dilating up in trial sizes until we confirmed that our graft would be well positioned, allowing distraction while maintaining good grip.  This was confirmed under A/P and lateral fluoroscopy in order to ensure its placement as an eventual trial for placement of our final graft.  We irrigated with bacteriostatic saline.  Once confirmed placement, the Modulus implant filled with allograft was impacted into position at L1/2.   Through a combination of intradiscal distraction and anterior releasing,  we were able to correct the anterior deformity during disc preparation and placement of the graft.      At this point, final radiographs were performed, and we began closure.  The wound was closed using 0 Vicryl interrupted suture in the fascia and 2-0 Vicryl inverted suture were placed in the subcutaneous tissue and dermis. 3-0 monocryl was used for final closure. Dermabond was used to close the skin.    After closing the anterior part in layers, the patient was repositioned into prone position.  All pressure points were checked and double-checked and we brought in fluoroscopy to confirm our approach angles for putting in percutaneous pedicle screws.  The pedicles were marked using true AP flouroscopy, adjusting the angle at each level.  We then prepped and draped the patient in the standard fashion.  At this point, incisions were made for placing percutaneous pedicle screw instrumentation at L1.  Starting at L1, a Jamsheedi needle was used to cannulate the pedicle bilaterally using AP flouroscopy. Direct stimulation was used on the needle without any low (<15 mAmp) stimulation thresholds. After cannulation of the pedicle to 30 mm, a K-wire was placed through the [place] approximately and secured.   Using a similar technique, the pedicles at L1-L2 were cannulated and K wires secured. The K wires were then checked using lateral flouroscopy to ensure placement into the vertebral bodies. After confirming placement of K wires, cannulated pedicle screws were introduced over the K wires  at each level.  After advancing each screw into the vertebral body approximately 25-30 mm, the K wire was removed.At each level, 6.5x96mm Nuvasive Reline pedicle screws were placed under lateral flouroscopy. Once the screws were placed, the screw extensions were then linked, a path was formed for the rod and a rod was utilized to connect the screws.  We then compressed, torqued / counter-torqued and removed the screw assembly.  Once performed on each side, confirmatory AP and lateral x-rays were taken and the case was completed.   Again we confirmed radiographically and began our closure.    We then moved to L1-2 decompression. The metrx tubes were sequentially advanced and confirmed in position. An 85mm by 19mm tube was locked in place to the bed side attachment.  Fluoroscopy was then removed from the field.  The microscope was then sterilely brought into the field and muscle creep was hemostased with a bipolar and resected with a pituitary rongeur.  A Bovie extender was then used to expose the spinous process and lamina.  Careful attention was placed to not violate the facet capsule. A 3 mm matchstick drill bit was then used to make a hemi-laminotomy trough until the ligamentum flavum was exposed.  This was extended to the base of the spinous process and to the contralateral side to remove all the central bone from each side.  Once this was complete and the underlying ligamentum flavum was visualized, it was dissected with a curette and resected with Kerrison rongeurs.  Extensive ligamentum hypertrophy was noted, requiring a substantial amount of time and care for removal.  The dura was identified and palpated. The kerrison rongeur was then used to remove the medial facet bilaterally until no compression was noted.  A balltip probe was used to confirm decompression of the right L2 nerve root.  Additional attention was paid to completion of the contralateral L1-2 foraminotomy until the left L2 nerve root was completely free.  Once this was complete, L1-2 central decompression including medial facetectomy and foraminotomy was confirmed and decompression on both sides was confirmed. No CSF leak was noted.  The right L1-2 facet joint was visualized, drilled, and decorticated for posterolateral arthrodesis.      The tube system was then removed under microscopic visualization and hemostasis was obtained with a bipolar.     The  wound was closed using 0 Vicryl interrupted suture in the fascia, 2-0 Vicryl inverted suture were placed in the subcutaneous tissue and dermis. 3-0 monocryl was used for final closure. Dermabond was used to close the skin.    Needle, lap and all counts were correct at the end of the case.     There was no pathologic change in the neuromonitoring during the procedure.   Marin Olp PA assisted in the entire procedure.  Meade Maw MD Neurosurgery

## 2018-01-27 NOTE — Anesthesia Procedure Notes (Signed)
Procedure Name: Intubation Date/Time: 01/27/2018 1:48 PM Performed by: Faylene Kurtz, RN Pre-anesthesia Checklist: Patient identified, Emergency Drugs available, Suction available, Patient being monitored and Timeout performed Patient Re-evaluated:Patient Re-evaluated prior to induction Oxygen Delivery Method: Circle system utilized Preoxygenation: Pre-oxygenation with 100% oxygen Induction Type: IV induction Ventilation: Mask ventilation without difficulty Laryngoscope Size: McGraph and 4 (attempts with Miller 3 and MAC 4 - unable to visualize cords) Grade View: Grade I Tube type: Oral Tube size: 7.5 mm Number of attempts: 4 (2xSRNA, 1xCRNA, Successful per [place] on [place]) Airway Equipment and Method: Stylet,  Video-laryngoscopy and LTA kit utilized Placement Confirmation: positive ETCO2 and breath sounds checked- equal and bilateral Secured at: 21 cm Tube secured with: Tape Dental Injury: Teeth and Oropharynx as per pre-operative assessment  Difficulty Due To: Difficulty was unanticipated and Difficult Airway- due to anterior larynx Future Recommendations: Recommend- induction with short-acting agent, and alternative techniques readily available

## 2018-01-28 ENCOUNTER — Inpatient Hospital Stay: Payer: 59

## 2018-01-28 LAB — GLUCOSE, CAPILLARY: Glucose-Capillary: 197 mg/dL — ABNORMAL HIGH (ref 70–99)

## 2018-01-28 MED ORDER — KETOROLAC TROMETHAMINE 15 MG/ML IJ SOLN
15.0000 mg | Freq: Four times a day (QID) | INTRAMUSCULAR | Status: DC
  Administered 2018-01-28 – 2018-01-29 (×3): 15 mg via INTRAVENOUS
  Filled 2018-01-28 (×3): qty 1

## 2018-01-28 MED ORDER — METHOCARBAMOL 1000 MG/10ML IJ SOLN
500.0000 mg | Freq: Four times a day (QID) | INTRAVENOUS | Status: DC
  Filled 2018-01-28: qty 5

## 2018-01-28 MED ORDER — KETOROLAC TROMETHAMINE 30 MG/ML IJ SOLN
30.0000 mg | Freq: Once | INTRAMUSCULAR | Status: AC
  Administered 2018-01-28: 30 mg via INTRAMUSCULAR
  Filled 2018-01-28: qty 1

## 2018-01-28 MED ORDER — METHOCARBAMOL 500 MG PO TABS
1000.0000 mg | ORAL_TABLET | Freq: Four times a day (QID) | ORAL | Status: DC
  Administered 2018-01-28 – 2018-01-29 (×5): 1000 mg via ORAL
  Filled 2018-01-28 (×5): qty 2

## 2018-01-28 NOTE — Progress Notes (Signed)
IS education complete, pt understands technique and reason for use. Pt inhales >2053ml, pt independent with use

## 2018-01-28 NOTE — Plan of Care (Signed)
  Problem: Education: Goal: Knowledge of General Education information will improve Description Including pain rating scale, medication(s)/side effects and non-pharmacologic comfort measures Outcome: Progressing   Problem: Clinical Measurements: Goal: Ability to maintain clinical measurements within normal limits will improve Outcome: Progressing Goal: Will remain free from infection Outcome: Progressing Goal: Diagnostic test results will improve Outcome: Progressing Goal: Respiratory complications will improve Outcome: Progressing Goal: Cardiovascular complication will be avoided Outcome: Progressing   Problem: Health Behavior/Discharge Planning: Goal: Ability to manage health-related needs will improve Outcome: Progressing   Problem: Activity: Goal: Risk for activity intolerance will decrease Outcome: Progressing   Problem: Nutrition: Goal: Adequate nutrition will be maintained Outcome: Progressing   Problem: Coping: Goal: Level of anxiety will decrease Outcome: Progressing

## 2018-01-28 NOTE — Evaluation (Signed)
Occupational Therapy Evaluation Patient Details Name: [name] MRN: [id number] DOB: [birth year] Today's Date: 01/28/2018    History of Present Illness 57yo male POD#1 s/p L1-L2 XLIF. PMHx includes bioplar, anxiety, OA, DDD, L TKA (07/2015), R TKA (09/2015)   Clinical Impression   Pt seen for OT evaluation this date, POD#1 from L1-L2 XLIF. Prior to hospital admission, pt was independent with mobility, ADL, and IADL, however increasingly limited 2/2 lower back pain and weakness in BLE. However, has been having increased difficulty with all aspects of mobility and ADL due to back pain. Pt lives alone in a house he rents from his niece with no steps to enter and a tub shower. Pt endorses having some family who live locally who might be able to provide limited help. Currently pt is very pain limited, 8/10 at rest in recliner quickly increasing to 10/10 with any repositioning in the recliner. Pt requires mod assist for LB ADL tasks 2/2 pain. Pt educated in back precautions with handout provided, self care skills, bed mobility and functional transfer training, AE/DME for bathing, dressing, and toileting needs, home/routines modifications, and falls prevention strategies to maximize safety and functional independence while minimizing falls risk and maintaining back precautions. Pt verbalized understanding of all education/training provided. Handout provided to support recall and carry over of learned precautions/techniques for bed mobility, functional transfers, and self care skills. Pt will benefit from skilled OT services to address noted impairments and functional deficits in order to maximize return to PLOF with less pain and improved function. Recommend HHOT services upon discharge.     Follow Up Recommendations  Home health OT;Supervision - Intermittent    Equipment Recommendations  Toilet riser;3 in 1 bedside commode    Recommendations for Other Services       Precautions / Restrictions  Precautions Precautions: Fall;Back Precaution Booklet Issued: Yes (comment) Precaution Comments: no bending, arching, twisting, no lifting >10 lbs; no brace Restrictions Weight Bearing Restrictions: No      Mobility Bed Mobility               General bed mobility comments: deferred, up in recliner  Transfers                 General transfer comment: deferred, 2/2 to significant pain, RN notified    Balance                                           ADL either performed or assessed with clinical judgement   ADL Overall ADL's : Needs assistance/impaired Eating/Feeding: Sitting;Independent   Grooming: Sitting;Supervision/safety   Upper Body Bathing: Sitting;Supervision/ safety   Lower Body Bathing: Sit to/from stand;Moderate assistance   Upper Body Dressing : Sitting;Supervision/safety   Lower Body Dressing: Sit to/from stand;Moderate assistance                       Vision Baseline Vision/History: Wears glasses Wears Glasses: At all times Patient Visual Report: No change from baseline       Perception     Praxis      Pertinent Vitals/Pain Pain Assessment: 0-10 Pain Score: 10-Worst pain ever Pain Location: 8-10/10 low back/incision site pain that shoots down LLE>RLE Pain Descriptors / Indicators: Sharp;Shooting;Grimacing;Guarding Pain Intervention(s): Limited activity within patient's tolerance;Monitored during session;Repositioned;Patient requesting pain meds-RN notified     Hand Dominance     Extremity/Trunk Assessment  Upper Extremity Assessment Upper Extremity Assessment: Overall WFL for tasks assessed   Lower Extremity Assessment Lower Extremity Assessment: Defer to PT evaluation(difficult to formally assess 2/2 LBP)   Cervical / Trunk Assessment Cervical / Trunk Assessment: Normal   Communication Communication Communication: No difficulties   Cognition Arousal/Alertness: Awake/alert Behavior During  Therapy: WFL for tasks assessed/performed Overall Cognitive Status: Within Functional Limits for tasks assessed                                     General Comments       Exercises Other Exercises Other Exercises: Pt educated in precautions handout . Other Exercises: Pt educated in car transfer techniques to maintain hip precautions. Other Exercises: Pt educated in cognitive behavioral strategies for improved self mgt of pain Other Exercises: Pt educated in use of toilet riser with handrails overtop his toilet to improve safety and minimize pain with toilet transfers. Other Exercises: Pt educated in ways to elevate his bed to improve bed mobility techniques and maximize safety.    Shoulder Instructions      Home Living Family/patient expects to be discharged to:: Private residence Living Arrangements: Alone Available Help at Discharge: Family;Available PRN/intermittently Type of Home: House Home Access: Level entry     Home Layout: One level     Bathroom Shower/Tub: Teacher, early years/pre: Standard     Home Equipment: Walker - 2 wheels          Prior Functioning/Environment Level of Independence: Independent        Comments: Pt independent with mobility, ADL, IADL, and driving prior to surgery.         OT Problem List: Decreased strength;Decreased knowledge of use of DME or AE;Decreased range of motion;Impaired UE functional use      OT Treatment/Interventions: Self-care/ADL training;Therapeutic exercise;Therapeutic activities;DME and/or AE instruction;Patient/family education    OT Goals(Current goals can be found in the care plan section) Acute Rehab OT Goals Patient Stated Goal: go home and recover OT Goal Formulation: With patient Time For Goal Achievement: 02/11/18 Potential to Achieve Goals: Good ADL Goals Pt Will Perform Lower Body Dressing: sit to/from stand;with min guard assist(with or without AE to maintain back  precautions) Pt Will Transfer to Toilet: with min guard assist;ambulating(BSC/toilet riser over commode, maintaining back precautions) Pt Will Perform Tub/Shower Transfer: with min guard assist;ambulating;Tub transfer(using learned techniques, TTB as needed) Additional ADL Goal #1: Pt will verbalize 100% of back precautions and how to maintain during bed mobility, LB ADL, and toileting to maximize safety and adherence to back precautions.  OT Frequency: Min 1X/week   Barriers to D/C:            Co-evaluation              AM-PAC PT "6 Clicks" Daily Activity     Outcome Measure Help from another person eating meals?: None Help from another person taking care of personal grooming?: None Help from another person toileting, which includes using toliet, bedpan, or urinal?: A Little Help from another person bathing (including washing, rinsing, drying)?: A Little Help from another person to put on and taking off regular upper body clothing?: A Little Help from another person to put on and taking off regular lower body clothing?: A Lot 6 Click Score: 14   End of Session Equipment Utilized During Treatment: Gait belt  Activity Tolerance: Patient tolerated treatment well Patient left:  in chair;with call bell/phone within reach;with chair alarm set  OT Visit Diagnosis: Other abnormalities of gait and mobility (R26.89);Pain Pain - Right/Left: Right Pain - part of body: Shoulder                Time: 9396-8864 OT Time Calculation (min): 51 min Charges:  OT General Charges $OT Visit: 1 Visit OT Evaluation $OT Eval Moderate Complexity: 1 Mod OT Treatments $Self Care/Home Management : 23-37 mins  Jeni Salles, MPH, MS, OTR/L ascom [phone] 01/28/18, 1:06 PM

## 2018-01-28 NOTE — Plan of Care (Signed)
  Problem: Education: Goal: Knowledge of General Education information will improve Description Including pain rating scale, medication(s)/side effects and non-pharmacologic comfort measures Outcome: Progressing   

## 2018-01-28 NOTE — Evaluation (Signed)
Physical Therapy Evaluation Patient Details Name: [name] MRN: [id number] DOB: 1960-11-06 Today's Date: 01/28/2018   History of Present Illness  57yo male s/p L1-L2 fusion 8/28. PMHx includes bioplar, anxiety, OA, DDD, L TKA (07/2015), R TKA (09/2015)  Clinical Impression  Pt initially with severe pain with any movement, showed good effort with mobility but was highly limited and needed a lot of cuing, education, and assist to successfully get to sitting and then to standing from highly elevated seat height.  Spoke with pt about home setting including bed, chair, toilet and it does not appear that he is set up very well for independently home life at this point - talked about solutions/options.  Still expect pt to be able to return home, but likely will need more assist that he originally anticipated. Pt eager to work with PT but back pain and L>R LE radiating pain (and weakness) were limiters with mobility, etc.        Follow Up Recommendations Home health PT    Equipment Recommendations  3in1 (PT)(thinks he has a walker at home, wife to check)    Recommendations for Other Services       Precautions / Restrictions Precautions Precautions: Fall;Back Precaution Booklet Issued: Yes (comment) Precaution Comments: no bending, arching, twisting, no lifting >10 lbs; no brace Restrictions Weight Bearing Restrictions: No      Mobility  Bed Mobility Overal bed mobility: Needs Assistance Bed Mobility: Supine to Sit     Supine to sit: Max assist     General bed mobility comments: Pt in severe pain with all attempts at mobility, was able to maintain neutral spine but needed considerable assist to get to EOB  Transfers Overall transfer level: Needs assistance Equipment used: Rolling walker (2 wheeled) Transfers: Sit to/from Stand Sit to Stand: Min assist         General transfer comment: Pt attempted to get to standing X 2 from minimally raised bed, eventually PT raised bed ~10  inches and with min assist was able to get to standing  Ambulation/Gait Ambulation/Gait assistance: Min guard Gait Distance (Feet): 60 Feet Assistive device: Rolling walker (2 wheeled)       General Gait Details: Once upright pt was able to ambulate relatively well with walker.  He was not overly reliant on the walker and was able to show consistent cadence that was equal bilaterally.  Pt with some fatigue, but not highly pain limited.    Stairs            Wheelchair Mobility    Modified Rankin (Stroke Patients Only)       Balance Overall balance assessment: Modified Independent                                           Pertinent Vitals/Pain Pain Assessment: 0-10 Pain Score: 9  Pain Location: low back/incision site pain that shoots down LLE>RLE Pain Descriptors / Indicators: Sharp;Shooting;Grimacing;Guarding Pain Intervention(s): Limited activity within patient's tolerance;Monitored during session;Repositioned;Patient requesting pain meds-RN notified    Home Living Family/patient expects to be discharged to:: Private residence Living Arrangements: Alone Available Help at Discharge: Family;Available PRN/intermittently Type of Home: House Home Access: Level entry     Home Layout: One level Home Equipment: Walker - 2 wheels      Prior Function Level of Independence: Independent         Comments: Pt  independent with mobility, ADL, IADL, and driving prior to surgery.      Hand Dominance        Extremity/Trunk Assessment   Upper Extremity Assessment Upper Extremity Assessment: Overall WFL for tasks assessed    Lower Extremity Assessment Lower Extremity Assessment: (R LE WFL, L grossly 3-/5 highly pain limited )    Cervical / Trunk Assessment Cervical / Trunk Assessment: Normal  Communication   Communication: No difficulties  Cognition Arousal/Alertness: Awake/alert Behavior During Therapy: WFL for tasks  assessed/performed Overall Cognitive Status: Within Functional Limits for tasks assessed                                        General Comments      Exercises Other Exercises Other Exercises: Pt educated in precautions handout . Other Exercises: Pt educated in car transfer techniques to maintain hip precautions. Other Exercises: Pt educated in cognitive behavioral strategies for improved self mgt of pain Other Exercises: Pt educated in use of toilet riser with handrails overtop his toilet seat to improve safety and minimize pain with toilet transfers. Other Exercises: Pt educated in ways to elevate his bed to improve transfer techniques and maximize safety.    Assessment/Plan    PT Assessment Patient needs continued PT services  PT Problem List         PT Treatment Interventions DME instruction;Gait training;Stair training;Therapeutic activities;Therapeutic exercise;Functional mobility training;Balance training;Cognitive remediation;Patient/family education    PT Goals (Current goals can be found in the Care Plan section)  Acute Rehab PT Goals Patient Stated Goal: go home and recover PT Goal Formulation: With patient Time For Goal Achievement: 02/11/18 Potential to Achieve Goals: Good    Frequency 7X/week   Barriers to discharge        Co-evaluation               AM-PAC PT "6 Clicks" Daily Activity  Outcome Measure Difficulty turning over in bed (including adjusting bedclothes, sheets and blankets)?: A Lot Difficulty moving from lying on back to sitting on the side of the bed? : Unable Difficulty sitting down on and standing up from a chair with arms (e.g., wheelchair, bedside commode, etc,.)?: Unable Help needed moving to and from a bed to chair (including a wheelchair)?: A Little Help needed walking in hospital room?: A Little Help needed climbing 3-5 steps with a railing? : A Lot 6 Click Score: 12    End of Session Equipment Utilized During  Treatment: Gait belt Activity Tolerance: Patient tolerated treatment well Patient left: with call bell/phone within reach;with chair alarm set Nurse Communication: Mobility status PT Visit Diagnosis: Muscle weakness (generalized) (M62.81);Difficulty in walking, not elsewhere classified (R26.2);Pain Pain - part of body: (lumbago)    Time: 4742-5956 PT Time Calculation (min) (ACUTE ONLY): 36 min   Charges:   PT Evaluation $PT Eval Low Complexity: 1 Low PT Treatments $Therapeutic Activity: 8-22 mins        Kreg Shropshire, DPT 01/28/2018, 1:32 PM

## 2018-01-29 MED ORDER — OXYCODONE HCL 5 MG PO TABS: ORAL_TABLET | ORAL | 0 refills | Status: DC

## 2018-01-29 MED ORDER — METHOCARBAMOL 500 MG PO TABS: 1000.0000 mg | ORAL_TABLET | Freq: Four times a day (QID) | ORAL | 0 refills | Status: DC

## 2018-01-29 MED ORDER — FLEET ENEMA 7-19 GM/118ML RE ENEM: 1.0000 | ENEMA | Freq: Every day | RECTAL | Status: DC | PRN

## 2018-01-29 MED ORDER — BISACODYL 10 MG RE SUPP
10.0000 mg | Freq: Every day | RECTAL | Status: DC | PRN
  Administered 2018-01-29: 10 mg via RECTAL
  Filled 2018-01-29: qty 1

## 2018-01-29 NOTE — Progress Notes (Signed)
Discharge reviewed with verbal understanding. 1 narcotic Rx given upon discharge. Escorted to personal vehicle via wc. Pt very upset that this writer would not medicate with oxy prior to DC. Education given about dc medication administration.

## 2018-01-29 NOTE — Progress Notes (Signed)
Pt with concerns of BM, PA notified with new orders for supp and enema.

## 2018-01-29 NOTE — Progress Notes (Signed)
Pt stated he had BM.

## 2018-01-29 NOTE — Discharge Summary (Signed)
Procedure L1-L2 XLIF Procedure Date: 01/27/2018 Diagnosis: Neurogenic claudication   History:  POD2 patient feels pain is much more tolerable. Ambulated with PT and OT yesterday and feels lower extremity symptoms have significantly improved. Complains of abdominal discomfort from constipation. Requests hospital bed and oversized bedside commode.   POD1  L1-L2 XLIF. Recovering fairly well but complains of 9/10 back pain and left hip pain. Denies lower extremity symptoms including pain/numbness/tingling/weakness.  Voiding and eating without issue.   Physical Exam: Vitals:   01/28/18 2357 01/29/18 0835  BP: 110/82 140/90  Pulse: (!) 105 86  Resp: 18 18  Temp: 98.6 F (37 C) 98 F (36.7 C)  SpO2: 96% 95%    AA Ox3 CNI Skin: incision sites (4) intact - 3 back, 1 left lateral. Honeycomb dressings clean and dry. Blistering in 3 areas at border of lateral incision dressing. Glue intact at incisions. No bleeding.  Strength:5/5 throughout (some pain limited weakness in left IS) Sensation: intact and symmetric  Data:  No results for input(s): NA, K, CL, CO2, BUN, CREATININE, LABGLOM, GLUCOSE, CALCIUM in the last 168 hours. No results for input(s): AST, ALT, ALKPHOS in the last 168 hours.  Invalid input(s): TBILI   No results for input(s): WBC, HGB, HCT, PLT in the last 168 hours. Recent Labs  Lab 01/27/18 1157  INR 0.87         Other tests/results:  EXAM: LUMBAR SPINE - 2-3 VIEW 01/28/2018  COMPARISON:  MR lumbar spine of 09/14/2017  FINDINGS: Hardware is positioned posteriorly for fusion of L1-2. Interbody fusion plug is in good position with no complicating features. Stent is noted overlying the left iliac vasculature.  IMPRESSION: Posterior fusion at L1-2.  No complicating features.  Assessment/Plan:  [name] is POD2 s/p L1-2 XLIF for neurogenic claudication. Will continue pain control with celebrex, robaxin, tylenol, and oxycodone. Case worker has met with  patient and setting up home health. Patient was advised that home equipment may not be covered by insurance. Home PT and OT arranged.     PA-C Department of Neurosurgery     

## 2018-01-29 NOTE — Discharge Instructions (Signed)
NEUROSURGERY DISCHARGE INSTRUCTIONS  Admission Diagnosis: Neurogenic claudication  Discharge Diagnosis: Neurogenic claudication  Operative procedure: L1-2 XLIF  The following are instructions to help in your recovery once you have been discharged from the hospital. Even if you feel well, it is important that you follow these activity guidelines. If you do not let your neck heal properly from the surgery, you can increase the chance of return of your symptoms and other complications.   What to do after you leave the hospital:  Recommended diet:  Increase protein intake to promote wound healing. You may return to your usual diet. However, you may experience discomfort when swallowing in the first month after your surgery. This is normal. You may find that softer foods are more comfortable for you to swallow. Be sure to stay hydrated.   Recommended activity: No bending, lifting, or twisting (BLT). Avoid lifting objects heavier than 10 pounds (gallon milk jug). Where possible, avoid household activities that involve lifting, bending, reaching, pushing, or pulling such as laundry, vacuuming, grocery shopping, and childcare. Try to arrange for help from friends and family for these activities while your back heals.   Increase physical activity slowly as tolerated. Taking short walks is encouraged, but avoid strenuous exercise. Do not jog, run, bicycle, lift weights, or participate in any other exercises unless specifically allowed by your doctor.   You should not drive until cleared by your doctor.   Until released by your doctor, you should not return to work or school. You should rest at home and let your body heal.   You may shower the day after your surgery. After showering, lightly dab your incision dry. Do not take a tub bath or go swimming until approved by your doctor at your follow-up appointment.   If you smoke, we strongly recommend that you quit. Smoking has been proven to interfere  with normal bone healing and will dramatically reduce the success rate of your surgery. Please contact [facility] (800-QUIT-NOW) and use the resources at www.[facility].com for assistance in stopping smoking.   Medications  Do not restart Aspirin until seven days after surgery  * Do not take anti-inflammatory medications for 3 months after surgery (naproxen [Aleve], ibuprofen [Advil, Motrin], celecoxib [Celebrex], etc.). These medications can prevent your bones from healing properly.   You may restart home medications.   Wound Care Instructions  If you have a dressing on your incision, remove it two days after your surgery. Keep your incision area clean and dry.   If you have staples or stitches on your incision, you should have a follow up scheduled for removal. If you do not have staples or stitches, you will have steri-strips (small pieces of surgical tape) or Dermabond glue. The steri-strips/glue should begin to peel away within about a week (it is fine if the steri-strips fall off before then). If the strips are still in place one week after your surgery, you may gently remove them.    Please Report any of the following: You may experience pain in your neck and/or pain between your shoulder blades. This is normal and should improve in the next few weeks with the help of pain medication, muscle relaxers, and rest. Some patients report that a warm compress on the back of the neck or between the shoulder blades helps.   However, should you experience any of the following, contact us immediately:   New numbness or weakness   Pain that is progressively getting worse, and is not relieved by your pain  medication, muscle relaxers, rest, and warm compresses   Bleeding, redness, swelling, pain, or drainage from surgical incision   Chills or flu-like symptoms   Fever greater than 101.0 F (38.3 C)   Inability to eat, drink fluids, or take medications   Problems with bowel or bladder  functions   Difficulty breathing or shortness of breath   Warmth, tenderness, or swelling in your calf    Additional Follow up appointments During office hours (Monday-Friday 9 am to 5 pm), please call your physician at [phone]  After hours and weekends, please call the [facility] Operator at  [phone] and ask for the Neurosurgeon On Call   For a life-threatening emergency, call 911    Please see below for scheduled appointments:

## 2018-01-29 NOTE — Plan of Care (Signed)
  Problem: Education: Goal: Knowledge of General Education information will improve Description Including pain rating scale, medication(s)/side effects and non-pharmacologic comfort measures Outcome: Progressing   Problem: Health Behavior/Discharge Planning: Goal: Ability to manage health-related needs will improve Outcome: Progressing   Problem: Clinical Measurements: Goal: Ability to maintain clinical measurements within normal limits will improve Outcome: Progressing Goal: Will remain free from infection Outcome: Progressing Goal: Diagnostic test results will improve Outcome: Progressing Goal: Respiratory complications will improve Outcome: Progressing Goal: Cardiovascular complication will be avoided Outcome: Progressing   Problem: Activity: Goal: Risk for activity intolerance will decrease Outcome: Progressing   Problem: Nutrition: Goal: Adequate nutrition will be maintained Outcome: Progressing   Problem: Pain Managment: Goal: General experience of comfort will improve Outcome: Progressing   Problem: Elimination: Goal: Will not experience complications related to bowel motility Outcome: Progressing Goal: Will not experience complications related to urinary retention Outcome: Progressing   Problem: Safety: Goal: Ability to remain free from injury will improve Outcome: Progressing

## 2018-01-29 NOTE — Care Management Note (Signed)
Case Management Note  Patient Details  Name: [name] MRN: [id number] Date of Birth: [birth year]  Subjective/Objective:   POD # 2 s/p L I-2 XLIF.  Met with patient at bedside. He is in a lot of pain and uncomfortable to to lack of bowel movement. Patient lives alone. He states he has multiple family members that lives close by that can come by and help. Discussed DME needs per OT recommendations ("per pt"). He states he will need a hospital bed, extra wide bedside commode. He requested a lift chair but I told him this was not a rental item available. Offered a list of home health agencies. He has no preference. Referral to [facility] for HHPT and [place]. [facility] to come and speak with patient regarding DME needs and out of pocket costs.                Action/Plan: [facility] for PT and OT.   Expected Discharge Date:  01/29/18               Expected Discharge Plan:  [place]  In-House Referral:     Discharge planning Services  CM Consult  Post Acute Care Choice:  Durable Medical Equipment, Home Health Choice offered to:  Patient  DME Arranged:  Bedside commode, Hospital bed DME Agency:  [place]:  PT, OT [facility] Agency:  [place]  Status of Service:  In process, will continue to follow  If discussed at Long Length of Stay Meetings, dates discussed:    Additional Comments:  Jolly Mango, RN 01/29/2018, 9:32 AM

## 2018-01-31 DIAGNOSIS — Z981 Arthrodesis status: Secondary | ICD-10-CM | POA: Diagnosis not present

## 2018-01-31 DIAGNOSIS — Z4789 Encounter for other orthopedic aftercare: Secondary | ICD-10-CM | POA: Diagnosis not present

## 2018-01-31 DIAGNOSIS — M48062 Spinal stenosis, lumbar region with neurogenic claudication: Secondary | ICD-10-CM | POA: Diagnosis not present

## 2018-02-04 ENCOUNTER — Telehealth: Payer: Self-pay | Admitting: Licensed Clinical Social Worker

## 2018-02-04 NOTE — Telephone Encounter (Signed)
EMMI flagged patient for answering yes to loss of interest in things and yes to feeling sad/hopless/anxious/empty. Clinical Education officer, museum (CSW) contacted patient via telephone. Per patient he is doing "pretty okay" and stated that the "pain is getting better." Patient reported that his follow up appointment with neuro PA Estill Bamberg is next week. Patient reported that he did receive a hospital bed but ended up not needing it. Patient reported that he is depressed and is being treated for that. Per patient he goes to [employer] [facility] mental health clinic in [place]. Per patient he has an appointment at [facility] in a couple of months. CSW encouraged patient to call [facility] to see if he can get in sooner. Patient reported that he is not having thoughts of hurting himself. Patient reported no needs or concerns. Patient thanked CSW for calling.   [employer], LCSW [phone]

## 2018-02-05 DIAGNOSIS — Z4789 Encounter for other orthopedic aftercare: Secondary | ICD-10-CM | POA: Diagnosis not present

## 2018-02-05 DIAGNOSIS — M48062 Spinal stenosis, lumbar region with neurogenic claudication: Secondary | ICD-10-CM | POA: Diagnosis not present

## 2018-02-05 DIAGNOSIS — Z981 Arthrodesis status: Secondary | ICD-10-CM | POA: Diagnosis not present

## 2018-02-08 DIAGNOSIS — M48062 Spinal stenosis, lumbar region with neurogenic claudication: Secondary | ICD-10-CM | POA: Diagnosis not present

## 2018-02-08 DIAGNOSIS — Z4789 Encounter for other orthopedic aftercare: Secondary | ICD-10-CM | POA: Diagnosis not present

## 2018-02-08 DIAGNOSIS — Z981 Arthrodesis status: Secondary | ICD-10-CM | POA: Diagnosis not present

## 2018-02-10 DIAGNOSIS — Z4789 Encounter for other orthopedic aftercare: Secondary | ICD-10-CM | POA: Diagnosis not present

## 2018-02-10 DIAGNOSIS — Z981 Arthrodesis status: Secondary | ICD-10-CM | POA: Diagnosis not present

## 2018-02-10 DIAGNOSIS — M48062 Spinal stenosis, lumbar region with neurogenic claudication: Secondary | ICD-10-CM | POA: Diagnosis not present

## 2018-02-15 DIAGNOSIS — Z4789 Encounter for other orthopedic aftercare: Secondary | ICD-10-CM | POA: Diagnosis not present

## 2018-02-15 DIAGNOSIS — Z981 Arthrodesis status: Secondary | ICD-10-CM | POA: Diagnosis not present

## 2018-02-15 DIAGNOSIS — M48062 Spinal stenosis, lumbar region with neurogenic claudication: Secondary | ICD-10-CM | POA: Diagnosis not present

## 2018-02-17 ENCOUNTER — Other Ambulatory Visit: Payer: Self-pay | Admitting: Internal Medicine

## 2018-02-17 ENCOUNTER — Encounter: Payer: Self-pay | Admitting: Neurosurgery

## 2018-02-17 ENCOUNTER — Telehealth: Payer: Self-pay

## 2018-02-17 DIAGNOSIS — F17210 Nicotine dependence, cigarettes, uncomplicated: Secondary | ICD-10-CM

## 2018-02-17 DIAGNOSIS — Z720 Tobacco use: Secondary | ICD-10-CM

## 2018-02-17 MED ORDER — VARENICLINE TARTRATE 0.5 MG PO TABS: ORAL_TABLET | ORAL | 0 refills | Status: AC

## 2018-02-17 MED ORDER — VARENICLINE TARTRATE 1 MG PO TABS: ORAL_TABLET | ORAL | 0 refills | Status: AC

## 2018-02-17 NOTE — Telephone Encounter (Signed)
Please advise 

## 2018-02-17 NOTE — Telephone Encounter (Signed)
Patient has no scheduled appointment and was last seen on 11-13-17.

## 2018-02-17 NOTE — Telephone Encounter (Signed)
Not a [facility] patient.   Copied from [place] [id number]. Topic: General - Other >> Feb 17, 2018  3:18 PM Yvette Rack wrote: Reason for CRM: Pt requests a Rx for Chantix. Cb# [phone]

## 2018-02-17 NOTE — Telephone Encounter (Signed)
Please have/make appt by 11 or 05/2018   [place]

## 2018-02-18 NOTE — Telephone Encounter (Signed)
mychart message has been sent to patient

## 2018-02-22 DIAGNOSIS — M545 Low back pain: Secondary | ICD-10-CM | POA: Diagnosis not present

## 2018-02-25 DIAGNOSIS — M48062 Spinal stenosis, lumbar region with neurogenic claudication: Secondary | ICD-10-CM | POA: Diagnosis not present

## 2018-02-25 DIAGNOSIS — M4326 Fusion of spine, lumbar region: Secondary | ICD-10-CM | POA: Diagnosis not present

## 2018-03-01 DIAGNOSIS — M545 Low back pain: Secondary | ICD-10-CM | POA: Diagnosis not present

## 2018-03-08 DIAGNOSIS — M545 Low back pain: Secondary | ICD-10-CM | POA: Diagnosis not present

## 2018-03-09 DIAGNOSIS — R200 Anesthesia of skin: Secondary | ICD-10-CM | POA: Diagnosis not present

## 2018-03-11 ENCOUNTER — Other Ambulatory Visit: Payer: Self-pay

## 2018-03-15 ENCOUNTER — Encounter: Payer: Self-pay | Admitting: Internal Medicine

## 2018-03-15 DIAGNOSIS — Z96653 Presence of artificial knee joint, bilateral: Secondary | ICD-10-CM | POA: Diagnosis not present

## 2018-03-15 DIAGNOSIS — G4733 Obstructive sleep apnea (adult) (pediatric): Secondary | ICD-10-CM | POA: Diagnosis not present

## 2018-03-15 DIAGNOSIS — C73 Malignant neoplasm of thyroid gland: Secondary | ICD-10-CM | POA: Insufficient documentation

## 2018-03-21 DIAGNOSIS — F331 Major depressive disorder, recurrent, moderate: Secondary | ICD-10-CM | POA: Insufficient documentation

## 2018-03-22 DIAGNOSIS — M545 Low back pain: Secondary | ICD-10-CM | POA: Diagnosis not present

## 2018-03-30 ENCOUNTER — Ambulatory Visit: Payer: 59 | Admitting: Physician Assistant

## 2018-03-30 ENCOUNTER — Encounter: Payer: Self-pay | Admitting: Neurosurgery

## 2018-03-30 DIAGNOSIS — M545 Low back pain: Secondary | ICD-10-CM | POA: Diagnosis not present

## 2018-03-30 DIAGNOSIS — F411 Generalized anxiety disorder: Secondary | ICD-10-CM

## 2018-03-30 DIAGNOSIS — F331 Major depressive disorder, recurrent, moderate: Secondary | ICD-10-CM

## 2018-03-30 MED ORDER — ALPRAZOLAM 1 MG PO TABS: 1.0000 mg | ORAL_TABLET | Freq: Three times a day (TID) | ORAL | 0 refills | Status: AC | PRN

## 2018-03-30 MED ORDER — DULOXETINE HCL 60 MG PO CPEP: 60.0000 mg | ORAL_CAPSULE | Freq: Every day | ORAL | 0 refills | Status: DC

## 2018-03-30 MED ORDER — DULOXETINE HCL 30 MG PO CPEP: 30.0000 mg | ORAL_CAPSULE | Freq: Every day | ORAL | 0 refills | Status: DC

## 2018-03-30 MED ORDER — HYDROXYZINE PAMOATE 25 MG PO CAPS: 25.0000 mg | ORAL_CAPSULE | Freq: Three times a day (TID) | ORAL | 1 refills | Status: AC | PRN

## 2018-03-30 NOTE — Progress Notes (Signed)
[facility] Check  Patient ID: [name],  MRN: [id number]  PCP: McLean-Scocuzza, Nino Glow, MD  Date of Evaluation: 03/30/2018 Time spent:60 minutes  Chief Complaint:  Chief Complaint    Anxiety; Depression      HISTORY/CURRENT STATUS: HPI LOV was 09/24/17.  Since then, he's had a DVT, had IVC filter placed. Also had back surgery, for collapsed disc that was putting pressure on spinal cord in the L spine.  Then was found to have scarring in right lung, and that's being watched.  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 This was a few weeks.  Not needing chemo or XRT that he is aware of.   At Some point in June, his sister and niece were trying to get in touch with him and couldn't reach him.  They went to his house and found him unresponsive and called the ambulance.  He was taken to [facility] and admitted.  "They had a fit that I was on such a high dose of Seroquel.  They took me off 1 pill and left me on the 300mg ."Had all sort of tests done.  And that is when they found the nodule in the thyroid gland as well as the scarring in the right lung.  I have had pneumonia several times so hopefully that is just scarring.  They will be watching that."  Patient states he is much more depressed.  He does not want to do anything and has very low energy.  He feels down, not wanting to be here. "Sometimes I wish God would take me. I'm not going to kill myself. I would have done it by now if I was going to.  I left [place] the beginning of the year.  That's been a good thing but it's still hard.  And my health has been so bad.  It's tough."  The anxiety is a lot worse too.  He has generalized anxiety but also can have panic attacks just thinking about everything that has happened.  He knows that several of these illnesses could lead to death.  He had quit smoking for a while but has started back usually smoking 3 or 4 cigarettes a day.   Individual Medical History/ Review of  Systems: Changes? :Yes See above  Allergies: Gabapentin and Sulfa antibiotics  Current Medications:  Current Outpatient Medications:  .  albuterol (PROVENTIL HFA;VENTOLIN HFA) 108 (90 Base) MCG/ACT inhaler, Inhale 1-2 puffs into the lungs every 6 (six) hours as needed for wheezing or shortness of breath., Disp: 1 Inhaler, Rfl: 11 .  ALPRAZolam (XANAX) 1 MG tablet, Take 1 mg by mouth 3 (three) times daily as needed for anxiety. , Disp: , Rfl:  .  celecoxib (CELEBREX) 200 MG capsule, Take 200 mg by mouth 2 (two) times daily., Disp: , Rfl:  .  diphenhydrAMINE (BENADRYL) 25 MG tablet, Take 25 mg by mouth every 6 (six) hours as needed for allergies., Disp: , Rfl:  .  furosemide (LASIX) 20 MG tablet, Take 1 tablet (20 mg total) by mouth daily. In am (Patient taking differently: Take 20 mg by mouth daily as needed for edema. ), Disp: 30 tablet, Rfl: 3 .  methocarbamol (ROBAXIN) 500 MG tablet, Take 2 tablets (1,000 mg total) by mouth every 6 (six) hours., Disp: 90 tablet, Rfl: 0 .  Multiple Vitamin (MULTIVITAMIN) tablet, Take 1 tablet by mouth daily., Disp: , Rfl:  .  QUEtiapine (SEROQUEL) 300 MG tablet, Take 300 mg by mouth at  bedtime. , Disp: , Rfl:  .  ALPRAZolam (XANAX) 1 MG tablet, Take 1 tablet (1 mg total) by mouth 3 (three) times daily as needed for anxiety., Disp: 90 tablet, Rfl: 0 .  doxycycline (VIBRAMYCIN) 100 MG capsule, Take 1 capsule (100 mg total) by mouth 2 (two) times daily. (Patient not taking: Reported on 01/28/2018), Disp: 28 capsule, Rfl: 0 .  DULoxetine (CYMBALTA) 30 MG capsule, Take 1 capsule (30 mg total) by mouth daily. Take one every morning until gone, then start the 60 mg daily, Disp: 14 capsule, Rfl: 0 .  DULoxetine (CYMBALTA) 60 MG capsule, Take 120 mg by mouth daily., Disp: , Rfl:  .  DULoxetine (CYMBALTA) 60 MG capsule, Take 1 capsule (60 mg total) by mouth daily. Start this after the 14 days of 30mg ., Disp: 30 capsule, Rfl: 0 .  hydrOXYzine (VISTARIL) 25 MG capsule, Take 1  capsule (25 mg total) by mouth 3 (three) times daily as needed for anxiety., Disp: 90 capsule, Rfl: 1 .  mupirocin ointment (BACTROBAN) 2 %, Apply 1 application topically 2 (two) times daily. Right leg (Patient not taking: Reported on 01/27/2018), Disp: 30 g, Rfl: 0 .  oxyCODONE (ROXICODONE) 5 MG immediate release tablet, 5-10 mg every 4 hours as needed for severe pain (Patient not taking: Reported on 03/30/2018), Disp: 50 tablet, Rfl: 0 .  varenicline (CHANTIX) 0.5 MG tablet, 0.5 1x per day x 1-3 days, then day 4-7 0.5 2x per day, day 8 to 3 months 1 mg bid (Patient not taking: Reported on 03/30/2018), Disp: 11 tablet, Rfl: 0 .  varenicline (CHANTIX) 1 MG tablet, Start day 8 x 3 months 1 mg bid (Patient not taking: Reported on 03/30/2018), Disp: 180 tablet, Rfl: 0 Medication Side Effects: none  Family Medical/ Social History: Changes? Yes out of work b/c all the health problems.  Hopes to go back soon.  MENTAL HEALTH EXAM:  There were no vitals taken for this visit.There is no height or weight on file to calculate BMI.  General Appearance: Fairly Groomed steristrips anterior neck  Eye Contact:  Good  Speech:  Clear and Coherent  Volume:  Normal  Mood:  Depressed  Affect:  Depressed  Thought Process:  Goal Directed  Orientation:  Full (Time, Place, and Person)  Thought Content: Logical   Suicidal Thoughts:  Yes.  without intent/plan  Homicidal Thoughts:  No  Memory:  WNL  Judgement:  Good  Insight:  Good  Psychomotor Activity:  Normal  Concentration:  Concentration: Good  Recall:  Good  Fund of Knowledge: Good  Language: Good  Assets:  Desire for Improvement  ADL's:  Intact  Cognition: WNL  Prognosis:  Good    DIAGNOSES:    ICD-10-CM   1. Major depressive disorder, recurrent episode, moderate (HCC) F33.1   2. Generalized anxiety disorder F41.1     Receiving Psychotherapy: No    RECOMMENDATIONS: I spent 60 minutes with the patient and 50% of that time was in  counseling.  When I was going over the patient's medications with him he states he is not taking the Cymbalta.  He cannot remember when he last took it states that it has been a very long time.  I looked back through our chart and when I last saw him in April he stated that the Cymbalta had really help after we have increased it to 120 mg in January.  He was pleased that he was doing much better.  I did some searching through his admissions  in June and on June 4 Cymbalta is listed as a medication to be discharged on and it is not present thereafter.  I cannot figure out exactly when or why the Cymbalta was stopped. The patient even states he could have possibly stopped it on his own but does not remember if he did or why he would have done so.  We will restart Cymbalta 30 mg every morning for 2 weeks and then go up to 60 mg. Add hydroxyzine 25 mg 1 3 times daily as needed Continue Xanax as noted above.  He is aware that he is on a very high dose of Xanax already and we need to try other measures to help with the anxiety rather than continue to increase this medicine. Continue Seroquel 300 mg nightly.  I have reassured him that up to 800 mg is common to be used in psychiatry, so the 600 mg he was on that he stated someone somewhere got upset about was okay to take. Recommend psychotherapy. Return in 4 to 6 weeks    Donnal Moat, PA-C

## 2018-04-02 DIAGNOSIS — M545 Low back pain: Secondary | ICD-10-CM | POA: Diagnosis not present

## 2018-04-06 DIAGNOSIS — C73 Malignant neoplasm of thyroid gland: Secondary | ICD-10-CM | POA: Diagnosis not present

## 2018-04-07 ENCOUNTER — Ambulatory Visit
Admission: RE | Admit: 2018-04-07 | Discharge: 2018-04-07 | Disposition: A | Discharge status: Home / Self Care | Payer: 59 | Source: Ambulatory Visit | Attending: Internal Medicine | Admitting: Internal Medicine

## 2018-04-07 DIAGNOSIS — I712 Thoracic aortic aneurysm, without rupture: Secondary | ICD-10-CM | POA: Diagnosis not present

## 2018-04-07 DIAGNOSIS — J439 Emphysema, unspecified: Secondary | ICD-10-CM | POA: Diagnosis not present

## 2018-04-07 DIAGNOSIS — I700 Atherosclerosis of aorta: Secondary | ICD-10-CM | POA: Diagnosis not present

## 2018-04-07 DIAGNOSIS — M545 Low back pain: Secondary | ICD-10-CM | POA: Diagnosis not present

## 2018-04-07 DIAGNOSIS — R911 Solitary pulmonary nodule: Secondary | ICD-10-CM | POA: Diagnosis not present

## 2018-04-07 DIAGNOSIS — R918 Other nonspecific abnormal finding of lung field: Secondary | ICD-10-CM | POA: Insufficient documentation

## 2018-04-08 NOTE — Progress Notes (Signed)
* [facility]     Assessment and Plan:   Masslike consolidation/lung nodule. -Appears to be resolved, no need for repeat scans.   OSA. --Split night study was performed on 08/04/12; AHI of 14.9; titrated to CPAP of 8.  --sat dropped to 89% on RA, patient was started on 2L [place].  --Is no longer on CPAP. He lost a lot of weight after gastric bypass and is no longer sleepy during the day.   Nicotine abuse. - Discussed that smoking cessation, including various options, 3 minutes spent in discussion.  Chronic bronchitis -Symptoms of chronic bronchitis, he is currently using his Symbicort as needed. - We will prescribe albuterol to be used as needed in its place.  Meds ordered this encounter  Medications  . varenicline (CHANTIX STARTING MONTH PAK) 0.5 MG X 11 & 1 MG X 42 tablet    Sig: Take one 0.5 mg tablet by mouth once daily for 3 days, then increase to one 0.5 mg tablet twice daily for 4 days, then increase to one 1 mg tablet twice daily.    Dispense:  53 tablet    Refill:  0  . albuterol (PROAIR HFA) 108 (90 Base) MCG/ACT inhaler    Sig: Inhale 1-2 puffs into the lungs every 6 (six) hours as needed for wheezing or shortness of breath.    Dispense:  1 Inhaler    Refill:  5   Return in about 1 year (around 04/10/2019).  Greater than 50% of the 25 minute visit was spent in counseling/coordination of care regarding chronic bronchitis, sleep apnea, smoking cessation.   Date: 04/08/2018  MRN# [id number] [name] 1960-12-19   [name] is a 57 y.o. old male seen in follow up for chief complaint of  Chief Complaint  Patient presents with  . chest congestion    and head congestion patient has slight cough. He denies wheezing, and chest tightness.  . smoker    pt wants to quit, he would like chantix sent in. His pcp did not do PA.     HPI:   Patient is a 57 year old male, recently seen in March 2019 inpatient with left upper lobe consolidation and masslike  infiltrate in the lingula in the left lower lobe medially.  It is similar presentation a few years prior, underwent bronchoscopy which was negative and his infiltrates resolved.  On this recent admission patient was seen by ID service, he underwent sputum AFB smear x3 which were negative. He feels that he is doing well, he is gaining weight, breathing is much better, nearly back to normal. He has minimal cough, which is dry.   Today he feels that his breathing has been doing ok. He underwent back surgery and is still recovering and trying to get his strength back. He feels that his breathing is doing well, he is taking symbicort about twice per week only.  He continues to smoke, but down to about 5 per day from 3 ppd and would like to quit. He has tried and failed welbutrin, and OTC patch and gums without success.   He has a history of OSA. He was originally diagnosed remotely but lost a tremendous amount of weight after gastric bypass in 2003. He was seen by [facility] in 2014 for OSA, and was encouraged to continue to use cpap, but is now longer using it since he had a gastric bypass.  **CT chest 04/07/2018>> imaging personally reviewed.  Previously seen areas of nodule/masslike infiltrate  have resolved in 2 areas of scarring with associated traction bronchiectasis. **sleep study on 08/04/12>> AHI of 14.9, CPAP titrated to 8, but did not continue to use it. **CT chest 09/30/2017>> reviewed and compared with previous on 08/11/2017; there is a dramatic improvement in the previous masslike consolidation in the left lung.  This appears to be resolving to scar.  There is a 1 cm nodule in the left upper lobe, again likely residual from the previous pneumonia.  Medication:    Current Outpatient Medications:  .  albuterol (PROVENTIL HFA;VENTOLIN HFA) 108 (90 Base) MCG/ACT inhaler, Inhale 1-2 puffs into the lungs every 6 (six) hours as needed for wheezing or shortness of breath., Disp: 1 Inhaler, Rfl:  11 .  ALPRAZolam (XANAX) 1 MG tablet, Take 1 mg by mouth 3 (three) times daily as needed for anxiety. , Disp: , Rfl:  .  ALPRAZolam (XANAX) 1 MG tablet, Take 1 tablet (1 mg total) by mouth 3 (three) times daily as needed for anxiety., Disp: 90 tablet, Rfl: 0 .  celecoxib (CELEBREX) 200 MG capsule, Take 200 mg by mouth 2 (two) times daily., Disp: , Rfl:  .  diphenhydrAMINE (BENADRYL) 25 MG tablet, Take 25 mg by mouth every 6 (six) hours as needed for allergies., Disp: , Rfl:  .  doxycycline (VIBRAMYCIN) 100 MG capsule, Take 1 capsule (100 mg total) by mouth 2 (two) times daily. (Patient not taking: Reported on 01/28/2018), Disp: 28 capsule, Rfl: 0 .  DULoxetine (CYMBALTA) 30 MG capsule, Take 1 capsule (30 mg total) by mouth daily. Take one every morning until gone, then start the 60 mg daily, Disp: 14 capsule, Rfl: 0 .  DULoxetine (CYMBALTA) 60 MG capsule, Take 120 mg by mouth daily., Disp: , Rfl:  .  DULoxetine (CYMBALTA) 60 MG capsule, Take 1 capsule (60 mg total) by mouth daily. Start this after the 14 days of 30mg ., Disp: 30 capsule, Rfl: 0 .  furosemide (LASIX) 20 MG tablet, Take 1 tablet (20 mg total) by mouth daily. In am (Patient taking differently: Take 20 mg by mouth daily as needed for edema. ), Disp: 30 tablet, Rfl: 3 .  hydrOXYzine (VISTARIL) 25 MG capsule, Take 1 capsule (25 mg total) by mouth 3 (three) times daily as needed for anxiety., Disp: 90 capsule, Rfl: 1 .  methocarbamol (ROBAXIN) 500 MG tablet, Take 2 tablets (1,000 mg total) by mouth every 6 (six) hours., Disp: 90 tablet, Rfl: 0 .  Multiple Vitamin (MULTIVITAMIN) tablet, Take 1 tablet by mouth daily., Disp: , Rfl:  .  mupirocin ointment (BACTROBAN) 2 %, Apply 1 application topically 2 (two) times daily. Right leg (Patient not taking: Reported on 01/27/2018), Disp: 30 g, Rfl: 0 .  oxyCODONE (ROXICODONE) 5 MG immediate release tablet, 5-10 mg every 4 hours as needed for severe pain (Patient not taking: Reported on 03/30/2018), Disp:  50 tablet, Rfl: 0 .  QUEtiapine (SEROQUEL) 300 MG tablet, Take 300 mg by mouth at bedtime. , Disp: , Rfl:  .  varenicline (CHANTIX) 0.5 MG tablet, 0.5 1x per day x 1-3 days, then day 4-7 0.5 2x per day, day 8 to 3 months 1 mg bid (Patient not taking: Reported on 03/30/2018), Disp: 11 tablet, Rfl: 0 .  varenicline (CHANTIX) 1 MG tablet, Start day 8 x 3 months 1 mg bid (Patient not taking: Reported on 03/30/2018), Disp: 180 tablet, Rfl: 0   Allergies:  Gabapentin and Sulfa antibiotics  Review of Systems:  Constitutional: Feels well. Cardiovascular: Denies chest pain,  exertional chest pain.  Pulmonary: Denies hemoptysis, pleuritic chest pain.   The remainder of systems were reviewed and were found to be negative other than what is documented in the HPI.    Physical Examination:   VS: BP 120/90 (BP Location: Left Arm, Cuff Size: Large)   Pulse 80   Ht 6\' 3"  (1.905 m)   SpO2 96%   BMI 32.50 kg/m   General Appearance: No distress  Neuro:without focal findings, mental status, speech normal, alert and oriented HEENT: PERRLA, EOM intact Pulmonary: No wheezing, No rales  CardiovascularNormal S1,S2.  No m/r/g.  Abdomen: Benign, Soft, non-tender, No masses Renal:  No costovertebral tenderness  GU:  No performed at this time. Endoc: No evident thyromegaly, no signs of acromegaly or Cushing features Skin:   warm, no rashes, no ecchymosis  Extremities: normal, no cyanosis, clubbing.     LABORATORY PANEL:   CBC No results for input(s): WBC, HGB, HCT, PLT in the last 168 hours. ------------------------------------------------------------------------------------------------------------------  Chemistries  No results for input(s): NA, K, CL, CO2, GLUCOSE, BUN, CREATININE, CALCIUM, MG, AST, ALT, ALKPHOS, BILITOT in the last 168 hours.  Invalid input(s): GFRCGP ------------------------------------------------------------------------------------------------------------------  Cardiac  Enzymes No results for input(s): TROPONINI in the last 168 hours. ------------------------------------------------------------  RADIOLOGY:   No results found for this or any previous visit. Results for orders placed in visit on 07/07/17  DG Chest 2 View   Narrative CLINICAL DATA:  Shortness of breath.  Reported recent pneumonia  EXAM: CHEST  2 VIEW  COMPARISON:  January 24, 2017  FINDINGS: There is scarring in the right mid lung region, stable. There is no appreciable edema or consolidation. Heart size and pulmonary vascularity are normal. No adenopathy. No bone lesions. There are areas of mild eventration along each hemidiaphragm, stable.  IMPRESSION: Scarring right mid lung region anteriorly. No edema or consolidation. Stable cardiac silhouette.   Electronically Signed   By: Lowella Grip III M.D.   On: 07/08/2017 08:54    ------------------------------------------------------------------------------------------------------------------  Thank  you for allowing [facility] to assist in the care of your patient. Our recommendations are noted above.  Please contact us if we can be of further service.  Marda Stalker, M.D., F.C.C.P.  Board Certified in Internal Medicine, Pulmonary Medicine, [place], and Sleep Medicine.   Pulmonary and Critical Care Office Number: [phone]   04/08/2018

## 2018-04-09 ENCOUNTER — Encounter: Payer: Self-pay | Admitting: Internal Medicine

## 2018-04-09 ENCOUNTER — Ambulatory Visit: Payer: 59 | Admitting: Internal Medicine

## 2018-04-09 VITALS — BP 120/90 | HR 80 | Ht 75.0 in

## 2018-04-09 DIAGNOSIS — Z23 Encounter for immunization: Secondary | ICD-10-CM | POA: Diagnosis not present

## 2018-04-09 DIAGNOSIS — M545 Low back pain: Secondary | ICD-10-CM | POA: Diagnosis not present

## 2018-04-09 DIAGNOSIS — J42 Unspecified chronic bronchitis: Secondary | ICD-10-CM

## 2018-04-09 DIAGNOSIS — R911 Solitary pulmonary nodule: Secondary | ICD-10-CM | POA: Diagnosis not present

## 2018-04-09 DIAGNOSIS — F17210 Nicotine dependence, cigarettes, uncomplicated: Secondary | ICD-10-CM | POA: Diagnosis not present

## 2018-04-09 DIAGNOSIS — Z720 Tobacco use: Secondary | ICD-10-CM

## 2018-04-09 MED ORDER — ALBUTEROL SULFATE HFA 108 (90 BASE) MCG/ACT IN AERS: 1.0000 | INHALATION_SPRAY | Freq: Four times a day (QID) | RESPIRATORY_TRACT | 5 refills | Status: AC | PRN

## 2018-04-09 MED ORDER — VARENICLINE TARTRATE 0.5 MG X 11 & 1 MG X 42 PO MISC: ORAL | 0 refills | Status: AC

## 2018-04-09 NOTE — Patient Instructions (Addendum)
Use albuterol as needed.   --The best way to quit is to set a quit date, usually a day that has meaning like someone's birthday, anniversary, or holiday.   --Start any medication prescribed for quitting one week before your quit date. Then toss out the cigarettes on your quit date.  --If you start smoking again, start from scratch--set another quit day and try again!

## 2018-04-13 DIAGNOSIS — M545 Low back pain: Secondary | ICD-10-CM | POA: Diagnosis not present

## 2018-04-15 DIAGNOSIS — M4326 Fusion of spine, lumbar region: Secondary | ICD-10-CM | POA: Diagnosis not present

## 2018-04-15 DIAGNOSIS — M48062 Spinal stenosis, lumbar region with neurogenic claudication: Secondary | ICD-10-CM | POA: Diagnosis not present

## 2018-04-15 DIAGNOSIS — Z981 Arthrodesis status: Secondary | ICD-10-CM | POA: Diagnosis not present

## 2018-04-22 ENCOUNTER — Telehealth: Payer: Self-pay | Admitting: Internal Medicine

## 2018-04-22 NOTE — Telephone Encounter (Signed)
Called [facility] and asked it they would fax PA request. They have been faxing too [phone] in which we have never received. Will try to initiate PA via covermymeds.

## 2018-04-22 NOTE — Telephone Encounter (Signed)
Pt calling would like an update on the Prior auth we were working on for his Chantix   Please call back

## 2018-04-22 NOTE — Telephone Encounter (Signed)
PA intiated for Chantix starter via covermmeds [place]: Percell Locus - PA Case ID: [id number]

## 2018-04-22 NOTE — Telephone Encounter (Signed)
PA approved.  PT ID# [id number] APPROVED THRU 04/23/2019

## 2018-05-03 ENCOUNTER — Other Ambulatory Visit: Payer: Self-pay | Admitting: Physician Assistant

## 2018-05-04 ENCOUNTER — Other Ambulatory Visit: Payer: Self-pay | Admitting: Internal Medicine

## 2018-05-04 ENCOUNTER — Telehealth: Payer: Self-pay | Admitting: *Deleted

## 2018-05-04 DIAGNOSIS — R600 Localized edema: Secondary | ICD-10-CM

## 2018-05-04 MED ORDER — FUROSEMIDE 20 MG PO TABS: 20.0000 mg | ORAL_TABLET | Freq: Every day | ORAL | 0 refills | Status: AC | PRN

## 2018-05-04 NOTE — Telephone Encounter (Signed)
Flu shot documented

## 2018-05-04 NOTE — Telephone Encounter (Signed)
Yes, we restarted it at the last visit.  30 mg for 2 weeks, then 60 mg qd till next appointment.  I'm not sure why the Rx was cancelled on 04/09/18???  Please ok the Rx for a 90 day supply.

## 2018-05-04 NOTE — Telephone Encounter (Signed)
Looks like your note mentions it was discontinued previously as well.  Is he suppose to continue? He's had several medical appts maybe that's where it got discontinued?  Let me know  Thanks

## 2018-05-04 NOTE — Telephone Encounter (Signed)
Copied from [place] [phone]. Topic: General - Other >> May 04, 2018 10:08 AM Yvette Rack wrote: Reason for CRM: Pt stated he received a call regarding getting a flu shot but he had the flu shot during an appt with Pulmonary.

## 2018-05-07 ENCOUNTER — Encounter: Payer: Self-pay | Admitting: Internal Medicine

## 2018-05-07 ENCOUNTER — Ambulatory Visit (INDEPENDENT_AMBULATORY_CARE_PROVIDER_SITE_OTHER): Payer: 59 | Admitting: Internal Medicine

## 2018-05-07 VITALS — BP 118/60 | HR 86 | Temp 98.4°F | Ht 75.0 in | Wt 270.4 lb

## 2018-05-07 DIAGNOSIS — J449 Chronic obstructive pulmonary disease, unspecified: Secondary | ICD-10-CM | POA: Diagnosis not present

## 2018-05-07 DIAGNOSIS — R197 Diarrhea, unspecified: Secondary | ICD-10-CM

## 2018-05-07 DIAGNOSIS — J439 Emphysema, unspecified: Secondary | ICD-10-CM

## 2018-05-07 DIAGNOSIS — C73 Malignant neoplasm of thyroid gland: Secondary | ICD-10-CM

## 2018-05-07 DIAGNOSIS — R112 Nausea with vomiting, unspecified: Secondary | ICD-10-CM

## 2018-05-07 DIAGNOSIS — R6889 Other general symptoms and signs: Secondary | ICD-10-CM

## 2018-05-07 DIAGNOSIS — Z720 Tobacco use: Secondary | ICD-10-CM

## 2018-05-07 DIAGNOSIS — H6123 Impacted cerumen, bilateral: Secondary | ICD-10-CM

## 2018-05-07 LAB — POC INFLUENZA A&B (BINAX/QUICKVUE)
INFLUENZA B, POC: NEGATIVE
Influenza A, POC: NEGATIVE

## 2018-05-07 MED ORDER — DOXYCYCLINE HYCLATE 100 MG PO TABS: 100.0000 mg | ORAL_TABLET | Freq: Two times a day (BID) | ORAL | 0 refills | Status: AC

## 2018-05-07 MED ORDER — BUDESONIDE-FORMOTEROL FUMARATE 160-4.5 MCG/ACT IN AERO: 2.0000 | INHALATION_SPRAY | Freq: Two times a day (BID) | RESPIRATORY_TRACT | 12 refills | Status: AC

## 2018-05-07 MED ORDER — ONDANSETRON HCL 4 MG PO TABS: 4.0000 mg | ORAL_TABLET | Freq: Three times a day (TID) | ORAL | 0 refills | Status: AC | PRN

## 2018-05-07 NOTE — Progress Notes (Signed)
Pre visit review using our clinic review tool, if applicable. No additional management support is needed unless otherwise documented below in the visit note. 
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
## 2018-05-07 NOTE — Patient Instructions (Addendum)
You can try peptobismol otc as needed for diarrhea  As needed tylenol for fever  Bland diet bananas, broth, applesauce, toast, rice no fried foods  You can buy otc probiotics or take yogurt with probiotics as well  Increase hydration with water and low sugar gatorade Ginger ale can help with nausea as well  Try zofran for nausea  peptobismol for diarrhea   Debrox ear drops for ear wax  Robitussin DM or Mucinex DM green label   Chronic Obstructive Pulmonary Disease Chronic obstructive pulmonary disease (COPD) is a long-term (chronic) condition that affects the lungs. COPD is a general term that can be used to describe many different lung problems that cause lung swelling (inflammation) and limit airflow, including chronic bronchitis and emphysema. If you have COPD, your lung function will probably never return to normal. In most cases, it gets worse over time. However, there are steps you can take to slow the progression of the disease and improve your quality of life. What are the causes? This condition may be caused by:  Smoking. This is the most common cause.  Certain genes passed down through families.  What increases the risk? The following factors may make you more likely to develop this condition:  Secondhand smoke from cigarettes, pipes, or cigars.  Exposure to chemicals and other irritants such as fumes and dust in the work environment.  Chronic lung conditions or infections.  What are the signs or symptoms? Symptoms of this condition include:  Shortness of breath, especially during physical activity.  Chronic cough with a large amount of thick mucus. Sometimes the cough may not have any mucus (dry cough).  Wheezing.  Rapid breaths.  Gray or bluish discoloration (cyanosis) of the skin, especially in your fingers, toes, or lips.  Feeling tired (fatigue).  Weight loss.  Chest tightness.  Frequent infections.  Episodes when breathing symptoms become much worse  (exacerbations).  Swelling in the ankles, feet, or legs. This may occur in later stages of the disease.  How is this diagnosed? This condition is diagnosed based on:  Your medical history.  A physical exam.  You may also have tests, including:  Lung (pulmonary) function tests. This may include a spirometry test, which measures your ability to exhale properly.  Chest X-ray.  CT scan.  Blood tests.  How is this treated? This condition may be treated with:  Medicines. These may include inhaled rescue medicines to treat acute exacerbations as well as long-term, or maintenance, medicines to prevent flare-ups of COPD. ? Bronchodilators help treat COPD by dilating the airways to allow increased airflow and make your breathing more comfortable. ? Steroids can reduce airway inflammation and help prevent exacerbations.  Smoking cessation. If you smoke, your health care provider may ask you to quit, and may also recommend therapy or replacement products to help you quit.  Pulmonary rehabilitation. This may involve working with a team of health care providers and specialists, such as respiratory, occupational, and physical therapists.  Exercise and physical activity. These are beneficial for nearly all people with COPD.  Nutrition therapy to gain weight, if you are underweight.  Oxygen. Supplemental oxygen therapy is only helpful if you have a low oxygen level in your blood (hypoxemia).  Lung surgery or transplant.  Palliative care. This is to help people with COPD feel comfortable when treatment is no longer working.  Follow these instructions at home: Medicines  Take over-the-counter and prescription medicines (inhaled or pills) only as told by your health care provider.  Talk to your health care provider before taking any cough or allergy medicines. You may need to avoid certain medicines that dry out your airways. Lifestyle  If you are a smoker, the most important thing that  you can do is to stop smoking. Do not use any products that contain nicotine or tobacco, such as cigarettes and e-cigarettes. If you need help quitting, ask your health care provider. Continuing to smoke will cause the disease to progress faster.  Avoid exposure to things that irritate your lungs, such as smoke, chemicals, and fumes.  Stay active, but balance activity with periods of rest. Exercise and physical activity will help you maintain your ability to do things you want to do.  Learn and use relaxation techniques to manage stress and to control your breathing.  Get the right amount of sleep and get quality sleep. Most adults need 7 or more hours per night.  Eat healthy foods. Eating smaller, more frequent meals and resting before meals may help you maintain your strength. Controlled breathing Learn and use controlled breathing techniques as directed by your health care provider. Controlled breathing techniques include:  Pursed lip breathing. Start by breathing in (inhaling) through your nose for 1 second. Then, purse your lips as if you were going to whistle and breathe out (exhale) through the pursed lips for 2 seconds.  Diaphragmatic breathing. Start by putting one hand on your abdomen just above your waist. Inhale slowly through your nose. The hand on your abdomen should move out. Then purse your lips and exhale slowly. You should be able to feel the hand on your abdomen moving in as you exhale.  Controlled coughing Learn and use controlled coughing to clear mucus from your lungs. Controlled coughing is a series of short, progressive coughs. The steps of controlled coughing are: 1. Lean your head slightly forward. 2. Breathe in deeply using diaphragmatic breathing. 3. Try to hold your breath for 3 seconds. 4. Keep your mouth slightly open while coughing twice. 5. Spit any mucus out into a tissue. 6. Rest and repeat the steps once or twice as needed.  General instructions  Make  sure you receive all the vaccines that your health care provider recommends, especially the pneumococcal and influenza vaccines. Preventing infection and hospitalization is very important when you have COPD.  Use oxygen therapy and pulmonary rehabilitation if directed to by your health care provider. If you require home oxygen therapy, ask your health care provider whether you should purchase a pulse oximeter to measure your oxygen level at home.  Work with your health care provider to develop a COPD action plan. This will help you know what steps to take if your condition gets worse.  Keep other chronic health conditions under control as told by your health care provider.  Avoid extreme temperature and humidity changes.  Avoid contact with people who have an illness that spreads from person to person (is contagious), such as viral infections or pneumonia.  Keep all follow-up visits as told by your health care provider. This is important. Contact a health care provider if:  You are coughing up more mucus than usual.  There is a change in the color or thickness of your mucus.  Your breathing is more labored than usual.  Your breathing is faster than usual.  You have difficulty sleeping.  You need to use your rescue medicines or inhalers more often than expected.  You have trouble doing routine activities such as getting dressed or walking around the  house. Get help right away if:  You have shortness of breath while you are resting.  You have shortness of breath that prevents you from: ? Being able to talk. ? Performing your usual physical activities.  You have chest pain lasting longer than 5 minutes.  Your skin color is more blue (cyanotic) than usual.  You measure low oxygen saturations for longer than 5 minutes with a pulse oximeter.  You have a fever.  You feel too tired to breathe normally. Summary  Chronic obstructive pulmonary disease (COPD) is a long-term  (chronic) condition that affects the lungs.  Your lung function will probably never return to normal. In most cases, it gets worse over time. However, there are steps you can take to slow the progression of the disease and improve your quality of life.  Treatment for COPD may include taking medicines, quitting smoking, pulmonary rehabilitation, and changes to diet and exercise. As the disease progresses, you may need oxygen therapy, a lung transplant, or palliative care.  To help manage your condition, do not smoke, avoid exposure to things that irritate your lungs, stay up to date on all vaccines, and follow your health care provider's instructions for taking medicines. This information is not intended to replace advice given to you by your health care provider. Make sure you discuss any questions you have with your health care provider. Document Released: 02/26/2005 Document Revised: 06/23/2016 Document Reviewed: 06/23/2016 Elsevier Interactive Patient Education  2018 [place] Choices to Help Relieve Diarrhea, Adult When you have diarrhea, the foods you eat and your eating habits are very important. Choosing the right foods and drinks can help:  Relieve diarrhea.  Replace lost fluids and nutrients.  Prevent dehydration.  What general guidelines should I follow? Relieving diarrhea  Choose foods with less than 2 g or .07 oz. of fiber per serving.  Limit fats to less than 8 tsp (38 g or 1.34 oz.) a day.  Avoid the following: ? Foods and beverages sweetened with high-fructose corn syrup, honey, or sugar alcohols such as xylitol, sorbitol, and mannitol. ? Foods that contain a lot of fat or sugar. ? Fried, greasy, or spicy foods. ? High-fiber grains, breads, and cereals. ? Raw fruits and vegetables.  Eat foods that are rich in probiotics. These foods include dairy products such as yogurt and fermented milk products. They help increase healthy bacteria in the stomach and  intestines (gastrointestinal tract, or GI tract).  If you have lactose intolerance, avoid dairy products. These may make your diarrhea worse.  Take medicine to help stop diarrhea (antidiarrheal medicine) only as told by your health care provider. Replacing nutrients  Eat small meals or snacks every 3-4 hours.  Eat bland foods, such as white rice, toast, or baked potato, until your diarrhea starts to get better. Gradually reintroduce nutrient-rich foods as tolerated or as told by your health care provider. This includes: ? Well-cooked protein foods. ? Peeled, seeded, and soft-cooked fruits and vegetables. ? Low-fat dairy products.  Take vitamin and mineral supplements as told by your health care provider. Preventing dehydration   Start by sipping water or a special solution to prevent dehydration (oral rehydration solution, ORS). Urine that is clear or pale yellow means that you are getting enough fluid.  Try to drink at least 8-10 cups of fluid each day to help replace lost fluids.  You may add other liquids in addition to water, such as clear juice or decaffeinated sports drinks, as tolerated  or as told by your health care provider.  Avoid drinks with caffeine, such as coffee, tea, or soft drinks.  Avoid alcohol. What foods are recommended? The items listed may not be a complete list. Talk with your health care provider about what dietary choices are best for you. Grains White rice. White, [place], or pita breads (fresh or toasted), including plain rolls, buns, or bagels. White pasta. Saltine, soda, or graham crackers. Pretzels. Low-fiber cereal. Cooked cereals made with water (such as cornmeal, farina, or cream cereals). Plain muffins. Matzo. Melba toast. Zwieback. Vegetables Potatoes (without the skin). Most well-cooked and canned vegetables without skins or seeds. Tender lettuce. Fruits Apple sauce. Fruits canned in juice. Cooked apricots, cherries, grapefruit, peaches, pears, or  plums. Fresh bananas and cantaloupe. Meats and other protein foods Baked or boiled chicken. Eggs. Tofu. Fish. Seafood. Smooth nut butters. Ground or well-cooked tender beef, ham, veal, lamb, pork, or poultry. Dairy Plain yogurt, kefir, and unsweetened liquid yogurt. Lactose-free milk, buttermilk, skim milk, or soy milk. Low-fat or nonfat hard cheese. Beverages Water. Low-calorie sports drinks. Fruit juices without pulp. Strained tomato and vegetable juices. Decaffeinated teas. Sugar-free beverages not sweetened with sugar alcohols. Oral rehydration solutions, if approved by your health care provider. Seasoning and other foods Bouillon, broth, or soups made from recommended foods. What foods are not recommended? The items listed may not be a complete list. Talk with your health care provider about what dietary choices are best for you. Grains Whole grain, whole wheat, bran, or rye breads, rolls, pastas, and crackers. Wild or brown rice. Whole grain or bran cereals. Barley. Oats and oatmeal. Corn tortillas or taco shells. Granola. Popcorn. Vegetables Raw vegetables. Fried vegetables. Cabbage, broccoli, Brussels sprouts, artichokes, baked beans, beet greens, corn, kale, legumes, peas, sweet potatoes, and yams. Potato skins. Cooked spinach and cabbage. Fruits Dried fruit, including raisins and dates. Raw fruits. Stewed or dried prunes. Canned fruits with syrup. Meat and other protein foods Fried or fatty meats. Deli meats. Chunky nut butters. Nuts and seeds. Beans and lentils. Berniece Salines. Hot dogs. Sausage. Dairy High-fat cheeses. Whole milk, chocolate milk, and beverages made with milk, such as milk shakes. Half-and-half. Cream. sour cream. Ice cream. Beverages Caffeinated beverages (such as coffee, tea, soda, or energy drinks). Alcoholic beverages. Fruit juices with pulp. Prune juice. Soft drinks sweetened with high-fructose corn syrup or sugar alcohols. High-calorie sports drinks. Fats and  oils Butter. Cream sauces. Margarine. Salad oils. Plain salad dressings. Olives. Avocados. Mayonnaise. Sweets and desserts Sweet rolls, doughnuts, and sweet breads. Sugar-free desserts sweetened with sugar alcohols such as xylitol and sorbitol. Seasoning and other foods Honey. Hot sauce. Chili powder. Gravy. Cream-based or milk-based soups. Pancakes and waffles. Summary  When you have diarrhea, the foods you eat and your eating habits are very important.  Make sure you get at least 8-10 cups of fluid each day, or enough to keep your urine clear or pale yellow.  Eat bland foods and gradually reintroduce healthy, nutrient-rich foods as tolerated, or as told by your health care provider.  Avoid high-fiber, fried, greasy, or spicy foods. This information is not intended to replace advice given to you by your health care provider. Make sure you discuss any questions you have with your health care provider. Document Released: 08/09/2003 Document Revised: 05/16/2016 Document Reviewed: 05/16/2016 Elsevier Interactive Patient Education  [employer].

## 2018-05-10 ENCOUNTER — Telehealth: Payer: Self-pay | Admitting: Internal Medicine

## 2018-05-10 DIAGNOSIS — J439 Emphysema, unspecified: Secondary | ICD-10-CM | POA: Diagnosis not present

## 2018-05-10 MED ORDER — ALBUTEROL SULFATE (2.5 MG/3ML) 0.083% IN NEBU
2.5000 mg | INHALATION_SOLUTION | Freq: Once | RESPIRATORY_TRACT | Status: AC
  Administered 2018-05-10: 2.5 mg via RESPIRATORY_TRACT

## 2018-05-10 MED ORDER — IPRATROPIUM BROMIDE 0.02 % IN SOLN
0.5000 mg | Freq: Once | RESPIRATORY_TRACT | Status: AC
  Administered 2018-05-10: 0.5 mg via RESPIRATORY_TRACT

## 2018-05-10 NOTE — Telephone Encounter (Signed)
[facility] dropped off death certificate for pt Please contact them when completed [phone] Placed in Dr. Claris Gladden color folder upfront

## 2018-05-10 NOTE — Telephone Encounter (Signed)
death certificate has been placed on provider desk

## 2018-05-10 NOTE — Addendum Note (Signed)
Addended by: Elpidio Galea T on: 05/10/2018 01:57 PM   Modules accepted: Orders

## 2018-05-11 NOTE — Telephone Encounter (Signed)
Patients sister in law and brother called in and wanted to know if there is anyway someone can contact medical examiner to see if a blood draw can be done.   CB# [phone]

## 2018-05-11 NOTE — Telephone Encounter (Signed)
How do we change status to deceased in the chart?   [place]

## 2018-05-11 NOTE — Telephone Encounter (Signed)
Please advise 

## 2018-05-11 NOTE — Telephone Encounter (Signed)
[name] calling to check on this.  States that it is for a cremation and death certificate needs to be signed for them to proceed.

## 2018-05-11 NOTE — Telephone Encounter (Signed)
Had the medical examiner Vonna Kotyk [phone] call the family to disc Will fill out death certificate   [place]

## 2018-05-12 ENCOUNTER — Telehealth: Payer: Self-pay | Admitting: *Deleted

## 2018-05-12 ENCOUNTER — Telehealth: Payer: Self-pay | Admitting: Internal Medicine

## 2018-05-12 NOTE — Telephone Encounter (Signed)
Left message for patient to return call back. [facility] may give information.  

## 2018-05-12 NOTE — Telephone Encounter (Signed)
Death Certificate was picked up.

## 2018-05-12 NOTE — Telephone Encounter (Signed)
Under the demographics I can not change only a provider.

## 2018-05-12 NOTE — Telephone Encounter (Signed)
Copied from [place] [phone]. Topic: Quick Communication - See Telephone Encounter >> May 12, 2018 12:28 PM Hewitt Shorts wrote: Pt sister Caren Griffins christine turner called stating that she received a call from [place] paying her condolences on the death of the patient -the sister knows that she is not on the hippa form but since she  Talked with her yesterday in regards to labs that the funeral home is needing to draw and those results -best number for ms turner is [phone]  pt passed away 09/13/22

## 2018-05-12 NOTE — Telephone Encounter (Signed)
Spoke with medical examiner Vonna Kotyk given medication history this does not seem like a medical examiner case and they could hire private medical examiner own their own and contact them and schedule appt for examination autopsy cost could be [phone],000 out of pocket before cremation is done. Per medical examiner closest one is in [facility]    [employer]

## 2018-05-12 NOTE — Telephone Encounter (Signed)
I wouldn't order labs this would be medical examiner give pts sister # to medical examiner   [place]

## 2018-05-13 ENCOUNTER — Telehealth: Payer: Self-pay

## 2018-05-13 ENCOUNTER — Telehealth: Payer: Self-pay | Admitting: Internal Medicine

## 2018-05-13 NOTE — Telephone Encounter (Signed)
Spoke with sister Mrs. Turner expressed condolences. Family does not wish to pursue autopsy or bloodwork for cause of death. Pt will be cremated  [place]

## 2018-05-13 NOTE — Telephone Encounter (Signed)
Copied from [place] [id number]. Topic: General - Other >> May 12, 2018 12:21 PM Leward Quan A wrote: Reason for CRM: Patient Sister in [place] called to say that she was notified by the [facility] that Dr Olivia Mackie had ordered a blood draw from patient before cremation. She wanted to get clarification if this was true or not. Also she wanted to know if this blood could be checked to confirm patients cause of death. Trying to determine if he had a heart attack, drug induced, or was there any type of poison in his system. She stated that the family is very concerned and need some answers please. Cb# [phone]

## 2018-05-13 NOTE — Telephone Encounter (Signed)
I have already discussed this with sister Lonn [place] listed Emergency Contact  -if we do not do autopsy unsure cause of death. Sister did not want to do further testing discussed with medical examiner.   I didn't order further blood testing discussed with sister in law Royal [place] and sister Altha Harm did not want to do further testing  [place]

## 2018-05-14 ENCOUNTER — Ambulatory Visit: Payer: 59 | Admitting: Physician Assistant

## 2018-05-14 ENCOUNTER — Encounter

## 2018-06-02 DEATH — deceased

## 2020-01-20 IMAGING — CT CT CHEST W/O CM
2 of 4 series · 15 of 36 positions shown, 18 images · non-contrast
Comparison: CT scan of August 11, 2017.

CLINICAL DATA: Pneumonia, acute respiratory illness.

EXAM:
CT CHEST WITHOUT CONTRAST
TECHNIQUE: Multidetector CT imaging of the chest was performed following the
standard protocol without IV contrast.

[Series 2: chest · axial · 0.74mm/px · z∈[-1367,-1065]mm · 12 of 179 slices shown, 15 images (1 of 2)]
[im 14/179  mediastinal]
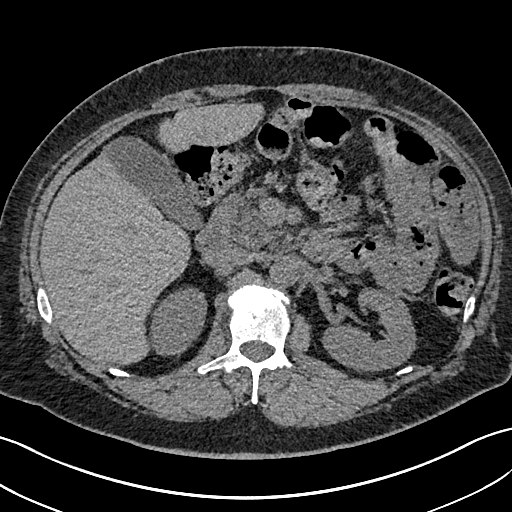
[im 14/179  lung]
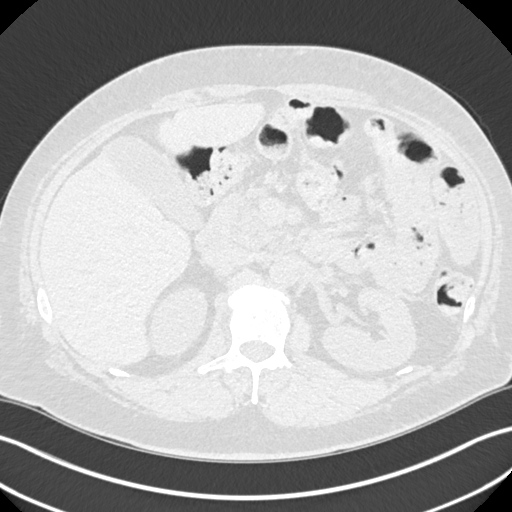
[im 28/179  lung]
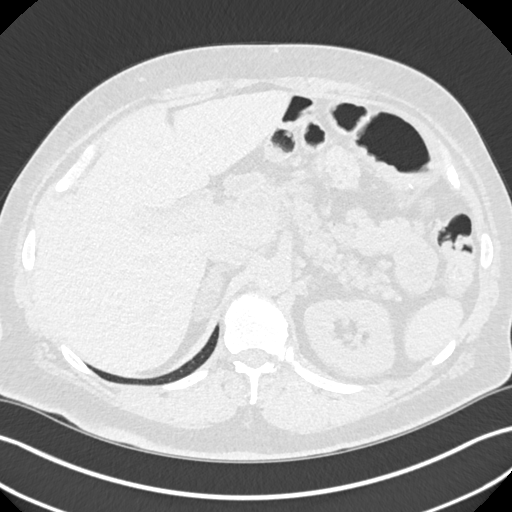
[im 42/179  lung]
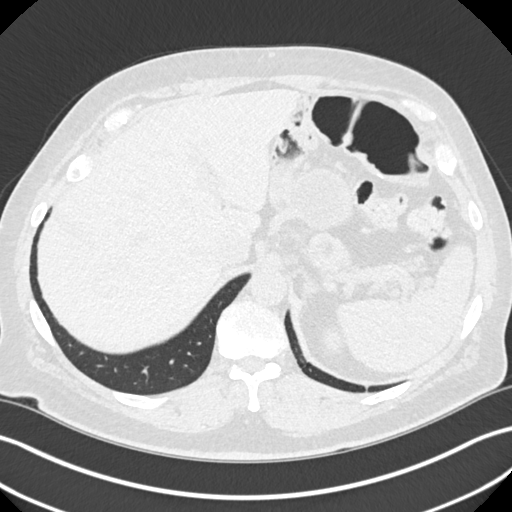
[im 55/179  lung]
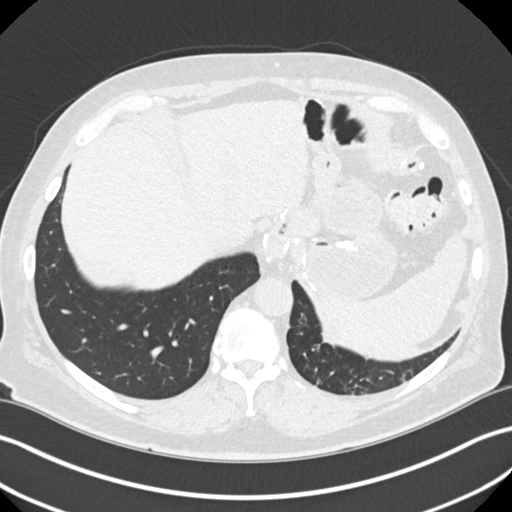
[im 69/179  mediastinal]
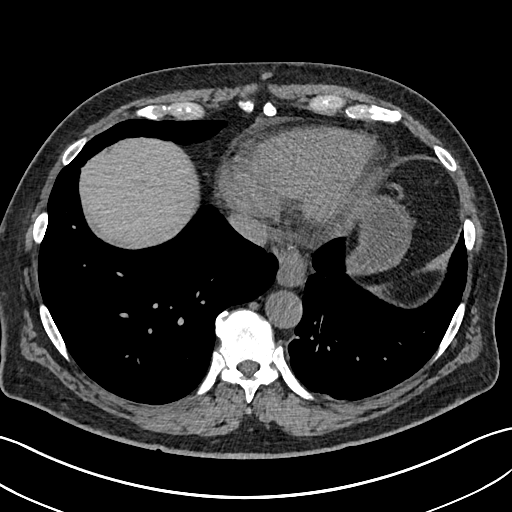
[im 69/179  lung]
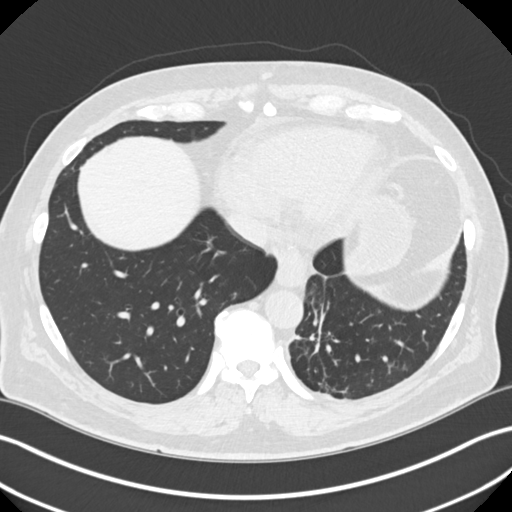
[im 83/179  lung]
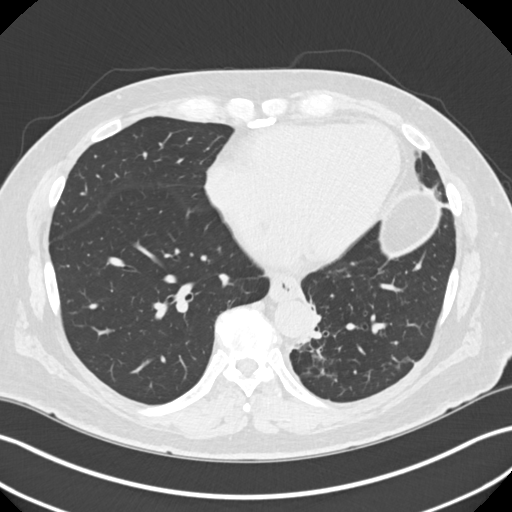
[im 96/179  lung]
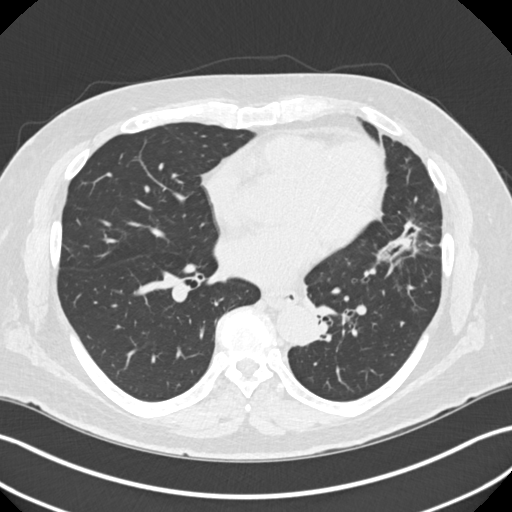
[im 110/179  lung]
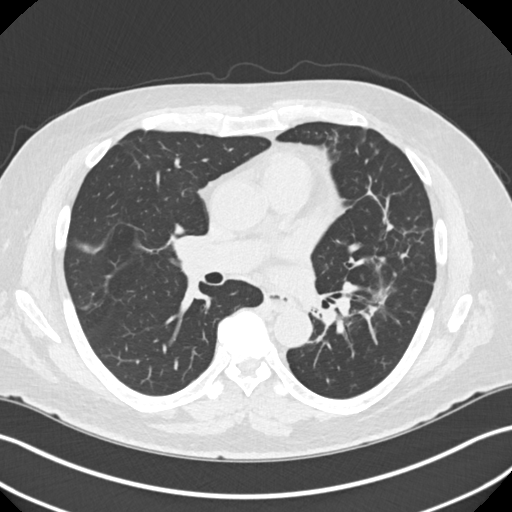
[im 124/179  mediastinal]
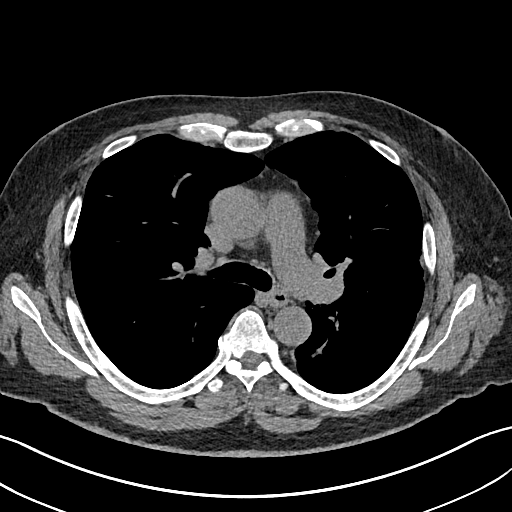
[im 124/179  lung]
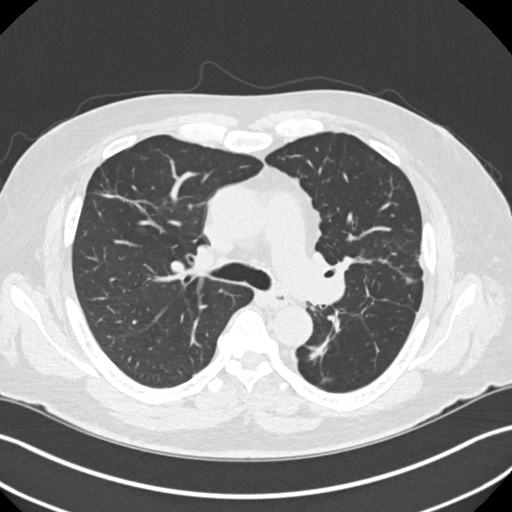
[im 137/179  lung]
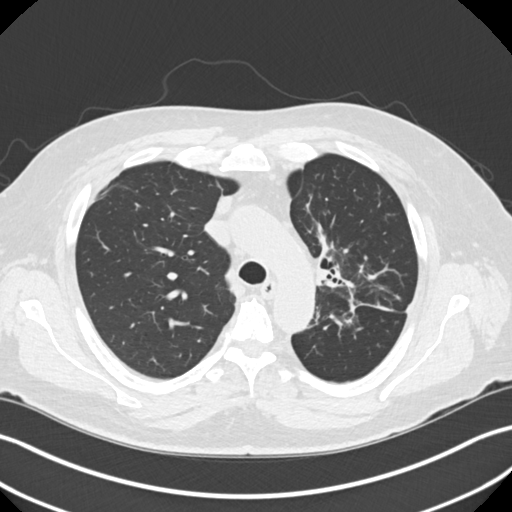
[im 151/179  lung]
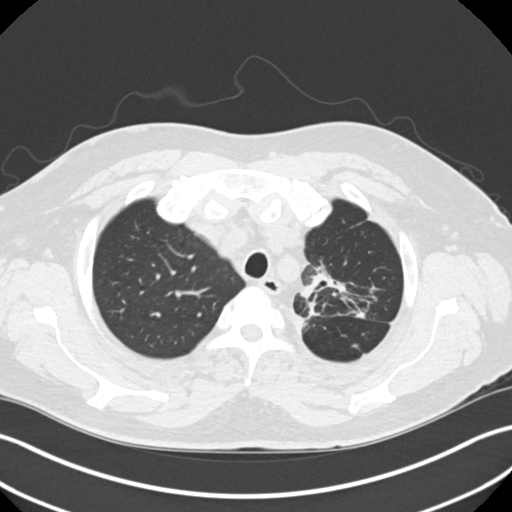
[im 165/179  lung]
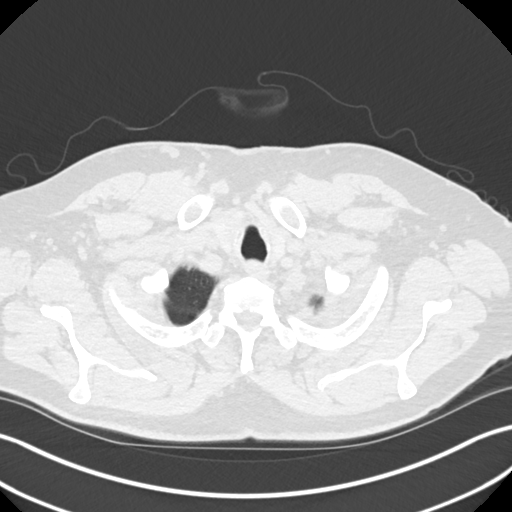

[Series 5: chest · coronal · 0.70mm/px · 3 of 154 slices shown (2 of 2)]
[im 31/154  lung]
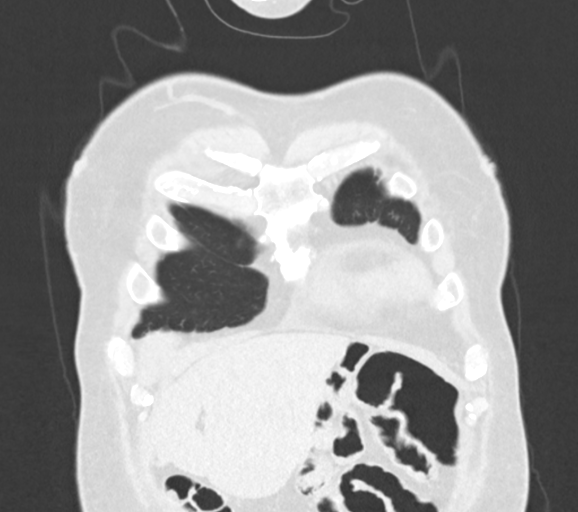
[im 62/154  lung]
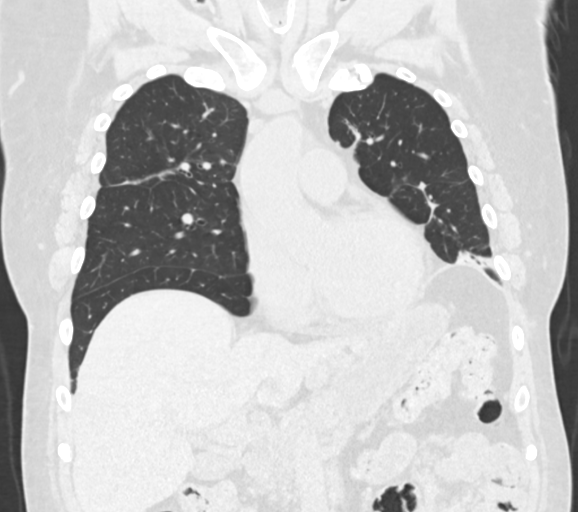
[im 92/154  lung]
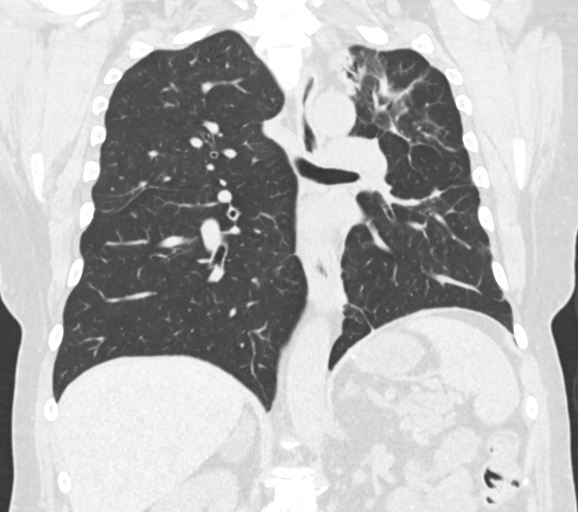

[15 of 36 positions shown; findings below may reference images not displayed]

FINDINGS: Cardiovascular: No significant vascular findings. Normal heart size.
No pericardial effusion.

Mediastinum/Nodes: 2.1 cm partially calcified nodule is again noted
in the right thyroid lobe. Small sliding-type hiatal hernia is noted
with postsurgical changes seen at the gastroesophageal junction. No
significant mediastinal adenopathy is noted.

Lungs/Pleura: No pneumothorax or pleural effusion is noted. Large
left lung opacity noted on prior exam has resolved. Probable
postinfectious scarring is seen in the left upper and lower lobes.
Stable scarring is noted in the right upper lobe. 7 mm nodular
density is noted in left upper lobe which may represent scarring,
but follow-up is recommended.

Upper Abdomen: No acute abnormality.  Stable right adrenal adenoma.

Musculoskeletal: No chest wall mass or suspicious bone lesions
identified.
IMPRESSION: Large left-sided pneumonia noted on prior exam has resolved, with
probable residual postinfectious scarring seen in the left upper and
lower lobes. Stable right upper lobe scarring is noted.

7 mm nodular density is noted in the left upper lobe which may
represent scarring. Non-contrast chest CT at 6-12 months is
recommended. If the nodule is stable at time of repeat CT, then
future CT at 18-24 months (from today's scan) is considered optional
for low-risk patients, but is recommended for high-risk patients.
This recommendation follows the consensus statement: Guidelines for
Management of Incidental Pulmonary Nodules Detected on CT Images:

2.1 cm partially calcified right thyroid nodule is noted. Thyroid
ultrasound is recommended for further evaluation.
